# Patient Record
Sex: Female | Born: 1945 | Race: Black or African American | Hispanic: No | Smoking: Never smoker
Health system: Southern US, Community
[De-identification: ages and names within clinical notes are randomized; demographics above are authoritative.]

## PROBLEM LIST (undated history)

## (undated) DIAGNOSIS — J45909 Unspecified asthma, uncomplicated: Secondary | ICD-10-CM

## (undated) DIAGNOSIS — N184 Chronic kidney disease, stage 4 (severe): Secondary | ICD-10-CM

## (undated) DIAGNOSIS — J019 Acute sinusitis, unspecified: Secondary | ICD-10-CM

## (undated) DIAGNOSIS — I10 Essential (primary) hypertension: Secondary | ICD-10-CM

## (undated) DIAGNOSIS — M1990 Unspecified osteoarthritis, unspecified site: Secondary | ICD-10-CM

## (undated) DIAGNOSIS — I214 Non-ST elevation (NSTEMI) myocardial infarction: Secondary | ICD-10-CM

## (undated) DIAGNOSIS — J309 Allergic rhinitis, unspecified: Secondary | ICD-10-CM

## (undated) DIAGNOSIS — E119 Type 2 diabetes mellitus without complications: Secondary | ICD-10-CM

## (undated) DIAGNOSIS — K219 Gastro-esophageal reflux disease without esophagitis: Secondary | ICD-10-CM

## (undated) DIAGNOSIS — K760 Fatty (change of) liver, not elsewhere classified: Secondary | ICD-10-CM

## (undated) HISTORY — DX: Gastro-esophageal reflux disease without esophagitis: K21.9

## (undated) HISTORY — PX: ABDOMINAL HYSTERECTOMY: SHX81

## (undated) HISTORY — PX: HAND SURGERY: SHX662

## (undated) HISTORY — PX: BACK SURGERY: SHX140

## (undated) HISTORY — PX: BREAST SURGERY: SHX581

## (undated) HISTORY — DX: Acute sinusitis, unspecified: J01.90

## (undated) HISTORY — DX: Unspecified asthma, uncomplicated: J45.909

## (undated) HISTORY — DX: Allergic rhinitis, unspecified: J30.9

---

## 1998-07-31 ENCOUNTER — Other Ambulatory Visit: Admission: RE | Admit: 1998-07-31 | Discharge: 1998-07-31 | Payer: Self-pay | Admitting: *Deleted

## 1999-03-01 ENCOUNTER — Emergency Department (HOSPITAL_COMMUNITY): Admission: EM | Admit: 1999-03-01 | Discharge: 1999-03-02 | Payer: Self-pay | Admitting: Emergency Medicine

## 1999-06-23 ENCOUNTER — Other Ambulatory Visit: Admission: RE | Admit: 1999-06-23 | Discharge: 1999-06-23 | Payer: Self-pay | Admitting: *Deleted

## 1999-07-13 ENCOUNTER — Encounter: Admission: RE | Admit: 1999-07-13 | Discharge: 1999-07-13 | Payer: Self-pay | Admitting: Family Medicine

## 1999-07-13 ENCOUNTER — Encounter: Payer: Self-pay | Admitting: Family Medicine

## 1999-12-21 ENCOUNTER — Ambulatory Visit (HOSPITAL_COMMUNITY): Admission: RE | Admit: 1999-12-21 | Discharge: 1999-12-21 | Payer: Self-pay | Admitting: Neurosurgery

## 1999-12-21 ENCOUNTER — Encounter: Payer: Self-pay | Admitting: Neurosurgery

## 2000-07-14 ENCOUNTER — Other Ambulatory Visit: Admission: RE | Admit: 2000-07-14 | Discharge: 2000-07-14 | Payer: Self-pay | Admitting: *Deleted

## 2000-07-18 ENCOUNTER — Other Ambulatory Visit: Admission: RE | Admit: 2000-07-18 | Discharge: 2000-07-18 | Payer: Self-pay | Admitting: Orthopedic Surgery

## 2001-01-20 ENCOUNTER — Inpatient Hospital Stay (HOSPITAL_COMMUNITY): Admission: AD | Admit: 2001-01-20 | Discharge: 2001-01-22 | Payer: Self-pay | Admitting: *Deleted

## 2001-02-20 ENCOUNTER — Encounter: Admission: RE | Admit: 2001-02-20 | Discharge: 2001-05-21 | Payer: Self-pay | Admitting: Family Medicine

## 2002-03-28 ENCOUNTER — Emergency Department (HOSPITAL_COMMUNITY): Admission: EM | Admit: 2002-03-28 | Discharge: 2002-03-28 | Payer: Self-pay | Admitting: Emergency Medicine

## 2002-07-05 ENCOUNTER — Encounter: Admission: RE | Admit: 2002-07-05 | Discharge: 2002-10-03 | Payer: Self-pay | Admitting: Family Medicine

## 2002-07-24 ENCOUNTER — Encounter: Admission: RE | Admit: 2002-07-24 | Discharge: 2002-07-24 | Payer: Self-pay | Admitting: Neurosurgery

## 2002-07-24 ENCOUNTER — Encounter: Payer: Self-pay | Admitting: Neurosurgery

## 2003-03-21 ENCOUNTER — Emergency Department (HOSPITAL_COMMUNITY): Admission: EM | Admit: 2003-03-21 | Discharge: 2003-03-22 | Payer: Self-pay | Admitting: Emergency Medicine

## 2003-07-08 ENCOUNTER — Ambulatory Visit (HOSPITAL_COMMUNITY): Admission: RE | Admit: 2003-07-08 | Discharge: 2003-07-08 | Payer: Self-pay | Admitting: Specialist

## 2003-07-08 ENCOUNTER — Encounter (INDEPENDENT_AMBULATORY_CARE_PROVIDER_SITE_OTHER): Payer: Self-pay | Admitting: *Deleted

## 2003-07-08 ENCOUNTER — Ambulatory Visit (HOSPITAL_BASED_OUTPATIENT_CLINIC_OR_DEPARTMENT_OTHER): Admission: RE | Admit: 2003-07-08 | Discharge: 2003-07-08 | Payer: Self-pay | Admitting: Specialist

## 2004-02-02 HISTORY — PX: BREAST REDUCTION SURGERY: SHX8

## 2004-04-30 ENCOUNTER — Ambulatory Visit: Payer: Self-pay | Admitting: Pulmonary Disease

## 2004-11-23 ENCOUNTER — Encounter: Admission: RE | Admit: 2004-11-23 | Discharge: 2004-11-23 | Payer: Self-pay | Admitting: *Deleted

## 2004-11-27 ENCOUNTER — Ambulatory Visit (HOSPITAL_COMMUNITY): Admission: RE | Admit: 2004-11-27 | Discharge: 2004-11-27 | Payer: Self-pay | Admitting: *Deleted

## 2004-11-27 ENCOUNTER — Ambulatory Visit (HOSPITAL_BASED_OUTPATIENT_CLINIC_OR_DEPARTMENT_OTHER): Admission: RE | Admit: 2004-11-27 | Discharge: 2004-11-27 | Payer: Self-pay | Admitting: *Deleted

## 2004-11-27 ENCOUNTER — Encounter (INDEPENDENT_AMBULATORY_CARE_PROVIDER_SITE_OTHER): Payer: Self-pay | Admitting: *Deleted

## 2005-05-18 ENCOUNTER — Ambulatory Visit: Payer: Self-pay | Admitting: Internal Medicine

## 2005-06-02 ENCOUNTER — Ambulatory Visit: Payer: Self-pay | Admitting: Internal Medicine

## 2005-07-19 ENCOUNTER — Ambulatory Visit: Payer: Self-pay | Admitting: Internal Medicine

## 2005-08-12 ENCOUNTER — Ambulatory Visit: Payer: Self-pay | Admitting: Internal Medicine

## 2005-11-16 ENCOUNTER — Ambulatory Visit: Payer: Self-pay | Admitting: Internal Medicine

## 2006-02-07 ENCOUNTER — Ambulatory Visit: Payer: Self-pay | Admitting: Internal Medicine

## 2006-04-26 ENCOUNTER — Ambulatory Visit: Payer: Self-pay | Admitting: Internal Medicine

## 2006-05-09 ENCOUNTER — Ambulatory Visit: Payer: Self-pay | Admitting: Internal Medicine

## 2006-05-12 ENCOUNTER — Inpatient Hospital Stay (HOSPITAL_COMMUNITY): Admission: EM | Admit: 2006-05-12 | Discharge: 2006-05-18 | Payer: Self-pay | Admitting: Emergency Medicine

## 2006-06-13 ENCOUNTER — Ambulatory Visit: Payer: Self-pay | Admitting: Internal Medicine

## 2006-11-22 ENCOUNTER — Ambulatory Visit: Payer: Self-pay | Admitting: Pulmonary Disease

## 2006-12-26 ENCOUNTER — Ambulatory Visit: Payer: Self-pay | Admitting: Internal Medicine

## 2007-02-23 DIAGNOSIS — E119 Type 2 diabetes mellitus without complications: Secondary | ICD-10-CM

## 2007-02-23 DIAGNOSIS — J309 Allergic rhinitis, unspecified: Secondary | ICD-10-CM | POA: Insufficient documentation

## 2007-02-23 DIAGNOSIS — K219 Gastro-esophageal reflux disease without esophagitis: Secondary | ICD-10-CM

## 2007-02-23 DIAGNOSIS — Z794 Long term (current) use of insulin: Secondary | ICD-10-CM

## 2007-02-23 DIAGNOSIS — J45909 Unspecified asthma, uncomplicated: Secondary | ICD-10-CM | POA: Insufficient documentation

## 2007-02-24 ENCOUNTER — Ambulatory Visit: Payer: Self-pay | Admitting: Internal Medicine

## 2007-04-24 ENCOUNTER — Ambulatory Visit: Payer: Self-pay | Admitting: Internal Medicine

## 2007-05-05 ENCOUNTER — Ambulatory Visit: Payer: Self-pay | Admitting: Internal Medicine

## 2007-05-10 ENCOUNTER — Telehealth: Payer: Self-pay | Admitting: Internal Medicine

## 2007-07-13 ENCOUNTER — Emergency Department (HOSPITAL_COMMUNITY): Admission: EM | Admit: 2007-07-13 | Discharge: 2007-07-13 | Payer: Self-pay | Admitting: Emergency Medicine

## 2008-02-06 ENCOUNTER — Encounter: Admission: RE | Admit: 2008-02-06 | Discharge: 2008-02-06 | Payer: Self-pay | Admitting: Endocrinology

## 2008-04-01 ENCOUNTER — Ambulatory Visit: Payer: Self-pay | Admitting: Internal Medicine

## 2008-04-01 DIAGNOSIS — J019 Acute sinusitis, unspecified: Secondary | ICD-10-CM | POA: Insufficient documentation

## 2008-05-31 ENCOUNTER — Ambulatory Visit: Payer: Self-pay | Admitting: Internal Medicine

## 2008-06-04 ENCOUNTER — Encounter: Admission: RE | Admit: 2008-06-04 | Discharge: 2008-06-04 | Payer: Self-pay | Admitting: Orthopedic Surgery

## 2008-06-19 ENCOUNTER — Encounter: Admission: RE | Admit: 2008-06-19 | Discharge: 2008-06-19 | Payer: Self-pay | Admitting: Neurosurgery

## 2008-12-05 ENCOUNTER — Ambulatory Visit: Payer: Self-pay | Admitting: Internal Medicine

## 2009-04-26 ENCOUNTER — Emergency Department (HOSPITAL_COMMUNITY): Admission: EM | Admit: 2009-04-26 | Discharge: 2009-04-26 | Payer: Self-pay | Admitting: Emergency Medicine

## 2010-04-24 LAB — POCT I-STAT, CHEM 8
Calcium, Ion: 1.22 mmol/L (ref 1.12–1.32)
Creatinine, Ser: 1.4 mg/dL — ABNORMAL HIGH (ref 0.4–1.2)
Glucose, Bld: 105 mg/dL — ABNORMAL HIGH (ref 70–99)
HCT: 40 % (ref 36.0–46.0)
Hemoglobin: 13.6 g/dL (ref 12.0–15.0)
TCO2: 30 mmol/L (ref 0–100)

## 2010-04-24 LAB — URINALYSIS, ROUTINE W REFLEX MICROSCOPIC
Bilirubin Urine: NEGATIVE
Ketones, ur: 15 mg/dL — AB
Nitrite: NEGATIVE
Protein, ur: NEGATIVE mg/dL
Specific Gravity, Urine: 1.029 (ref 1.005–1.030)
Urobilinogen, UA: 1 mg/dL (ref 0.0–1.0)

## 2010-06-16 NOTE — Assessment & Plan Note (Signed)
[facility]                             PULMONARY OFFICE NOTE   Heidi, Campbell                       MRN:          [id number]  DATE:06/13/2006                            DOB:          1945-08-26    PROBLEMS:  1. Asthma.  2. Allergic rhinitis.  3. Diabetes.   HISTORY:  She had to be hospitalized on April 10th through 16th by the  [facility] hospitalists for exacerbation of asthma and uncontrolled  diabetes.  She was discharged on a prednisone taper, Avelox 400 mg for 5  days, albuterol/Advair 250/50, Cardizem 300 mg, Glucophage 500 mg  b.i.d., Amaryl 4 mg, Lantus 25 units b.i.d., NovoLog 4 units with meals  t.i.d., aspirin 81 mg.  We have no medication allergies listed.   Chest x-ray from April 10 showed no acute disease.   She says she is still somewhat short of breath with exertion, as she has  finished her prednisone taper.  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   OBJECTIVE:  VITAL SIGNS:  Weight 296 pounds.  BP 156/86, pulse 66, room  air saturation 100%.  GENERAL:  She is obese.  CARDIAC:  Heart sounds are regular without murmur.  LUNGS:  Lung fields sound quite clear but with deep breath, she started  coughing.  There was no edema.   IMPRESSION:  Asthma exacerbation, unstable.   PLAN:  1. We are giving her a sample of Advair/500/50 to use for two weeks,      then drop back to her 250/50.  2. Reflux is a possible trigger for her asthma, and we added Zegerid      40/1100 1 daily.  3. Scheduling return in one month but earlier p.r.n.  4. We have discussed acute care and asthma action management.     Clinton D. Maple Hudson, MD, FCCP, FACP     CDY/MedQ  DD: 06/16/2006  DT: 06/17/2006  Job #: [id number]   cc:   Gretta Arab. Valentina Lucks, M.D.

## 2010-06-16 NOTE — Assessment & Plan Note (Signed)
[facility]                             PULMONARY OFFICE NOTE   Heidi Campbell, Heidi Campbell                       MRN:          [id number]  DATE:11/22/2006                            DOB:          02-Aug-1945    HISTORY OF PRESENT ILLNESS:  The patient is a pleasant female patient of  Dr. Roxy Cedar who has a know history of asthma and allergic rhinitis who  presents today for a 3-day history of dry cough, wheezing, hoarseness  and sore throat.  The patient denies any hemoptysis, orthopnea, PND,  purulent sputum.   PAST MEDICAL HISTORY:  Reviewed.   CURRENT MEDICATIONS:  Reviewed.   PHYSICAL EXAMINATION:  GENERAL:  The patient is a pleasant female in no  acute distress.  VITAL SIGNS:  Temperature 99.5, blood pressure 152/84, O2 saturation 99%  on room air.  HEENT:  Negative mucosa.  Nontender sinuses.  Posterior pharynx is  clear.  NECK:  Supple without adenopathy.  Negative nuchal rigidity.  LUNGS:  Lung sounds reveal coarse breath sounds without any wheezing.  The patient does have some upper airway pseudowheezing.  CARDIAC:  Regular rate.  ABDOMEN:  Morbid obesity, soft and nontender.  EXTREMITIES:  Warm without clubbing, cyanosis, or edema.  SKIN:  Warm without rash.   IMPRESSION AND PLAN:  Acute asthmatic bronchitic exacerbation and  rhinitis flare.  The patient is to begin Mucinex DM twice daily.  Xopenex nebulized treatment was given today in the office.  Tussionex,  #4 ounces, one teaspoon every 12 hours as needed for cough.  The patient  aware of sedating affect.  The patient has been recommended to use  Allegra twice daily as needed for postnasal drip symptoms and is to  begin Prilosec 20 mg daily x2 weeks for any residual reflux that could  be irritating the airways.  The patient will follow back up with Dr.  Maple Hudson in 1 month as scheduled, or sooner if needed.  The patient is  advised to contact our office if symptoms do not improve or  worsen.      Rubye Oaks, NP  Electronically Signed      Clinton D. Maple Hudson, MD, Tonny Bollman, FACP  Electronically Signed   TP/MedQ  DD: 11/22/2006  DT: 11/22/2006  Job #: [id number]

## 2010-06-16 NOTE — Assessment & Plan Note (Signed)
[facility]                             PULMONARY OFFICE NOTE   Heidi Campbell, Heidi Campbell                       MRN:          [id number]  DATE:12/26/2006                            DOB:          Dec 04, 1945    PROBLEMS:  1. Asthma.  2. Allergic rhinitis.  3. Diabetes.  4. Esophageal reflux.   HISTORY:  She saw the nurse practitioner on October 21 with an acute  asthmatic bronchitis and rhinitis flare, and she has felt much better  since that visit. She thinks that she is doing well. She is continuing  to use Advair 500/50, except that she notices a little dry cough,  sometimes getting a bit raspy. She has had flu vaccine.   MEDICATIONS:  We reviewed her medication list, which is charted.  Significant now for:  1. Advair 500/50.  2. Singulair 10 mg.  3. Albuterol rescue inhaler.  4. Allegra 60 mg occasional.   ALLERGIES:  No medication allergy.   OBJECTIVE:  Blood pressure 188/100 on first check, 172/94 on second  check. Pulse regular 59, room air saturation 100%. Throat tickle type  cough demonstrated. No strider. Quiet chest without wheeze, rales, or  rhonchi. Heart sounds regular without murmur. Moderate nasal congestion  with no post-nasal drip. No edema. No adenopathy.   IMPRESSION:  Dry residual cough may still reflect some asthmatic  bronchitis. We are not going to add more medicines for this currently.  She does have a persistent rhinitis and we are going to try a nasal  antihistamine spray. I expressed concern about her blood pressure and  directed her to have that evaluated by her primary care physician.   PLAN:  1. She was given reminder instruction on the use of an albuterol      rescue inhaler.  2. Continue Advair 500/50 with careful mouth care.  3. Try Patinase nasal spray, once or twice in each nostril, b.i.d.      p.r.n.  4. Schedule return 6 months, earlier p.r.n.    Clinton D. Maple Hudson, MD, Tonny Bollman, FACP  Electronically  Signed   CDY/MedQ  DD: 01/01/2007  DT: 01/02/2007  Job #: [id number]   cc:   Gretta Arab. Valentina Lucks, M.D.

## 2010-06-19 NOTE — Op Note (Signed)
NAME:  Heidi Campbell, Heidi Campbell                ACCOUNT NO.:  [id number]   MEDICAL RECORD NO.:  [id number]          PATIENT TYPE:  AMB   LOCATION:  [facility]                         FACILITY:  [facility]   PHYSICIAN:  Pershing Cox, M.D.DATE OF BIRTH:  08-18-1945   DATE OF PROCEDURE:  11/27/2004  DATE OF DISCHARGE:                                 OPERATIVE REPORT   PREOPERATIVE DIAGNOSIS:  Vulvar intraepithelial neoplasia, high-grade.   POSTOPERATIVE DIAGNOSIS:  Vulvar intraepithelial neoplasia, high-grade.   PROCEDURE:  Right hemivulvectomy, wide local excision of vulvar lesion with  primary closure.   SURGEON:  Pershing Cox, M.D.   ANESTHESIA:  General with LMA and Marcaine 0.5% local.   ASSISTANT:  No assistant for this case.   HISTORY:  Heidi Campbell was referred by Dr. Campbell Stall for a vulvar biopsy  showing high grade dysplasia of vulvar CIS. Because of a family medical  problem, her surgery was delayed and it has been subsequently delayed  because of poorly controlled diabetes. She is now brought to the operating  room today for wide local excision. Her diabetes has been in better control  and is followed by Dr. Maurice Small. The patient has been monitoring her  glucose levels and her fastings have been and somewhere between 80 and 90  every morning. Prior to this procedure, she was counseled regarding the  risks of the procedure which are primarily infectious and wound break down.  In preparation for the surgery, she was to attempt bowel clean out which was  unsuccessful.   OPERATIVE FINDINGS:  The lesions of the vulva to be excised were on the  right inferior portion of the labia and extending onto the mid fourchette.   PROCEDURE:  Heidi Campbell was brought to the operating room with an IV in  place. She received a gram of Ancef in the holding area and then was taken  to the operating room where supine on the OR table a general anesthesia was  established by IV sedation and then  placement of the LMA. She was placed  into Allen stirrups and positioned comfortably for surgery. Betadine was  used to prep the perineum, vagina and vulva. Sterile linens were used then  to drape the body for a vulvar procedure. The noted lesions were outlined.  Using a marking pen and a ruler, measurements 1 cm in all directions from  this lesion were noted and then an incision was inscribed along these  markings. A 0.5% Marcaine was instilled into the skin prior to the excision  of the lesion. A scalpel blade was used to incise the marked skin incision.  Once I was deep into the dermis, the Bovie cautery was used to incise and  excise the lesion taking it down to a depth of approximately 1/2 cm beneath  the lesion.   Once the lesion had been excised, it was marked with sutures to be later  submitted for permanent section to pathology. Bovie cautery was used to  obtain hemostasis. This was impossible in some circumstances and therefore 3-  0 Vicryl sutures were used to  ligate some of the areas in the base of the  excision that would not stop bleeding with cautery. Skin edges on the vulvar  side and the vaginal side were undermined. A Penrose drain was placed  through a stab incision just lateral to the right labia. This was later  secured in place with 4-0 nylon. It was layered into the base and then #0  Vicryl sutures were used to close the subcutaneous tissues. Interrupted  mattress sutures of 4-0 Vicryl were used to bring the volar skin to the  vaginal wall. Where appropriate, 4-0 Vicryl sutures were used as simple  sutures to close the skin edges.   The patient tolerated the procedure well. She was taken to the recovery room  with the Penrose drain in place. She was later discharged to home and will  seen in my office on Monday to remove the Penrose drain.      Pershing Cox, M.D.  Electronically Signed     MAJ/MEDQ  D:  11/27/2004  T:  11/27/2004  Job:  [id number]   cc:    Gretta Arab. Valentina Lucks, M.D.  Fax: [phone]   Hope M. Danella Deis, M.D.  Fax: [phone]

## 2010-06-19 NOTE — Assessment & Plan Note (Signed)
[facility]                             PULMONARY OFFICE NOTE   Heidi Campbell, Heidi Campbell                       MRN:          [id number]  DATE:04/26/2006                            DOB:          08/08/1945    PROBLEM:  1. Asthma.  2. Allergic rhinitis.  3. Diabetes.   HISTORY:  Asthma flare over the weekend with headache, head and chest  congestion, yellow sputum, cough, makes ribs sore.  She had brief fever  over the weekend.  She wants cortisone and we discussed this.   MEDICATION:  Her list was reviewed noting Advair 100/50, albuterol  rescue inhaler, and Allegra 60 mg b.i.d.   OBJECTIVE:  Weight 245 pounds, BP 182/96, pulse regular 65, room air  saturation 97%.  There is fairly significant turbinate edema bilaterally with a lot of  white mucous and some mucoid postnasal drip.  Pharynx is not red.  Congested wheeze bilaterally.  Heart sounds regular without murmur.   IMPRESSION:  Exacerbation of asthma and rhinitis.   PLAN:  Nebulizer Xopenex 1.25 mg, Depo-Medrol 80 mg IM, prednisone 8 day  taper from 40 mg with a steroid talk, particularly in the context of her  diabetes, Z-pak.  Schedule return 2 months, earlier p.r.n. as discussed.     Clinton D. Maple Hudson, MD, Tonny Bollman, FACP  Electronically Signed    CDY/MedQ  DD: 04/30/2006  DT: 04/30/2006  Job #: [id number]   cc:   Gretta Arab. Valentina Lucks, M.D.

## 2010-06-19 NOTE — Op Note (Signed)
[place]  Patient:    Heidi Campbell, Heidi Campbell                       MRN: [id number] Proc. Date: 12/21/99 Adm. 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  OPERATION PERFORMED:  Right L5-S1 laminotomy and microdiskectomy.  SURGEON:  Julio Sicks, M.D.  ASSISTANT:  Reinaldo Meeker, M.D.  ANESTHESIA:  General endotracheal.  INDICATIONS FOR PROCEDURE:  [name] is a 65 year old black female with severe back and right lower extremity pain with paresthesias and weakness cnow a right-sided S1 radiculopathy which has failed efforts at conservative management.  MRI scan demonstrates a very large right-sided L5-S1 disk herniation with severe compression of the thecal sac and exiting S1 nerve root.  The patient has been counseled as to her options.  She has decided to proceed with a laminotomy and microdiskectomy for hopeful relief of her symptoms.  DESCRIPTION OF PROCEDURE:  The patient was taken to the operating room and placed on the table in supine position.  After an adequate level of anesthesia was achieved, the patient was positioned prone onto a Wilson frame and appropriately padded.  The patients lumbar region was shaved and prepped sterilely.  A #10 blade was used to make a linear skin incision overlying the L5-S1 interspace.  This was carried down sharply in the midline. Subperiosteal dissection was then performed on the right side  exposing the lamina of the facet joints of L5 and S1.  Deep self-retaining retractor was placed.  X-ray was taken and the level was confirmed.  A laminotomy was performed using a high speed drill and Kerrison rongeurs removing the inferior one half of the lamina of L5, the medial edge of the L5-S1  facet joint and superior rim of the S1 lamina.  The ligamentum flavum was then elevated and resected in piecemeal fashion using Kerrison rongeur for ____________ thecal sac and exiting S1 nerve root were identified.  Microscope was brought into the field and used for microdissection of the thecal sac and underlying disk herniation.  The epidural venous plexus was coagulated and cut.  The thecal sac and S1 nerve root were gently mobilized toward the midline using a DErrico nerve root retractor.  A blunt nerve hook was then used to free up a very large free fragment disk herniation.  This was then removed using pituitary rongeurs.  The disk space was isolated, incised with a 15 blade in rectangular fashion.  A wide disk space cleanout was then achieved using pituitary rongeurs, upward angled pituitary rongeurs and Epstein curets.  All loose or obviously degenerated disk material was removed from the interspace. All elements of the disk herniation were completely removed.  The spinal canal was inspected.  There was no evidence of injury to thecal sac or nerve roots. There was no evidence of any continued compression.  The wound was then copiously irrigated with antibiotic solution.  Gelfoam was placed topically for hemostasis which was found to be good.  Microscope and retractor system were removed.  Hemostasis in the muscle was achieved with electrocautery.  The wound was then closed in layers with Vicryl sutures.  Staples applied to  the surface.  There were no apparent complications.  The patient tolerated the procedure well and she returned to the recovery room postoperatively. DD:  12/21/99 TD:  12/21/99 Job: [id number]

## 2010-06-19 NOTE — Assessment & Plan Note (Signed)
[facility]                             PULMONARY OFFICE NOTE   MONALISA, BAYLESS                       MRN:          [id number]  DATE:02/07/2006                            DOB:          07-Aug-1945    PROBLEM:  1. Asthma.  2. Allergic rhinitis.  3. Diabetes.   HISTORY:  Early return.  She has gotten with illness which began as a  sore throat and wheeze and has progressed rapidly to head and chest  congestion.  She has been using all of her meds.  Nothing purulent, no  fever.   MEDICATIONS:  1. Glucophage 500 mg b.i.d.  2. Amaryl 4 mg.  3. Cardizem 300 mg b.i.d.  4. Insulin.  5. Aspirin.  6. Advair 100/50.  7. Albuterol rescue inhaler.  8. Allegra 60 mg.   No medication allergy.   OBJECTIVE:  VITAL SIGNS:  Weight 249 pounds, blood pressure 152/100,  pulse regular 67, room air saturation 99%.  CHEST:  Moderately tight wheeze diffusely, no accessory muscle use.  NECK:  No neck vein distension or edema.  ABDOMEN:  She is quite obese.  HEART:  Heart sounds regular without murmur.   IMPRESSION:  Acute exacerbation of asthma, most likely viral.   PLAN:  1. Nebulizer, albuterol.  2. Depo-Medrol 80 mg IM.  3. Prednisone 8 day taper from 40 mg with steroid talk particularly in      the context of her diabetes.  She will hold this and use it if she      fails to dramatically improve overnight with our initial therapy.  4. She has an appointment in April and will keep that, unless she      fails to clear and needs to come in sooner.     Clinton D. Maple Hudson, MD, Tonny Bollman, FACP  Electronically Signed    CDY/MedQ  DD: 02/07/2006  DT: 02/08/2006  Job #: [id number]   cc:   Gretta Arab. Valentina Lucks, M.D.

## 2010-06-19 NOTE — Assessment & Plan Note (Signed)
[facility]                               PULMONARY OFFICE NOTE   Heidi Campbell, Heidi Campbell                       MRN:          [id number]  DATE:11/16/2005                            DOB:          March 04, 1945    PULMONARY FOLLOW-UP:   PROBLEMS:  1. Asthma.  2. Allergic rhinitis.  3. Diabetes.   HISTORY:  She says she has been fine since last year in midsummer.  Just at  the end of September she had  definite viral-type cold which she was able to  shake off, and since then she has done well.  She does keep a little dry  sniffle.  She takes her Advair on a regular basis, not knowing how she would  do without it.   MEDICATIONS:  1. Glucophage 500 mg b.i.d.  2. Amaryl 4 mg.  3. Cardizem 300 mg b.i.d.  4. Lantus 15 units b.i.d.  5. Aspirin.  6. Advair 100/50 mg b.i.d.  7. Rescue albuterol inhaler.  8. Allegra 60 mg b.i.d.   No medication allergy.   OBJECTIVE:  VITAL SIGNS:  Weight 239 pounds.  BP 118/72, pulse rate was 60,  room air saturation 100%.  GENERAL:  She does have a little sniffle.  HEENT:  Turbinates are a little pale but not edematous, and I do not see any  evidence of drainage or excessive mucus.  Conjunctivae are clear.  NECK:  There is no neck vein distention or stridor.  LUNGS:  Clear with no wheeze or rales.  CARDIAC:  Heart sounds are regular without murmur or gallop.   IMPRESSION:  1. Allergic rhinitis and residual from a viral upper respiratory      infection.  2. Mild asthma, well-controlled.   PLAN:  1. She is stable.  We had stopped her allergy vaccine 2 years ago and see      no reason to retest her at this point.  2. I would like her to try a nasal steroid spray, and she is given a      prescription for fluticasone once each nostril daily.  3. Saline nasal spray.  4. Schedule return 6 months, earlier p.r.n.       Clinton D. Maple Hudson, MD, FCCP, FACP      CDY/MedQ  DD:  11/20/2005  DT:  11/22/2005  Job #:   [id number]   cc:   Gretta Arab. Valentina Lucks, M.D.

## 2010-06-19 NOTE — Op Note (Signed)
NAME:  Heidi Campbell, Heidi Campbell                          ACCOUNT NO.:  [id number]   MEDICAL RECORD NO.:  [id number]                   PATIENT TYPE:  AMB   LOCATION:  [facility]                                  FACILITY:  [facility]   PHYSICIAN:  Earvin Hansen L. Shon Hough, M.D.           DATE OF BIRTH:  1945-12-28   DATE OF PROCEDURE:  07/08/2003  DATE OF DISCHARGE:                                 OPERATIVE REPORT   INDICATIONS FOR PROCEDURE:  65 year old lady with severe, severe, severe  macromastia, back and shoulder pain secondary to large, pendulous breasts,  increased intertriginous change requiring heat/cold ointment, salve, talc,  whatever the patient can use to give her relief during this time of month,  she wears a 48 triple D to E to F bra.  The patient now is being prepared  for bilateral breast reduction.  All conservative treatments have been  exhausted with no avail.   PROCEDURE PERFORMED:  Bilateral breast reductions using V inferior pedicle  technique.   ANESTHESIA:  General supplemented by tumescent solution.   The patient was sat up and drawn for the inferior pedicle reduction  mammoplasty, redrawn back to 23 cm from the suprasternal notch from 44.  She  then underwent general anesthesia, intubated orally.  Prep was done to the  chest and breast areas in a routine fashion using Betadine soaping solution  and walled off with sterile towels and drapes so as to make a sterile field.  Tumescent was injected into each breast, 500 mL per side.  The wounds were  scored with #15 blade and then the skin over the inferior pedicle was de-  epithelized with a #20 blade.  Hemostasis was maintained with the Bovie and  coagulation of small spot bleeders.  Next, after proper hemostasis, the  medial and lateral fatty dermal pedicles were excised down to underlying  fascia.  The new keyhole area was also debulked and after proper hemostasis,  the flaps were transposed after excess accessory breast tissue was  removed  laterally at the latissimus dorsi area and the upper axillary regions.  Again, hemostasis was maintained with the Bovie coagulation.  The fascia was  closed with 3-0 Prolene suture, subcutaneous closure was done with 3-0  Monocryl x 2 layers, then a running subcuticular stitch of 3-0 Monocryl and  5-0 Monocryl throughout the inverted T.  The wounds were drained with #10  Blake drains, flat, which were placed into the depths of the wound and  brought out through the lateral most portion of the incision and secured  with 3-0 Prolene.  The wounds were cleansed.  1/2 inch Steri-Strips and soft  dressings were applied including Xeroform, 4 by 4s, ABDs, and Hypofix tape.  She tolerated all the procedures very well.  Estimated blood loss less than  150 mL.  Complications were none.  She was then taken to the recovery room  in excellent condition.  Postoperative orders  as withstanding.                                               Yaakov Guthrie. Shon Hough, M.D.    Cathie Hoops  D:  07/08/2003  T:  07/08/2003  Job:  [id number]

## 2010-06-19 NOTE — H&P (Signed)
Heidi Campbell, Heidi Campbell NO.:  [id number]   MEDICAL RECORD NO.:  [id number]          PATIENT TYPE:  INP   LOCATION:  2018                         FACILITY:  [facility]   PHYSICIAN:  Lucita Ferrara, MD         DATE OF BIRTH:  [birth year]   DATE OF ADMISSION:  05/12/2006  DATE OF DISCHARGE:                              HISTORY & PHYSICAL   The patient is a 65 year old female who presents with progressive  shortness of breath, wheezing, cough.  That has been going on now for  the last week.  The patient has been treated with albuterol inhalers and  steroids with no relief.  The patient denied any cardiac-type chest pain  or increase in weight or peripheral edema.  Otherwise, review of systems  is negative.  She denies any focal neurological deficit.  She denies any  fevers or chills or leg swelling.  The patient also, of note, sees a  [facility].  Last visit was April 7, where  she has been on Z-Pak and nebulizer treatments.  Most recently she has  also been on Advair 250/50 mcg and Singulair given at the [facility] clinic.  The patient then was switched to Xopenex  nebulizer treatments and Depo-Medrol at that time and prednisone taper  and advised to come back in one month.  After that her course just  became progressive.  She had to go to the emergency room because of  intolerable shortness of breath.   PAST MEDICAL HISTORY:  1. Allergic rhinitis.  2. Type 2 diabetes.  3. Asthmatic bronchitis.   MEDICATIONS:  Currently the patient is on Amaryl, Cardizem, Glucophage  and insulin.   PHYSICAL EXAMINATION:  GENERAL:  The patient is in a moderate amount of  distress, breathing is labored.  She is not anxious.  VITAL SIGNS:  Pulse oximetry 96% on room air, blood pressure 168/88,  pulse 69.  HEENT:  Normocephalic, atraumatic.  Sclerae anicteric.  Mucous membranes  moist.  PERRLA.  Extraocular muscles intact.  NECK:  Supple with no  JVD, no carotid bruits.  CARDIOVASCULAR:  S1, S2, regular rate and rhythm, no murmurs, rubs or  clicks.  LUNGS:  Diffuse bilateral wheezes and rhonchi.  ABDOMEN:  Soft, nontender, nondistended.  EXTREMITIES:  No clubbing, cyanosis, or edema.  NEUROLOGIC:  Alert and oriented x3.  Cranial nerves II-XII grossly  intact.   LABORATORY DATA:  Sodium 130, potassium 2.9, chloride 104, BUN 28,  glucose is elevated at 419.  ABG shows pH 7.53, pCO2 25.3, bicarb 21.5,  and consistent with partially-compensated respiratory alkalosis.  White  count 14.1, hemoglobin 12.5, hematocrit 37.4, platelet count 251.  Glucose is high at 419.  Beta-natriuretic peptide is 33.  Chest x-ray  shows no active cardiopulmonary disease, no infiltrates, no  consolidation, no pleural edema.  No pneumothorax.   ASSESSMENT AND PLAN:  A 65 year old female with acute exacerbation of  her asthmatic bronchitis.  She is in moderate respiratory distress.  We  will go ahead and admit her to the telemetry floor  with a diagnosis of  asthmatic bronchitis so we can put her on oxygen via nasal cannula to  keep the pulse oximetry above 92%.  We will also give albuterol and  Atrovent q.4h. and q.6h. p.r.n.  Continue Solu-Medrol at 80 mg IV now  and then 60 mg q.8h. while watching very carefully for glycemic control  via sliding scale insulin at a moderate scale.  We will go ahead and do  CBGs q.2h., CBG 20 minutes after the first dose of Solu-Medrol, ABG in  one hour post Solu-Medrol.  Go ahead and cover her with broad-spectrum  antibiotics, Rocephin 1 g IV daily and Zithromax 500 mg IV daily.  We  will go ahead and continue her home medications, including Allegra.  Will hold her diabetic medications for now with exception of insulin.  Please clarify p.o. diabetic medications.  The patient has been  explained the plan and the procedures of admission, and the patient  understands.      Lucita Ferrara, MD  Electronically  Signed     RR/MEDQ  D:  05/13/2006  T:  05/13/2006  Job:  [id number]   cc:   Gretta Arab. Valentina Lucks, M.D.

## 2010-06-19 NOTE — Discharge Summary (Signed)
[place]  Patient:    Heidi Campbell, Heidi Campbell Visit Number: [id number] MRN: [id number]          Service Type: MED Location: [phone] Attending Physician:  Anastasio Auerbach Dictated by:   Anastasio Auerbach, M.D. Admit Date:  01/20/2001 Discharge Date: 01/22/2001   CC:         Gretta Arab. Valentina Lucks, M.D.  Clinton D. Maple Hudson, M.D.   Discharge Summary  DATE OF BIRTH:  2045/08/31  DISCHARGE DIAGNOSES:  1. Uncontrolled diabetes mellitus, diagnosed in 1993.     a. Glycohemoglobin this admission 12.8, TSH 2.463.     b. Started insulin therapy this admission.  2. Hypertension.  3. Hypercholesterolemia.     a. Total cholesterol 292, triglycerides 304, HDL 60, LDL 200 (01/13/01).  4. Diarrhea, resolved.     a. Secondary to metabolic dysfunction/severe hyperglycemia.  5. Dehydration, resolved.  6. Hypokalemia, improved.  7. Hypomagnesemia, replaced.  8. Asthma.     a. Poorly controlled, recently requiring steroids.  9. 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 10. Status post total abdominal hysterectomy in 1991. 11. Ganglion cyst excision, July 2002.  ALLERGIES:  No known drug allergies.  DISCHARGE MEDICATIONS: 1. (New) Insulin NPH 10 units q.a.m. with breakfast and 10 units q.p.m. with    supper. 2. (New) Aspirin 325 mg q.d. (to protect heart). 3. (New) Magnesium oxide 400 mg p.o. t.i.d. x 5 days, then stop. 4. (New) Potassium chloride 10 mEq q.d. x 5 days, then stop. 5. Continue previous medications as before (there is much discrepancy between    what the patient says she is taking and what is listed in the clinic    records.  I have done my best to correlate).  Cardizem CD 300 mg q.d. (the    patient said she was taking 500 mg b.i.d.).  Glucophage 850 mg p.o. t.i.d.    (the patient recently increased to this dose).  Prandin 2 mg p.o. t.i.d.    (the patient had only been taking b.i.d.).  Lipitor 40 mg b.i.d. (clinic    records indicate she is to be  taking this once daily).  Avandia 8 mg q.d.    Serevent metered dose inhaler 2 puffs b.i.d.  Flovent metered dose inhaler    2 puffs b.i.d.  * According to the clinic chart the patient was also supposed to be taking Prinzide 20/12.5 mg q.d., Diovan 80 mg q.d., and Cardura 8 mg q.d.  The patient says was told not to take these.  I will defer the addition of an ACE inhibitor to the primary care physician.  CONDITION ON DISCHARGE:  Stable.  DISPOSITION:  Home with family.  RECOMMENDED ACTIVITY:  As tolerated.  RECOMMENDED DIET:  An 1800 calorie, diabetic, low fat, low salt diet.  SPECIAL INSTRUCTIONS: 1. Check blood sugar b.i.d. fasting and keep a record for Dr. Valentina Lucks so she    can make insulin adjustments. 2. Contact Dr. Valentina Lucks if your sugar goes less than 60 or more than 400.  FOLLOWUP:  The patient is to contact Dr. Valentina Lucks tomorrow (Monday) to schedule an appointment for December 26 or 27.  At that visit, Dr. Valentina Lucks will be reviewing her blood sugars, reviewing all her medications, giving the patient an updated and accurate list, and considering putting her back on an ACE inhibitor.  She will also need a basic metabolic panel and an magnesium level checked, as I will be replacing them for the next 5 days.  CONSULTATIONS:  None.  PROCEDURES:  Chest x-ray:  No abnormality.  HOSPITAL COURSE:  #1 - DIARRHEA/DEHYDRATION:  Curtina Obeso had been having trouble with diarrhea for several days prior to presentation.  When she came in, she was visibly dry with tacky mucus membranes.  She was started on IV fluids.  She was unable to submit a stool sample, but her diarrhea stopped once IV fluids were started. I suspect that all this may have been due to her severe hyperglycemia.  #2 - SEVERE UNCONTROLLED DIABETES MELLITUS:  Rola Prabhakar had been on multiple drug therapies orally without significant improvement in her hyperglycemia. Over the past month she had been on several bursts  of prednisone, and this had only made matters worse.  She was diagnosed with diabetes in 1993.  We admitted her this time to get her started on insulin therapy.  She will go home on 10 units of NPH b.i.d.  If she complies with diet and exercise, she may be able to come off the insulin.  Her glycohemoglobin this admission was 12.8%.  Her TSH was normal at 2.463.  #3 - HYPOKALEMIA/HYPOMAGNESEMIA:  This is most likely due to dehydration. Given her pattern, I would expect that she was on a diuretic, but again the patient denied taking any Prinzide.  She received oral potassium.  Magnesium level was checked and was found to be low at 1.4.  She received 3 g of IV magnesium this hospitalization, and will go home on oral replacement.  She will only likely need temporary replacement if she is maintained off diuretic therapy.  #4 - HYPERTENSION:  Again in light of her diabetes, I think she does need to be on an ACE inhibitor.  It looks they have tried to put her on this, and Dr. Valentina Lucks will clear things up in followup.  #5 - ASTHMA:  The patient has had recent exacerbations, but everything was stable this admission.  Continue current inhalers.  #6 - HYPERLIPIDEMIA:  According to the patient she was recently increased to 40 mg b.i.d.  Lipid panel as described above was quite abnormal, but I think this will improve with better diabetes control.  DISCHARGE LABORATORY DATA:  Sodium 140, potassium 3.9, chloride 109, bicarbonate 26, BUN 9, creatinine 0.9, glucose 129.  Hemoglobin 12.6, MCV 82, white blood cell count 7500, platelet count 190.  Magnesium 1.4 (gave 3 g IV, and she will go home on oral replacement).  Urine culture 2000 insignificant growth.  Glycohemoglobin 12.8.  TSH 2.463.  I spent greater than 30 minutes arranging discharge and dictating. Dictated by:   Anastasio Auerbach, M.D. Attending Physician:  Anastasio Auerbach DD:  01/22/01  TD:  01/24/01 Job: [id number]

## 2010-06-19 NOTE — Discharge Summary (Signed)
Heidi Campbell, Heidi Campbell                ACCOUNT NO.:  [id number]   MEDICAL RECORD NO.:  [id number]          PATIENT TYPE:  INP   LOCATION:  5009                         FACILITY:  [facility]   PHYSICIAN:  Michelene Gardener, MD    DATE OF BIRTH:  03/20/1945   DATE OF ADMISSION:  05/12/2006  DATE OF DISCHARGE:  05/18/2006                               DISCHARGE SUMMARY   PRIMARY CARE PHYSICIAN:  Dr. Maurice Small.   DISCHARGE DIAGNOSES:  1. Acute exacerbation of asthma.  2. Hypoxemia.  3. Leukocytosis.  4. Uncontrolled diabetes.  5. Hypercholesterolemia.  6. Obesity.  7. Allergic rhinitis.   DISCHARGE MEDICATIONS:  1. Prednisone tapering dose.  2. Avelox 400 mg p.o. once daily for 5 days.  3. Albuterol two puffs q.4h. p.r.n.  4. Advair 250/50 one puff p.o. twice daily.  5. Cardizem 300 mg p.o. once daily.  6. Glucophage 500 mg p.o. twice daily.  7. Amaryl 4 mg p.o. once daily.  8. Lantus 25 units p.o. twice daily.  9. NovoLog 4 units with meals three times daily.  10.Aspirin 81 mg p.o. once daily.   CONSULTATIONS:  None.   PROCEDURES:  None.   DISCHARGE FOLLOWUP:  Followup appointment with Dr. Maurice Small in 1  week.   RADIOLOGY STUDIES:  Chest x-ray done on April 10 showed no acute  disease.   HOSPITAL COURSE:  1. Acute exacerbation of asthma.  This patient was admitted to the      floor.  Initially was started on IV Solu-Medrol and then her Solu-      Medrol was switched to p.o. prednisone.  She was also taking oxygen      by nasal cannula and that was tapered off.  The patient was finally      switched to room air on the day of discharge.  The patient was      continued on her albuterol and her Advair.  On discharge, was given      tapering dose of prednisone and Avelox to be taken for 5 more days      and her Advair and albuterol were continued.  2. Bronchitis.  The patient was given Avelox and this will be      continued for 5 more days following her discharge.  3.  Hypoxemia.  This is secondary to #1 and 2; and as mentioned above,      the patient was kept on oxygen and that was tapered off.  4. Uncontrolled diabetes.  The patient's Lantus was adjusted to 25      twice daily and her sugars remained uncontrolled, so short-acting      insulin which is NovoLog, was added to her medications and this to      be taken 4 units with meals.  5. Obesity.  The patient was advised about the risk.   ASSESSMENT TIME:  40 minutes.      Michelene Gardener, MD  Electronically Signed     NAE/MEDQ  D:  05/18/2006  T:  05/18/2006  Job:  [id number]   cc:   Consuella Lose  Emelia Salisbury, M.D.

## 2010-06-19 NOTE — Assessment & Plan Note (Signed)
[facility]                             PULMONARY OFFICE NOTE   Heidi Campbell, Heidi Campbell                       MRN:          [id number]  DATE:05/09/2006                            DOB:          [birth year]    PROBLEM:  1. Asthma.  2. Allergic rhinitis.  3. Diabetes.   HISTORY:  She blames allergy for a flare of asthma, beginning rather  quickly this weekend.  She was treated at [facility]-  In on Saturday and given prescriptions for Advair 250/50 and Singulair.  She said she cannot stop wheezing and coughing.  Throat has been a  little sore.  Sputum has been white or yellow.  She never cleared  completely after she was here March 25 when we gave her a nebulizer  treatment and prednisone taper with Z-Pak.  She had stopped allergy  vaccine 2 years ago when we discussed allergic versus viral triggers.   OBJECTIVE:  Weight 297 pounds.  BP 174/96, pulse regular 81.  Room air  saturation 99%.  Active wheeze and cough.  Rather tight but not labored.  No dullness.  HEART:  Sounds regular without murmur.  No neck vein distention or  edema.   IMPRESSION:  Exacerbation of asthma and probably of rhinitis as well.   PLAN:  Xopenex 1.25 mg neb treatment.  Depo-Medrol 80 mg IM.  Another  prednisone taper from 40 mg with careful steroid discussion.  She is  going to stay with Advair 250/50, up from 100/50 if she is going to stay  with Singulair 10 mg daily, along with her previous meds. Schedule  return in one month, but earlier p.r.n.     Clinton D. Maple Hudson, MD, Tonny Bollman, FACP  Electronically Signed    CDY/MedQ  DD: 05/09/2006  DT: 05/10/2006  Job #: [id number]   cc:   Gretta Arab. Valentina Lucks, M.D.

## 2010-09-04 ENCOUNTER — Inpatient Hospital Stay (HOSPITAL_BASED_OUTPATIENT_CLINIC_OR_DEPARTMENT_OTHER)
Admission: RE | Admit: 2010-09-04 | Discharge: 2010-09-04 | Disposition: A | Discharge status: Hospice | Payer: Medicare Other | Source: Ambulatory Visit | Attending: Interventional Cardiology | Admitting: Interventional Cardiology

## 2010-09-04 DIAGNOSIS — R9439 Abnormal result of other cardiovascular function study: Secondary | ICD-10-CM | POA: Insufficient documentation

## 2010-09-04 DIAGNOSIS — I2510 Atherosclerotic heart disease of native coronary artery without angina pectoris: Secondary | ICD-10-CM | POA: Insufficient documentation

## 2010-09-04 DIAGNOSIS — R0602 Shortness of breath: Secondary | ICD-10-CM | POA: Insufficient documentation

## 2010-09-28 NOTE — Cardiovascular Report (Signed)
NAME:  Heidi Campbell, Heidi Campbell NO.:  [id number]  MEDICAL RECORD NO.:  [id number]  LOCATION:                                 FACILITY:  PHYSICIAN:  Corky Crafts, MDDATE OF BIRTH:  [birth year]  DATE OF PROCEDURE:  09/04/2010 DATE OF DISCHARGE:                           CARDIAC CATHETERIZATION   PRIMARY CARE PHYSICIAN:  Gretta Arab. Valentina Lucks, MD  PROCEDURE PERFORMED:  Left heart catheterization, left ventriculogram, coronary angiogram, abdominal aortogram.  OPERATOR:  Corky Crafts, MD  INDICATIONS:  Mildly abnormal stress test, shortness of breath.  PROCEDURE NOTE:  The risks and benefits of cardiac catheterization were explained to the patient and informed consent obtained.  She was brought to the cath lab.  She was prepped and draped in usual sterile fashion. Her right groin was infiltrated with 1% lidocaine.  A 4-French sheath was placed into the right common femoral artery using modified Seldinger technique.  Left coronary artery angiography was performed using a JL-4 pigtail catheter.  The catheter was advanced to the vessel ostium under fluoroscopic guidance.  Digital angiography was performed in multiple projections using hand injection of contrast.  Right coronary artery angiography was performed using a JR-4 pigtail catheter in a similar fashion.  A pigtail catheter was advanced to the ascending aorta and across the aortic valve under fluoroscopic guidance.  Power injection of contrast performed in the RAO projection to image the left ventricle. Catheter was pulled back under continuous hemodynamic pressure monitoring.  Catheter was withdrawn to the abdominal aorta and a power injection of contrast was performed in the AP projection.  The sheath was removed and manual compression was used for hemostasis.  FINDINGS: 1. The left main was a large vessel and widely patent. 2. Left circumflex is a large vessel, had mild irregularities in the     mid  vessel.  There is an OM1 and OM-2 both of which were small, but     patent.  The OM III was a large vessel and widely patent. 3. Left anterior descending had a proximal stenosis, which was quite     eccentric in some views.  It looks like a very mild irregularity in     the cranial views that looked to be a 50% stenosis with some     calcium.  In the mid vessel, there was some mild irregularities.     There are 3 small diagonals.  The first and third diagonals were     small, but patent.  The second diagonal which was also small, had a     proximal 50% stenosis but this vessel did not supply significant     area of myocardium. 4. Right coronary artery is a medium-sized vessel, which is dominant.     There is mild-to-moderate diffuse disease.  Posterolateral artery     was small, but patent.  The PDA was medium-sized and patent. 5. Left ventriculogram showed normal left ventricular function.  The     estimated ejection fraction is 55%. 6. The abdominal aortogram showed no renal artery stenosis.  There is     no abdominal aortic aneurysm.  There is mild plaque seen in the  infrarenal aorta.  HEMODYNAMICS:  Ventricular pressure 176/11 with an LVEDP of 14 mmHg. Aortic pressure 175/67 with a mean aortic pressure of 107 mmHg.  IMPRESSION: 1. 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 2. Normal left ventricular function. 3. No abdominal aortic aneurysm or renal artery stenosis. 4. Normal hemodynamics.  RECOMMENDATIONS:  We will plan for aggressive medical therapy in this patient to help with her shortness of breath.  She has refractory symptoms, could consider IVUS of the proximal LAD.     Corky Crafts, MD     JSV/MEDQ  D:  09/04/2010  T:  09/04/2010  Job:  [id number]  Electronically Signed by Lance Muss MD on 09/28/2010 02:02:53 PM

## 2011-02-09 ENCOUNTER — Other Ambulatory Visit: Payer: Self-pay | Admitting: Orthopedic Surgery

## 2011-02-09 DIAGNOSIS — M545 Low back pain, unspecified: Secondary | ICD-10-CM

## 2011-02-15 ENCOUNTER — Other Ambulatory Visit: Payer: Medicare Other

## 2011-02-15 ENCOUNTER — Ambulatory Visit
Admission: RE | Admit: 2011-02-15 | Discharge: 2011-02-15 | Disposition: A | Discharge status: Home / Self Care | Payer: Medicare Other | Source: Ambulatory Visit | Attending: Orthopedic Surgery | Admitting: Orthopedic Surgery

## 2011-02-15 DIAGNOSIS — M545 Low back pain: Secondary | ICD-10-CM

## 2011-08-06 ENCOUNTER — Other Ambulatory Visit: Payer: Self-pay | Admitting: Family Medicine

## 2011-08-06 ENCOUNTER — Ambulatory Visit
Admission: RE | Admit: 2011-08-06 | Discharge: 2011-08-06 | Disposition: A | Discharge status: Home / Self Care | Payer: Medicare Other | Source: Ambulatory Visit | Attending: Family Medicine | Admitting: Family Medicine

## 2011-08-06 DIAGNOSIS — W19XXXA Unspecified fall, initial encounter: Secondary | ICD-10-CM

## 2011-12-04 ENCOUNTER — Emergency Department (HOSPITAL_COMMUNITY): Payer: Medicare Other

## 2011-12-04 ENCOUNTER — Emergency Department (INDEPENDENT_AMBULATORY_CARE_PROVIDER_SITE_OTHER)
Admission: EM | Admit: 2011-12-04 | Discharge: 2011-12-04 | Disposition: A | Discharge status: Other Acute Inpatient Hospital | Payer: Medicare Other | Source: Home / Self Care

## 2011-12-04 ENCOUNTER — Encounter (HOSPITAL_COMMUNITY): Payer: Self-pay | Admitting: Cardiology

## 2011-12-04 ENCOUNTER — Encounter (HOSPITAL_COMMUNITY): Payer: Self-pay | Admitting: Emergency Medicine

## 2011-12-04 ENCOUNTER — Emergency Department (HOSPITAL_COMMUNITY)
Admission: EM | Admit: 2011-12-04 | Discharge: 2011-12-04 | Disposition: A | Discharge status: Home / Self Care | Payer: Medicare Other | Attending: Emergency Medicine | Admitting: Emergency Medicine

## 2011-12-04 DIAGNOSIS — I10 Essential (primary) hypertension: Secondary | ICD-10-CM | POA: Insufficient documentation

## 2011-12-04 DIAGNOSIS — M549 Dorsalgia, unspecified: Secondary | ICD-10-CM | POA: Insufficient documentation

## 2011-12-04 DIAGNOSIS — R1012 Left upper quadrant pain: Secondary | ICD-10-CM | POA: Insufficient documentation

## 2011-12-04 DIAGNOSIS — R030 Elevated blood-pressure reading, without diagnosis of hypertension: Secondary | ICD-10-CM

## 2011-12-04 DIAGNOSIS — R109 Unspecified abdominal pain: Secondary | ICD-10-CM

## 2011-12-04 DIAGNOSIS — E119 Type 2 diabetes mellitus without complications: Secondary | ICD-10-CM | POA: Insufficient documentation

## 2011-12-04 DIAGNOSIS — Z79899 Other long term (current) drug therapy: Secondary | ICD-10-CM | POA: Insufficient documentation

## 2011-12-04 DIAGNOSIS — Z794 Long term (current) use of insulin: Secondary | ICD-10-CM | POA: Insufficient documentation

## 2011-12-04 HISTORY — DX: Essential (primary) hypertension: I10

## 2011-12-04 HISTORY — DX: Type 2 diabetes mellitus without complications: E11.9

## 2011-12-04 LAB — COMPREHENSIVE METABOLIC PANEL
AST: 32 U/L (ref 0–37)
Albumin: 3.9 g/dL (ref 3.5–5.2)
BUN: 20 mg/dL (ref 6–23)
CO2: 27 mEq/L (ref 19–32)
Calcium: 10 mg/dL (ref 8.4–10.5)
Creatinine, Ser: 1.11 mg/dL — ABNORMAL HIGH (ref 0.50–1.10)
GFR calc non Af Amer: 51 mL/min — ABNORMAL LOW (ref >90)
Total Bilirubin: 0.4 mg/dL (ref 0.3–1.2)

## 2011-12-04 LAB — CBC WITH DIFFERENTIAL/PLATELET
Basophils Absolute: 0 10*3/uL (ref 0.0–0.1)
Basophils Relative: 1 % (ref 0–1)
Eosinophils Relative: 2 % (ref 0–5)
HCT: 38.4 % (ref 36.0–46.0)
Hemoglobin: 12.6 g/dL (ref 12.0–15.0)
MCHC: 32.8 g/dL (ref 30.0–36.0)
MCV: 85.1 fL (ref 78.0–100.0)
Monocytes Absolute: 0.7 10*3/uL (ref 0.1–1.0)
Monocytes Relative: 8 % (ref 3–12)
RDW: 13.2 % (ref 11.5–15.5)

## 2011-12-04 LAB — URINALYSIS, ROUTINE W REFLEX MICROSCOPIC
Glucose, UA: 250 mg/dL — AB
Hgb urine dipstick: NEGATIVE
Ketones, ur: NEGATIVE mg/dL
Leukocytes, UA: NEGATIVE
Protein, ur: NEGATIVE mg/dL
pH: 6 (ref 5.0–8.0)

## 2011-12-04 LAB — LIPASE, BLOOD: Lipase: 50 U/L (ref 11–59)

## 2011-12-04 MED ORDER — IOHEXOL 300 MG/ML  SOLN
20.0000 mL | INTRAMUSCULAR | Status: AC
  Administered 2011-12-04: 20 mL via ORAL

## 2011-12-04 MED ORDER — SODIUM CHLORIDE 0.9 % IV BOLUS (SEPSIS)
250.0000 mL | Freq: Once | INTRAVENOUS | Status: AC
  Administered 2011-12-04: 250 mL via INTRAVENOUS

## 2011-12-04 MED ORDER — HYDROCODONE-ACETAMINOPHEN 5-325 MG PO TABS: 1.0000 | ORAL_TABLET | Freq: Four times a day (QID) | ORAL | Status: DC | PRN

## 2011-12-04 MED ORDER — IOHEXOL 300 MG/ML  SOLN
100.0000 mL | Freq: Once | INTRAMUSCULAR | Status: AC | PRN
  Administered 2011-12-04: 100 mL via INTRAVENOUS

## 2011-12-04 MED ORDER — ONDANSETRON HCL 4 MG/2ML IJ SOLN
4.0000 mg | Freq: Once | INTRAMUSCULAR | Status: AC
  Administered 2011-12-04: 4 mg via INTRAVENOUS
  Filled 2011-12-04: qty 2

## 2011-12-04 NOTE — ED Notes (Signed)
[facility] called and given report.  

## 2011-12-04 NOTE — ED Provider Notes (Signed)
History     CSN: [id number]  Arrival date & time 12/04/11  1401   First MD Initiated Contact with Patient 12/04/11 1408      Chief Complaint  Patient presents with  . Abdominal Pain    (Consider location/radiation/quality/duration/timing/severity/associated sxs/prior treatment) The history is provided by the patient.  Patient reports constant left side pain that radiates to midabdomen and mid back.  Pain is gradually worsening and began 4 days ago associated with lightheadedness, dizziness and headache.  Reports pain is worse with movement and with palpation.  No known alleviating factors.   Known history of hypertension, asthma, diabetes, high cholesterol.  States blood pressure has been elevated and has been working with her pcp to effectively lower the numbers but over the past week noted blood pressure to be increasingly higher than normal.    Past Medical History  Diagnosis Date  . Hypertension   . Diabetes mellitus without complication     Past Surgical History  Procedure Date  . Abdominal hysterectomy   . Breast surgery     No family history on file.  History  Substance Use Topics  . Smoking status: Never Smoker   . Smokeless tobacco: Not on file  . Alcohol Use: No    OB History    Grav Para Term Preterm Abortions TAB SAB Ect Mult Living                  Review of Systems  Constitutional: Negative.   Respiratory: Negative.   Cardiovascular: Positive for chest pain. Negative for palpitations and leg swelling.  Gastrointestinal: Positive for abdominal pain. Negative for nausea, vomiting, diarrhea and constipation.  Genitourinary: Negative.   Neurological: Positive for dizziness, light-headedness and headaches.  All other systems reviewed and are negative.    Allergies  Shellfish allergy  Home Medications   Current Outpatient Rx  Name  Route  Sig  Dispense  Refill  . AMLODIPINE BESYLATE 10 MG PO TABS   Oral   Take 10 mg by mouth daily.           . ASPIRIN 81 MG PO TABS   Oral   Take 81 mg by mouth daily.         Marland Kitchen METOPROLOL SUCCINATE ER 100 MG PO TB24   Oral   Take 100 mg by mouth daily. Take with or immediately following a meal.         . ROSUVASTATIN CALCIUM 40 MG PO TABS   Oral   Take 40 mg by mouth daily.         Marland Kitchen VALSARTAN-HYDROCHLOROTHIAZIDE 320-25 MG PO TABS   Oral   Take 1 tablet by mouth daily.         Marland Kitchen HYDROCODONE-ACETAMINOPHEN 5-325 MG PO TABS   Oral   Take 1 tablet by mouth every 6 (six) hours as needed for pain.   20 tablet   0   . INSULIN ASPART PROT & ASPART (70-30) 100 UNIT/ML [place] SUSP   Subcutaneous   Inject 35 Units into the skin 2 (two) times daily with a meal.         . LEVOTHYROXINE SODIUM 50 MCG PO TABS   Oral   Take 50 mcg by mouth daily.         Marland Kitchen METFORMIN HCL 500 MG PO TABS   Oral   Take 500 mg by mouth 2 (two) times daily with a meal.           BP  222/98  Pulse 66  Temp 97 F (36.1 C) (Oral)  Resp 20  SpO2 99%  Physical Exam  Nursing note and vitals reviewed. Constitutional: She is oriented to person, place, and time. Vital signs are normal. She appears well-developed and well-nourished. She is active and cooperative.  HENT:  Head: Normocephalic.  Eyes: Conjunctivae normal and EOM are normal. Pupils are equal, round, and reactive to light. No scleral icterus.  Neck: Trachea normal and normal range of motion. Neck supple. No JVD present. Carotid bruit is not present.  Cardiovascular: Normal rate, regular rhythm, normal heart sounds, intact distal pulses and normal pulses.   Pulmonary/Chest: Effort normal and breath sounds normal.  Abdominal: Soft. Bowel sounds are normal. There is tenderness. There is rebound. There is no CVA tenderness.  Lymphadenopathy:       Head (right side): No submental, no submandibular, no tonsillar, no preauricular, no posterior auricular and no occipital adenopathy present.       Head (left side): No submental, no submandibular, no  tonsillar, no preauricular, no posterior auricular and no occipital adenopathy present.    She has no cervical adenopathy.  Neurological: She is alert and oriented to person, place, and time. She has normal strength. No cranial nerve deficit or sensory deficit. Coordination and gait normal. GCS eye subscore is 4. GCS verbal subscore is 5. GCS motor subscore is 6.       Maex4, strength equal bilateral, no past pointing.  Skin: Skin is warm and dry.  Psychiatric: She has a normal mood and affect. Her speech is normal and behavior is normal. Judgment and thought content normal. Cognition and memory are normal.    ED Course  Procedures (including critical care time)  EKG-  Sinus bradycardia, Nonspecific T wave abnormality, Abnormal ECG   1. Abdominal  pain, other specified site   2. Elevated blood pressure       MDM  Transfer to [facility] for further evaluation and treatment of abdominal pain and elevated blood pressure.        Johnsie Kindred, NP 12/06/11 1107

## 2011-12-04 NOTE — ED Notes (Addendum)
Pt reports luq abd pain that started yesterday. Went to [facility] and was transferred here for further work-up. Pt also here with HTN Bp-222/98 HR-54. Reports pain with movement and inspiration. [facility] and [facility] reports multiple IV sticks without success.

## 2011-12-04 NOTE — ED Notes (Signed)
IV team called for start 

## 2011-12-04 NOTE — ED Notes (Signed)
Pt c/o abd pain x4 days... Pain starts at left side of abd and radiates towards left side of back.... Sx include: light headed, headaches, dizziness... Denies: fevers, nausea, vomiting, diarrhea, SOB, blurry vision... Pt is alert w/no signs of distress.

## 2011-12-04 NOTE — ED Provider Notes (Signed)
History     CSN: [id number]  Arrival date & time 12/04/11  1624   First MD Initiated Contact with Patient 12/04/11 1716      Chief Complaint  Patient presents with  . Abdominal Pain     Patient is a 66 y.o. female presenting with abdominal pain. The history is provided by the patient.  Abdominal Pain The primary symptoms of the illness include abdominal pain. The primary symptoms of the illness do not include fever, shortness of breath, vomiting, diarrhea, hematochezia or dysuria. The current episode started more than 2 days ago. The onset of the illness was gradual. The problem has been gradually worsening.  Additional symptoms associated with the illness include back pain. Symptoms associated with the illness do not include diaphoresis or urgency.  Pt reports gradual onset of LUQ pain for past 3 days that will intermittently radiate to left flank No falls.  No focal weakness.  No CP/SOB/Cough.  No syncope.  No new leg weakness.  No dysuria Never had this before Worse with palpation, movement in bed and with deep breathing  Past Medical History  Diagnosis Date  . Hypertension   . Diabetes mellitus without complication     Past Surgical History  Procedure Date  . Abdominal hysterectomy   . Breast surgery     History reviewed. No pertinent family history.  History  Substance Use Topics  . Smoking status: Never Smoker   . Smokeless tobacco: Not on file  . Alcohol Use: No    OB History    Grav Para Term Preterm Abortions TAB SAB Ect Mult Living                  Review of Systems  Constitutional: Negative for fever and diaphoresis.  Respiratory: Negative for shortness of breath.   Gastrointestinal: Positive for abdominal pain. Negative for vomiting, diarrhea and hematochezia.  Genitourinary: Negative for dysuria and urgency.  Musculoskeletal: Positive for back pain.  All other systems reviewed and are negative.    Allergies  Shellfish allergy  Home  Medications   Current Outpatient Rx  Name Route Sig Dispense Refill  . AMLODIPINE BESYLATE 10 MG PO TABS Oral Take 10 mg by mouth daily.    . ASPIRIN 81 MG PO TABS Oral Take 81 mg by mouth daily.    . INSULIN ASPART PROT & ASPART (70-30) 100 UNIT/ML [place] SUSP Subcutaneous Inject 35 Units into the skin 2 (two) times daily with a meal.    . LEVOTHYROXINE SODIUM 50 MCG PO TABS Oral Take 50 mcg by mouth daily.    Marland Kitchen METFORMIN HCL 500 MG PO TABS Oral Take 500 mg by mouth 2 (two) times daily with a meal.    . METOPROLOL SUCCINATE ER 100 MG PO TB24 Oral Take 100 mg by mouth daily. Take with or immediately following a meal.    . ROSUVASTATIN CALCIUM 40 MG PO TABS Oral Take 40 mg by mouth daily.    Marland Kitchen VALSARTAN-HYDROCHLOROTHIAZIDE 320-25 MG PO TABS Oral Take 1 tablet by mouth daily.      BP 185/73  Pulse 51  Temp 98.6 F (37 C) (Oral)  Resp 18  SpO2 100%  Physical Exam CONSTITUTIONAL: Well developed/well nourished HEAD AND FACE: Normocephalic/atraumatic EYES: EOMI/PERRL ENMT: Mucous membranes moist NECK: supple no meningeal signs SPINE:entire spine nontender CV: S1/S2 noted, no murmurs/rubs/gallops noted LUNGS: Lungs are clear to auscultation bilaterally, no apparent distress ABDOMEN: soft, tender in LUQ, tenderness is moderate, no rebound or guarding BJ:YNWG  cva tenderness NEURO: Pt is awake/alert, moves all extremitiesx4 EXTREMITIES: pulses normal, full ROM SKIN: warm, color normal PSYCH: no abnormalities of mood noted  ED Course  Procedures    Labs Reviewed  GLUCOSE, CAPILLARY  CBC WITH DIFFERENTIAL  COMPREHENSIVE METABOLIC PANEL  LIPASE, BLOOD  LACTIC ACID, PLASMA  URINALYSIS, ROUTINE W REFLEX MICROSCOPIC   5:48 PM Pt stable/nontoxic.  She is clearly tender in LUQ/left flank.  Doubt PE/ACS at this time 6:39 PM Continued pain in LUQ Will get CT imaging   MDM  Nursing notes including past medical history and social history reviewed and considered in  documentation Labs/vital reviewed and considered Previous records reviewed and considered - records from [facility] reviewed.  Previous CT imaging shows h/o small left kidney stone        Date: 12/04/2011 1525  Rate: 53  Rhythm: sinus bradycardia  QRS Axis: normal  Intervals: normal  ST/T Wave abnormalities: nonspecific ST changes  Conduction Disutrbances:none  Narrative Interpretation:   Old EKG Reviewed: unchanged from prior in 2008   Date: 12/04/2011 1713  Rate: 55  Rhythm: sinus bradycardia  QRS Axis: normal  Intervals: normal  ST/T Wave abnormalities: nonspecific ST changes  Conduction Disutrbances:none  Narrative Interpretation:   Old EKG Reviewed: unchanged    Joya Gaskins, MD 12/05/11 [phone]

## 2011-12-04 NOTE — ED Notes (Addendum)
IV attempted x2 (20 G LEFT FA & 20 G RIGHT AC) w/o success by this Clinical research associate. IV attempted x1 (22 G LEFT wrist) by T. Bowles w/o success. Pt reports she usually has to be stuck multiple times - IV team used in the past.

## 2011-12-04 NOTE — ED Provider Notes (Signed)
CT and lab results reviewed, discussed with Dr. Bebe Shaggy, shared with patient.  No identified cause of current symptoms.  Patient states pain has improved, but still has tenderness upon palpation to left upper quadrant.  Patient is scheduled to see her PCP next week.  Jimmye Norman, NP 12/04/11 2330

## 2011-12-05 NOTE — ED Provider Notes (Signed)
Medical screening examination/treatment/procedure(s) were conducted as a shared visit with non-physician practitioner(s) and myself.  I personally evaluated the patient during the encounter   Joya Gaskins, MD 12/05/11 [phone]

## 2011-12-07 NOTE — ED Provider Notes (Signed)
Medical screening examination/treatment/procedure(s) were performed by resident physician or non-physician practitioner and as supervising physician I was immediately available for consultation/collaboration.   Barkley Bruns MD.    Linna Hoff, MD 12/07/11 [phone]

## 2012-08-24 ENCOUNTER — Telehealth: Payer: Self-pay | Admitting: Endocrinology

## 2012-08-24 MED ORDER — INSULIN GLARGINE 100 UNIT/ML SOLOSTAR PEN: 30.0000 [IU] | PEN_INJECTOR | Freq: Every day | SUBCUTANEOUS | Status: DC

## 2012-08-24 NOTE — Telephone Encounter (Signed)
She is taking 30 units a day, also should be scheduled for followup with labs in about 2-3 weeks

## 2012-08-24 NOTE — Telephone Encounter (Signed)
Pt says she is on 25 units of Lantus 1 time a day, I don't see it in her chart?

## 2012-12-01 ENCOUNTER — Other Ambulatory Visit: Payer: Self-pay | Admitting: Interventional Cardiology

## 2012-12-21 ENCOUNTER — Encounter: Payer: Self-pay | Admitting: *Deleted

## 2012-12-21 ENCOUNTER — Encounter: Payer: Self-pay | Admitting: Interventional Cardiology

## 2012-12-21 DIAGNOSIS — I10 Essential (primary) hypertension: Secondary | ICD-10-CM | POA: Insufficient documentation

## 2012-12-22 ENCOUNTER — Encounter: Payer: Self-pay | Admitting: Interventional Cardiology

## 2012-12-22 ENCOUNTER — Ambulatory Visit (INDEPENDENT_AMBULATORY_CARE_PROVIDER_SITE_OTHER): Payer: 59 | Admitting: Interventional Cardiology

## 2012-12-22 VITALS — BP 140/79 | HR 52 | Ht 63.0 in | Wt 237.0 lb

## 2012-12-22 DIAGNOSIS — E669 Obesity, unspecified: Secondary | ICD-10-CM

## 2012-12-22 DIAGNOSIS — I2510 Atherosclerotic heart disease of native coronary artery without angina pectoris: Secondary | ICD-10-CM | POA: Insufficient documentation

## 2012-12-22 DIAGNOSIS — I10 Essential (primary) hypertension: Secondary | ICD-10-CM

## 2012-12-22 MED ORDER — ROSUVASTATIN CALCIUM 40 MG PO TABS: 40.0000 mg | ORAL_TABLET | Freq: Every day | ORAL | Status: AC

## 2012-12-22 NOTE — Patient Instructions (Signed)
Your physician wants you to follow-up in: 6 months with Dr. Eldridge Dace. You will receive a reminder letter in the mail two months in advance. If you don't receive a letter, please call our office to schedule the follow-up appointment.  Your physician has requested that you regularly monitor and record your blood pressure readings at home. If systolic BP is greater than 160 you can take an extra 0.2 mg tablet of Clonidine that day.  Your physician recommends that you continue on your current medications as directed. Please refer to the Current Medication list given to you today.

## 2012-12-22 NOTE — Progress Notes (Signed)
Patient ID: Heidi Campbell, female   DOB: [birth year], 67 y.o.   MRN: [id number]    [address] [place], [place]  [address] Phone: [phone] Fax:  [phone]  Date:  12/22/2012   ID:  Heidi Campbell, DOB 05-Sep-1945, MRN [id number]  PCP:  Astrid Divine, MD      History of Present Illness: Heidi Campbell is a 67 y.o. female who has had medically managed CAD. Her husband passed away from prostate CA last month. She had a physical a week ago. She has been having trouble sleeping. SHe has not been checking her BP at home. CAD/ASCVD:  Denies : Chest pain.  Diet.  Dyspnea on exertion.  Fatigue.  Leg edema.  She walks 20 minutes every other day. She feels better doing that.    Wt Readings from Last 3 Encounters:  12/22/12 237 lb (107.502 kg)  12/05/08 256 lb (116.121 kg)  05/31/08 256 lb (116.121 kg)     Past Medical History  Diagnosis Date  . Hypertension   . Diabetes mellitus without complication   . ALLERGIC RHINITIS   . ASTHMA   . Esophageal reflux   . SINUSITIS, ACUTE     Current Outpatient Prescriptions  Medication Sig Dispense Refill  . amLODipine (NORVASC) 10 MG tablet Take 10 mg by mouth daily.      Marland Kitchen aspirin 81 MG tablet Take 81 mg by mouth daily.      Marland Kitchen HYDROcodone-acetaminophen (NORCO/VICODIN) 5-325 MG per tablet Take 1 tablet by mouth every 6 (six) hours as needed for pain.  20 tablet  0  . Insulin Glargine (LANTUS SOLOSTAR) 100 UNIT/ML SOPN Inject 30 Units into the skin daily.  5 pen  3  . irbesartan-hydrochlorothiazide (AVALIDE) 150-12.5 MG per tablet take 2 tablets by mouth once daily  60 tablet  1  . levothyroxine (SYNTHROID, LEVOTHROID) 50 MCG tablet Take 50 mcg by mouth daily.      . metFORMIN (GLUCOPHAGE) 500 MG tablet Take 500 mg by mouth 2 (two) times daily with a meal.      . metoprolol succinate (TOPROL-XL) 100 MG 24 hr tablet Take 100 mg by mouth daily. Take with or immediately following a meal.      . rosuvastatin (CRESTOR)  40 MG tablet Take 40 mg by mouth daily.      . valsartan-hydrochlorothiazide (DIOVAN-HCT) 320-25 MG per tablet Take 1 tablet by mouth daily.       No current facility-administered medications for this visit.    Allergies:    Allergies  Allergen Reactions  . Shellfish Allergy     Pt cannot recall at this time    Social History:  The patient  reports that she has never smoked. She does not have any smokeless tobacco history on file. She reports that she does not drink alcohol or use illicit drugs.   Family History:  The patient's family history is not on file.   ROS:  Please see the history of present illness.  No nausea, vomiting.  No fevers, chills.  No focal weakness.  No dysuria.    All other systems reviewed and negative.   PHYSICAL EXAM: VS:  BP 140/79  Pulse 52  Ht 5\' 3"  (1.6 m)  Wt 237 lb (107.502 kg)  BMI 41.99 kg/m2 Well nourished, well developed, in no acute distress HEENT: normal Neck: no JVD, no carotid bruits Cardiac:  normal S1, S2; RRR;  Lungs:  clear to auscultation bilaterally, no wheezing,  rhonchi or rales Abd: soft, nontender, no hepatomegaly Ext: no edema Skin: warm and dry Neuro:   no focal abnormalities noted      ASSESSMENT AND PLAN:  Essential hypertension, benign  Continue Amlodipine Besylate Tablet, 10 MG, 1 tablet, Orally, Once a day, 90 days, 90, Refills 3 Continue Irbesartan-Hydrochlorothiazide Tablet, 300-12.5 MG, 1 tablet, Orally, Once a day, 90 day(s), 90, Refills 3 Refill Toprol XL Tablet Extended Release 24 Hour, 100 MG, 1 tablet, Orally, Once a day, 90 days, 90, Refills 3 Notes: Not well controlled today. Has been better at other appts. Check BP at home. Could increase frequency of clonidine. She has headaches with the high blood pressure. If her blood pressure spikes above 160 systolic, she should take an extra clonidine tablet. Typically, clonidine is used more than once a day, but she has so many other medications, she states that she was  told to take it only once a day.     2. CAD in native artery  IMAGING: EKG    Harward,Amy 08/21/2012 03:24:53 PM > Treasa Bradshaw,JAY 08/21/2012 03:33:21 PM > sinus bradycardia, nonspecific ST, T-wave changes   Notes: Moderate by prior cath. No angina.    3. Morbid obesity with BMI of 40.0-44.9, adult  Notes: Spoke about the importance of weight loss. She will try to increase activity. She can improve her diet as well. Weight loss can help BP.    Preventive Medicine  Adult topics discussed:  Diet: healthy diet, Lots of fruits and vegetables.  Exercise: 5 days a week, at least 30 minutes of aerobic exercise.      Signed, Fredric Mare, MD, [facility] 12/22/2012 9:59 AM

## 2013-01-29 ENCOUNTER — Encounter: Payer: Self-pay | Admitting: Endocrinology

## 2013-02-01 HISTORY — PX: OTHER SURGICAL HISTORY: SHX169

## 2013-02-19 ENCOUNTER — Other Ambulatory Visit: Payer: Self-pay | Admitting: *Deleted

## 2013-02-19 ENCOUNTER — Encounter: Payer: Self-pay | Admitting: Endocrinology

## 2013-02-19 ENCOUNTER — Ambulatory Visit (INDEPENDENT_AMBULATORY_CARE_PROVIDER_SITE_OTHER): Payer: 59 | Admitting: Endocrinology

## 2013-02-19 VITALS — BP 148/82 | HR 62 | Temp 98.1°F | Resp 14 | Ht 62.5 in | Wt 234.5 lb

## 2013-02-19 DIAGNOSIS — E785 Hyperlipidemia, unspecified: Secondary | ICD-10-CM

## 2013-02-19 DIAGNOSIS — I10 Essential (primary) hypertension: Secondary | ICD-10-CM

## 2013-02-19 DIAGNOSIS — IMO0001 Reserved for inherently not codable concepts without codable children: Secondary | ICD-10-CM

## 2013-02-19 DIAGNOSIS — E1165 Type 2 diabetes mellitus with hyperglycemia: Principal | ICD-10-CM

## 2013-02-19 MED ORDER — METFORMIN HCL ER 500 MG PO TB24: ORAL_TABLET | ORAL | Status: DC

## 2013-02-19 MED ORDER — GLUCOSE BLOOD VI STRP: ORAL_STRIP | Status: DC

## 2013-02-19 NOTE — Progress Notes (Deleted)
Reason for Appointment: Diabetes follow-up   History of Present Illness   Diagnosis: Type 2 DIABETES MELITUS, date of diagnosis:     Previous history:  Recent history:     Oral hypoglycemic drugs:        Side effects from medications: None Insulin regimen:         Proper timing of medications in relation to meals: Yes.          Monitors blood glucose: Once a day.    Glucometer: One [employer].          Blood Glucose readings from recall: readings before breakfast: 145-160  Ac 150-200;  Hypoglycemia frequency:  none         Meals: 2 meals per day. Bfst 8-9, supper 6 pm Diet: low appetite         Physical activity: exercise: bike, walking 4/7           Dietician visit: Most recent:***    Weight control:  Wt Readings from Last 3 Encounters:  02/19/13 234 lb 8 oz (106.369 kg)  12/22/12 237 lb (107.502 kg)  12/05/08 256 lb (116.121 kg)          Complications:     Diabetes labs:  Lab Results  Component Value Date   HGBA1C 9.5* 01/17/2013   Lab Results  Component Value Date   CREATININE 1.11* 12/04/2011       Medication List       This list is accurate as of: 02/19/13  2:11 PM.  Always use your most recent med list.               amLODipine 10 MG tablet  Commonly known as:  NORVASC  Take 10 mg by mouth daily.     aspirin 81 MG tablet  Take 81 mg by mouth daily.     cloNIDine 0.2 MG tablet  Commonly known as:  CATAPRES  Take 0.2 mg by mouth daily. If your systolic BP is greater than 160 you can take an extra 0.2 mg tablet that day.     HYDROcodone-acetaminophen 5-325 MG per tablet  Commonly known as:  NORCO/VICODIN  Take 1 tablet by mouth every 6 (six) hours as needed for pain.     Insulin Glargine 100 UNIT/ML Solostar Pen  Commonly known as:  LANTUS SOLOSTAR  Inject 30 Units into the skin daily.     irbesartan-hydrochlorothiazide 150-12.5 MG per tablet  Commonly known as:  AVALIDE  take 2 tablets by mouth once daily     levothyroxine 50 MCG tablet   Commonly known as:  SYNTHROID, LEVOTHROID  Take 50 mcg by mouth daily.     metFORMIN 500 MG tablet  Commonly known as:  GLUCOPHAGE  Take 500 mg by mouth 2 (two) times daily with a meal.     metoprolol succinate 100 MG 24 hr tablet  Commonly known as:  TOPROL-XL  Take 100 mg by mouth daily. Take with or immediately following a meal.     rosuvastatin 40 MG tablet  Commonly known as:  CRESTOR  Take 1 tablet (40 mg total) by mouth daily.        Allergies:  Allergies  Allergen Reactions  . Shellfish Allergy     Pt cannot recall at this time    Past Medical History  Diagnosis Date  . Hypertension   . Diabetes mellitus without complication   . ALLERGIC RHINITIS   . ASTHMA   . Esophageal reflux   .  SINUSITIS, ACUTE     Past Surgical History  Procedure Laterality Date  . Abdominal hysterectomy    . Breast surgery      Family History  Problem Relation Age of Onset  . Diabetes Mother   . Heart attack Father   . Heart disease Father     Social History:  reports that she has never smoked. She does not have any smokeless tobacco history on file. She reports that she does not drink alcohol or use illicit drugs.  Review of Systems:  Hypertension:    Lipids:     LABS:  No visits with results within 1 Week(s) from this visit. Latest known visit with results is:  Abstract on 01/29/2013  Component Date Value Range Status  . Hemoglobin A1C 01/17/2013 9.5* 4.0 - 6.0 % Final     Examination:   BP 148/82  Pulse 62  Temp(Src) 98.1 F (36.7 C)  Resp 14  Ht 5' 2.5" (1.588 m)  Wt 234 lb 8 oz (106.369 kg)  BMI 42.18 kg/m2  SpO2 93%  Body mass index is 42.18 kg/(m^2).    ASSESSMENT/ PLAN::    Diabetes type 2:  Blood glucose control is poor with her noncompliance with mealtime insulin as instructed on her last visit She did not understand the need for mealtime coverage and has not taken the Humalog because of cost   [facility] 02/19/2013, 2:11 PM

## 2013-02-19 NOTE — Patient Instructions (Addendum)
Take 34 Lantus in ams   Humulin R, 8 units before Bfst and 10 before supper (15-30 min before meal)  Please check blood sugars at least half the time about 2 hours after any meal and as directed on waking up. Please bring blood sugar monitor to each visit  Change Metformin to ER, 1 in am and 2 in pm

## 2013-02-19 NOTE — Progress Notes (Signed)
Patient ID: Heidi Campbell, female   DOB: [birth year], 68 y.o.   MRN: [id number]   Reason for Appointment: Diabetes follow-up   History of Present Illness   Diagnosis: Type 2 DIABETES MELITUS, date of diagnosis: 1998      Previous history: She was initially treated with oral hypoglycemic drugs but switched to insulin when she was hospitalized for asthma in 2007. She has been on metformin but only 500 mg twice a day Over most of her diabetes history has been taking premixed insulin, NovoLog mix 70/30, twice a day Although her A1c in the past has been close to 7% she would tend to have hyperglycemia at 11 AM or overnight with his insulin Her most recent A1c in 2014 was 6.8 in 4/14  Recent history: On her last visit in 6/14 she was told to switch from premixed insulin to Lantus and Humalog for better basal bolus action. She was previously taking 35 units twice a day of premixed insulin Also she was supposed to switch from 500 milligram to 1000 mg metformin twice a day However because of the cost of Humalog she did not take it and is taking only Lantus and has not followed up over the last 7 months She was referred back by her PCP when her A1c was significantly high at 9.5% in 12/14 She is checking her blood sugars only before breakfast and suppertime and did not bring her monitor today She has lost some weight since her last visit when it was 242 pounds     Oral hypoglycemic drugs: Metformin 500 mg twice a day        Side effects from medications:  occasional diarrhea from metformin Insulin regimen:    30 Lantus at bedtime       Proper timing of medications in relation to meals: Yes.          Monitors blood glucose: Once a day.    Glucometer: [employer] psychotherapist.          Blood Glucose readings from recall: readings before breakfast: 140-150. Suppertime 150-200  Hypoglycemia frequency:  none         Meals:  usually 2 meals per day, not eating lunch.  she thinks her portions are small          Physical activity: exercise: Only occasionally riding exercise bike           Dietician visit: Most recent: 1998 ?     Weight control:  Wt Readings from Last 3 Encounters:  02/19/13 234 lb 8 oz (106.369 kg)  12/22/12 237 lb (107.502 kg)  12/05/08 256 lb (116.121 kg)        Diabetes labs:  Lab Results  Component Value Date   HGBA1C 9.5* 01/17/2013   Lab Results  Component Value Date   CREATININE 1.11* 12/04/2011     Complications: None   Last eye exam: 02/2012     Medication List       This list is accurate as of: 02/19/13  5:07 PM.  Always use your most recent med list.               amLODipine 10 MG tablet  Commonly known as:  NORVASC  Take 10 mg by mouth daily.     aspirin 81 MG tablet  Take 81 mg by mouth daily.     cloNIDine 0.2 MG tablet  Commonly known as:  CATAPRES  Take 0.2 mg by mouth daily. If your systolic BP is  greater than 160 you can take an extra 0.2 mg tablet that day.     glucose blood test strip  Commonly known as:  ONETOUCH VERIO  Use as instructed to check blood sugars 2 times per day, dx code 250.00     HYDROcodone-acetaminophen 5-325 MG per tablet  Commonly known as:  NORCO/VICODIN  Take 1 tablet by mouth every 6 (six) hours as needed for pain.     Insulin Glargine 100 UNIT/ML Solostar Pen  Commonly known as:  LANTUS SOLOSTAR  Inject 30 Units into the skin daily.     irbesartan-hydrochlorothiazide 150-12.5 MG per tablet  Commonly known as:  AVALIDE  take 2 tablets by mouth once daily     levothyroxine 50 MCG tablet  Commonly known as:  SYNTHROID, LEVOTHROID  Take 50 mcg by mouth daily.     metFORMIN 500 MG 24 hr tablet  Commonly known as:  GLUCOPHAGE XR  Take 3 tablets daily     metoprolol succinate 100 MG 24 hr tablet  Commonly known as:  TOPROL-XL  Take 100 mg by mouth daily. Take with or immediately following a meal.     rosuvastatin 40 MG tablet  Commonly known as:  CRESTOR  Take 1 tablet (40 mg total) by mouth daily.         Allergies:  Allergies  Allergen Reactions  . Shellfish Allergy     Pt cannot recall at this time    Past Medical History  Diagnosis Date  . Hypertension   . Diabetes mellitus without complication   . ALLERGIC RHINITIS   . ASTHMA   . Esophageal reflux   . SINUSITIS, ACUTE     Past Surgical History  Procedure Laterality Date  . Abdominal hysterectomy    . Breast surgery      Family History  Problem Relation Age of Onset  . Diabetes Mother   . Heart attack Father   . Heart disease Father     Social History:  reports that she has never smoked. She does not have any smokeless tobacco history on file. She reports that she does not drink alcohol or use illicit drugs.  Review of Systems:  Hypertension:  has fair control with amlodipine, clonidine and Avalide, followed by PCP  Renal function: Creatinine 0.99 done in 12/14  Lipids: Have not been controlled well previously, had been irregular on Crestor at that time. Last LDL was 168  Neuropathy: She previously had a burning pain in her feet treated with gabapentin, no symptoms recently  She is complaining of persistent headaches for the last 2 months and has side effects from current medications used for treatment    LABS:  No visits with results within 1 Week(s) from this visit. Latest known visit with results is:  Abstract on 01/29/2013  Component Date Value Range Status  . Hemoglobin A1C 01/17/2013 9.5* 4.0 - 6.0 % Final     Examination:   BP 148/82  Pulse 62  Temp(Src) 98.1 F (36.7 C)  Resp 14  Ht 5' 2.5" (1.588 m)  Wt 234 lb 8 oz (106.369 kg)  BMI 42.18 kg/m2  SpO2 93%  Body mass index is 42.18 kg/(m^2).    ASSESSMENT/ PLAN::    Diabetes type 2:  Blood glucose control is much worse with using Lantus only She appears to have poor understanding of insulin to control blood sugars and needs more education Explained to her that she will need mealtime coverage Previously had a tendency to  hypoglycemia  with premixed insulin and inadequate control Also may be able to lose weight easier with basal bolus rather than premixed or NPH insulin  Recommendation: Take 34 Lantus in ams instead of p.m. and the dose was increased by 4 units She will use the co-pay card to help offset the cost of Lantus Start Humulin R, 8 units before Bfst and 10 before supper (15-30 min before meal). This will be less expensive than analog insulin Need to check blood sugars at least half the time about 2 hours after any meal and as directed on waking up. She will have to bring blood sugar monitor to each visit Change Metformin to ER, 1 in am and 2 in pm for more effective dosage More consistent exercise program  Lipids: Not clear if these are being monitored with PCP, no recent records available. Will repeat on next visit  Hypertension: Blood pressure is high normal today, she will continue to followup with PCP  [facility] 02/19/2013, 5:07 PM

## 2013-03-08 ENCOUNTER — Other Ambulatory Visit (INDEPENDENT_AMBULATORY_CARE_PROVIDER_SITE_OTHER): Payer: 59

## 2013-03-08 DIAGNOSIS — E785 Hyperlipidemia, unspecified: Secondary | ICD-10-CM

## 2013-03-08 DIAGNOSIS — IMO0001 Reserved for inherently not codable concepts without codable children: Secondary | ICD-10-CM

## 2013-03-08 DIAGNOSIS — E1165 Type 2 diabetes mellitus with hyperglycemia: Principal | ICD-10-CM

## 2013-03-08 LAB — COMPREHENSIVE METABOLIC PANEL
ALT: 26 U/L (ref 0–35)
AST: 31 U/L (ref 0–37)
Albumin: 3.7 g/dL (ref 3.5–5.2)
Alkaline Phosphatase: 71 U/L (ref 39–117)
BILIRUBIN TOTAL: 0.6 mg/dL (ref 0.3–1.2)
BUN: 19 mg/dL (ref 6–23)
CO2: 25 meq/L (ref 19–32)
Calcium: 9.7 mg/dL (ref 8.4–10.5)
Chloride: 106 mEq/L (ref 96–112)
Creatinine, Ser: 0.9 mg/dL (ref 0.4–1.2)
GFR: 77.12 mL/min (ref >60.00)
GLUCOSE: 111 mg/dL — AB (ref 70–99)
Potassium: 4 mEq/L (ref 3.5–5.1)
SODIUM: 139 meq/L (ref 135–145)
TOTAL PROTEIN: 7.3 g/dL (ref 6.0–8.3)

## 2013-03-08 LAB — LIPID PANEL
CHOLESTEROL: 239 mg/dL — AB (ref 0–200)
HDL: 49.4 mg/dL (ref >39.00)
Total CHOL/HDL Ratio: 5
Triglycerides: 186 mg/dL — ABNORMAL HIGH (ref 0.0–149.0)
VLDL: 37.2 mg/dL (ref 0.0–40.0)

## 2013-03-08 LAB — MICROALBUMIN / CREATININE URINE RATIO
Creatinine,U: 159.2 mg/dL
Microalb Creat Ratio: 1.4 mg/g (ref 0.0–30.0)
Microalb, Ur: 2.3 mg/dL — ABNORMAL HIGH (ref 0.0–1.9)

## 2013-03-08 LAB — LDL CHOLESTEROL, DIRECT: Direct LDL: 164.3 mg/dL

## 2013-03-09 LAB — FRUCTOSAMINE: FRUCTOSAMINE: 328 umol/L — AB (ref <285)

## 2013-03-14 ENCOUNTER — Ambulatory Visit (INDEPENDENT_AMBULATORY_CARE_PROVIDER_SITE_OTHER): Payer: 59 | Admitting: Endocrinology

## 2013-03-14 ENCOUNTER — Encounter: Payer: Self-pay | Admitting: Endocrinology

## 2013-03-14 VITALS — BP 128/60 | HR 50 | Temp 98.3°F | Resp 14 | Ht 62.5 in | Wt 239.8 lb

## 2013-03-14 DIAGNOSIS — E1165 Type 2 diabetes mellitus with hyperglycemia: Principal | ICD-10-CM

## 2013-03-14 DIAGNOSIS — IMO0001 Reserved for inherently not codable concepts without codable children: Secondary | ICD-10-CM

## 2013-03-14 DIAGNOSIS — E782 Mixed hyperlipidemia: Secondary | ICD-10-CM

## 2013-03-14 DIAGNOSIS — E039 Hypothyroidism, unspecified: Secondary | ICD-10-CM

## 2013-03-14 NOTE — Patient Instructions (Signed)
Please check blood sugars at least half the time about 2 hours after any meal and as directed on waking up. Please bring blood sugar monitor to each visit  Welchol 1 packet every 2 days for 1 week then daily

## 2013-03-14 NOTE — Progress Notes (Signed)
Patient ID: Heidi Campbell, female   DOB: [birth year], 68 y.o.   MRN: [id number]   Reason for Appointment: Diabetes follow-up   History of Present Illness   Diagnosis: Type 2 DIABETES MELITUS, date of diagnosis: 1998      Previous history: She was initially treated with oral hypoglycemic drugs but switched to insulin when she was hospitalized for asthma in 2007. She has been on metformin but only 500 mg twice a day Over most of her diabetes history has been taking premixed insulin, NovoLog mix 70/30, twice a day Although her A1c in the past has been close to 7% she would tend to have hyperglycemia at 11 AM or overnight with his insulin Her most recent A1c in 2014 was 6.8 in 4/14  Recent history: On her last visit in 1/15 she was given regular insulin in addition to her Lantus because of overall high readings especially non-fasting She is eating 2 meals a day and is taking the regular insulin about 30 minutes before breakfast and supper as directed A1c had been much higher in 12/14  She was previously taking 35 units twice a day of premixed insulin Also she is taking metformin, now 1500 mg without side effects She is checking her blood sugars mostly before breakfast and suppertime despite reminders to do better She has lost some weight since her last visit also     Oral hypoglycemic drugs: Metformin ER1500 mg         Side effects from medications:  occasional diarrhea from metformin Insulin regimen:  34 Lantus at breakfast, regular insulin 10 bid      Proper timing of medications in relation to meals: Yes.          Monitors blood glucose: Once a day.    Glucometer: [employer].          Blood Glucose readings from recall: readings before breakfast: 79-202. Afternoon/evening 105-165, overall median 140  Hypoglycemia frequency:  none         Meals:  usually 2 meals per day, not eating lunch.  she thinks her portions are small         Physical activity: exercise: walking, or riding  exercise bike           Dietician visit: Most recent: 1998 ?     Weight control:  Wt Readings from Last 3 Encounters:  03/14/13 239 lb 12.8 oz (108.773 kg)  02/19/13 234 lb 8 oz (106.369 kg)  12/22/12 237 lb (107.502 kg)        Diabetes labs:  Lab Results  Component Value Date   HGBA1C 9.5* 01/17/2013   Lab Results  Component Value Date   MICROALBUR 2.3* 03/08/2013   CREATININE 0.9 03/08/2013     Complications: None   Last eye exam: 02/2012     Medication List       This list is accurate as of: 03/14/13 10:39 AM.  Always use your most recent med list.               amLODipine 10 MG tablet  Commonly known as:  NORVASC  Take 10 mg by mouth daily.     aspirin 81 MG tablet  Take 81 mg by mouth daily.     cloNIDine 0.2 MG tablet  Commonly known as:  CATAPRES  Take 0.2 mg by mouth daily. If your systolic BP is greater than 160 you can take an extra 0.2 mg tablet that day.  glucose blood test strip  Commonly known as:  ONETOUCH VERIO  Use as instructed to check blood sugars 2 times per day, dx code 250.00     HYDROcodone-acetaminophen 5-325 MG per tablet  Commonly known as:  NORCO/VICODIN  Take 1 tablet by mouth every 6 (six) hours as needed for pain.     Insulin Glargine 100 UNIT/ML Solostar Pen  Commonly known as:  LANTUS  Inject 20 Units into the skin daily.     insulin lispro 100 UNIT/ML injection  Commonly known as:  HUMALOG  Inject 10 Units into the skin 2 (two) times daily.     irbesartan-hydrochlorothiazide 150-12.5 MG per tablet  Commonly known as:  AVALIDE  take 2 tablets by mouth once daily     levothyroxine 50 MCG tablet  Commonly known as:  SYNTHROID, LEVOTHROID  Take 50 mcg by mouth daily.     metFORMIN 500 MG 24 hr tablet  Commonly known as:  GLUCOPHAGE XR  Take 3 tablets daily     metoprolol succinate 100 MG 24 hr tablet  Commonly known as:  TOPROL-XL  Take 100 mg by mouth daily. Take with or immediately following a meal.      rosuvastatin 40 MG tablet  Commonly known as:  CRESTOR  Take 1 tablet (40 mg total) by mouth daily.        Allergies:  Allergies  Allergen Reactions  . Shellfish Allergy     Pt cannot recall at this time    Past Medical History  Diagnosis Date  . Hypertension   . Diabetes mellitus without complication   . ALLERGIC RHINITIS   . ASTHMA   . Esophageal reflux   . SINUSITIS, ACUTE     Past Surgical History  Procedure Laterality Date  . Abdominal hysterectomy    . Breast surgery      Family History  Problem Relation Age of Onset  . Diabetes Mother   . Heart attack Father   . Heart disease Father     Social History:  reports that she has never smoked. She does not have any smokeless tobacco history on file. She reports that she does not drink alcohol or use illicit drugs.  Review of Systems:  Hypertension:  has good control with amlodipine, clonidine and Avalide, followed by PCP  Renal function: Creatinine normal  Lipids: Have not been controlled well . Does have a history of CAD also  Lab Results  Component Value Date   CHOL 239* 03/08/2013   HDL 49.40 03/08/2013   LDLDIRECT 164.3 03/08/2013   TRIG 186.0* 03/08/2013   CHOLHDL 5 03/08/2013    Neuropathy: She previously had a burning pain in her feet treated with gabapentin, no symptoms recently     LABS:  Appointment on 03/08/2013  Component Date Value Ref Range Status  . Fructosamine 03/08/2013 328* <285 umol/L Final   Comment:                            Variations in levels of serum proteins (albumin and immunoglobulins)                          may affect fructosamine results.                             Alyson Reedy. Microalb, Ur 03/08/2013 2.3* 0.0 - 1.9 mg/dL Final  . Creatinine,U  03/08/2013 159.2   Final  . Microalb Creat Ratio 03/08/2013 1.4  0.0 - 30.0 mg/g Final  . Sodium 03/08/2013 139  135 - 145 mEq/L Final  . Potassium 03/08/2013 4.0  3.5 - 5.1 mEq/L Final  . Chloride 03/08/2013 106  96 - 112 mEq/L Final  .  CO2 03/08/2013 25  19 - 32 mEq/L Final  . Glucose, Bld 03/08/2013 111* 70 - 99 mg/dL Final  . BUN 16/11/9602 19  6 - 23 mg/dL Final  . Creatinine, Ser 03/08/2013 0.9  0.4 - 1.2 mg/dL Final  . Total Bilirubin 03/08/2013 0.6  0.3 - 1.2 mg/dL Final  . Alkaline Phosphatase 03/08/2013 71  39 - 117 U/L Final  . AST 03/08/2013 31  0 - 37 U/L Final  . ALT 03/08/2013 26  0 - 35 U/L Final  . Total Protein 03/08/2013 7.3  6.0 - 8.3 g/dL Final  . Albumin 54/10/8117 3.7  3.5 - 5.2 g/dL Final  . Calcium 14/78/2956 9.7  8.4 - 10.5 mg/dL Final  . GFR 21/30/8657 77.12  >60.00 mL/min Final  . Cholesterol 03/08/2013 239* 0 - 200 mg/dL Final   ATP III Classification       Desirable:  < 200 mg/dL               Borderline High:  200 - 239 mg/dL          High:  > = 846 mg/dL  . Triglycerides 03/08/2013 186.0* 0.0 - 149.0 mg/dL Final   Normal:  <962 mg/dLBorderline High:  150 - 199 mg/dL  . HDL 03/08/2013 49.40  >39.00 mg/dL Final  . VLDL 95/28/4132 37.2  0.0 - 40.0 mg/dL Final  . Total CHOL/HDL Ratio 03/08/2013 5   Final                  Men          Women1/2 Average Risk     3.4          3.3Average Risk          5.0          4.42X Average Risk          9.6          7.13X Average Risk          15.0          11.0                      . Direct LDL 03/08/2013 164.3   Final   Optimal:  <100 mg/dLNear or Above Optimal:  100-129 mg/dLBorderline High:  130-159 mg/dLHigh:  160-189 mg/dLVery High:  >190 mg/dL     Examination:   BP 440/10  Pulse 50  Temp(Src) 98.3 F (36.8 C)  Resp 14  Ht 5' 2.5" (1.588 m)  Wt 239 lb 12.8 oz (108.773 kg)  BMI 43.13 kg/m2  SpO2 98%  Body mass index is 43.13 kg/(m^2).    ASSESSMENT/ PLAN::    Diabetes type 2:  Blood glucose control is somewhat better although she has had some inconsistent readings in the morning Discussed needing to check more readings after meals since it is difficult to adjust her regular insulin without postprandial readings Overall she is doing somewhat  better with fructosamine mildly increased relative to the baseline A1c of 9.5 in December Otherwise she appears to be compliant with diet and exercise most of the time Problem benefiting from increased dose of metformin  also  Recommendations:  No change in insulin as yet, discussed needing to increase her regular insulin if postprandial readings are high  Check at least half of the readings after meals rather than before breakfast  Continue building up an exercise program for weight loss  A1c on the next visit  No change in Lantus since some of her readings in the mornings but as low as 79 and lab glucose was fairly good  Lipids: Since her control is significantly poor with using Crestor alone will add WelChol. This may help her diabetes also Discussed how this works, how to mix to [employer] powder, benefits, possible side effects and given literature on the medication  Hypertension: Blood pressure is better controlled now  Counseling time over 50% of today's 25 minute visit  Hernan Turnage 03/14/2013, 10:39 AM

## 2013-03-17 MED ORDER — COLESEVELAM HCL 3.75 G PO PACK: 3.7500 g | PACK | Freq: Every day | ORAL | Status: DC

## 2013-03-27 ENCOUNTER — Ambulatory Visit: Payer: 59 | Admitting: Neurology

## 2013-04-30 ENCOUNTER — Ambulatory Visit (INDEPENDENT_AMBULATORY_CARE_PROVIDER_SITE_OTHER): Payer: Medicare Other | Admitting: Podiatry

## 2013-04-30 ENCOUNTER — Encounter: Payer: Self-pay | Admitting: Podiatry

## 2013-04-30 VITALS — BP 152/66 | HR 86 | Resp 12

## 2013-04-30 DIAGNOSIS — B351 Tinea unguium: Secondary | ICD-10-CM

## 2013-04-30 DIAGNOSIS — M79609 Pain in unspecified limb: Secondary | ICD-10-CM

## 2013-04-30 NOTE — Patient Instructions (Signed)
Diabetes and Foot Care Diabetes may cause you to have problems because of poor blood supply (circulation) to your feet and legs. This may cause the skin on your feet to become thinner, break easier, and heal more slowly. Your skin may become dry, and the skin may peel and crack. You may also have nerve damage in your legs and feet causing decreased feeling in them. You may not notice minor injuries to your feet that could lead to infections or more serious problems. Taking care of your feet is one of the most important things you can do for yourself.  HOME CARE INSTRUCTIONS  Wear shoes at all times, even in the house. Do not go barefoot. Bare feet are easily injured.  Check your feet daily for blisters, cuts, and redness. If you cannot see the bottom of your feet, use a mirror or ask someone for help.  Wash your feet with warm water (do not use hot water) and mild soap. Then pat your feet and the areas between your toes until they are completely dry. Do not soak your feet as this can dry your skin.  Apply a moisturizing lotion or petroleum jelly (that does not contain alcohol and is unscented) to the skin on your feet and to dry, brittle toenails. Do not apply lotion between your toes.  Trim your toenails straight across. Do not dig under them or around the cuticle. File the edges of your nails with an emery board or nail file.  Do not cut corns or calluses or try to remove them with medicine.  Wear clean socks or stockings every day. Make sure they are not too tight. Do not wear knee-high stockings since they may decrease blood flow to your legs.  Wear shoes that fit properly and have enough cushioning. To break in new shoes, wear them for just a few hours a day. This prevents you from injuring your feet. Always look in your shoes before you put them on to be sure there are no objects inside.  Do not cross your legs. This may decrease the blood flow to your feet.  If you find a minor scrape,  cut, or break in the skin on your feet, keep it and the skin around it clean and dry. These areas may be cleansed with mild soap and water. Do not cleanse the area with peroxide, alcohol, or iodine.  When you remove an adhesive bandage, be sure not to damage the skin around it.  If you have a wound, look at it several times a day to make sure it is healing.  Do not use heating pads or hot water bottles. They may burn your skin. If you have lost feeling in your feet or legs, you may not know it is happening until it is too late.  Make sure your health care provider performs a complete foot exam at least annually or more often if you have foot problems. Report any cuts, sores, or bruises to your health care provider immediately. SEEK MEDICAL CARE IF:   You have an injury that is not healing.  You have cuts or breaks in the skin.  You have an ingrown nail.  You notice redness on your legs or feet.  You feel burning or tingling in your legs or feet.  You have pain or cramps in your legs and feet.  Your legs or feet are numb.  Your feet always feel cold. SEEK IMMEDIATE MEDICAL CARE IF:   There is increasing redness,   swelling, or pain in or around a wound.  There is a red line that goes up your leg.  Pus is coming from a wound.  You develop a fever or as directed by your health care provider.  You notice a bad smell coming from an ulcer or wound. Document Released: 01/16/2000 Document Revised: 09/20/2012 Document Reviewed: 06/27/2012 [employer] Patient Information 2014 [employer].  

## 2013-04-30 NOTE — Progress Notes (Signed)
   Subjective:    Patient ID: Heidi Campbell, female    DOB: 31-Dec-1945, 68 y.o.   MRN: [id number]  HPI PT STATED RT FOOT GREAT TOENAIL IS SORE AND CURVING IN FOR 3 MONTHS. THE TOE IS GETTING WORSE. THE TOENAIL GET AGGRAVATED BY WEARING SHOES. TRIED NO TREATMENT. This patient was last evaluated in our office on 02/06/2011.  Review of Systems  HENT: Positive for hearing loss.   Respiratory: Positive for shortness of breath.   Genitourinary: Positive for frequency.  Allergic/Immunologic: Positive for environmental allergies.  Neurological: Positive for weakness.  All other systems reviewed and are negative.       Objective:   Physical Exam Orientated [phone] 68 year old female  Vascular: DP and PT pulses 2/4 bilaterally  Neurological: Sensation detained in monofilament wire intact 5/5 bilaterally. Vibratory sensation intact bilaterally. Ankle reflexes reactive bilaterally.  Dermatological: The toenails x10 are discolored, incurvated and hypertrophic.  Musculoskeletal lower HAV and hammered second, third, fourth, fifth toes left noted       Assessment & Plan:    Assessment: Satisfactory neurovascular status Diabetes without any complications Symptomatic onychomycoses x10  Plan: Nails x10 are debrided without any bleeding. Reappoint at three-month intervals.

## 2013-05-01 ENCOUNTER — Encounter: Payer: Self-pay | Admitting: Podiatry

## 2013-05-11 ENCOUNTER — Other Ambulatory Visit (INDEPENDENT_AMBULATORY_CARE_PROVIDER_SITE_OTHER): Payer: 59

## 2013-05-11 DIAGNOSIS — IMO0001 Reserved for inherently not codable concepts without codable children: Secondary | ICD-10-CM

## 2013-05-11 DIAGNOSIS — E1165 Type 2 diabetes mellitus with hyperglycemia: Principal | ICD-10-CM

## 2013-05-11 DIAGNOSIS — E039 Hypothyroidism, unspecified: Secondary | ICD-10-CM

## 2013-05-11 DIAGNOSIS — E782 Mixed hyperlipidemia: Secondary | ICD-10-CM

## 2013-05-11 LAB — LIPID PANEL
CHOL/HDL RATIO: 5
Cholesterol: 257 mg/dL — ABNORMAL HIGH (ref 0–200)
HDL: 49.7 mg/dL (ref >39.00)
LDL Cholesterol: 169 mg/dL — ABNORMAL HIGH (ref 0–99)
Triglycerides: 193 mg/dL — ABNORMAL HIGH (ref 0.0–149.0)
VLDL: 38.6 mg/dL (ref 0.0–40.0)

## 2013-05-11 LAB — T4, FREE: FREE T4: 0.65 ng/dL (ref 0.60–1.60)

## 2013-05-11 LAB — COMPREHENSIVE METABOLIC PANEL
ALBUMIN: 3.6 g/dL (ref 3.5–5.2)
ALT: 24 U/L (ref 0–35)
AST: 29 U/L (ref 0–37)
Alkaline Phosphatase: 63 U/L (ref 39–117)
BUN: 20 mg/dL (ref 6–23)
CALCIUM: 9.4 mg/dL (ref 8.4–10.5)
CO2: 27 meq/L (ref 19–32)
Chloride: 105 mEq/L (ref 96–112)
Creatinine, Ser: 1.1 mg/dL (ref 0.4–1.2)
GFR: 65.56 mL/min (ref >60.00)
Glucose, Bld: 135 mg/dL — ABNORMAL HIGH (ref 70–99)
Potassium: 3.8 mEq/L (ref 3.5–5.1)
Sodium: 140 mEq/L (ref 135–145)
TOTAL PROTEIN: 6.9 g/dL (ref 6.0–8.3)
Total Bilirubin: 0.6 mg/dL (ref 0.3–1.2)

## 2013-05-11 LAB — HEMOGLOBIN A1C: Hgb A1c MFr Bld: 8.7 % — ABNORMAL HIGH (ref 4.6–6.5)

## 2013-05-11 LAB — TSH: TSH: 3.28 u[IU]/mL (ref 0.35–5.50)

## 2013-05-14 ENCOUNTER — Ambulatory Visit (INDEPENDENT_AMBULATORY_CARE_PROVIDER_SITE_OTHER): Payer: 59 | Admitting: Endocrinology

## 2013-05-14 ENCOUNTER — Encounter: Payer: Self-pay | Admitting: Endocrinology

## 2013-05-14 ENCOUNTER — Encounter: Payer: 59 | Attending: Endocrinology | Admitting: Nutrition

## 2013-05-14 VITALS — BP 124/82 | HR 58 | Temp 97.6°F | Resp 16 | Ht 62.5 in | Wt 240.6 lb

## 2013-05-14 DIAGNOSIS — Z794 Long term (current) use of insulin: Secondary | ICD-10-CM | POA: Insufficient documentation

## 2013-05-14 DIAGNOSIS — E119 Type 2 diabetes mellitus without complications: Secondary | ICD-10-CM

## 2013-05-14 DIAGNOSIS — I10 Essential (primary) hypertension: Secondary | ICD-10-CM

## 2013-05-14 DIAGNOSIS — E1165 Type 2 diabetes mellitus with hyperglycemia: Principal | ICD-10-CM

## 2013-05-14 DIAGNOSIS — E7849 Other hyperlipidemia: Secondary | ICD-10-CM | POA: Insufficient documentation

## 2013-05-14 DIAGNOSIS — IMO0001 Reserved for inherently not codable concepts without codable children: Secondary | ICD-10-CM

## 2013-05-14 DIAGNOSIS — Z713 Dietary counseling and surveillance: Secondary | ICD-10-CM | POA: Insufficient documentation

## 2013-05-14 DIAGNOSIS — E785 Hyperlipidemia, unspecified: Secondary | ICD-10-CM

## 2013-05-14 NOTE — Patient Instructions (Addendum)
Please check blood sugars at least half the time about 2 hours after any meal and as directed on waking up. Please bring blood sugar monitor to each visit  Start VICTOZA injection with the sample pen once daily at the same time of the day.  Dial the dose to 0.6 mg for the first week.  You may  experience nausea in the first few days which usually gets better   After 1 week increase the dose to 1.2mg  daily if no nausea.  You may inject in the stomach, thigh or arm.    You will feel fullness of the stomach with starting the medication and should try to keep portions of food small.   Lantus 36 in pm   8 Humalog in am

## 2013-05-14 NOTE — Progress Notes (Signed)
Patient ID: Heidi Campbell, female   DOB: [birth year], 68 y.o.   MRN: [id number]   Reason for Appointment: Diabetes follow-up   History of Present Illness   Diagnosis: Type 2 DIABETES MELITUS, date of diagnosis: 1998      Previous history: She was initially treated with oral hypoglycemic drugs but switched to insulin when she was hospitalized for asthma in 2007. She has been on metformin but only 500 mg twice a day Over most of her diabetes history has been taking premixed insulin, NovoLog mix 70/30, twice a day Although her A1c in the past has been close to 7% she would tend to have hyperglycemia at 11 AM or overnight with his insulin Her  A1c in 2014 was 6.8 in 4/14  Recent history: Her blood sugars are persistently poorly controlled and her A1c is only slightly better She has been on mealtime coverage also in addition to her Lantus insulin since 02/2013 However appears to have very little understanding of taking mealtime insulin or adjustment of the dose For some reason she has stopped taking her Humalog in the morning and is taking twice as much coverage for her dinner She seems to be compliant with her Lantus 34 units in the morning  On her last visit in 1/15 she was given regular insulin in addition to her Lantus because of overall high readings especially non-fasting She is eating 2 meals a day and is taking the regular insulin about 30 minutes before breakfast and supper as directed A1c had been much higher in 12/14  She was previously taking 35 units twice a day of premixed insulin Also she is taking metformin, now 1500 mg without side effects She is checking her blood sugars mostly before breakfast and suppertime despite reminders to do better She has lost some weight since her last visit also     Oral hypoglycemic drugs: Metformin ER1500 mg         Side effects from medications:  occasional diarrhea from metformin Insulin regimen:  34 Lantus at breakfast, Humalog 20 a.c. supper     Proper timing of medications in relation to meals: Yes, 30 min ac.          Monitors blood glucose: Once a day.    Glucometer: [employer].          Blood Glucose readings   PREMEAL Breakfast Lunch Dinner Bedtime Overall  Glucose range:  121-288  184   101   119-321    Mean/median:  170      157     Hypoglycemia frequency:  none         Meals:  usually 2 meals per day, not eating lunch. Supper 7 pm; she thinks her portions are usually small         Physical activity: exercise: Minimal recently because of knee joint pains           Dietician visit: Most recent: 1998 ?     Weight control:  Wt Readings from Last 3 Encounters:  05/14/13 240 lb 9.6 oz (109.135 kg)  03/14/13 239 lb 12.8 oz (108.773 kg)  02/19/13 234 lb 8 oz (106.369 kg)        Diabetes labs:  Lab Results  Component Value Date   HGBA1C 8.7* 05/11/2013   HGBA1C 9.5* 01/17/2013   Lab Results  Component Value Date   MICROALBUR 2.3* 03/08/2013   LDLCALC 169* 05/11/2013   CREATININE 1.1 05/11/2013     Complications: None  Last eye exam: 02/2012     Medication List       This list is accurate as of: 05/14/13 10:30 AM.  Always use your most recent med list.               amLODipine 10 MG tablet  Commonly known as:  NORVASC  Take 10 mg by mouth daily.     aspirin 81 MG tablet  Take 81 mg by mouth daily.     cloNIDine 0.2 MG tablet  Commonly known as:  CATAPRES  Take 0.2 mg by mouth daily. If your systolic BP is greater than 160 you can take an extra 0.2 mg tablet that day.     Colesevelam HCl 3.75 G Pack  Take 1 packet (3.8 g total) by mouth daily.     glucose blood test strip  Commonly known as:  ONETOUCH VERIO  Use as instructed to check blood sugars 2 times per day, dx code 250.00     HYDROcodone-acetaminophen 5-325 MG per tablet  Commonly known as:  NORCO/VICODIN  Take 1 tablet by mouth every 6 (six) hours as needed for pain.     Insulin Glargine 100 UNIT/ML Solostar Pen  Commonly known as:   LANTUS  Inject 20 Units into the skin daily.     insulin lispro 100 UNIT/ML injection  Commonly known as:  HUMALOG  Inject 20 Units into the skin 2 (two) times daily.     irbesartan-hydrochlorothiazide 150-12.5 MG per tablet  Commonly known as:  AVALIDE  take 2 tablets by mouth once daily     levothyroxine 50 MCG tablet  Commonly known as:  SYNTHROID, LEVOTHROID  Take 50 mcg by mouth daily.     metFORMIN 500 MG 24 hr tablet  Commonly known as:  GLUCOPHAGE XR  Take 3 tablets daily     metoprolol succinate 100 MG 24 hr tablet  Commonly known as:  TOPROL-XL  Take 100 mg by mouth daily. Take with or immediately following a meal.     rosuvastatin 40 MG tablet  Commonly known as:  CRESTOR  Take 1 tablet (40 mg total) by mouth daily.        Allergies:  Allergies  Allergen Reactions  . Shellfish Allergy     Pt cannot recall at this time    Past Medical History  Diagnosis Date  . Hypertension   . Diabetes mellitus without complication   . ALLERGIC RHINITIS   . ASTHMA   . Esophageal reflux   . SINUSITIS, ACUTE     Past Surgical History  Procedure Laterality Date  . Abdominal hysterectomy    . Breast surgery      Family History  Problem Relation Age of Onset  . Diabetes Mother   . Heart attack Father   . Heart disease Father     Social History:  reports that she has never smoked. She does not have any smokeless tobacco history on file. She reports that she does not drink alcohol or use illicit drugs.  Review of Systems:  Hypertension:  has good control with amlodipine, clonidine and Avalide, followed by PCP  Renal function: Creatinine normal  Lipids: Have not been controlled well . Does have a history of CAD also  Lab Results  Component Value Date   CHOL 257* 05/11/2013   HDL 49.70 05/11/2013   LDLCALC 169* 05/11/2013   LDLDIRECT 164.3 03/08/2013   TRIG 193.0* 05/11/2013   CHOLHDL 5 05/11/2013    Neuropathy: She previously  had a burning pain in her feet  treated with gabapentin, no symptoms recently     LABS:  Appointment on 05/11/2013  Component Date Value Ref Range Status  . Hemoglobin A1C 05/11/2013 8.7* 4.6 - 6.5 % Final   Glycemic Control Guidelines for People with Diabetes:Non Diabetic:  <6%Goal of Therapy: <7%Additional Action Suggested:  >8%   . Sodium 05/11/2013 140  135 - 145 mEq/L Final  . Potassium 05/11/2013 3.8  3.5 - 5.1 mEq/L Final  . Chloride 05/11/2013 105  96 - 112 mEq/L Final  . CO2 05/11/2013 27  19 - 32 mEq/L Final  . Glucose, Bld 05/11/2013 135* 70 - 99 mg/dL Final  . BUN 16/11/9602 20  6 - 23 mg/dL Final  . Creatinine, Ser 05/11/2013 1.1  0.4 - 1.2 mg/dL Final  . Total Bilirubin 05/11/2013 0.6  0.3 - 1.2 mg/dL Final  . Alkaline Phosphatase 05/11/2013 63  39 - 117 U/L Final  . AST 05/11/2013 29  0 - 37 U/L Final  . ALT 05/11/2013 24  0 - 35 U/L Final  . Total Protein 05/11/2013 6.9  6.0 - 8.3 g/dL Final  . Albumin 54/10/8117 3.6  3.5 - 5.2 g/dL Final  . Calcium 14/78/2956 9.4  8.4 - 10.5 mg/dL Final  . GFR 21/30/8657 65.56  >60.00 mL/min Final  . Cholesterol 05/11/2013 257* 0 - 200 mg/dL Final   ATP III Classification       Desirable:  < 200 mg/dL               Borderline High:  200 - 239 mg/dL          High:  > = 846 mg/dL  . Triglycerides 05/11/2013 193.0* 0.0 - 149.0 mg/dL Final   Normal:  <962 mg/dLBorderline High:  150 - 199 mg/dL  . HDL 05/11/2013 49.70  >39.00 mg/dL Final  . VLDL 95/28/4132 38.6  0.0 - 40.0 mg/dL Final  . LDL Cholesterol 05/11/2013 169* 0 - 99 mg/dL Final  . Total CHOL/HDL Ratio 05/11/2013 5   Final                  Men          Women1/2 Average Risk     3.4          3.3Average Risk          5.0          4.42X Average Risk          9.6          7.13X Average Risk          15.0          11.0                      . TSH 05/11/2013 3.28  0.35 - 5.50 uIU/mL Final  . Free T4 05/11/2013 0.65  0.60 - 1.60 ng/dL Final     Examination:   BP 124/82  Pulse 58  Temp(Src) 97.6 F (36.4 C)   Resp 16  Ht 5' 2.5" (1.588 m)  Wt 240 lb 9.6 oz (109.135 kg)  BMI 43.28 kg/m2  SpO2 97%  Body mass index is 43.28 kg/(m^2).    ASSESSMENT/ PLAN:    Diabetes type 2:  Blood glucose control is slightly better although she has had very inconsistent readings in the morning which she cannot explain She has sporadic high readings and evenings despite taking 20 units of  mealtime coverage which she thinks she is consistent with She appears to have very little understanding of insulin and needs general diabetes education also She has difficulty losing weight especially recently without any exercise Currently also on metformin 1500 mg a day She is a good candidate for a GLP-1 drug which would help her compliance with diet, postprandial hyperglycemia and weight loss  Recommendations:  She will switch her Lantus to the evening since fasting readings are relatively higher and increase the dose by 2 units for now  Check at least half of the readings after meals rather than before breakfast Discussed with the patient the nature of GLP-1 drugs, the action on various organ systems, how they benefit blood glucose control, as well as the benefit of weight loss and  increase satiety . Explained possible side effects especially nausea and vomiting; discussed safety information in package insert. Described injection technique and dosage titration of Victoza  starting with 0.6 mg once a day at the same time for the first week and then increasing to 1.2 mg if no symptoms of nausea. Patient brochure on Victoza and co-pay card given. Also given basic education by nurse educator today Start taking 8 units of NovoLog before breakfast  Reduce suppertime dose to 16 units as she will probably need less insulin with starting Victoza  Lipids: She is noncompliant with her medications and will need to get back on a regimen  Hypertension: Blood pressure is better controlled   Counseling time over 50% of today's 25 minute  visit  Reather Littler 05/14/2013, 10:30 AM

## 2013-05-15 ENCOUNTER — Telehealth: Payer: Self-pay | Admitting: Endocrinology

## 2013-05-15 NOTE — Telephone Encounter (Signed)
Cholesterol is much higher than expected, need to know exactly how she is taking her Crestor 40 mg and WelChol and if she ran out recently

## 2013-05-15 NOTE — Patient Instructions (Addendum)
Take Humalog 10-15 min. before breakfast and supper Take lantus as directed once daily Test blood sugar before meals and at bedtime Take Victoza once daily--at the same time each day.   Take 0.6 of Victoza, and increase the dose after 7 days if no nausea to 1.2 Read over Victoza information and call if questions

## 2013-05-15 NOTE — Progress Notes (Signed)
Patient is her to learn to how to take the Victoza and to review her insulin doses/timing. She has been taking her Humalog only before breakfast and not before supper.    We reviewed how the [place] works, how to take it, and how to increase the dosage.  She reported good understanding of this.  She was given a sample of the Victoza (NDC# 8177-1165-79,UXY #: [id number]., exp.:8/16) with the Novofine needles, and a Victoza starter kit with directions for pen use and information about the medication, and support.  We reviewed how to take this, dialing 0.6 and taking this amount once a day for 7 days.  And to increase the dose to 1.2 after 7 days if no nausea.  She reported good understanding of this and had no final questions.    We also reviewed the need to take Humalog before breakfast and supper.  She is eating only 2 meals.  We discussed the fact that this insulin is taken 10-15 min.  Before eating, and it is used to prevent the blood  sugar from going high after the meal.  She agreed that her supper meal is large-- having 45-60 grams of carbohydrate at this meal, and 15-30 grams for HS snack.  She agreed to do this.  We reviewed the new doses she needs to be taking of the Lantus and Humalog.  Written instructions were given for this.

## 2013-05-22 NOTE — Telephone Encounter (Signed)
Patient says she had ran out of the crestor before she came in for labs, but she is now taking crestor and welchol together in the mornings.

## 2013-05-22 NOTE — Telephone Encounter (Signed)
noted 

## 2013-06-11 ENCOUNTER — Other Ambulatory Visit: Payer: 59

## 2013-06-14 ENCOUNTER — Ambulatory Visit: Payer: 59 | Admitting: Endocrinology

## 2013-07-03 ENCOUNTER — Other Ambulatory Visit (INDEPENDENT_AMBULATORY_CARE_PROVIDER_SITE_OTHER): Payer: 59

## 2013-07-03 DIAGNOSIS — IMO0001 Reserved for inherently not codable concepts without codable children: Secondary | ICD-10-CM

## 2013-07-03 DIAGNOSIS — E1165 Type 2 diabetes mellitus with hyperglycemia: Principal | ICD-10-CM

## 2013-07-03 LAB — BASIC METABOLIC PANEL
BUN: 22 mg/dL (ref 6–23)
CALCIUM: 9.6 mg/dL (ref 8.4–10.5)
CO2: 28 mEq/L (ref 19–32)
Chloride: 105 mEq/L (ref 96–112)
Creatinine, Ser: 1.2 mg/dL (ref 0.4–1.2)
GFR: 58.54 mL/min — AB (ref >60.00)
Glucose, Bld: 99 mg/dL (ref 70–99)
Potassium: 4.4 mEq/L (ref 3.5–5.1)
SODIUM: 140 meq/L (ref 135–145)

## 2013-07-03 LAB — LIPID PANEL
Cholesterol: 263 mg/dL — ABNORMAL HIGH (ref 0–200)
HDL: 48.1 mg/dL (ref >39.00)
LDL CALC: 187 mg/dL — AB (ref 0–99)
TRIGLYCERIDES: 142 mg/dL (ref 0.0–149.0)
Total CHOL/HDL Ratio: 5
VLDL: 28.4 mg/dL (ref 0.0–40.0)

## 2013-07-03 LAB — URINALYSIS, ROUTINE W REFLEX MICROSCOPIC
HGB URINE DIPSTICK: NEGATIVE
Nitrite: NEGATIVE
PH: 6 (ref 5.0–8.0)
RBC / HPF: NONE SEEN (ref 0–?)
Specific Gravity, Urine: >=1.03 — AB (ref 1.000–1.030)
TOTAL PROTEIN, URINE-UPE24: 30 — AB
Urine Glucose: NEGATIVE
Urobilinogen, UA: 1 (ref 0.0–1.0)

## 2013-07-04 LAB — MICROALBUMIN / CREATININE URINE RATIO
Creatinine,U: 627.6 mg/dL
MICROALB UR: 6.9 mg/dL — AB (ref 0.0–1.9)
Microalb Creat Ratio: 1.1 mg/g (ref 0.0–30.0)

## 2013-07-09 ENCOUNTER — Ambulatory Visit: Payer: 59 | Admitting: Endocrinology

## 2013-07-20 ENCOUNTER — Encounter: Payer: Self-pay | Admitting: Endocrinology

## 2013-07-20 ENCOUNTER — Ambulatory Visit (INDEPENDENT_AMBULATORY_CARE_PROVIDER_SITE_OTHER): Payer: 59 | Admitting: Endocrinology

## 2013-07-20 ENCOUNTER — Other Ambulatory Visit: Payer: Self-pay | Admitting: *Deleted

## 2013-07-20 VITALS — BP 132/96 | HR 62 | Temp 98.0°F | Resp 16 | Ht 62.5 in | Wt 232.2 lb

## 2013-07-20 DIAGNOSIS — E785 Hyperlipidemia, unspecified: Secondary | ICD-10-CM

## 2013-07-20 DIAGNOSIS — IMO0001 Reserved for inherently not codable concepts without codable children: Secondary | ICD-10-CM

## 2013-07-20 DIAGNOSIS — I10 Essential (primary) hypertension: Secondary | ICD-10-CM

## 2013-07-20 DIAGNOSIS — E1165 Type 2 diabetes mellitus with hyperglycemia: Principal | ICD-10-CM

## 2013-07-20 MED ORDER — VICTOZA 18 MG/3ML ~~LOC~~ SOPN: 1.2000 mg | PEN_INJECTOR | Freq: Every day | SUBCUTANEOUS | Status: DC

## 2013-07-20 MED ORDER — INSULIN GLARGINE 100 UNIT/ML ~~LOC~~ SOLN: SUBCUTANEOUS | Status: DC

## 2013-07-20 MED ORDER — EZETIMIBE 10 MG PO TABS: 10.0000 mg | ORAL_TABLET | Freq: Every day | ORAL | Status: AC

## 2013-07-20 NOTE — Patient Instructions (Signed)
Please check blood sugars at least half the time about 2 hours after any meal and times per week on waking up. Please bring blood sugar monitor to each visit  Lantus adjust based on am sugars, keep them 90-130  Overall  R insulin 30 min before meals; may reduce to 16 units with Victoza 1.2  Change Welchol to Zetia

## 2013-07-20 NOTE — Progress Notes (Addendum)
Patient ID: Heidi Campbell, female   DOB: 07-17-1945, 68 y.o.   MRN: [id number]   Reason for Appointment: Diabetes follow-up   History of Present Illness   Diagnosis: Type 2 DIABETES MELITUS, date of diagnosis: 1998      Previous history: She was initially treated with oral hypoglycemic drugs but switched to insulin when she was hospitalized for asthma in 2007. She has been on metformin but only 500 mg twice a day Over most of her diabetes history has been taking premixed insulin, NovoLog mix 70/30, twice a day Although her A1c in the past has been close to 7% she would tend to have hyperglycemia at 11 AM or overnight with his insulin Her  A1c in 2014 was 6.8 in 4/14  Recent history:  She was told to start Victoza  on her visit in 4/15 but she only did a sample and did not call for the prescription She thinks it helped her control her appetite but is not sure if she had nausea from it because she is also taking medication for knee pain She was given Victoza because of persistent poor control and she is not clear if her sugars were better at that time Also for some reason her pharmacist has given her regular insulin instead of Humalog She had been done to restart her mealtime insulin at breakfast also if she is doing this She seems to be compliant with her Lantus 36 units in the evening, previously taking it in the morning However she has been a little more active after any surgery and has lost a little weight Also she is taking metformin, now 1500 mg without side effects She is checking her blood sugars mostly before breakfast and only occasionally nonfasting She has lost some weight since her last visit also     Oral hypoglycemic drugs: Metformin ER1500 mg         Side effects from medications:  occasional diarrhea from metformin Insulin regimen:  36 Lantus at breakfast, R insulin 8 units breakfast and 20 a.c. supper    Proper timing of medications in relation to meals: Yes, 30 min ac.           Monitors blood glucose: Once a day.    Glucometer: [employer] Verio.          Blood Glucose readings   PREMEAL Breakfast Lunch Dinner  7-10 PM  Overall  Glucose range:  87-173   260   123, 155   131-214    Median:  140      147      Hypoglycemia frequency:  none         Meals:  usually 2 meals per day, not eating lunch. Supper 7 pm; she thinks her portions are usually small         Physical activity: exercise:            Dietician visit: Most recent: 1998 ?     Weight control:  Wt Readings from Last 3 Encounters:  07/20/13 232 lb 3.2 oz (105.325 kg)  05/14/13 240 lb 9.6 oz (109.135 kg)  03/14/13 239 lb 12.8 oz (108.773 kg)        Diabetes labs:  Lab Results  Component Value Date   HGBA1C 8.7* 05/11/2013   HGBA1C 9.5* 01/17/2013   Lab Results  Component Value Date   MICROALBUR 6.9* 07/03/2013   LDLCALC 187* 07/03/2013   CREATININE 1.2 07/03/2013     Complications: None   Last  eye exam: 02/2012     Medication List       This list is accurate as of: 07/20/13  8:45 AM.  Always use your most recent med list.               amLODipine 10 MG tablet  Commonly known as:  NORVASC  Take 10 mg by mouth daily.     aspirin 81 MG tablet  Take 81 mg by mouth daily.     cloNIDine 0.2 MG tablet  Commonly known as:  CATAPRES  Take 0.2 mg by mouth daily. If your systolic BP is greater than 160 you can take an extra 0.2 mg tablet that day.     Colesevelam HCl 3.75 G Pack  Take 1 packet (3.8 g total) by mouth daily.     glucose blood test strip  Commonly known as:  ONETOUCH VERIO  Use as instructed to check blood sugars 2 times per day, dx code 250.00     HYDROcodone-acetaminophen 5-325 MG per tablet  Commonly known as:  NORCO/VICODIN  Take 1 tablet by mouth every 6 (six) hours as needed for pain.     Insulin Glargine 100 UNIT/ML Solostar Pen  Commonly known as:  LANTUS  Inject 36 Units into the skin daily.     insulin lispro 100 UNIT/ML injection  Commonly known as:   HUMALOG  Inject 20 Units into the skin 2 (two) times daily.     irbesartan-hydrochlorothiazide 150-12.5 MG per tablet  Commonly known as:  AVALIDE  take 2 tablets by mouth once daily     levothyroxine 50 MCG tablet  Commonly known as:  SYNTHROID, LEVOTHROID  Take 50 mcg by mouth daily.     metFORMIN 500 MG 24 hr tablet  Commonly known as:  GLUCOPHAGE XR  Take 3 tablets daily     metoprolol succinate 100 MG 24 hr tablet  Commonly known as:  TOPROL-XL  Take 100 mg by mouth daily. Take with or immediately following a meal.     rosuvastatin 40 MG tablet  Commonly known as:  CRESTOR  Take 1 tablet (40 mg total) by mouth daily.        Allergies:  Allergies  Allergen Reactions  . Shellfish Allergy     Pt cannot recall at this time    Past Medical History  Diagnosis Date  . Hypertension   . Diabetes mellitus without complication   . ALLERGIC RHINITIS   . ASTHMA   . Esophageal reflux   . SINUSITIS, ACUTE     Past Surgical History  Procedure Laterality Date  . Abdominal hysterectomy    . Breast surgery      Family History  Problem Relation Age of Onset  . Diabetes Mother   . Heart attack Father   . Heart disease Father     Social History:  reports that she has never smoked. She does not have any smokeless tobacco history on file. She reports that she does not drink alcohol or use illicit drugs.  Review of Systems:  Hypertension:  has good control with amlodipine, clonidine and Avalide, followed by PCP  Renal function: Creatinine normal  Lipids: Have not been controlled well . Does have a history of CAD also. She again claims that she is taking her Crestor and WelChol every day even though previously she admitted to be irregular with this  Lab Results  Component Value Date   CHOL 263* 07/03/2013   HDL 48.10 07/03/2013   LDLCALC  187* 07/03/2013   LDLDIRECT 164.3 03/08/2013   TRIG 142.0 07/03/2013   CHOLHDL 5 07/03/2013    Neuropathy: She previously had a burning  pain in her feet treated with gabapentin, no symptoms recently     LABS:  No visits with results within 1 Week(s) from this visit. Latest known visit with results is:  Appointment on 07/03/2013  Component Date Value Ref Range Status  . Sodium 07/03/2013 140  135 - 145 mEq/L Final  . Potassium 07/03/2013 4.4  3.5 - 5.1 mEq/L Final  . Chloride 07/03/2013 105  96 - 112 mEq/L Final  . CO2 07/03/2013 28  19 - 32 mEq/L Final  . Glucose, Bld 07/03/2013 99  70 - 99 mg/dL Final  . BUN 16/11/9602 22  6 - 23 mg/dL Final  . Creatinine, Ser 07/03/2013 1.2  0.4 - 1.2 mg/dL Final  . Calcium 54/10/8117 9.6  8.4 - 10.5 mg/dL Final  . GFR 14/78/2956 58.54* >60.00 mL/min Final  . Cholesterol 07/03/2013 263* 0 - 200 mg/dL Final   ATP III Classification       Desirable:  < 200 mg/dL               Borderline High:  200 - 239 mg/dL          High:  > = 213 mg/dL  . Triglycerides 07/03/2013 142.0  0.0 - 149.0 mg/dL Final   Normal:  <086 mg/dLBorderline High:  150 - 199 mg/dL  . HDL 07/03/2013 48.10  >39.00 mg/dL Final  . VLDL 57/84/6962 28.4  0.0 - 40.0 mg/dL Final  . LDL Cholesterol 07/03/2013 187* 0 - 99 mg/dL Final  . Total CHOL/HDL Ratio 07/03/2013 5   Final                  Men          Women1/2 Average Risk     3.4          3.3Average Risk          5.0          4.42X Average Risk          9.6          7.13X Average Risk          15.0          11.0                      . Microalb, Ur 07/03/2013 6.9* 0.0 - 1.9 mg/dL Final  . Creatinine,U 95/28/4132 627.6 Verified by manual dilution.   Final  . Microalb Creat Ratio 07/03/2013 1.1  0.0 - 30.0 mg/g Final  . Color, Urine 07/03/2013 YELLOW  Yellow;Lt. Yellow Final  . APPearance 07/03/2013 CLEAR  Clear Final  . Specific Gravity, Urine 07/03/2013 >=1.030* 1.000 - 1.030 Final  . pH 07/03/2013 6.0  5.0 - 8.0 Final  . Total Protein, Urine 07/03/2013 30* Negative Final  . Urine Glucose 07/03/2013 NEGATIVE  Negative Final  . Ketones, ur 07/03/2013 TRACE*  Negative Final  . Bilirubin Urine 07/03/2013 SMALL* Negative Final  . Hgb urine dipstick 07/03/2013 NEGATIVE  Negative Final  . Urobilinogen, UA 07/03/2013 1.0  0.0 - 1.0 Final  . Leukocytes, UA 07/03/2013 SMALL* Negative Final  . Nitrite 07/03/2013 NEGATIVE  Negative Final  . WBC, UA 07/03/2013 7-10/hpf* 0-2/hpf Final  . RBC / HPF 07/03/2013 none seen  0-2/hpf Final  . Mucus, UA 07/03/2013 Presence of* None Final  .  Squamous Epithelial / LPF 07/03/2013 Rare(0-4/hpf)  Rare(0-4/hpf) Final     Examination:   BP 132/96  Pulse 62  Temp(Src) 98 F (36.7 C)  Resp 16  Ht 5' 2.5" (1.588 m)  Wt 232 lb 3.2 oz (105.325 kg)  BMI 41.77 kg/m2  SpO2 98%  Body mass index is 41.77 kg/(m^2).    ASSESSMENT/ PLAN:    Diabetes type 2:  Blood glucose control is difficult to assess as she has not been checking blood sugars consistently and mostly before her first meal She did not continue the Victoza that was started in April Currently her fasting blood sugars are overall better although not consistent She was to be having significant postprandial hyperglycemia but not checking enough A1c and fructosamine not available to assess objectively Recommendations: Day-to-day management was discussed with patient including need for consistent diet and exercise Since she thinks she can watch her portions better with the Victoza will go ahead and send her a prescription for 1.2 mg daily. Discussed dosage titration and possible side effects of nausea, discussed timing of the injection and titration  Reduce suppertime dose to 16 units as she will probably need less insulin with starting Victoza  More consistent monitoring after meals instead of just in the morning  Regular walking  Call if blood sugar not improved  She can continue to use regular insulin for now taking 30 minutes before eating. Consider reducing dose further but also may need to add insulin at lunchtime needed  If she is having tendency  to hypoglycemia before meals are overnight and switch her back Humalog  Lipids: She claims she is compliant with her medications but do not think this is likely since her LDL is higher than before Will try her on Zetia instead of WelChol and continue Crestor  Hypertension: Blood pressure is relatively higher today, needs to followup with PCP Does not have microalbuminuria at this time  Counseling time over 50% of today's 25 minute visit  KUMAR,AJAY 07/20/2013, 8:45 AM

## 2013-07-30 ENCOUNTER — Encounter: Payer: Self-pay | Admitting: Podiatry

## 2013-07-30 ENCOUNTER — Ambulatory Visit (INDEPENDENT_AMBULATORY_CARE_PROVIDER_SITE_OTHER): Payer: Medicare Other | Admitting: Podiatry

## 2013-07-30 VITALS — BP 107/72 | HR 85 | Resp 16 | Ht 63.0 in | Wt 230.0 lb

## 2013-07-30 DIAGNOSIS — M79673 Pain in unspecified foot: Secondary | ICD-10-CM

## 2013-07-30 DIAGNOSIS — M79609 Pain in unspecified limb: Secondary | ICD-10-CM

## 2013-07-30 DIAGNOSIS — B351 Tinea unguium: Secondary | ICD-10-CM

## 2013-07-31 NOTE — Progress Notes (Signed)
Patient ID: Heidi Campbell, female   DOB: 1945/06/03, 68 y.o.   MRN: [id number]  Subjective: Orientated x3 female presents complaining of painful toenails  Objective: Hypertrophic, incurvated, discolored toenails x10  Assessment: Symptomatic onychomycoses x10  Plan: Debrided toenails x10 without a bleeding  Reappoint at three-month

## 2013-09-10 ENCOUNTER — Other Ambulatory Visit: Payer: Self-pay | Admitting: *Deleted

## 2013-09-10 MED ORDER — INSULIN GLARGINE 100 UNIT/ML SOLOSTAR PEN: 36.0000 [IU] | PEN_INJECTOR | Freq: Every day | SUBCUTANEOUS | Status: DC

## 2013-09-26 ENCOUNTER — Telehealth: Payer: Self-pay | Admitting: *Deleted

## 2013-09-26 NOTE — Telephone Encounter (Signed)
Diabetic bundle, left message on patients vm to schedule appointment

## 2013-10-11 ENCOUNTER — Other Ambulatory Visit (INDEPENDENT_AMBULATORY_CARE_PROVIDER_SITE_OTHER): Payer: 59

## 2013-10-11 DIAGNOSIS — E1165 Type 2 diabetes mellitus with hyperglycemia: Principal | ICD-10-CM

## 2013-10-11 DIAGNOSIS — E785 Hyperlipidemia, unspecified: Secondary | ICD-10-CM

## 2013-10-11 DIAGNOSIS — IMO0001 Reserved for inherently not codable concepts without codable children: Secondary | ICD-10-CM

## 2013-10-11 LAB — LDL CHOLESTEROL, DIRECT: Direct LDL: 189.5 mg/dL

## 2013-10-11 LAB — HEMOGLOBIN A1C: HEMOGLOBIN A1C: 7 % — AB (ref 4.6–6.5)

## 2013-10-11 LAB — GLUCOSE, RANDOM: Glucose, Bld: 71 mg/dL (ref 70–99)

## 2013-10-12 ENCOUNTER — Encounter: Payer: Self-pay | Admitting: Interventional Cardiology

## 2013-10-12 ENCOUNTER — Ambulatory Visit (INDEPENDENT_AMBULATORY_CARE_PROVIDER_SITE_OTHER): Payer: 59 | Admitting: Interventional Cardiology

## 2013-10-12 VITALS — BP 120/70 | HR 47 | Ht 62.5 in | Wt 221.0 lb

## 2013-10-12 DIAGNOSIS — R5381 Other malaise: Secondary | ICD-10-CM

## 2013-10-12 DIAGNOSIS — I498 Other specified cardiac arrhythmias: Secondary | ICD-10-CM

## 2013-10-12 DIAGNOSIS — R5383 Other fatigue: Secondary | ICD-10-CM | POA: Insufficient documentation

## 2013-10-12 DIAGNOSIS — I2510 Atherosclerotic heart disease of native coronary artery without angina pectoris: Secondary | ICD-10-CM

## 2013-10-12 DIAGNOSIS — E785 Hyperlipidemia, unspecified: Secondary | ICD-10-CM

## 2013-10-12 DIAGNOSIS — R001 Bradycardia, unspecified: Secondary | ICD-10-CM

## 2013-10-12 DIAGNOSIS — E669 Obesity, unspecified: Secondary | ICD-10-CM

## 2013-10-12 MED ORDER — CLONIDINE HCL 0.2 MG PO TABS: 0.1000 mg | ORAL_TABLET | Freq: Two times a day (BID) | ORAL | Status: DC

## 2013-10-12 MED ORDER — METOPROLOL SUCCINATE ER 100 MG PO TB24: 100.0000 mg | ORAL_TABLET | Freq: Every day | ORAL | Status: DC

## 2013-10-12 NOTE — Patient Instructions (Signed)
Your physician has recommended you make the following change in your medication:   1. Decrease Clonidine to 0.2 mg to 1/2 tablet twice a day.  Your physician recommends that you follow up in 3 weeks on 11/02/13 at 8:15 am.

## 2013-10-12 NOTE — Progress Notes (Signed)
Patient ID: Heidi Campbell, female   DOB: 11-07-1945, 68 y.o.   MRN: [id number] Patient ID: Heidi Campbell, female   DOB: Feb 06, 1945, 68 y.o.   MRN: [id number]    [address] [place], [place]  [address] Phone: [phone] Fax:  [phone]  Date:  10/12/2013   ID:  Heidi Campbell, DOB 03-14-1945, MRN [id number]  PCP:  Astrid Divine, MD      History of Present Illness: Heidi Campbell is a 68 y.o. female who has had medically managed CAD, from cath Jun 22, 2010. Her husband passed away from prostate CA in 2012/06/21.  SHe has not been checking her BP at home.  Since the last visit, she had knee surgery and hand surgery.  SHe has lost weight.  Blood sugar and DOE are improving.  CAD/ASCVD:  Denies : Chest pain.  Diet.  Dyspnea on exertion.  Fatigue.  Leg edema.  She used to walk 20 minutes every other day.  Now doing chair exercises and stationary bike 52min/day.  She feels better doing that.    Wt Readings from Last 3 Encounters:  10/12/13 221 lb (100.245 kg)  07/30/13 230 lb (104.327 kg)  07/20/13 232 lb 3.2 oz (105.325 kg)     Past Medical History  Diagnosis Date  . Hypertension   . Diabetes mellitus without complication   . ALLERGIC RHINITIS   . ASTHMA   . Esophageal reflux   . SINUSITIS, ACUTE     Current Outpatient Prescriptions  Medication Sig Dispense Refill  . albuterol (PROVENTIL HFA;VENTOLIN HFA) 108 (90 BASE) MCG/ACT inhaler Inhale 2 puffs into the lungs every 6 (six) hours as needed for wheezing or shortness of breath.      Marland Kitchen amLODipine (NORVASC) 10 MG tablet Take 10 mg by mouth daily.      Marland Kitchen aspirin 81 MG tablet Take 81 mg by mouth daily.      . cloNIDine (CATAPRES) 0.2 MG tablet Take 0.2 mg by mouth 2 (two) times daily.       Marland Kitchen ezetimibe (ZETIA) 10 MG tablet Take 1 tablet (10 mg total) by mouth daily.  30 tablet  3  . fluticasone (FLONASE) 50 MCG/ACT nasal spray Place 1 spray into both nostrils daily.      . Fluticasone-Salmeterol (ADVAIR) 100-50  MCG/DOSE AEPB Inhale 1 puff into the lungs as needed.      Marland Kitchen glucose blood (ONETOUCH VERIO) test strip Use as instructed to check blood sugars 2 times per day, dx code 250.00  100 each  5  . insulin aspart (NOVOLOG FLEXPEN) 100 UNIT/ML FlexPen Inject 15 Units into the skin 2 (two) times daily.      . Insulin Glargine (LANTUS) 100 UNIT/ML Solostar Pen Inject 30 Units into the skin daily at 10 pm.      . irbesartan-hydrochlorothiazide (AVALIDE) 300-12.5 MG per tablet Take 1 tablet by mouth daily.      Marland Kitchen levothyroxine (SYNTHROID, LEVOTHROID) 125 MCG tablet Take 125 mcg by mouth daily before breakfast.      . Liraglutide (VICTOZA) 18 MG/3ML SOPN Inject 0.2 mLs into the skin daily. Inject once daily at the same time      . meloxicam (MOBIC) 15 MG tablet Take 15 mg by mouth as needed for pain.      . metFORMIN (GLUCOPHAGE) 1000 MG tablet Take 1,000 mg by mouth 2 (two) times daily with a meal.      . metoprolol succinate (TOPROL-XL) 100 MG 24 hr  tablet Take 100 mg by mouth daily. Take with or immediately following a meal.      . omeprazole (PRILOSEC) 20 MG capsule Take 20 mg by mouth daily.      . polyethylene glycol (MIRALAX / GLYCOLAX) packet Take 17 g by mouth daily.      . rosuvastatin (CRESTOR) 40 MG tablet Take 1 tablet (40 mg total) by mouth daily.  30 tablet  6   No current facility-administered medications for this visit.    Allergies:    Allergies  Allergen Reactions  . Ace Inhibitors Cough  . Shellfish Allergy     Pt cannot recall at this time    Social History:  The patient  reports that she has never smoked. She does not have any smokeless tobacco history on file. She reports that she does not drink alcohol or use illicit drugs.   Family History:  The patient's family history includes Diabetes in her mother; Heart attack in her father; Heart disease in her father.   ROS:  Please see the history of present illness.  No nausea, vomiting.  No fevers, chills.  No focal weakness.  No  dysuria.    All other systems reviewed and negative.   PHYSICAL EXAM: VS:  BP 120/70  Pulse 47  Ht 5' 2.5" (1.588 m)  Wt 221 lb (100.245 kg)  BMI 39.75 kg/m2 Well nourished, well developed, in no acute distress HEENT: normal Neck: no JVD, no carotid bruits Cardiac:  normal S1, S2; RRR;  Lungs:  clear to auscultation bilaterally, no wheezing, rhonchi or rales Abd: soft, nontender, no hepatomegaly Ext: no edema Skin: warm and dry Neuro:   no focal abnormalities noted      ASSESSMENT AND PLAN:  Essential hypertension, benign  Continue Amlodipine Besylate Tablet, 10 MG, 1 tablet, Orally, Once a day, 90 days, 90, Refills 3 Continue Irbesartan-Hydrochlorothiazide Tablet, 300-12.5 MG, 1 tablet, Orally, Once a day, 90 day(s), 90, Refills 3 Refill Toprol XL Tablet Extended Release 24 Hour, 100 MG, 1 tablet, Orally, Once a day, 90 days, 90, Refills 3 Notes: Well controlled today. Improving with weight loss. Check BP at home. Could increase frequency of clonidine. She has headaches with the high blood pressure in the past; after her husband died. If her blood pressure spikes above 160 systolic, she should take an extra clonidine tablet. Decrease clonidine to 0.1 mg BID to decrease fatigue, dry mouth and increase HR.   Check BP 3-4 x/week until next appt.  If BP stays controlled, would try to stop clonidine to help with the above side effects. Bradycardia:  Her heart rate is still low, would look at trying to decrease Toprol as well. This may also help with fatigue. Blood pressure has improved with weight loss as well as decrease stress. Last visit, her husband just passed away.    2. CAD in native artery        Notes: Moderate by prior cath. No angina. Tolerated her surgeries well.   3. Morbid obesity with BMI of 40.0-44.9, adult  Notes: Spoke about the importance of weight loss. She will try to increase activity. She can improve her diet as well. Weight loss has helped BP.  She has lost 40  lbs in the past year through diet control.  Hopefully exercise can increase as her knee improves.  4. Diabetes: managed by PCP.  Preventive Medicine  Adult topics discussed:  Diet: healthy diet, Lots of fruits and vegetables.  Exercise: 5 days a week,  at least 30 minutes of aerobic exercise.      Signed, Fredric Mare, MD, [facility] 10/12/2013 8:49 AM

## 2013-10-16 ENCOUNTER — Ambulatory Visit: Payer: 59 | Admitting: Endocrinology

## 2013-10-19 ENCOUNTER — Ambulatory Visit (INDEPENDENT_AMBULATORY_CARE_PROVIDER_SITE_OTHER): Payer: 59 | Admitting: Endocrinology

## 2013-10-19 ENCOUNTER — Encounter: Payer: Self-pay | Admitting: Endocrinology

## 2013-10-19 VITALS — BP 134/68 | HR 64 | Temp 98.3°F | Resp 16 | Ht 62.5 in | Wt 221.8 lb

## 2013-10-19 DIAGNOSIS — E1165 Type 2 diabetes mellitus with hyperglycemia: Principal | ICD-10-CM

## 2013-10-19 DIAGNOSIS — Z23 Encounter for immunization: Secondary | ICD-10-CM

## 2013-10-19 DIAGNOSIS — IMO0001 Reserved for inherently not codable concepts without codable children: Secondary | ICD-10-CM

## 2013-10-19 DIAGNOSIS — E785 Hyperlipidemia, unspecified: Secondary | ICD-10-CM

## 2013-10-19 DIAGNOSIS — I10 Essential (primary) hypertension: Secondary | ICD-10-CM

## 2013-10-19 NOTE — Patient Instructions (Addendum)
LANTUS  34 in ams  Novolog (orange) 8 at breakfast and 18 at supper (adjust between 16-20)  Check more supper after supper   Take cholesterol medicines daily

## 2013-10-19 NOTE — Progress Notes (Signed)
Patient ID: Heidi Campbell, female   DOB: July 20, 1945, 68 y.o.   MRN: [id number]   Reason for Appointment: Diabetes follow-up   History of Present Illness   Diagnosis: Type 2 DIABETES MELITUS, date of diagnosis: 1998      Previous history: She was initially treated with oral hypoglycemic drugs but switched to insulin when she was hospitalized for asthma in 2007. She has been on metformin but only 500 mg twice a day Over most of her diabetes history has been taking premixed insulin, NovoLog mix 70/30, twice a day Although her A1c in the past has been close to 7% she would tend to have hyperglycemia at 11 AM or overnight with his insulin Her  A1c in 2014 was 6.8 in 4/14  Recent history:  She had been on a regimen of basal bolus insulin and metformin She was given Victoza in 6/15 because of persistent poor control with her insulin regimen She had titrated dose to 1.2 mg without side effects Her blood sugars are significantly better Also she has lost 9 pounds since she started this Subjectively she thinks she feels better overall and is back in exercise regimen consistently at home She has not adjusted her insulin as discussed on her last visit and is taking the same doses She does periodically feel her sugar dropping during the night and feeling weak but has not had readings below 60 and none recently She is checking her blood sugars mostly before breakfast and only sometimes nonfasting She has lost some weight since her last visit also     Oral hypoglycemic drugs: Metformin ER1500 mg         Side effects from medications:  occasional diarrhea from metformin Insulin regimen:  36 Lantus at breakfast, Novolog insulin 8 units breakfast and 20 a.c. supper    Proper timing of medications in relation to meals: Yes, 30 min ac.          Monitors blood glucose: Once a day.    Glucometer: [employer].          Blood Glucose readings   PREMEAL Breakfast Lunch Dinner Bedtime Overall  Glucose  range:  77-195    107   100-173    Mean/median:  115      113          Meals:  usually 2 meals per day, not eating lunch. Supper 7 pm; she thinks her portions are usually small         Physical activity: exercise: Ex bike bid           Dietician visit: Most recent: 1998 ?     Weight control:  Wt Readings from Last 3 Encounters:  10/19/13 221 lb 12.8 oz (100.608 kg)  10/12/13 221 lb (100.245 kg)  07/30/13 230 lb (104.327 kg)        Diabetes labs:  Lab Results  Component Value Date   HGBA1C 7.0* 10/11/2013   HGBA1C 8.7* 05/11/2013   HGBA1C 9.5* 01/17/2013   Lab Results  Component Value Date   MICROALBUR 6.9* 07/03/2013   LDLCALC 187* 07/03/2013   CREATININE 1.2 07/03/2013     Complications: None   Last eye exam: 02/2012     Medication List       This list is accurate as of: 10/19/13  9:13 AM.  Always use your most recent med list.               albuterol 108 (90 BASE) MCG/ACT inhaler  Commonly known as:  PROVENTIL HFA;VENTOLIN HFA  Inhale 2 puffs into the lungs every 6 (six) hours as needed for wheezing or shortness of breath.     amLODipine 10 MG tablet  Commonly known as:  NORVASC  Take 10 mg by mouth daily.     aspirin 81 MG tablet  Take 81 mg by mouth daily.     cloNIDine 0.2 MG tablet  Commonly known as:  CATAPRES  Take 0.5 tablets (0.1 mg total) by mouth 2 (two) times daily.     ezetimibe 10 MG tablet  Commonly known as:  ZETIA  Take 1 tablet (10 mg total) by mouth daily.     fluticasone 50 MCG/ACT nasal spray  Commonly known as:  FLONASE  Place 1 spray into both nostrils daily.     Fluticasone-Salmeterol 100-50 MCG/DOSE Aepb  Commonly known as:  ADVAIR  Inhale 1 puff into the lungs as needed.     glucose blood test strip  Commonly known as:  ONETOUCH VERIO  Use as instructed to check blood sugars 2 times per day, dx code 250.00     Insulin Glargine 100 UNIT/ML Solostar Pen  Commonly known as:  LANTUS  Inject 30 Units into the skin daily at 10 pm.      irbesartan-hydrochlorothiazide 300-12.5 MG per tablet  Commonly known as:  AVALIDE  Take 1 tablet by mouth daily.     levothyroxine 125 MCG tablet  Commonly known as:  SYNTHROID, LEVOTHROID  Take 125 mcg by mouth daily before breakfast.     meloxicam 15 MG tablet  Commonly known as:  MOBIC  Take 15 mg by mouth as needed for pain.     metFORMIN 1000 MG tablet  Commonly known as:  GLUCOPHAGE  Take 1,000 mg by mouth 2 (two) times daily with a meal.     metoprolol succinate 100 MG 24 hr tablet  Commonly known as:  TOPROL-XL  Take 1 tablet (100 mg total) by mouth daily. Take with or immediately following a meal.     NOVOLOG FLEXPEN 100 UNIT/ML FlexPen  Generic drug:  insulin aspart  Inject 15 Units into the skin 2 (two) times daily.     omeprazole 20 MG capsule  Commonly known as:  PRILOSEC  Take 20 mg by mouth daily.     polyethylene glycol packet  Commonly known as:  MIRALAX / GLYCOLAX  Take 17 g by mouth daily.     rosuvastatin 40 MG tablet  Commonly known as:  CRESTOR  Take 1 tablet (40 mg total) by mouth daily.     VICTOZA 18 MG/3ML Sopn  Generic drug:  Liraglutide  Inject 0.2 mLs into the skin daily. Inject once daily at the same time        Allergies:  Allergies  Allergen Reactions  . Ace Inhibitors Cough  . Shellfish Allergy     Pt cannot recall at this time    Past Medical History  Diagnosis Date  . Hypertension   . Diabetes mellitus without complication   . ALLERGIC RHINITIS   . ASTHMA   . Esophageal reflux   . SINUSITIS, ACUTE     Past Surgical History  Procedure Laterality Date  . Abdominal hysterectomy    . Breast surgery      Family History  Problem Relation Age of Onset  . Diabetes Mother   . Heart attack Father   . Heart disease Father     Social History:  reports that she has  never smoked. She does not have any smokeless tobacco history on file. She reports that she does not drink alcohol or use illicit drugs.  Review of  Systems:  Hypertension:  has good control with amlodipine, clonidine and Avalide, followed by cardiologist and PCP  Renal function: Creatinine normal  Lipids: Have not been controlled well . Does have a history of CAD also.  She again claims that she is taking her Crestor and Zetia every day even though previously she admitted to be irregular with this; does not think she has any difficulties with out-of-pocket expense and insurance coverage Her LDL is higher than before  Lab Results  Component Value Date   CHOL 263* 07/03/2013   HDL 48.10 07/03/2013   LDLCALC 187* 07/03/2013   LDLDIRECT 189.5 10/11/2013   TRIG 142.0 07/03/2013   CHOLHDL 5 07/03/2013    Neuropathy: She previously had a burning pain in her feet treated with gabapentin, no symptoms recently     LABS:  No visits with results within 1 Week(s) from this visit. Latest known visit with results is:  Appointment on 10/11/2013  Component Date Value Ref Range Status  . Hemoglobin A1C 10/11/2013 7.0* 4.6 - 6.5 % Final   Glycemic Control Guidelines for People with Diabetes:Non Diabetic:  <6%Goal of Therapy: <7%Additional Action Suggested:  >8%   . Glucose, Bld 10/11/2013 71  70 - 99 mg/dL Final  . Direct LDL 47/82/9562 189.5   Final   Optimal:  <100 mg/dLNear or Above Optimal:  100-129 mg/dLBorderline High:  130-159 mg/dLHigh:  160-189 mg/dLVery High:  >190 mg/dL     Examination:   BP 130/86  Pulse 64  Temp(Src) 98.3 F (36.8 C)  Resp 16  Ht 5' 2.5" (1.588 m)  Wt 221 lb 12.8 oz (100.608 kg)  BMI 39.90 kg/m2  SpO2 98%  Body mass index is 39.9 kg/(m^2).    ASSESSMENT/ PLAN:    Diabetes type 2:  Blood glucose control is significantly better with adding Victoza to her basal bolus insulin regimen and metformin Her weight has come down also Also with her being able to exercise she has overall felt better Although her blood sugars are not getting low recently she has had  occasional low sugars overnight  Most likely will  need some reduction insulin dose Have discussed adjustment of her suppertime dose based on her meal sizes and carbohydrate intake which she is not doing A1c is at her target of 7%  Recommendations: Day-to-day management was discussed with patient   Continue Victoza 1.2 mg  Reduce NovoLog by at least 2 units and adjust based on meal size and carbohydrates  Reduce Lantus by 2 units and further if fasting blood sugars are low normal or having nocturnal hypoglycemia  More consistent monitoring after meals instead of just in the morning  Regular exercise as before  Continue improving diet  Lipids: She claims she is compliant with her medications but LDL is higher with Crestor and Zetia, WelChol was stopped She will be referred to the lipid clinic for further management and consideration of PCKS drug  Hypertension: Blood pressure is better Does not have microalbuminuria  Counseling time over 50% of today's 25 minute visit  Aarin Bluett 10/19/2013, 9:13 AM

## 2013-10-26 ENCOUNTER — Telehealth: Payer: Self-pay | Admitting: Endocrinology

## 2013-10-26 MED ORDER — INSULIN ASPART 100 UNIT/ML FLEXPEN: 15.0000 [IU] | PEN_INJECTOR | Freq: Two times a day (BID) | SUBCUTANEOUS | Status: DC

## 2013-10-26 NOTE — Telephone Encounter (Signed)
PATIENT WOULD LIKE HER NOVOLOG 70/30 REFILLED    [facility] [address]   THANK YOU

## 2013-10-26 NOTE — Telephone Encounter (Signed)
Rx sent to pharmacy   

## 2013-11-02 ENCOUNTER — Ambulatory Visit: Payer: 59 | Admitting: Interventional Cardiology

## 2013-11-05 ENCOUNTER — Ambulatory Visit (INDEPENDENT_AMBULATORY_CARE_PROVIDER_SITE_OTHER): Payer: Medicare Other | Admitting: Podiatry

## 2013-11-05 DIAGNOSIS — M79676 Pain in unspecified toe(s): Secondary | ICD-10-CM

## 2013-11-05 DIAGNOSIS — B351 Tinea unguium: Secondary | ICD-10-CM

## 2013-11-05 NOTE — Progress Notes (Signed)
Patient ID: Heidi Campbell, female   DOB: [birth year], 68 y.o.   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

## 2013-11-09 ENCOUNTER — Ambulatory Visit: Payer: 59 | Admitting: Pharmacist

## 2013-11-19 ENCOUNTER — Ambulatory Visit: Payer: 59 | Admitting: Interventional Cardiology

## 2013-11-21 ENCOUNTER — Other Ambulatory Visit: Payer: Self-pay | Admitting: *Deleted

## 2013-11-21 MED ORDER — INSULIN GLARGINE 100 UNIT/ML SOLOSTAR PEN: 34.0000 [IU] | PEN_INJECTOR | Freq: Every day | SUBCUTANEOUS | Status: DC

## 2013-11-22 ENCOUNTER — Ambulatory Visit: Payer: 59 | Admitting: Pharmacist

## 2013-12-10 ENCOUNTER — Ambulatory Visit (INDEPENDENT_AMBULATORY_CARE_PROVIDER_SITE_OTHER): Payer: 59 | Admitting: Interventional Cardiology

## 2013-12-10 ENCOUNTER — Encounter: Payer: Self-pay | Admitting: Interventional Cardiology

## 2013-12-10 VITALS — BP 156/78 | HR 64 | Ht 62.5 in | Wt 222.8 lb

## 2013-12-10 DIAGNOSIS — I10 Essential (primary) hypertension: Secondary | ICD-10-CM

## 2013-12-10 DIAGNOSIS — R001 Bradycardia, unspecified: Secondary | ICD-10-CM

## 2013-12-10 NOTE — Progress Notes (Signed)
Patient ID: Heidi Campbell, female   DOB: Jan 01, 1946, 68 y.o.   MRN: [id number]    [address] [place]  [address] Phone: [phone] Fax:  [phone]  Date:  12/10/2013   ID:  Heidi Aloevon V Ranieri, DOB Jan 01, 1946, MRN [id number]  PCP:  Astrid DivineGRIFFIN,ELAINE COLLINS, MD      History of Present Illness: Heidi Aloevon V Goeller is a 68 y.o. female who has had medically managed CAD, from cath 2012. Her husband passed away from prostate CA in 2014.  SHe has not been checking her BP at home.  Since the last visit, she had knee surgery and hand surgery.  SHe has lost weight.  Blood sugar and DOE are improving.  CAD/ASCVD:  Denies : Chest pain.  Diet.  Dyspnea on exertion.  Fatigue.  Leg edema.  She used to walk 20 minutes every other day.  Still doing chair exercises and stationary bike 5930min/day.  She feels better doing that.  Over the past few weeks, BP has been controled,  She has only had to use prn clonidine in the past 3 days.   Wt Readings from Last 3 Encounters:  12/10/13 222 lb 12.8 oz (101.061 kg)  10/19/13 221 lb 12.8 oz (100.608 kg)  10/12/13 221 lb (100.245 kg)     Past Medical History  Diagnosis Date  . Hypertension   . Diabetes mellitus without complication   . ALLERGIC RHINITIS   . ASTHMA   . Esophageal reflux   . SINUSITIS, ACUTE     Current Outpatient Prescriptions  Medication Sig Dispense Refill  . albuterol (PROVENTIL HFA;VENTOLIN HFA) 108 (90 BASE) MCG/ACT inhaler Inhale 2 puffs into the lungs every 6 (six) hours as needed for wheezing or shortness of breath.    Marland Kitchen. amLODipine (NORVASC) 10 MG tablet Take 10 mg by mouth daily.    Marland Kitchen. aspirin 81 MG tablet Take 81 mg by mouth daily.    . cloNIDine (CATAPRES) 0.2 MG tablet Take 0.5 tablets (0.1 mg total) by mouth 2 (two) times daily.    Marland Kitchen. ezetimibe (ZETIA) 10 MG tablet Take 1 tablet (10 mg total) by mouth daily. 30 tablet 3  . fluticasone (FLONASE) 50 MCG/ACT nasal spray Place 1 spray into both nostrils daily.     . Fluticasone-Salmeterol (ADVAIR) 100-50 MCG/DOSE AEPB Inhale 1 puff into the lungs as needed.    Marland Kitchen. glucose blood (ONETOUCH VERIO) test strip Use as instructed to check blood sugars 2 times per day, dx code 250.00 100 each 5  . insulin aspart (NOVOLOG FLEXPEN) 100 UNIT/ML FlexPen Inject 15 Units into the skin 2 (two) times daily. 15 mL 2  . Insulin Glargine (LANTUS) 100 UNIT/ML Solostar Pen Inject 34 Units into the skin daily at 10 pm. 15 mL 2  . irbesartan-hydrochlorothiazide (AVALIDE) 300-12.5 MG per tablet Take 1 tablet by mouth daily.    Marland Kitchen. levothyroxine (SYNTHROID, LEVOTHROID) 125 MCG tablet Take 125 mcg by mouth daily before breakfast.    . Liraglutide (VICTOZA) 18 MG/3ML SOPN Inject 0.2 mLs into the skin daily. Inject once daily at the same time    . meloxicam (MOBIC) 15 MG tablet Take 15 mg by mouth as needed for pain.    . metFORMIN (GLUCOPHAGE) 1000 MG tablet Take 1,000 mg by mouth 2 (two) times daily with a meal.    . metoprolol succinate (TOPROL-XL) 100 MG 24 hr tablet Take 1 tablet (100 mg total) by mouth daily. Take with or immediately following a meal.  90 tablet 3  . omeprazole (PRILOSEC) 20 MG capsule Take 20 mg by mouth daily.    . polyethylene glycol (MIRALAX / GLYCOLAX) packet Take 17 g by mouth daily.    . rosuvastatin (CRESTOR) 40 MG tablet Take 1 tablet (40 mg total) by mouth daily. 30 tablet 6   No current facility-administered medications for this visit.    Allergies:    Allergies  Allergen Reactions  . Ace Inhibitors Cough  . Shellfish Allergy     Pt cannot recall at this time    Social History:  The patient  reports that she has never smoked. She does not have any smokeless tobacco history on file. She reports that she does not drink alcohol or use illicit drugs.   Family History:  The patient's family history includes Diabetes in her mother; Heart attack in her father; Heart disease in her father.   ROS:  Please see the history of present illness.  No nausea,  vomiting.  No fevers, chills.  No focal weakness.  No dysuria.    All other systems reviewed and negative.   PHYSICAL EXAM: VS:  BP 156/78 mmHg  Pulse 64  Ht 5' 2.5" (1.588 m)  Wt 222 lb 12.8 oz (101.061 kg)  BMI 40.08 kg/m2 Well nourished, well developed, in no acute distress HEENT: normal Neck: no JVD, no carotid bruits Cardiac:  normal S1, S2; RRR;  Lungs:  clear to auscultation bilaterally, no wheezing, rhonchi or rales Abd: soft, nontender, no hepatomegaly Ext: no edema Skin: warm and dry Neuro:   no focal abnormalities noted      ASSESSMENT AND PLAN:  Essential hypertension, benign  Continue Amlodipine Besylate Tablet, 10 MG, 1 tablet, Orally, Once a day, 90 days, 90, Refills 3 Continue Irbesartan-Hydrochlorothiazide Tablet, 300-12.5 MG, 1 tablet, Orally, Once a day, 90 day(s), 90, Refills 3 Refill Toprol XL Tablet Extended Release 24 Hour, 100 MG, 1 tablet, Orally, Once a day, 90 days, 90, Refills 3 Notes: Well controlled today. Improving with weight loss. Check BP at home. Could increase frequency of clonidine. She has headaches with the high blood pressure in the past; after her husband died. If her blood pressure spikes above 160 systolic, she should take an extra clonidine tablet. Decrease clonidine to 0.1 mg BID to decrease fatigue, dry mouth and increase HR.   Check BP 3-4 x/week until next appt.  If BP stays controlled, would try to stop clonidine to help with the above side effects.  BP higher today, perhaps because of pain.  Will not decrease anything at this point.   154/63 on my recheck.   Bradycardia:  Her heart rate is still low, would look at trying to decrease Toprol as well. This may also help with fatigue. Blood pressure had improved with weight loss as well as decrease stress.     2. CAD in native artery :       Notes: Moderate by prior cath. No angina. Tolerated her surgeries well.   3. Morbid obesity with BMI of 40.0-44.9, adult  Notes: Spoke about the  importance of weight loss. She will try to increase activity and try different exercises.. She can improve her diet as well. Weight loss has helped BP.  She has lost 40 lbs in the past year through diet control.  Hopefully exercise can increase as her knee improves.  4. Diabetes: managed by PCP.  Preventive Medicine  Adult topics discussed:  Diet: healthy diet, Lots of fruits and vegetables.  Exercise:  5 days a week, at least 30 minutes of aerobic exercise.      Signed, Fredric MareJay S. Aanchal Cope, MD, [facility] 12/10/2013 1:37 PM

## 2013-12-10 NOTE — Patient Instructions (Signed)
Your physician wants you to follow-up in: 6 months with Dr Varanasi You will receive a reminder letter in the mail two months in advance. If you don't receive a letter, please call our office to schedule the follow-up appointment.  

## 2013-12-13 ENCOUNTER — Ambulatory Visit: Payer: 59 | Admitting: Pharmacist

## 2014-01-15 ENCOUNTER — Other Ambulatory Visit: Payer: 59

## 2014-01-18 ENCOUNTER — Ambulatory Visit: Payer: 59 | Admitting: Endocrinology

## 2014-02-04 ENCOUNTER — Other Ambulatory Visit: Payer: 59

## 2014-02-04 ENCOUNTER — Other Ambulatory Visit (INDEPENDENT_AMBULATORY_CARE_PROVIDER_SITE_OTHER): Payer: Self-pay

## 2014-02-04 DIAGNOSIS — IMO0002 Reserved for concepts with insufficient information to code with codable children: Secondary | ICD-10-CM

## 2014-02-04 DIAGNOSIS — E1165 Type 2 diabetes mellitus with hyperglycemia: Secondary | ICD-10-CM

## 2014-02-04 LAB — BASIC METABOLIC PANEL
BUN: 16 mg/dL (ref 6–23)
CHLORIDE: 103 meq/L (ref 96–112)
CO2: 28 meq/L (ref 19–32)
Calcium: 9.7 mg/dL (ref 8.4–10.5)
Creatinine, Ser: 1.4 mg/dL — ABNORMAL HIGH (ref 0.4–1.2)
GFR: 46.44 mL/min — ABNORMAL LOW (ref >60.00)
Glucose, Bld: 97 mg/dL (ref 70–99)
POTASSIUM: 3.9 meq/L (ref 3.5–5.1)
SODIUM: 140 meq/L (ref 135–145)

## 2014-02-04 LAB — HEMOGLOBIN A1C: Hgb A1c MFr Bld: 7.6 % — ABNORMAL HIGH (ref 4.6–6.5)

## 2014-02-07 ENCOUNTER — Ambulatory Visit: Payer: 59 | Admitting: Endocrinology

## 2014-02-11 ENCOUNTER — Ambulatory Visit (INDEPENDENT_AMBULATORY_CARE_PROVIDER_SITE_OTHER): Payer: 59 | Admitting: Podiatry

## 2014-02-11 ENCOUNTER — Encounter: Payer: Self-pay | Admitting: Podiatry

## 2014-02-11 DIAGNOSIS — M79676 Pain in unspecified toe(s): Secondary | ICD-10-CM

## 2014-02-11 DIAGNOSIS — B351 Tinea unguium: Secondary | ICD-10-CM

## 2014-02-11 NOTE — Progress Notes (Signed)
Patient ID: Heidi Campbell, female   DOB: Aug 25, 1945, 69 y.o.   MRN: [id number]  Subjective: This patient presents again complaining of painful toenails and walking wearing shoes  Objective: The toenails are incurvated, elongated, discolored, hypertrophic and tender to palpation 6-10  Assessment: Symptomatic onychomycoses 6-10 Type II diabetic  Plan: Debridement toenails 10 without a bleeding Continue to wear gelcap sleeve on second left toe  Reappoint 3 months

## 2014-02-11 NOTE — Patient Instructions (Signed)
Diabetes and Foot Care Diabetes may cause you to have problems because of poor blood supply (circulation) to your feet and legs. This may cause the skin on your feet to become thinner, break easier, and heal more slowly. Your skin may become dry, and the skin may peel and crack. You may also have nerve damage in your legs and feet causing decreased feeling in them. You may not notice minor injuries to your feet that could lead to infections or more serious problems. Taking care of your feet is one of the most important things you can do for yourself.  HOME CARE INSTRUCTIONS  Wear shoes at all times, even in the house. Do not go barefoot. Bare feet are easily injured.  Check your feet daily for blisters, cuts, and redness. If you cannot see the bottom of your feet, use a mirror or ask someone for help.  Wash your feet with warm water (do not use hot water) and mild soap. Then pat your feet and the areas between your toes until they are completely dry. Do not soak your feet as this can dry your skin.  Apply a moisturizing lotion or petroleum jelly (that does not contain alcohol and is unscented) to the skin on your feet and to dry, brittle toenails. Do not apply lotion between your toes.  Trim your toenails straight across. Do not dig under them or around the cuticle. File the edges of your nails with an emery board or nail file.  Do not cut corns or calluses or try to remove them with medicine.  Wear clean socks or stockings every day. Make sure they are not too tight. Do not wear knee-high stockings since they may decrease blood flow to your legs.  Wear shoes that fit properly and have enough cushioning. To break in new shoes, wear them for just a few hours a day. This prevents you from injuring your feet. Always look in your shoes before you put them on to be sure there are no objects inside.  Do not cross your legs. This may decrease the blood flow to your feet.  If you find a minor scrape,  cut, or break in the skin on your feet, keep it and the skin around it clean and dry. These areas may be cleansed with mild soap and water. Do not cleanse the area with peroxide, alcohol, or iodine.  When you remove an adhesive bandage, be sure not to damage the skin around it.  If you have a wound, look at it several times a day to make sure it is healing.  Do not use heating pads or hot water bottles. They may burn your skin. If you have lost feeling in your feet or legs, you may not know it is happening until it is too late.  Make sure your health care provider performs a complete foot exam at least annually or more often if you have foot problems. Report any cuts, sores, or bruises to your health care provider immediately. SEEK MEDICAL CARE IF:   You have an injury that is not healing.  You have cuts or breaks in the skin.  You have an ingrown nail.  You notice redness on your legs or feet.  You feel burning or tingling in your legs or feet.  You have pain or cramps in your legs and feet.  Your legs or feet are numb.  Your feet always feel cold. SEEK IMMEDIATE MEDICAL CARE IF:   There is increasing redness,   swelling, or pain in or around a wound.  There is a red line that goes up your leg.  Pus is coming from a wound.  You develop a fever or as directed by your health care provider.  You notice a bad smell coming from an ulcer or wound. Document Released: 01/16/2000 Document Revised: 09/20/2012 Document Reviewed: 06/27/2012 Exit[employer] Patient Information 2015 [employer]. This information is not intended to replace advice given to you by your health care provider. Make sure you discuss any questions you have with your health care provider.  

## 2014-03-15 ENCOUNTER — Ambulatory Visit (INDEPENDENT_AMBULATORY_CARE_PROVIDER_SITE_OTHER): Payer: 59 | Admitting: Endocrinology

## 2014-03-15 ENCOUNTER — Encounter: Payer: Self-pay | Admitting: Endocrinology

## 2014-03-15 VITALS — BP 180/76 | HR 68 | Temp 98.5°F | Resp 14 | Ht 62.5 in | Wt 223.4 lb

## 2014-03-15 DIAGNOSIS — E1165 Type 2 diabetes mellitus with hyperglycemia: Secondary | ICD-10-CM

## 2014-03-15 DIAGNOSIS — IMO0002 Reserved for concepts with insufficient information to code with codable children: Secondary | ICD-10-CM

## 2014-03-15 DIAGNOSIS — E782 Mixed hyperlipidemia: Secondary | ICD-10-CM

## 2014-03-15 DIAGNOSIS — I10 Essential (primary) hypertension: Secondary | ICD-10-CM

## 2014-03-15 NOTE — Patient Instructions (Addendum)
Lantus 32 in ams  Novolog 18 at supper  Please check blood sugars at least half the time about 2 hours after any meal and 3 times per week on waking up.  Please bring blood sugar monitor to each visit. Recommended blood sugar levels about 2 hours after meal is 140-180 and on waking up 90-130

## 2014-03-15 NOTE — Progress Notes (Signed)
Patient ID: Heidi Campbell, female   DOB: 1945-10-01, 69 y.o.   MRN: [id number]   Reason for Appointment: Diabetes follow-up   History of Present Illness   Diagnosis: Type 2 DIABETES MELITUS, date of diagnosis: 1998      Previous history: She was initially treated with oral hypoglycemic drugs but switched to insulin when she was hospitalized for asthma in 2007. She has been on metformin but only 500 mg twice a day Over most of her diabetes history has been taking premixed insulin, NovoLog mix 70/30, twice a day Although her A1c in the past has been close to 7% she would tend to have hyperglycemia at 11 AM or overnight with his insulin Her  A1c in 2014 was 6.8 in 4/14  Recent history:  She had been on a regimen of basal bolus insulin, Victoza and metformin She was given Victoza in 6/15 because of persistent poor control with her insulin regimen Her blood sugars improved significantly with Victoza and her A1c had come down to 7% However her A1c has gone up to 7.6 now, has not been seen in follow-up since 10/2013 She does think she is compliant with her mealtime insulin but is taking NovoLog only with breakfast and supper She has not lost any further weight She has tried to do a little exercise but not as often as before. Current blood sugar patterns:  She is checking her blood sugars primarily in the morning and usually not after meals  Blood sugars are periodically low early morning and recently have been mostly under 100  She has only a couple readings in the evenings which are low normal  Blood sugars around lunchtime are variable  She has an odd reading of 345 last month in the morning and does not know why    Oral hypoglycemic drugs: Metformin ER1500 mg         Side effects from medications:  occasional diarrhea from metformin Insulin regimen:  36 Lantus at breakfast, Novolog insulin 8 units breakfast and 20 a.c. supper    Proper timing of medications in relation to  meals: Yes, 30 min ac.          Monitors blood glucose: Once a day.    Glucometer: [employer].          Blood Glucose readings   PRE-MEAL Breakfast Lunch Dinner  8 PM  Overall  Glucose range:  58-140   90, 167    60, 101    Mean 112    108   Meals:  usually 2 meals per day, not eating lunch. Supper at 7 pm     Physical activity: exercise: Exercise bike , 3/7 days a week           Dietician visit: Most recent: 1998 ?     Weight control:  Wt Readings from Last 3 Encounters:  03/15/14 223 lb 6.4 oz (101.334 kg)  12/10/13 222 lb 12.8 oz (101.061 kg)  10/19/13 221 lb 12.8 oz (100.608 kg)        Diabetes labs:  Lab Results  Component Value Date   HGBA1C 7.6* 02/04/2014   HGBA1C 7.0* 10/11/2013   HGBA1C 8.7* 05/11/2013   Lab Results  Component Value Date   MICROALBUR 6.9* 07/03/2013   LDLCALC 187* 07/03/2013   CREATININE 1.4* 02/04/2014       Last eye exam: 02/2014    No visits with results within 1 Week(s) from this visit. Latest known visit  with results is:  Appointment on 02/04/2014  Component Date Value Ref Range Status  . Hgb A1c MFr Bld 02/04/2014 7.6* 4.6 - 6.5 % Final   Glycemic Control Guidelines for People with Diabetes:Non Diabetic:  <6%Goal of Therapy: <7%Additional Action Suggested:  >8%   . Sodium 02/04/2014 140  135 - 145 mEq/L Final  . Potassium 02/04/2014 3.9  3.5 - 5.1 mEq/L Final  . Chloride 02/04/2014 103  96 - 112 mEq/L Final  . CO2 02/04/2014 28  19 - 32 mEq/L Final  . Glucose, Bld 02/04/2014 97  70 - 99 mg/dL Final  . BUN 09/81/1914 16  6 - 23 mg/dL Final  . Creatinine, Ser 02/04/2014 1.4* 0.4 - 1.2 mg/dL Final  . Calcium 78/29/5621 9.7  8.4 - 10.5 mg/dL Final  . GFR 30/86/5784 46.44* >60.00 mL/min Final       Medication List       This list is accurate as of: 03/15/14  9:07 AM.  Always use your most recent med list.               albuterol 108 (90 BASE) MCG/ACT inhaler  Commonly known as:  PROVENTIL HFA;VENTOLIN HFA  Inhale  2 puffs into the lungs every 6 (six) hours as needed for wheezing or shortness of breath.     amLODipine 10 MG tablet  Commonly known as:  NORVASC  Take 10 mg by mouth daily.     aspirin 81 MG tablet  Take 81 mg by mouth daily.     cloNIDine 0.2 MG tablet  Commonly known as:  CATAPRES  Take 0.5 tablets (0.1 mg total) by mouth 2 (two) times daily.     ezetimibe 10 MG tablet  Commonly known as:  ZETIA  Take 1 tablet (10 mg total) by mouth daily.     fluticasone 50 MCG/ACT nasal spray  Commonly known as:  FLONASE  Place 1 spray into both nostrils daily.     Fluticasone-Salmeterol 100-50 MCG/DOSE Aepb  Commonly known as:  ADVAIR  Inhale 1 puff into the lungs as needed.     glucose blood test strip  Commonly known as:  ONETOUCH VERIO  Use as instructed to check blood sugars 2 times per day, dx code 250.00     insulin aspart 100 UNIT/ML FlexPen  Commonly known as:  NOVOLOG FLEXPEN  Inject 15 Units into the skin 2 (two) times daily.     Insulin Glargine 100 UNIT/ML Solostar Pen  Commonly known as:  LANTUS  Inject 34 Units into the skin daily at 10 pm.     irbesartan-hydrochlorothiazide 300-12.5 MG per tablet  Commonly known as:  AVALIDE  Take 1 tablet by mouth daily.     levothyroxine 125 MCG tablet  Commonly known as:  SYNTHROID, LEVOTHROID  Take 125 mcg by mouth daily before breakfast.     meloxicam 15 MG tablet  Commonly known as:  MOBIC  Take 15 mg by mouth as needed for pain.     metFORMIN 1000 MG tablet  Commonly known as:  GLUCOPHAGE  Take 1,000 mg by mouth 2 (two) times daily with a meal.     metoprolol succinate 100 MG 24 hr tablet  Commonly known as:  TOPROL-XL  Take 1 tablet (100 mg total) by mouth daily. Take with or immediately following a meal.     omeprazole 20 MG capsule  Commonly known as:  PRILOSEC  Take 20 mg by mouth daily.     polyethylene glycol packet  Commonly known as:  MIRALAX / GLYCOLAX  Take 17 g by mouth daily.     rosuvastatin 40  MG tablet  Commonly known as:  CRESTOR  Take 1 tablet (40 mg total) by mouth daily.     VICTOZA 18 MG/3ML Sopn  Generic drug:  Liraglutide  Inject 1.2 mg into the skin daily. Inject once daily at the same time        Allergies:  Allergies  Allergen Reactions  . Ace Inhibitors Cough  . Shellfish Allergy     Pt cannot recall at this time    Past Medical History  Diagnosis Date  . Hypertension   . Diabetes mellitus without complication   . ALLERGIC RHINITIS   . ASTHMA   . Esophageal reflux   . SINUSITIS, ACUTE     Past Surgical History  Procedure Laterality Date  . Abdominal hysterectomy    . Breast surgery      Family History  Problem Relation Age of Onset  . Diabetes Mother   . Heart attack Father   . Heart disease Father     Social History:  reports that she has never smoked. She does not have any smokeless tobacco history on file. She reports that she does not drink alcohol or use illicit drugs.  Review of Systems:  Hypertension:  has good control with amlodipine, clonidine and Avalide, followed by cardiologist and PCP  Renal function: Creatinine high  On Mobic  Lipids: Have not been controlled well . Does have a history of CAD also.  She again claims that she is taking her Crestor and Zetia every day   does not think she has any difficulties with out-of-pocket expense and insurance coverage Her LDL is higher than before  Lab Results  Component Value Date   CHOL 263* 07/03/2013   HDL 48.10 07/03/2013   LDLCALC 187* 07/03/2013   LDLDIRECT 189.5 10/11/2013   TRIG 142.0 07/03/2013   CHOLHDL 5 07/03/2013    Neuropathy: She previously had a burning pain in her feet treated with gabapentin, no symptoms recently     LABS:  No visits with results within 1 Week(s) from this visit. Latest known visit with results is:  Appointment on 02/04/2014  Component Date Value Ref Range Status  . Hgb A1c MFr Bld 02/04/2014 7.6* 4.6 - 6.5 % Final   Glycemic  Control Guidelines for People with Diabetes:Non Diabetic:  <6%Goal of Therapy: <7%Additional Action Suggested:  >8%   . Sodium 02/04/2014 140  135 - 145 mEq/L Final  . Potassium 02/04/2014 3.9  3.5 - 5.1 mEq/L Final  . Chloride 02/04/2014 103  96 - 112 mEq/L Final  . CO2 02/04/2014 28  19 - 32 mEq/L Final  . Glucose, Bld 02/04/2014 97  70 - 99 mg/dL Final  . BUN 16/11/9602 16  6 - 23 mg/dL Final  . Creatinine, Ser 02/04/2014 1.4* 0.4 - 1.2 mg/dL Final  . Calcium 54/10/8117 9.7  8.4 - 10.5 mg/dL Final  . GFR 14/78/2956 46.44* >60.00 mL/min Final     Examination:   BP 204/98 mmHg  Pulse 68  Temp(Src) 98.5 F (36.9 C)  Resp 14  Ht 5' 2.5" (1.588 m)  Wt 223 lb 6.4 oz (101.334 kg)  BMI 40.18 kg/m2  SpO2 98%  Body mass index is 40.18 kg/(m^2).    ASSESSMENT/ PLAN:    Diabetes type 2:  Blood glucose control is not as good now as it was when she first started Victoza mid- 2015.  Her weight has not come down any further This may be related to her inconsistent diet and exercise However her fasting blood sugars are relatively low including some hypoglycemia early morning She has not checked readings after meals as directed and not clear of her postprandial readings However she has 2 readings after supper which are either low or low normal  Most likely she will require less insulin overall and discussed the need to adjust insulin based on patterns of fasting and postprandial readings for Lantus and NovoLog respectively A1c is at her target of 7%  Recommendations: Day-to-day management was discussed with patient   Continue Victoza 1.2 mg  Reduce NovoLog at suppertime by at least 2 units and adjust based on meal size and content  Reduce Lantus by 4 units for now and further if fasting blood sugars are low normal   More consistent monitoring after meals instead of just in the morning  Regular exercise   Continue improving diet  Discussed blood sugar targets  Lipids: She claims  she is compliant with her medications but LDL tends to be significantly high with Crestor and Zetia Discussed starting Repatha and how this would work as well as benefits and possible side effects. Prior authorization to be done  Hypertension: Blood pressure is poorly controlled, she does need to follow-up with her PCP soon Does not have microalbuminuria  Counseling time over 50% of today's 25 minute visit  Nancyann Cotterman 03/15/2014, 9:07 AM

## 2014-03-21 ENCOUNTER — Other Ambulatory Visit: Payer: Self-pay

## 2014-03-21 MED ORDER — METFORMIN HCL 1000 MG PO TABS: 1000.0000 mg | ORAL_TABLET | Freq: Two times a day (BID) | ORAL | Status: DC

## 2014-03-26 ENCOUNTER — Other Ambulatory Visit: Payer: Self-pay

## 2014-03-26 MED ORDER — LIRAGLUTIDE 18 MG/3ML ~~LOC~~ SOPN: 1.2000 mg | PEN_INJECTOR | Freq: Every day | SUBCUTANEOUS | Status: DC

## 2014-04-12 ENCOUNTER — Other Ambulatory Visit (INDEPENDENT_AMBULATORY_CARE_PROVIDER_SITE_OTHER): Payer: 59

## 2014-04-12 DIAGNOSIS — IMO0002 Reserved for concepts with insufficient information to code with codable children: Secondary | ICD-10-CM

## 2014-04-12 DIAGNOSIS — E782 Mixed hyperlipidemia: Secondary | ICD-10-CM

## 2014-04-12 DIAGNOSIS — E1165 Type 2 diabetes mellitus with hyperglycemia: Secondary | ICD-10-CM

## 2014-04-12 LAB — COMPREHENSIVE METABOLIC PANEL
ALT: 18 U/L (ref 0–35)
AST: 22 U/L (ref 0–37)
Albumin: 3.9 g/dL (ref 3.5–5.2)
Alkaline Phosphatase: 62 U/L (ref 39–117)
BILIRUBIN TOTAL: 0.6 mg/dL (ref 0.2–1.2)
BUN: 22 mg/dL (ref 6–23)
CO2: 32 meq/L (ref 19–32)
CREATININE: 1.19 mg/dL (ref 0.40–1.20)
Calcium: 9.8 mg/dL (ref 8.4–10.5)
Chloride: 105 mEq/L (ref 96–112)
GFR: 57.84 mL/min — AB (ref >60.00)
GLUCOSE: 140 mg/dL — AB (ref 70–99)
Potassium: 3.7 mEq/L (ref 3.5–5.1)
Sodium: 140 mEq/L (ref 135–145)
TOTAL PROTEIN: 6.9 g/dL (ref 6.0–8.3)

## 2014-04-12 LAB — LIPID PANEL
CHOL/HDL RATIO: 3
Cholesterol: 148 mg/dL (ref 0–200)
HDL: 43.7 mg/dL (ref >39.00)
LDL CALC: 70 mg/dL (ref 0–99)
NonHDL: 104.3
TRIGLYCERIDES: 171 mg/dL — AB (ref 0.0–149.0)
VLDL: 34.2 mg/dL (ref 0.0–40.0)

## 2014-04-13 LAB — FRUCTOSAMINE: Fructosamine: 276 umol/L (ref 0–285)

## 2014-04-17 ENCOUNTER — Ambulatory Visit: Payer: 59 | Admitting: Endocrinology

## 2014-04-17 ENCOUNTER — Telehealth: Payer: Self-pay | Admitting: Endocrinology

## 2014-04-17 NOTE — Telephone Encounter (Signed)
Patient no showed today's appt. Please advise on how to follow up. °A. No follow up necessary. °B. Follow up urgent. Contact patient immediately. °C. Follow up necessary. Contact patient and schedule visit in ___ days. °D. Follow up advised. Contact patient and schedule visit in ____weeks. ° °

## 2014-04-24 NOTE — Telephone Encounter (Signed)
Needs to be scheduled as soon as possible  

## 2014-05-13 ENCOUNTER — Encounter: Payer: Self-pay | Admitting: Podiatry

## 2014-05-13 ENCOUNTER — Ambulatory Visit (INDEPENDENT_AMBULATORY_CARE_PROVIDER_SITE_OTHER): Payer: Medicare Other | Admitting: Podiatry

## 2014-05-13 DIAGNOSIS — M79676 Pain in unspecified toe(s): Secondary | ICD-10-CM

## 2014-05-13 DIAGNOSIS — B351 Tinea unguium: Secondary | ICD-10-CM

## 2014-05-13 NOTE — Patient Instructions (Signed)
Diabetes and Foot Care Diabetes may cause you to have problems because of poor blood supply (circulation) to your feet and legs. This may cause the skin on your feet to become thinner, break easier, and heal more slowly. Your skin may become dry, and the skin may peel and crack. You may also have nerve damage in your legs and feet causing decreased feeling in them. You may not notice minor injuries to your feet that could lead to infections or more serious problems. Taking care of your feet is one of the most important things you can do for yourself.  HOME CARE INSTRUCTIONS  Wear shoes at all times, even in the house. Do not go barefoot. Bare feet are easily injured.  Check your feet daily for blisters, cuts, and redness. If you cannot see the bottom of your feet, use a mirror or ask someone for help.  Wash your feet with warm water (do not use hot water) and mild soap. Then pat your feet and the areas between your toes until they are completely dry. Do not soak your feet as this can dry your skin.  Apply a moisturizing lotion or petroleum jelly (that does not contain alcohol and is unscented) to the skin on your feet and to dry, brittle toenails. Do not apply lotion between your toes.  Trim your toenails straight across. Do not dig under them or around the cuticle. File the edges of your nails with an emery board or nail file.  Do not cut corns or calluses or try to remove them with medicine.  Wear clean socks or stockings every day. Make sure they are not too tight. Do not wear knee-high stockings since they may decrease blood flow to your legs.  Wear shoes that fit properly and have enough cushioning. To break in new shoes, wear them for just a few hours a day. This prevents you from injuring your feet. Always look in your shoes before you put them on to be sure there are no objects inside.  Do not cross your legs. This may decrease the blood flow to your feet.  If you find a minor scrape,  cut, or break in the skin on your feet, keep it and the skin around it clean and dry. These areas may be cleansed with mild soap and water. Do not cleanse the area with peroxide, alcohol, or iodine.  When you remove an adhesive bandage, be sure not to damage the skin around it.  If you have a wound, look at it several times a day to make sure it is healing.  Do not use heating pads or hot water bottles. They may burn your skin. If you have lost feeling in your feet or legs, you may not know it is happening until it is too late.  Make sure your health care provider performs a complete foot exam at least annually or more often if you have foot problems. Report any cuts, sores, or bruises to your health care provider immediately. SEEK MEDICAL CARE IF:   You have an injury that is not healing.  You have cuts or breaks in the skin.  You have an ingrown nail.  You notice redness on your legs or feet.  You feel burning or tingling in your legs or feet.  You have pain or cramps in your legs and feet.  Your legs or feet are numb.  Your feet always feel cold. SEEK IMMEDIATE MEDICAL CARE IF:   There is increasing redness,   swelling, or pain in or around a wound.  There is a red line that goes up your leg.  Pus is coming from a wound.  You develop a fever or as directed by your health care provider.  You notice a bad smell coming from an ulcer or wound. Document Released: 01/16/2000 Document Revised: 09/20/2012 Document Reviewed: 06/27/2012 Exit[employer] Patient Information 2015 [employer]. This information is not intended to replace advice given to you by your health care provider. Make sure you discuss any questions you have with your health care provider.  

## 2014-05-13 NOTE — Progress Notes (Signed)
Patient ID: Heidi AloeEvon V Sporrer, female   DOB: Dec 14, 1945, 69 y.o.   MRN: [id number]  Subjective: This patient presents for ongoing debridement of painful toenails  Objective: Orientated 3 The toenails are elongated, hypertrophic, discolored, incurvated and to render to direct palpation 6-10 Small keratoses fifth left toe  Assessment: Symptomatic onychomycoses 6-10 Type II diabetic  Plan: Debridement toenails 10 and keratoses 1 without any bleeding  Continue well or gel Sleeve and second left toe  Reappoint 3 months

## 2014-05-31 ENCOUNTER — Telehealth: Payer: Self-pay | Admitting: Endocrinology

## 2014-05-31 NOTE — Telephone Encounter (Signed)
[employer] following up on the clarification of the members dx fax for repatha  # [phone] Heidi Campbell

## 2014-06-20 ENCOUNTER — Ambulatory Visit (INDEPENDENT_AMBULATORY_CARE_PROVIDER_SITE_OTHER): Payer: Medicare Other | Admitting: Interventional Cardiology

## 2014-06-20 ENCOUNTER — Encounter: Payer: Self-pay | Admitting: Interventional Cardiology

## 2014-06-20 VITALS — BP 142/82 | HR 70 | Ht 62.5 in | Wt 216.8 lb

## 2014-06-20 DIAGNOSIS — E669 Obesity, unspecified: Secondary | ICD-10-CM | POA: Diagnosis not present

## 2014-06-20 DIAGNOSIS — I6522 Occlusion and stenosis of left carotid artery: Secondary | ICD-10-CM | POA: Diagnosis not present

## 2014-06-20 DIAGNOSIS — I2510 Atherosclerotic heart disease of native coronary artery without angina pectoris: Secondary | ICD-10-CM | POA: Diagnosis not present

## 2014-06-20 DIAGNOSIS — I10 Essential (primary) hypertension: Secondary | ICD-10-CM | POA: Diagnosis not present

## 2014-06-20 DIAGNOSIS — I779 Disorder of arteries and arterioles, unspecified: Secondary | ICD-10-CM

## 2014-06-20 DIAGNOSIS — E785 Hyperlipidemia, unspecified: Secondary | ICD-10-CM

## 2014-06-20 DIAGNOSIS — I739 Peripheral vascular disease, unspecified: Secondary | ICD-10-CM

## 2014-06-20 NOTE — Progress Notes (Signed)
Patient ID: Heidi Campbell, female   DOB: 1945-11-15, 69 y.o.   MRN: [id number]     Cardiology Office Note   Date:  06/20/2014   ID:  Heidi Aloevon V Lemar, DOB 1945-11-15, MRN [id number]  PCP:  Astrid DivineGRIFFIN,ELAINE COLLINS, MD    No chief complaint on file.    Wt Readings from Last 3 Encounters:  06/20/14 216 lb 12.8 oz (98.34 kg)  03/15/14 223 lb 6.4 oz (101.334 kg)  12/10/13 222 lb 12.8 oz (101.061 kg)       History of Present Illness: Heidi Aloevon V Stemmer is a 69 y.o. female  who has had medically managed CAD, from cath 2012. Her husband passed away from prostate CA in 2014. SHe has not been checking her BP at home. Blood sugar and DOE are improving. She uses a stationary bike for PT.  She does 15 minutes 3x/day.   Limited by both DOE and hip pain.  She is gettig hip injections.  CAD/ASCVD:  Denies : Chest pain.  Diet.  Dyspnea on exertion.  Fatigue.   c/o Leg edema. Right foot, intermittent   Over the past few weeks, BP has been controled, Now using clonidine BID.   BP is much better controlled. No bleeding problems.    Past Medical History  Diagnosis Date  . Hypertension   . Diabetes mellitus without complication   . ALLERGIC RHINITIS   . ASTHMA   . Esophageal reflux   . SINUSITIS, ACUTE     Past Surgical History  Procedure Laterality Date  . Abdominal hysterectomy    . Breast surgery       Current Outpatient Prescriptions  Medication Sig Dispense Refill  . albuterol (PROVENTIL HFA;VENTOLIN HFA) 108 (90 BASE) MCG/ACT inhaler Inhale 2 puffs into the lungs every 6 (six) hours as needed for wheezing or shortness of breath.    Marland Kitchen. amLODipine (NORVASC) 10 MG tablet Take 10 mg by mouth daily.    Marland Kitchen. aspirin 81 MG tablet Take 81 mg by mouth daily.    . cloNIDine (CATAPRES) 0.2 MG tablet Take 0.5 tablets (0.1 mg total) by mouth 2 (two) times daily.    Marland Kitchen. ezetimibe (ZETIA) 10 MG tablet Take 1 tablet (10 mg total) by mouth daily. 30 tablet 3  . fluticasone (FLONASE) 50 MCG/ACT  nasal spray Place 1 spray into both nostrils daily.    . Fluticasone-Salmeterol (ADVAIR) 100-50 MCG/DOSE AEPB Inhale 1 puff into the lungs as needed.    Marland Kitchen. glucose blood (ONETOUCH VERIO) test strip Use as instructed to check blood sugars 2 times per day, dx code 250.00 100 each 5  . insulin aspart (NOVOLOG FLEXPEN) 100 UNIT/ML FlexPen Inject 15 Units into the skin 2 (two) times daily. 15 mL 2  . Insulin Glargine (LANTUS) 100 UNIT/ML Solostar Pen Inject 34 Units into the skin daily at 10 pm. 15 mL 2  . irbesartan-hydrochlorothiazide (AVALIDE) 300-12.5 MG per tablet Take 1 tablet by mouth daily.    Marland Kitchen. levothyroxine (SYNTHROID, LEVOTHROID) 125 MCG tablet Take 125 mcg by mouth daily before breakfast.    . Liraglutide (VICTOZA) 18 MG/3ML SOPN Inject 0.2 mLs (1.2 mg total) into the skin daily. Inject once daily at the same time 6 mL 2  . meloxicam (MOBIC) 15 MG tablet Take 15 mg by mouth as needed for pain.    . metFORMIN (GLUCOPHAGE) 1000 MG tablet Take 1 tablet (1,000 mg total) by mouth 2 (two) times daily with a meal. 60 tablet 2  . metoprolol succinate (TOPROL-XL)  100 MG 24 hr tablet Take 1 tablet (100 mg total) by mouth daily. Take with or immediately following a meal. 90 tablet 3  . omeprazole (PRILOSEC) 20 MG capsule Take 20 mg by mouth daily.    . polyethylene glycol (MIRALAX / GLYCOLAX) packet Take 17 g by mouth daily.    . rosuvastatin (CRESTOR) 40 MG tablet Take 1 tablet (40 mg total) by mouth daily. 30 tablet 6   No current facility-administered medications for this visit.    Allergies:   Ace inhibitors and Shellfish allergy    Social History:  The patient  reports that she has never smoked. She does not have any smokeless tobacco history on file. She reports that she does not drink alcohol or use illicit drugs.   Family History:  The patient's *family history includes Diabetes in her mother; Heart attack in her father; Heart disease in her father.    ROS:  Please see the history of  present illness.   Otherwise, review of systems are positive for joint pain and SHOB*.   All other systems are reviewed and negative.    PHYSICAL EXAM: VS:  BP 142/82 mmHg  Pulse 70  Ht 5' 2.5" (1.588 m)  Wt 216 lb 12.8 oz (98.34 kg)  BMI 39.00 kg/m2  SpO2 98% , BMI Body mass index is 39 kg/(m^2). GEN: Well nourished, well developed, in no acute distress HEENT: normal Neck: no JVD, 2/6 left carotid bruit, no masses Cardiac: RRR; no murmurs, rubs, or gallops,no edema  Respiratory:  clear to auscultation bilaterally, normal work of breathing GI: soft, nontender, nondistended, + BS MS: no deformity or atrophy Skin: warm and dry, no rash Neuro:  Strength and sensation are intact Psych: euthymic mood, full affect      Recent Labs: 04/12/2014: ALT 18; BUN 22; Creatinine 1.19; Potassium 3.7; Sodium 140   Lipid Panel    Component Value Date/Time   CHOL 148 04/12/2014 1030   TRIG 171.0* 04/12/2014 1030   HDL 43.70 04/12/2014 1030   CHOLHDL 3 04/12/2014 1030   VLDL 34.2 04/12/2014 1030   LDLCALC 70 04/12/2014 1030   LDLDIRECT 189.5 10/11/2013 0850     Other studies Reviewed: Additional studies/ records that were reviewed today with results demonstrating: Last HbA1C 7.6.   ASSESSMENT AND PLAN:  Essential hypertension, benign  Continue Amlodipine Besylate Tablet, 10 MG, 1 tablet, Orally, Once a day, 90 days, 90, Refills 3 Continue Irbesartan-Hydrochlorothiazide Tablet, 300-12.5 MG, 1 tablet, Orally, Once a day, 90 day(s), 90, Refills 3 Refill Toprol XL Tablet Extended Release 24 Hour, 100 MG, 1 tablet, Orally, Once a day, 90 days, 90, Refills 3 Notes: Well controlled today. Improving with weight loss. Check BP at home. Could increase frequency of clonidine.  If her blood pressure spikes above 160 systolic, she should take an extra clonidine tablet.  CAD in native artery :       Notes: Moderate by prior cath. No angina. Tolerated her surgeries well.    3. Morbid  obesity with BMI of 40.0-44.9, adult  Notes: Spoke about the importance of weight loss. She will try to increase activity and try different exercises.. She can improve her diet as well. Weight loss has helped BP. She has lost 40 lbs in the past year through diet control. Hopefully exercise can increase as her knee improves.  4. Diabetes: managed by PCP and endocrine, Dr. Lucianne Muss.      5. Carotid artery disease: 50-69 in Left ICA in 2014.  WIll  recheck. Left bruit persists.  Current medicines are reviewed at length with the patient today.  The patient concerns regarding her medicines were addressed.                  The following changes have been made:  No change  Labs/ tests ordered today include: none No orders of the defined types were placed in this encounter.    Recommend 150 minutes/week of aerobic exercise Low fat, low carb, high fiber diet recommended  Disposition:   FU in 1 year   Delorise JacksonSigned, Willadean Guyton S., MD  06/20/2014 11:25 AM    [facility] [address], [place], [place]  [address] Phone: [phone]; Fax: [phone]

## 2014-06-20 NOTE — Patient Instructions (Signed)
Medication Instructions:  Your physician recommends that you continue on your current medications as directed. Please refer to the Current Medication list given to you today.   Labwork: None   Testing/Procedures: Your physician has requested that you have a carotid duplex. This test is an ultrasound of the carotid arteries in your neck. It looks at blood flow through these arteries that supply the brain with blood. Allow one hour for this exam. There are no restrictions or special instructions.   Follow-Up: Your physician wants you to follow-up in: 1 year with Dr.Varanasi You will receive a reminder letter in the mail two months in advance. If you don't receive a letter, please call our office to schedule the follow-up appointment.   Any Other Special Instructions Will Be Listed Below (If Applicable).

## 2014-06-27 ENCOUNTER — Ambulatory Visit (HOSPITAL_COMMUNITY): Payer: Medicare Other | Attending: Interventional Cardiology

## 2014-06-27 DIAGNOSIS — I6522 Occlusion and stenosis of left carotid artery: Secondary | ICD-10-CM

## 2014-06-27 DIAGNOSIS — I6523 Occlusion and stenosis of bilateral carotid arteries: Secondary | ICD-10-CM | POA: Insufficient documentation

## 2014-07-02 ENCOUNTER — Other Ambulatory Visit: Payer: Self-pay | Admitting: *Deleted

## 2014-07-02 DIAGNOSIS — I739 Peripheral vascular disease, unspecified: Principal | ICD-10-CM

## 2014-07-02 DIAGNOSIS — I779 Disorder of arteries and arterioles, unspecified: Secondary | ICD-10-CM

## 2014-07-02 DIAGNOSIS — E785 Hyperlipidemia, unspecified: Secondary | ICD-10-CM

## 2014-07-26 ENCOUNTER — Encounter (HOSPITAL_COMMUNITY): Payer: Self-pay | Admitting: Emergency Medicine

## 2014-07-26 ENCOUNTER — Emergency Department (HOSPITAL_COMMUNITY): Payer: Medicare Other

## 2014-07-26 ENCOUNTER — Emergency Department (HOSPITAL_COMMUNITY)
Admission: EM | Admit: 2014-07-26 | Discharge: 2014-07-26 | Disposition: A | Discharge status: Home / Self Care | Payer: Medicare Other | Attending: Emergency Medicine | Admitting: Emergency Medicine

## 2014-07-26 DIAGNOSIS — Z7951 Long term (current) use of inhaled steroids: Secondary | ICD-10-CM | POA: Diagnosis not present

## 2014-07-26 DIAGNOSIS — E119 Type 2 diabetes mellitus without complications: Secondary | ICD-10-CM | POA: Diagnosis not present

## 2014-07-26 DIAGNOSIS — R110 Nausea: Secondary | ICD-10-CM | POA: Insufficient documentation

## 2014-07-26 DIAGNOSIS — K219 Gastro-esophageal reflux disease without esophagitis: Secondary | ICD-10-CM | POA: Diagnosis not present

## 2014-07-26 DIAGNOSIS — I10 Essential (primary) hypertension: Secondary | ICD-10-CM

## 2014-07-26 DIAGNOSIS — Z79899 Other long term (current) drug therapy: Secondary | ICD-10-CM | POA: Diagnosis not present

## 2014-07-26 DIAGNOSIS — J45909 Unspecified asthma, uncomplicated: Secondary | ICD-10-CM | POA: Diagnosis not present

## 2014-07-26 DIAGNOSIS — Z794 Long term (current) use of insulin: Secondary | ICD-10-CM | POA: Insufficient documentation

## 2014-07-26 DIAGNOSIS — Z7982 Long term (current) use of aspirin: Secondary | ICD-10-CM | POA: Diagnosis not present

## 2014-07-26 DIAGNOSIS — R51 Headache: Secondary | ICD-10-CM | POA: Diagnosis present

## 2014-07-26 LAB — COMPREHENSIVE METABOLIC PANEL
ALK PHOS: 96 U/L (ref 38–126)
ALT: 27 U/L (ref 14–54)
AST: 30 U/L (ref 15–41)
Albumin: 4.3 g/dL (ref 3.5–5.0)
Anion gap: 10 (ref 5–15)
BILIRUBIN TOTAL: 0.9 mg/dL (ref 0.3–1.2)
BUN: 10 mg/dL (ref 6–20)
CALCIUM: 9.6 mg/dL (ref 8.9–10.3)
CO2: 27 mmol/L (ref 22–32)
Chloride: 98 mmol/L — ABNORMAL LOW (ref 101–111)
Creatinine, Ser: 1.07 mg/dL — ABNORMAL HIGH (ref 0.44–1.00)
GFR, EST AFRICAN AMERICAN: 60 mL/min — AB (ref >60)
GFR, EST NON AFRICAN AMERICAN: 52 mL/min — AB (ref >60)
GLUCOSE: 447 mg/dL — AB (ref 65–99)
Potassium: 3.7 mmol/L (ref 3.5–5.1)
SODIUM: 135 mmol/L (ref 135–145)
Total Protein: 8.4 g/dL — ABNORMAL HIGH (ref 6.5–8.1)

## 2014-07-26 LAB — URINALYSIS, ROUTINE W REFLEX MICROSCOPIC
Bilirubin Urine: NEGATIVE
HGB URINE DIPSTICK: NEGATIVE
Ketones, ur: NEGATIVE mg/dL
Leukocytes, UA: NEGATIVE
Nitrite: NEGATIVE
PH: 7 (ref 5.0–8.0)
Protein, ur: NEGATIVE mg/dL
SPECIFIC GRAVITY, URINE: 1.04 — AB (ref 1.005–1.030)
Urobilinogen, UA: 1 mg/dL (ref 0.0–1.0)

## 2014-07-26 LAB — CBG MONITORING, ED: GLUCOSE-CAPILLARY: 397 mg/dL — AB (ref 65–99)

## 2014-07-26 LAB — CBC WITH DIFFERENTIAL/PLATELET
BASOS ABS: 0 10*3/uL (ref 0.0–0.1)
BASOS PCT: 0 % (ref 0–1)
Eosinophils Absolute: 0 10*3/uL (ref 0.0–0.7)
Eosinophils Relative: 1 % (ref 0–5)
HCT: 42.3 % (ref 36.0–46.0)
HEMOGLOBIN: 14.1 g/dL (ref 12.0–15.0)
LYMPHS ABS: 2.2 10*3/uL (ref 0.7–4.0)
Lymphocytes Relative: 31 % (ref 12–46)
MCH: 28.6 pg (ref 26.0–34.0)
MCHC: 33.3 g/dL (ref 30.0–36.0)
MCV: 85.8 fL (ref 78.0–100.0)
MONOS PCT: 6 % (ref 3–12)
Monocytes Absolute: 0.5 10*3/uL (ref 0.1–1.0)
NEUTROS ABS: 4.4 10*3/uL (ref 1.7–7.7)
NEUTROS PCT: 62 % (ref 43–77)
Platelets: 214 10*3/uL (ref 150–400)
RBC: 4.93 MIL/uL (ref 3.87–5.11)
RDW: 12.9 % (ref 11.5–15.5)
WBC: 7.1 10*3/uL (ref 4.0–10.5)

## 2014-07-26 LAB — URINE MICROSCOPIC-ADD ON

## 2014-07-26 MED ORDER — HYDRALAZINE HCL 20 MG/ML IJ SOLN
10.0000 mg | Freq: Once | INTRAMUSCULAR | Status: AC
  Administered 2014-07-26: 10 mg via INTRAVENOUS
  Filled 2014-07-26: qty 1

## 2014-07-26 MED ORDER — KETOROLAC TROMETHAMINE 30 MG/ML IJ SOLN
30.0000 mg | Freq: Once | INTRAMUSCULAR | Status: AC
  Administered 2014-07-26: 30 mg via INTRAVENOUS
  Filled 2014-07-26: qty 1

## 2014-07-26 MED ORDER — AMLODIPINE BESYLATE 10 MG PO TABS: 10.0000 mg | ORAL_TABLET | Freq: Every day | ORAL | Status: AC

## 2014-07-26 MED ORDER — INSULIN ASPART 100 UNIT/ML ~~LOC~~ SOLN
5.0000 [IU] | Freq: Once | SUBCUTANEOUS | Status: AC
  Administered 2014-07-26: 5 [IU] via INTRAVENOUS
  Filled 2014-07-26: qty 1

## 2014-07-26 MED ORDER — METOCLOPRAMIDE HCL 5 MG/ML IJ SOLN
10.0000 mg | Freq: Once | INTRAMUSCULAR | Status: AC
  Administered 2014-07-26: 10 mg via INTRAVENOUS
  Filled 2014-07-26: qty 2

## 2014-07-26 MED ORDER — DIPHENHYDRAMINE HCL 50 MG/ML IJ SOLN
25.0000 mg | Freq: Once | INTRAMUSCULAR | Status: AC
  Administered 2014-07-26: 25 mg via INTRAVENOUS
  Filled 2014-07-26: qty 1

## 2014-07-26 MED ORDER — DEXAMETHASONE SODIUM PHOSPHATE 10 MG/ML IJ SOLN
10.0000 mg | Freq: Once | INTRAMUSCULAR | Status: AC
  Administered 2014-07-26: 10 mg via INTRAVENOUS
  Filled 2014-07-26: qty 1

## 2014-07-26 NOTE — ED Provider Notes (Signed)
Patient rechecked several times. No neurological deficits. Blood pressure has reduced. Will add Norvasc 10 mg daily to blood pressure regimen. Glucose also noted be elevated. She is not in DKA.  Donnetta Hutching, MD 07/26/14 [phone]

## 2014-07-26 NOTE — Discharge Instructions (Signed)
We will add an additional blood pressure medication.  Prescription given. Check your blood pressure daily. Follow-up your primary care doctor. Also your glucose was elevated today. This will need monitoring.

## 2014-07-26 NOTE — ED Notes (Signed)
Pt c/o headache since yesterday.  Pt states that she took her BP meds today.  Has not been checking her sugar either.  Very hypertensive in triage.

## 2014-07-26 NOTE — ED Provider Notes (Signed)
CSN: [id number]     Arrival date & time 07/26/14  1338 History   First MD Initiated Contact with Patient 07/26/14 1357     Chief Complaint  Patient presents with  . Headache  . Hypertension     (Consider location/radiation/quality/duration/timing/severity/associated sxs/prior Treatment) Patient is a 69 y.o. female presenting with headaches and hypertension.  Headache Pain location:  Generalized Quality:  Dull Radiates to:  Does not radiate Onset quality:  Gradual Duration:  1 day Timing:  Constant Progression:  Worsening Chronicity:  New Context comment:  Hypertensive to 268/113, compliant with home meds Relieved by:  Nothing Worsened by:  Activity Ineffective treatments:  None tried Associated symptoms: eye pain and nausea   Associated symptoms: no abdominal pain, no neck stiffness, no numbness, no URI and no vomiting   Hypertension Associated symptoms include headaches. Pertinent negatives include no abdominal pain.    Past Medical History  Diagnosis Date  . Hypertension   . Diabetes mellitus without complication   . ALLERGIC RHINITIS   . ASTHMA   . Esophageal reflux   . SINUSITIS, ACUTE    Past Surgical History  Procedure Laterality Date  . Abdominal hysterectomy    . Breast surgery     Family History  Problem Relation Age of Onset  . Diabetes Mother   . Heart attack Father   . Heart disease Father    History  Substance Use Topics  . Smoking status: Never Smoker   . Smokeless tobacco: Not on file  . Alcohol Use: No   OB History    No data available     Review of Systems  Eyes: Positive for pain.  Gastrointestinal: Positive for nausea. Negative for vomiting and abdominal pain.  Musculoskeletal: Negative for neck stiffness.  Neurological: Positive for headaches. Negative for numbness.  All other systems reviewed and are negative.     Allergies  Ace inhibitors and Shellfish allergy  Home Medications   Prior to Admission medications    Medication Sig Start Date End Date Taking? Authorizing Provider  albuterol (PROVENTIL HFA;VENTOLIN HFA) 108 (90 BASE) MCG/ACT inhaler Inhale 2 puffs into the lungs every 6 (six) hours as needed for wheezing or shortness of breath.   Yes Historical Provider, MD  aspirin 81 MG tablet Take 81 mg by mouth daily.   Yes Historical Provider, MD  cloNIDine (CATAPRES) 0.2 MG tablet Take 0.5 tablets (0.1 mg total) by mouth 2 (two) times daily. 10/12/13  Yes Corky Crafts, MD  ezetimibe (ZETIA) 10 MG tablet Take 1 tablet (10 mg total) by mouth daily. 07/20/13  Yes Reather Littler, MD  fluticasone (FLONASE) 50 MCG/ACT nasal spray Place 1 spray into both nostrils daily as needed for allergies.    Yes Historical Provider, MD  Fluticasone-Salmeterol (ADVAIR) 100-50 MCG/DOSE AEPB Inhale 1 puff into the lungs as needed.   Yes Historical Provider, MD  glucose blood (ONETOUCH VERIO) test strip Use as instructed to check blood sugars 2 times per day, dx code 250.00 02/19/13  Yes Reather Littler, MD  insulin aspart (NOVOLOG FLEXPEN) 100 UNIT/ML FlexPen Inject 15 Units into the skin 2 (two) times daily. 10/26/13  Yes Romero Belling, MD  Insulin Glargine (LANTUS) 100 UNIT/ML Solostar Pen Inject 34 Units into the skin daily at 10 pm. 11/21/13  Yes Reather Littler, MD  irbesartan-hydrochlorothiazide (AVALIDE) 300-12.5 MG per tablet Take 1 tablet by mouth daily.   Yes Historical Provider, MD  Liraglutide (VICTOZA) 18 MG/3ML SOPN Inject 0.2 mLs (1.2 mg total) into the  skin daily. Inject once daily at the same time 03/26/14  Yes Reather Littler, MD  meloxicam (MOBIC) 15 MG tablet Take 15 mg by mouth 2 (two) times daily as needed for pain.    Yes Historical Provider, MD  metFORMIN (GLUCOPHAGE) 1000 MG tablet Take 1 tablet (1,000 mg total) by mouth 2 (two) times daily with a meal. 03/21/14  Yes Reather Littler, MD  metoprolol succinate (TOPROL-XL) 100 MG 24 hr tablet Take 1 tablet (100 mg total) by mouth daily. Take with or immediately following a meal.  10/12/13  Yes Corky Crafts, MD  omeprazole (PRILOSEC) 20 MG capsule Take 20 mg by mouth daily.   Yes Historical Provider, MD  polyethylene glycol (MIRALAX / GLYCOLAX) packet Take 17 g by mouth daily as needed for moderate constipation.    Yes Historical Provider, MD  rosuvastatin (CRESTOR) 40 MG tablet Take 1 tablet (40 mg total) by mouth daily. 12/22/12  Yes Corky Crafts, MD  amLODipine (NORVASC) 10 MG tablet Take 1 tablet (10 mg total) by mouth daily. 07/26/14   Donnetta Hutching, MD   BP 142/60 mmHg  Pulse 58  Temp(Src) 98.9 F (37.2 C) (Oral)  Resp 18  Ht 5\' 3"  (1.6 m)  Wt 230 lb (104.327 kg)  BMI 40.75 kg/m2  SpO2 97% Physical Exam  Constitutional: She is oriented to person, place, and time. She appears well-developed and well-nourished.  HENT:  Head: Normocephalic and atraumatic.  Right Ear: External ear normal.  Left Ear: External ear normal.  Eyes: Conjunctivae and EOM are normal. Pupils are equal, round, and reactive to light.  Neck: Normal range of motion. Neck supple.  Cardiovascular: Normal rate, regular rhythm, normal heart sounds and intact distal pulses.   Pulmonary/Chest: Effort normal and breath sounds normal.  Abdominal: Soft. Bowel sounds are normal. There is no tenderness.  Musculoskeletal: Normal range of motion.  Neurological: She is alert and oriented to person, place, and time. She has normal strength and normal reflexes. No cranial nerve deficit or sensory deficit. She displays a negative Romberg sign.  Skin: Skin is warm and dry.  Vitals reviewed.   ED Course  Procedures (including critical care time) Labs Review Labs Reviewed  COMPREHENSIVE METABOLIC PANEL - Abnormal; Notable for the following:    Chloride 98 (*)    Glucose, Bld 447 (*)    Creatinine, Ser 1.07 (*)    Total Protein 8.4 (*)    GFR calc non Af Amer 52 (*)    GFR calc Af Amer 60 (*)    All other components within normal limits  URINALYSIS, ROUTINE W REFLEX MICROSCOPIC (NOT AT  [facility]) - Abnormal; Notable for the following:    Specific Gravity, Urine 1.040 (*)    Glucose, UA >1000 (*)    All other components within normal limits  CBG MONITORING, ED - Abnormal; Notable for the following:    Glucose-Capillary 397 (*)    All other components within normal limits  CBC WITH DIFFERENTIAL/PLATELET  URINE MICROSCOPIC-ADD ON    Imaging Review No results found.   EKG Interpretation   Date/Time:  Friday July 26 2014 15:28:22 EDT Ventricular Rate:  66 PR Interval:  126 QRS Duration: 84 QT Interval:  414 QTC Calculation: 434 R Axis:   25 Text Interpretation:  Normal sinus rhythm Possible Anterior infarct , age  undetermined T wave abnormality, consider lateral ischemia Abnormal ECG No  significant change since last tracing Confirmed by Mirian Mo [id number])  on 07/26/2014 3:32:24 PM  CRITICAL CARE Performed by: Mirian Mo   Total critical care time: 35 min  Critical care time was exclusive of separately billable procedures and treating other patients.  Critical care was necessary to treat or prevent imminent or life-threatening deterioration.  Critical care was time spent personally by me on the following activities: development of treatment plan with patient and/or surrogate as well as nursing, discussions with consultants, evaluation of patient's response to treatment, examination of patient, obtaining history from patient or surrogate, ordering and performing treatments and interventions, ordering and review of laboratory studies, ordering and review of radiographic studies, pulse oximetry and re-evaluation of patient's condition.  MDM   Final diagnoses:  Essential hypertension    69 y.o. female with pertinent PMH of HTN, DM presents with headache as above in setting of htn.  Initial BP systolic 260.  After  hydralazine, BP improved to 180 systolic. HA improved.  No neuro deficits.  Pt care to Dr. Adriana Simas pending UA with plan to dc home,  hypertensive urgency.    I have reviewed all laboratory and imaging studies if ordered as above  1. Essential hypertension         Mirian Mo, MD 07/29/14 [phone]

## 2014-07-26 NOTE — ED Notes (Signed)
Patient left urine in restroom prior to CT, urine was dumped. Patient is aware we need urine

## 2014-07-26 NOTE — ED Notes (Signed)
Bed: WA06 Expected date:  Expected time:  Means of arrival:  Comments: Pt in room, waiting on ride

## 2014-07-26 NOTE — ED Notes (Signed)
Patient reports having urine urgency, frequency using the restroom every 15 to 20 minutes

## 2014-07-26 NOTE — ED Notes (Signed)
Pt reports headache and dizziness started last night around 20:30

## 2014-07-26 NOTE — ED Notes (Signed)
Patient in restroom- will perform EKG when finished with CT and x ray

## 2014-08-06 ENCOUNTER — Encounter: Payer: Self-pay | Admitting: Endocrinology

## 2014-08-06 ENCOUNTER — Ambulatory Visit (INDEPENDENT_AMBULATORY_CARE_PROVIDER_SITE_OTHER): Payer: Medicare Other | Admitting: Endocrinology

## 2014-08-06 DIAGNOSIS — I10 Essential (primary) hypertension: Secondary | ICD-10-CM

## 2014-08-06 DIAGNOSIS — E1165 Type 2 diabetes mellitus with hyperglycemia: Secondary | ICD-10-CM | POA: Diagnosis not present

## 2014-08-06 DIAGNOSIS — E782 Mixed hyperlipidemia: Secondary | ICD-10-CM

## 2014-08-06 DIAGNOSIS — IMO0002 Reserved for concepts with insufficient information to code with codable children: Secondary | ICD-10-CM

## 2014-08-06 LAB — POCT URINALYSIS DIPSTICK
Bilirubin, UA: NEGATIVE
GLUCOSE UA: 2000
Leukocytes, UA: NEGATIVE
NITRITE UA: NEGATIVE
Spec Grav, UA: 1.01
UROBILINOGEN UA: 0.2
pH, UA: 6.5

## 2014-08-06 LAB — BASIC METABOLIC PANEL
BUN: 14 mg/dL (ref 6–23)
CHLORIDE: 97 meq/L (ref 96–112)
CO2: 25 mEq/L (ref 19–32)
Calcium: 10.1 mg/dL (ref 8.4–10.5)
Creatinine, Ser: 1.53 mg/dL — ABNORMAL HIGH (ref 0.40–1.20)
GFR: 43.23 mL/min — ABNORMAL LOW (ref >60.00)
Glucose, Bld: 448 mg/dL — ABNORMAL HIGH (ref 70–99)
Potassium: 3.8 mEq/L (ref 3.5–5.1)
SODIUM: 133 meq/L — AB (ref 135–145)

## 2014-08-06 LAB — POCT GLUCOSE (DEVICE FOR HOME USE): GLUCOSE FASTING, POC: 512 mg/dL — AB (ref 70–99)

## 2014-08-06 LAB — LIPID PANEL
Cholesterol: 209 mg/dL — ABNORMAL HIGH (ref 0–200)
HDL: 44 mg/dL (ref >39.00)
NONHDL: 165
Total CHOL/HDL Ratio: 5
Triglycerides: 217 mg/dL — ABNORMAL HIGH (ref 0.0–149.0)
VLDL: 43.4 mg/dL — ABNORMAL HIGH (ref 0.0–40.0)

## 2014-08-06 LAB — HEMOGLOBIN A1C: Hgb A1c MFr Bld: 12.6 % — ABNORMAL HIGH (ref 4.6–6.5)

## 2014-08-06 LAB — LDL CHOLESTEROL, DIRECT: Direct LDL: 125 mg/dL

## 2014-08-06 NOTE — Patient Instructions (Addendum)
LANTUS 34 units and 20 in am  Keep Novolog same 3x daily but add more for highs:  > 200 add 6 units and >250 add 8 units and over 300 add 12 units  Stop Victoza and metfotmin

## 2014-08-06 NOTE — Progress Notes (Signed)
Patient ID: Heidi Campbell, female   DOB: [birth year], 69 y.o.   MRN: [id number]   Reason for Appointment: Diabetes follow-up   History of Present Illness   Diagnosis: Type 2 DIABETES MELITUS, date of diagnosis: 1998      Previous history: She was initially treated with oral hypoglycemic drugs but switched to insulin when she was hospitalized for asthma in 2007. She has been on metformin but only 500 mg twice a day Over most of her diabetes history has been taking premixed insulin, NovoLog mix 70/30, twice a day Although her A1c in the past has been close to 7% she would tend to have hyperglycemia at 11 AM or overnight with his insulin She was given Victoza in 6/15 because of persistent poor control with her insulin regimen  Recent history:  She had been on a regimen of basal bolus insulin, Victoza and metformin She has not been seen in follow-up since 2/16 when her A1c was 7.6 but her sugars were relatively low  She thinks her sugars have increased significantly over the last 2-3 weeks but she is unclear why they are increased She also has been symptomatic with increased thirst and appears to have lost weight She was seen in the emergency room for high sugars but no changes were made in her insulin, was not showing any ketonuria or acidosis She has been compliant with her Victoza and metformin  Current blood sugar patterns and problems identified:  She is checking her blood sugars with a generic monitor and this was not reviewed, has no written record  She does not remember her readings exactly but they appear to be at least around 250 or more both morning and late evening  She has not eaten much because of decreased appetite but does not have any nausea or vomiting; she does take her Novolog anyway  She thinks she is taking her insulin consistently, currently on Lantus once a at night     Oral hypoglycemic drugs: Metformin ER1500 mg         Side effects from medications:   occasional diarrhea from metformin Insulin regimen:  34 Lantus hs, Novolog insulin 18 units bid  Proper timing of medications in relation to meals: Yes, 30 min ac.          Monitors blood glucose:  once or twice a day     Glucometer:  generic now, was using One Touch Verio.          Blood Glucose readings by recall:  PRE-MEAL Fasting Lunch Dinner Bedtime Overall  Glucose range: 250-360   250-280   Mean/median:         Meals:  usually 2 meals per day, not eating lunch. Supper at 7 pm     Physical activity: exercise: Exercise bike , 3/7 days a week           Dietician visit: Most recent: 1998 ?     Weight control:  Wt Readings from Last 3 Encounters:  08/06/14 209 lb 6.4 oz (94.983 kg)  07/26/14 230 lb (104.327 kg)  06/20/14 216 lb 12.8 oz (98.34 kg)        Diabetes labs:  Lab Results  Component Value Date   HGBA1C 7.6* 02/04/2014   HGBA1C 7.0* 10/11/2013   HGBA1C 8.7* 05/11/2013   Lab Results  Component Value Date   MICROALBUR 6.9* 07/03/2013   LDLCALC 70 04/12/2014   CREATININE 1.07* 07/26/2014       Last eye  exam: 02/2014         Medication List       This list is accurate as of: 08/06/14 11:08 AM.  Always use your most recent med list.               albuterol 108 (90 BASE) MCG/ACT inhaler  Commonly known as:  PROVENTIL HFA;VENTOLIN HFA  Inhale 2 puffs into the lungs every 6 (six) hours as needed for wheezing or shortness of breath.     amLODipine 10 MG tablet  Commonly known as:  NORVASC  Take 1 tablet (10 mg total) by mouth daily.     aspirin 81 MG tablet  Take 81 mg by mouth daily.     cloNIDine 0.2 MG tablet  Commonly known as:  CATAPRES  Take 0.5 tablets (0.1 mg total) by mouth 2 (two) times daily.     ezetimibe 10 MG tablet  Commonly known as:  ZETIA  Take 1 tablet (10 mg total) by mouth daily.     fluticasone 50 MCG/ACT nasal spray  Commonly known as:  FLONASE  Place 1 spray into both nostrils daily as needed for allergies.      Fluticasone-Salmeterol 100-50 MCG/DOSE Aepb  Commonly known as:  ADVAIR  Inhale 1 puff into the lungs as needed.     glucose blood test strip  Commonly known as:  ONETOUCH VERIO  Use as instructed to check blood sugars 2 times per day, dx code 250.00     insulin aspart 100 UNIT/ML FlexPen  Commonly known as:  NOVOLOG FLEXPEN  Inject 15 Units into the skin 2 (two) times daily.     Insulin Glargine 100 UNIT/ML Solostar Pen  Commonly known as:  LANTUS  Inject 34 Units into the skin daily at 10 pm.     irbesartan-hydrochlorothiazide 300-12.5 MG per tablet  Commonly known as:  AVALIDE  Take 1 tablet by mouth daily.     Liraglutide 18 MG/3ML Sopn  Commonly known as:  VICTOZA  Inject 0.2 mLs (1.2 mg total) into the skin daily. Inject once daily at the same time     meloxicam 15 MG tablet  Commonly known as:  MOBIC  Take 15 mg by mouth 2 (two) times daily as needed for pain.     metFORMIN 1000 MG tablet  Commonly known as:  GLUCOPHAGE  Take 1 tablet (1,000 mg total) by mouth 2 (two) times daily with a meal.     metoprolol succinate 100 MG 24 hr tablet  Commonly known as:  TOPROL-XL  Take 1 tablet (100 mg total) by mouth daily. Take with or immediately following a meal.     omeprazole 20 MG capsule  Commonly known as:  PRILOSEC  Take 20 mg by mouth daily.     polyethylene glycol packet  Commonly known as:  MIRALAX / GLYCOLAX  Take 17 g by mouth daily as needed for moderate constipation.     REPATHA 140 MG/ML Sosy  Generic drug:  Evolocumab  Inject into the skin.     rosuvastatin 40 MG tablet  Commonly known as:  CRESTOR  Take 1 tablet (40 mg total) by mouth daily.     TYLENOL 8 HOUR ARTHRITIS PAIN 650 MG CR tablet  Generic drug:  acetaminophen  Take 650 mg by mouth every 8 (eight) hours as needed for pain.        Allergies:  Allergies  Allergen Reactions  . Ace Inhibitors Cough  . Shellfish Allergy Other (See Comments)  Pt cannot recall at this time    Past  Medical History  Diagnosis Date  . Hypertension   . Diabetes mellitus without complication   . ALLERGIC RHINITIS   . ASTHMA   . Esophageal reflux   . SINUSITIS, ACUTE     Past Surgical History  Procedure Laterality Date  . Abdominal hysterectomy    . Breast surgery      Family History  Problem Relation Age of Onset  . Diabetes Mother   . Heart attack Father   . Heart disease Father     Social History:  reports that she has never smoked. She does not have any smokeless tobacco history on file. She reports that she does not drink alcohol or use illicit drugs.  Review of Systems:  She complains of shortness of breath on exertion recently  Also complains of significant fatigue  Hypertension:  has inadequate control recently with amlodipine, clonidine and Avalide, followed by cardiologist and PCP  Renal function: Creatinine back to normal, Mobic was stopped.  Lab Results  Component Value Date   CREATININE 1.07* 07/26/2014     Lipids: Have not been controlled in the past. Does have a history of CAD and carotid stenosis also.  She says that she is taking her Crestor and Zetia every day  Surprisingly her LDL was only 70 back in March she will though it had been previously consistently high, indicating possibly better compliance  She has been approved for Repatha and she does have it now, pending instructions to start this  Lab Results  Component Value Date   CHOL 148 04/12/2014   HDL 43.70 04/12/2014   LDLCALC 70 04/12/2014   LDLDIRECT 189.5 10/11/2013   TRIG 171.0* 04/12/2014   CHOLHDL 3 04/12/2014    Neuropathy: She previously had a burning pain in her feet treated with gabapentin, no symptoms recently      Examination:   BP 158/88 mmHg  Pulse 58  Temp(Src) 97.7 F (36.5 C)  Resp 16  Ht 5\' 2"  (1.575 m)  Wt 209 lb 6.4 oz (94.983 kg)  BMI 38.29 kg/m2  SpO2 98%  Body mass index is 38.29 kg/(m^2).   She is not fully alert today at times  Diabetic foot  exam shows normal monofilament sensation in the toes and plantar surfaces, no skin lesions or ulcers on the feet and normal pedal pulses No edema  ASSESSMENT/ PLAN:    Diabetes type 2, uncontrolled:   Her blood sugars are markedly increased recently although not clear when the blood sugars started going up; she has not been seen regularly for follow-up She appears symptomatic from her markedly increased blood sugars recently Currently taking a total of about 70 units of insulin a along with Victoza and metformin Not clear why her sugars are higher, no history of steroid intake  Will check her urine to make sure she is not and ketosis For now will need to increase her insulin significantly and start taking additional dose of Lantus in the morning She will also be given additional insulin to take when her blood sugars are high as well as increase her Novolog to 3 times a day as outlined in patient instructions below Since she is having decreased appetite will have her hold off on Victoza and metformin Will see her back on Friday to reassess her level of control Discussed her regimen and insulin doses as well as blood sugar targets, her daughter was also present today for the discussion  Patient  Instructions  LANTUS 34 units and 20 in am  Keep Novolog same 3x daily but add more for highs:  > 200 add 6 units and >250 add 8 units and over 300 add 12 units  Stop Victoza and metfotmin         Lipids: She had a surprisingly good level of LDL in March even though it had previously been persistently high Suspected this is from her trying to improve her compliance with her oral medications  Although she has been approved for Repatha may not need this if her lipids are still well controlled with continued good compliance with her 40 mg Crestor and Zetia together  Hypertension: Blood pressure is still not controlled, she does need to follow-up with her cardiologist soon Also will need to  be evaluated for her dyspnea on exertion  Counseling time on subjects discussed above is over 50% of today's 25 minute visit   Zerrick Hanssen 08/06/2014, 11:08 AM   Note: This office note was prepared with Insurance underwriter. Any transcriptional errors that result from this process are unintentional.  Addendum: Urinalysis shows only trace ketones and no leukocytes

## 2014-08-06 NOTE — Progress Notes (Signed)
Quick Note:  Please let patient know that the kidney test is slightly worse, recommend taking only half tablet of the irbesartan but increase clonidine to the full tablet of 0.2 twice a day Cholesterol is higher again, confirm that she is taking Crestor and Zetia daily, will discuss on Friday  ______

## 2014-08-09 ENCOUNTER — Encounter: Payer: Self-pay | Admitting: Endocrinology

## 2014-08-09 ENCOUNTER — Ambulatory Visit (INDEPENDENT_AMBULATORY_CARE_PROVIDER_SITE_OTHER): Payer: Medicare Other | Admitting: Endocrinology

## 2014-08-09 VITALS — BP 202/100 | HR 60 | Temp 98.2°F | Resp 16 | Ht 62.0 in | Wt 210.8 lb

## 2014-08-09 DIAGNOSIS — E782 Mixed hyperlipidemia: Secondary | ICD-10-CM | POA: Diagnosis not present

## 2014-08-09 DIAGNOSIS — E1165 Type 2 diabetes mellitus with hyperglycemia: Secondary | ICD-10-CM

## 2014-08-09 DIAGNOSIS — I10 Essential (primary) hypertension: Secondary | ICD-10-CM | POA: Diagnosis not present

## 2014-08-09 DIAGNOSIS — IMO0002 Reserved for concepts with insufficient information to code with codable children: Secondary | ICD-10-CM

## 2014-08-09 MED ORDER — DOXAZOSIN MESYLATE 4 MG PO TABS: ORAL_TABLET | ORAL | Status: DC

## 2014-08-09 NOTE — Patient Instructions (Signed)
Clonidine 1/2 in am and 1 at nite  Full Avalide tab daily  Doxazosin 4 mg at bedtime  Novolog 3x daily, 22 units + extra for hi sugars and extra when not eating  Lantus 30 units on am dose, reduce both doses by 6 units when sugar <150  Repatha every 2 weeks

## 2014-08-09 NOTE — Progress Notes (Signed)
Patient ID: Heidi Campbell, female   DOB: 13-Sep-1945, 69 y.o.   MRN: [id number]   Reason for Appointment: Diabetes follow-up   History of Present Illness   Diagnosis: Type 2 DIABETES MELITUS, date of diagnosis: 1998      Previous history: She was initially treated with oral hypoglycemic drugs but switched to insulin when she was hospitalized for asthma in 2007. She has been on metformin but only 500 mg twice a day Over most of her diabetes history has been taking premixed insulin, NovoLog mix 70/30, twice a day Although her A1c in the past has been close to 7% she would tend to have hyperglycemia at 11 AM or overnight with his insulin She was given Victoza in 6/15 because of persistent poor control with her insulin regimen  Recent history:  She had been on a regimen of basal bolus insulin, Victoza and metformin She had not been seen in follow-up since 2/16 when her A1c was 7.6  Recently seen for marked increase in blood sugars, some over 500 Because of this her insulin dose was increased and Lantus added in the morning She has been taken off her Victoza and metformin because of her decreased appetite and impaired renal function  Current blood sugar patterns and problems identified:  She is checking her blood sugars only a couple of times a day instead of 4 times a day as requested  Her blood sugars are slowly improving  She was told to take Novolog up to 3 times a day with additional insulin for high readings but she is taking this only about twice  She is still complaining of feeling excessively thirsty although not as tired  Blood sugar this morning was 260 at home but has been over 300 also this week    Oral hypoglycemic drugs: Was on Metformin ER1500 mg         Side effects from medications:  occasional diarrhea from metformin Insulin regimen:  34 Lantus hs and 20 units in the morning, Novolog insulin 18 units bid + extra for high readings  Proper timing of  medications in relation to meals: Yes, 30 min ac.          Monitors blood glucose:  r twice a day     Glucometer:   [employer] psychotherapist.          Blood Glucose readings as above   Meals:  usually 2 meals per day, not eating lunch. Supper at 7 pm     Physical activity: exercise: None recently, was using Exercise bike , 3/7 days a week           Dietician visit: Most recent: 1998 ?     Weight control:  Wt Readings from Last 3 Encounters:  08/09/14 210 lb 12.8 oz (95.618 kg)  08/06/14 209 lb 6.4 oz (94.983 kg)  07/26/14 230 lb (104.327 kg)        Diabetes labs:  Lab Results  Component Value Date   HGBA1C 12.6* 08/06/2014   HGBA1C 7.6* 02/04/2014   HGBA1C 7.0* 10/11/2013   Lab Results  Component Value Date   MICROALBUR 6.9* 07/03/2013   LDLCALC 70 04/12/2014   CREATININE 1.53* 08/06/2014       Last eye exam: 02/2014        Medication List       This list is accurate as of: 08/09/14 12:35 PM.  Always use your most recent med list.  albuterol 108 (90 BASE) MCG/ACT inhaler  Commonly known as:  PROVENTIL HFA;VENTOLIN HFA  Inhale 2 puffs into the lungs every 6 (six) hours as needed for wheezing or shortness of breath.     amLODipine 10 MG tablet  Commonly known as:  NORVASC  Take 1 tablet (10 mg total) by mouth daily.     aspirin 81 MG tablet  Take 81 mg by mouth daily.     cloNIDine 0.2 MG tablet  Commonly known as:  CATAPRES  Take 0.5 tablets (0.1 mg total) by mouth 2 (two) times daily.     doxazosin 4 MG tablet  Commonly known as:  CARDURA  1/2 tab 1st 2 nights then 1 hs     ezetimibe 10 MG tablet  Commonly known as:  ZETIA  Take 1 tablet (10 mg total) by mouth daily.     fluticasone 50 MCG/ACT nasal spray  Commonly known as:  FLONASE  Place 1 spray into both nostrils daily as needed for allergies.     Fluticasone-Salmeterol 100-50 MCG/DOSE Aepb  Commonly known as:  ADVAIR  Inhale 1 puff into the lungs as needed.     glucose blood test  strip  Commonly known as:  ONETOUCH VERIO  Use as instructed to check blood sugars 2 times per day, dx code 250.00     insulin aspart 100 UNIT/ML FlexPen  Commonly known as:  NOVOLOG FLEXPEN  Inject 15 Units into the skin 2 (two) times daily.     Insulin Glargine 100 UNIT/ML Solostar Pen  Commonly known as:  LANTUS  Inject 34 Units into the skin daily at 10 pm.     irbesartan-hydrochlorothiazide 300-12.5 MG per tablet  Commonly known as:  AVALIDE  Take 1 tablet by mouth daily.     Liraglutide 18 MG/3ML Sopn  Commonly known as:  VICTOZA  Inject 0.2 mLs (1.2 mg total) into the skin daily. Inject once daily at the same time     metFORMIN 1000 MG tablet  Commonly known as:  GLUCOPHAGE  Take 1 tablet (1,000 mg total) by mouth 2 (two) times daily with a meal.     metoprolol succinate 100 MG 24 hr tablet  Commonly known as:  TOPROL-XL  Take 1 tablet (100 mg total) by mouth daily. Take with or immediately following a meal.     omeprazole 20 MG capsule  Commonly known as:  PRILOSEC  Take 20 mg by mouth daily.     polyethylene glycol packet  Commonly known as:  MIRALAX / GLYCOLAX  Take 17 g by mouth daily as needed for moderate constipation.     REPATHA 140 MG/ML Sosy  Generic drug:  Evolocumab  Inject into the skin.     rosuvastatin 40 MG tablet  Commonly known as:  CRESTOR  Take 1 tablet (40 mg total) by mouth daily.     TYLENOL 8 HOUR ARTHRITIS PAIN 650 MG CR tablet  Generic drug:  acetaminophen  Take 650 mg by mouth every 8 (eight) hours as needed for pain.        Allergies:  Allergies  Allergen Reactions  . Ace Inhibitors Cough  . Shellfish Allergy Other (See Comments)    Pt cannot recall at this time    Past Medical History  Diagnosis Date  . Hypertension   . Diabetes mellitus without complication   . ALLERGIC RHINITIS   . ASTHMA   . Esophageal reflux   . SINUSITIS, ACUTE     Past Surgical History  Procedure Laterality Date  . Abdominal hysterectomy     . Breast surgery      Family History  Problem Relation Age of Onset  . Diabetes Mother   . Heart attack Father   . Heart disease Father     Social History:  reports that she has never smoked. She does not have any smokeless tobacco history on file. She reports that she does not drink alcohol or use illicit drugs.  Review of Systems:  Hypertension:  has inadequate control recently with amlodipine, clonidine and Avalide, followed by cardiologist and PCP She thinks that clonidine makes her sleepy.  Currently taking 0.2 mg twice a day Has been compliant with her other medications, Avalide was reduced to half tablet this week because of increased creatinine  Renal function: Creatinine has been recently higher; Mobic was stopped previously.  Lab Results  Component Value Date   CREATININE 1.53* 08/06/2014     Lipids: Have not been controlled  Does have a history of CAD and carotid stenosis also.  She says that she is taking her Crestor and Zetia every day  Surprisingly her LDL was only 70 back in March she will though it had been previously consistently high It is now again high and she thinks that she has usually been very consistent with her medications and does not know why it was better in March  She has been approved for Repatha and she does have it now, pending instructions to start this  Lab Results  Component Value Date   CHOL 209* 08/06/2014   HDL 44.00 08/06/2014   LDLCALC 70 04/12/2014   LDLDIRECT 125.0 08/06/2014   TRIG 217.0* 08/06/2014   CHOLHDL 5 08/06/2014    Neuropathy: She previously had a burning pain in her feet treated with gabapentin, no symptoms recently   Foot exam in 7/16   Examination:   BP 202/100 mmHg  Pulse 60  Temp(Src) 98.2 F (36.8 C)  Resp 16  Ht  (1.575 m)  Wt 210 lb 12.8 oz (95.618 kg)  BMI 38.55 kg/m2  SpO2 98%  Body mass index is 38.55 kg/(m^2).   Repeat blood pressure 170/78  No edema present  ASSESSMENT/ PLAN:     Diabetes type 2, uncontrolled:   Her blood sugars have been markedly increased and not clear why she has not changed her regimen, compliance with her treatment program or diet With increasing her Lantus insulin and taking additional Novolog for high readings her blood sugars are now in the 200+ range  She is still symptomatic with increased thirst  For now will increase her Lantus further in the morning and also Novolog by 6 units at meals She will take additional Novolog the third time in the evenings if blood sugar is still high on her correction scale  HYPERTENSION: This is poorly controlled despite multiple medications Although she may be a candidate for adding Aldactone we will not do this while her renal function is impaired For now will reduce her clonidine to half tablet in the morning cause of daytime somnolence and add Cardura 2-4 mg at night She will be seen by cardiologist next week  Lipids: She has significantly high levels and she reports  continued good compliance with her 40 mg Crestor and Zetia together She will be instructed on the Repatha injection by the company home nurse and she will start next week   Patient Instructions  Clonidine 1/2 in am and 1 at nite  Full Avalide tab daily  Doxazosin 4 mg at bedtime  Novolog 3x daily, 22 units + extra for hi sugars and extra when not eating  Lantus 30 units on am dose, reduce both doses by 6 units when sugar <150  Repatha every 2 weeks       Katrece Roediger 08/09/2014, 12:35 PM   Note: This office note was prepared with Insurance underwriter. Any transcriptional errors that result from this process are unintentional.

## 2014-08-15 ENCOUNTER — Ambulatory Visit (INDEPENDENT_AMBULATORY_CARE_PROVIDER_SITE_OTHER): Payer: Medicare Other | Admitting: Interventional Cardiology

## 2014-08-15 ENCOUNTER — Encounter: Payer: Self-pay | Admitting: Interventional Cardiology

## 2014-08-15 VITALS — BP 148/60 | HR 63 | Ht 62.0 in | Wt 219.4 lb

## 2014-08-15 DIAGNOSIS — I10 Essential (primary) hypertension: Secondary | ICD-10-CM

## 2014-08-15 DIAGNOSIS — R5383 Other fatigue: Secondary | ICD-10-CM | POA: Diagnosis not present

## 2014-08-15 NOTE — Patient Instructions (Signed)
Medication Instructions:  Same-no change  Labwork: None  Testing/Procedures: None  Follow-Up: Your physician recommends that you schedule a follow-up appointment in: 2 weeks for BP check with Pharm D or RN. Please bring your blood pressure cuff with you to visit.

## 2014-08-15 NOTE — Progress Notes (Signed)
Patient ID: Heidi Campbell, female   DOB: 04-Dec-1945, 69 y.o.   MRN: [id number]     Cardiology Office Note   Date:  08/15/2014   ID:  Heidi Campbell, DOB 07-16-1945, MRN [id number]  PCP:  Astrid Divine, MD    Chief Complaint  Patient presents with  . Follow-up    carotid stenosis, left     Wt Readings from Last 3 Encounters:  08/15/14 219 lb 6.4 oz (99.519 kg)  08/09/14 210 lb 12.8 oz (95.618 kg)  08/06/14 209 lb 6.4 oz (94.983 kg)       History of Present Illness: Heidi Campbell is a 69 y.o. female  who has had medically managed CAD, from cath 06/25/2010. Her husband passed away from prostate CA in 06/24/12. SHe has been checking her BP at home. Readings still high at times. Blood sugar is high. She uses a stationary bike for PT. She does 15 minutes 3x/day. Limited by both DOE and hip pain. She is fatigued from the clonidine. CAD/ASCVD:  Denies : Chest pain.  Diet.  Dyspnea on exertion.  Fatigue.   c/o Leg edema. Right foot, intermittent   Over the past few weeks, BP has been controled, Now using clonidine BID. BP was much better controlled, but readings lately are higher. No bleeding problems.  Started on Cardura 3 days ago. She is drowsy , she thinks from the clonidine.    Past Medical History  Diagnosis Date  . Hypertension   . Diabetes mellitus without complication   . ALLERGIC RHINITIS   . ASTHMA   . Esophageal reflux   . SINUSITIS, ACUTE     Past Surgical History  Procedure Laterality Date  . Abdominal hysterectomy    . Breast surgery       Current Outpatient Prescriptions  Medication Sig Dispense Refill  . acetaminophen (TYLENOL 8 HOUR ARTHRITIS PAIN) 650 MG CR tablet Take 650 mg by mouth every 8 (eight) hours as needed for pain.    Marland Kitchen albuterol (PROVENTIL HFA;VENTOLIN HFA) 108 (90 BASE) MCG/ACT inhaler Inhale 2 puffs into the lungs every 6 (six) hours as needed for wheezing or shortness of breath.    Marland Kitchen amLODipine (NORVASC) 10 MG tablet Take 1  tablet (10 mg total) by mouth daily. 30 tablet 1  . aspirin 81 MG tablet Take 81 mg by mouth daily.    . cloNIDine (CATAPRES) 0.2 MG tablet Take 0.5 tablets (0.1 mg total) by mouth 2 (two) times daily.    Marland Kitchen doxazosin (CARDURA) 4 MG tablet 1/2 tab 1st 2 nights then 1 hs 30 tablet 2  . Evolocumab (REPATHA) 140 MG/ML SOSY Inject into the skin.    Marland Kitchen ezetimibe (ZETIA) 10 MG tablet Take 1 tablet (10 mg total) by mouth daily. 30 tablet 3  . fluticasone (FLONASE) 50 MCG/ACT nasal spray Place 1 spray into both nostrils daily as needed for allergies.     . Fluticasone-Salmeterol (ADVAIR) 100-50 MCG/DOSE AEPB Inhale 1 puff into the lungs as needed.    Marland Kitchen glucose blood (ONETOUCH VERIO) test strip Use as instructed to check blood sugars 2 times per day, dx code 250.00 100 each 5  . insulin aspart (NOVOLOG FLEXPEN) 100 UNIT/ML FlexPen Inject 15 Units into the skin 2 (two) times daily. 15 mL 2  . Insulin Glargine (LANTUS) 100 UNIT/ML Solostar Pen Inject 34 Units into the skin daily at 10 pm. 15 mL 2  . irbesartan-hydrochlorothiazide (AVALIDE) 300-12.5 MG per tablet Take 1 tablet by mouth daily.    Marland Kitchen  Liraglutide (VICTOZA) 18 MG/3ML SOPN Inject 0.2 mLs (1.2 mg total) into the skin daily. Inject once daily at the same time 6 mL 2  . metFORMIN (GLUCOPHAGE) 1000 MG tablet Take 1 tablet (1,000 mg total) by mouth 2 (two) times daily with a meal. 60 tablet 2  . metoprolol succinate (TOPROL-XL) 100 MG 24 hr tablet Take 1 tablet (100 mg total) by mouth daily. Take with or immediately following a meal. 90 tablet 3  . omeprazole (PRILOSEC) 20 MG capsule Take 20 mg by mouth daily.    . polyethylene glycol (MIRALAX / GLYCOLAX) packet Take 17 g by mouth daily as needed for moderate constipation.     . rosuvastatin (CRESTOR) 40 MG tablet Take 1 tablet (40 mg total) by mouth daily. 30 tablet 6   No current facility-administered medications for this visit.    Allergies:   Ace inhibitors and Shellfish allergy    Social  History:  The patient  reports that she has never smoked. She does not have any smokeless tobacco history on file. She reports that she does not drink alcohol or use illicit drugs.   Family History:  The patient's *family history includes Diabetes in her mother; Heart attack in her father; Heart disease in her father.    ROS:  Please see the history of present illness.   Otherwise, review of systems are positive for fatigue, headaches.   All other systems are reviewed and negative.    PHYSICAL EXAM: VS:  BP 148/60 mmHg  Pulse 63  Ht 5\' 2"  (1.575 m)  Wt 219 lb 6.4 oz (99.519 kg)  BMI 40.12 kg/m2  SpO2 98% , BMI Body mass index is 40.12 kg/(m^2). GEN: Well nourished, well developed, in no acute distress HEENT: normal Neck: no JVD, carotid bruits, or masses Cardiac: RRR; no murmurs, rubs, or gallops,no edema  Respiratory:  clear to auscultation bilaterally, normal work of breathing GI: soft, nontender, nondistended, + BS MS: no deformity or atrophy Skin: warm and dry, no rash Neuro:  Strength and sensation are intact Psych: euthymic mood, full affect      Recent Labs: 07/26/2014: ALT 27; Hemoglobin 14.1; Platelets 214 08/06/2014: BUN 14; Creatinine, Ser 1.53*; Potassium 3.8; Sodium 133*   Lipid Panel    Component Value Date/Time   CHOL 209* 08/06/2014 1049   TRIG 217.0* 08/06/2014 1049   HDL 44.00 08/06/2014 1049   CHOLHDL 5 08/06/2014 1049   VLDL 43.4* 08/06/2014 1049   LDLCALC 70 04/12/2014 1030   LDLDIRECT 125.0 08/06/2014 1049        ASSESSMENT AND PLAN:  1.  hypertension: I've asked her to come back with her blood pressure cuff to be checked in the hypertension clinic. Cardura was just started. We'll see how she does on the higher dose. If there is no improvement with Cardura , I would add hydralazine 10 mg 3 times a day. I would quickly increase to 25 mg 3 times a day.  2. Fatigue: She has side effects of fatigue which are likely related to clonidine. 3. BP better  in the office than on home readings.  Her cuff needs to be checked.  SHe also needs some dietary counseling as well.    Current medicines are reviewed at length with the patient today.  The patient concerns regarding her medicines were addressed.  The following changes have been made:  As above  Labs/ tests ordered today include No orders of the defined types were placed in this encounter.  Recommend 150 minutes/week of aerobic exercise Low fat, low carb, high fiber diet recommended  Disposition:   FU in 2 weeks   Delorise JacksonSigned, Edeline Greening S., MD  08/15/2014 10:56 AM    [facility] [address], [place], [place]  [id number] Phone: [phone]; Fax: [phone]

## 2014-08-19 ENCOUNTER — Other Ambulatory Visit: Payer: Self-pay | Admitting: *Deleted

## 2014-08-19 MED ORDER — INSULIN GLARGINE 100 UNIT/ML SOLOSTAR PEN: 34.0000 [IU] | PEN_INJECTOR | Freq: Every day | SUBCUTANEOUS | Status: DC

## 2014-08-26 ENCOUNTER — Ambulatory Visit: Payer: Medicare Other | Admitting: Podiatry

## 2014-08-27 ENCOUNTER — Ambulatory Visit: Payer: Medicare Other | Admitting: Podiatry

## 2014-08-27 ENCOUNTER — Other Ambulatory Visit (INDEPENDENT_AMBULATORY_CARE_PROVIDER_SITE_OTHER): Payer: Medicare Other

## 2014-08-27 DIAGNOSIS — E1165 Type 2 diabetes mellitus with hyperglycemia: Secondary | ICD-10-CM | POA: Diagnosis not present

## 2014-08-27 DIAGNOSIS — IMO0002 Reserved for concepts with insufficient information to code with codable children: Secondary | ICD-10-CM

## 2014-08-27 LAB — COMPREHENSIVE METABOLIC PANEL
ALBUMIN: 3.8 g/dL (ref 3.5–5.2)
ALT: 16 U/L (ref 0–35)
AST: 23 U/L (ref 0–37)
Alkaline Phosphatase: 66 U/L (ref 39–117)
BILIRUBIN TOTAL: 0.6 mg/dL (ref 0.2–1.2)
BUN: 12 mg/dL (ref 6–23)
CALCIUM: 9.5 mg/dL (ref 8.4–10.5)
CO2: 29 mEq/L (ref 19–32)
Chloride: 105 mEq/L (ref 96–112)
Creatinine, Ser: 0.94 mg/dL (ref 0.40–1.20)
GFR: 75.84 mL/min (ref >60.00)
Glucose, Bld: 111 mg/dL — ABNORMAL HIGH (ref 70–99)
Potassium: 3.9 mEq/L (ref 3.5–5.1)
Sodium: 141 mEq/L (ref 135–145)
Total Protein: 7 g/dL (ref 6.0–8.3)

## 2014-08-28 ENCOUNTER — Encounter: Payer: Self-pay | Admitting: Podiatry

## 2014-08-28 ENCOUNTER — Ambulatory Visit (INDEPENDENT_AMBULATORY_CARE_PROVIDER_SITE_OTHER): Payer: Medicare Other | Admitting: Podiatry

## 2014-08-28 VITALS — BP 161/74 | HR 52 | Resp 15

## 2014-08-28 DIAGNOSIS — L84 Corns and callosities: Secondary | ICD-10-CM | POA: Diagnosis not present

## 2014-08-28 DIAGNOSIS — M79676 Pain in unspecified toe(s): Secondary | ICD-10-CM

## 2014-08-28 DIAGNOSIS — B351 Tinea unguium: Secondary | ICD-10-CM | POA: Diagnosis not present

## 2014-08-28 NOTE — Patient Instructions (Signed)
Continue to manage her high blood pressure with your medical doctor Try a knee length compression hose light grade (8-15 mmHg pressure) Apply in the morning and remove at bedtime  Diabetes and Foot Care Diabetes may cause you to have problems because of poor blood supply (circulation) to your feet and legs. This may cause the skin on your feet to become thinner, break easier, and heal more slowly. Your skin may become dry, and the skin may peel and crack. You may also have nerve damage in your legs and feet causing decreased feeling in them. You may not notice minor injuries to your feet that could lead to infections or more serious problems. Taking care of your feet is one of the most important things you can do for yourself.  HOME CARE INSTRUCTIONS  Wear shoes at all times, even in the house. Do not go barefoot. Bare feet are easily injured.  Check your feet daily for blisters, cuts, and redness. If you cannot see the bottom of your feet, use a mirror or ask someone for help.  Wash your feet with warm water (do not use hot water) and mild soap. Then pat your feet and the areas between your toes until they are completely dry. Do not soak your feet as this can dry your skin.  Apply a moisturizing lotion or petroleum jelly (that does not contain alcohol and is unscented) to the skin on your feet and to dry, brittle toenails. Do not apply lotion between your toes.  Trim your toenails straight across. Do not dig under them or around the cuticle. File the edges of your nails with an emery board or nail file.  Do not cut corns or calluses or try to remove them with medicine.  Wear clean socks or stockings every day. Make sure they are not too tight. Do not wear knee-high stockings since they may decrease blood flow to your legs.  Wear shoes that fit properly and have enough cushioning. To break in new shoes, wear them for just a few hours a day. This prevents you from injuring your feet. Always look  in your shoes before you put them on to be sure there are no objects inside.  Do not cross your legs. This may decrease the blood flow to your feet.  If you find a minor scrape, cut, or break in the skin on your feet, keep it and the skin around it clean and dry. These areas may be cleansed with mild soap and water. Do not cleanse the area with peroxide, alcohol, or iodine.  When you remove an adhesive bandage, be sure not to damage the skin around it.  If you have a wound, look at it several times a day to make sure it is healing.  Do not use heating pads or hot water bottles. They may burn your skin. If you have lost feeling in your feet or legs, you may not know it is happening until it is too late.  Make sure your health care provider performs a complete foot exam at least annually or more often if you have foot problems. Report any cuts, sores, or bruises to your health care provider immediately. SEEK MEDICAL CARE IF:   You have an injury that is not healing.  You have cuts or breaks in the skin.  You have an ingrown nail.  You notice redness on your legs or feet.  You feel burning or tingling in your legs or feet.  You have pain  or cramps in your legs and feet.  Your legs or feet are numb.  Your feet always feel cold. SEEK IMMEDIATE MEDICAL CARE IF:   There is increasing redness, swelling, or pain in or around a wound.  There is a red line that goes up your leg.  Pus is coming from a wound.  You develop a fever or as directed by your health care provider.  You notice a bad smell coming from an ulcer or wound. Document Released: 01/16/2000 Document Revised: 09/20/2012 Document Reviewed: 06/27/2012 [facility] Patient Information 2015 [place], [place]. This information is not intended to replace advice given to you by your health care provider. Make sure you discuss any questions you have with your health care provider.

## 2014-08-29 NOTE — Progress Notes (Signed)
Patient ID: Heidi Campbell, female   DOB: December 13, 1945, 69 y.o.   MRN: [id number]  Subjective: This patient presents for scheduled visit complaining of painful toenails and a painful keratoses Patient says that she has swelling and hypertension under management of her medical doctor  Objective: Eating edema bilaterally The toenails are hypertrophic, elongated, discolored, incurvated and tender to palpation 6-10 Keratoses fifth left toe  Assessment: Are full edema bilaterally Symptomatic onychomycoses 6-10 Type II diabetic  Plan: Debridement toenails 10 and keratoses 1 without any bleeding Ingested knee length compression hose to help control edema bilaterally and maintain management of hypertension with her medical doctor  Reappoint 3 months

## 2014-08-30 ENCOUNTER — Ambulatory Visit (INDEPENDENT_AMBULATORY_CARE_PROVIDER_SITE_OTHER): Payer: Medicare Other | Admitting: Endocrinology

## 2014-08-30 ENCOUNTER — Ambulatory Visit (INDEPENDENT_AMBULATORY_CARE_PROVIDER_SITE_OTHER): Payer: Medicare Other | Admitting: Pharmacist

## 2014-08-30 ENCOUNTER — Encounter: Payer: Self-pay | Admitting: Endocrinology

## 2014-08-30 VITALS — BP 148/70 | HR 72 | Temp 98.3°F | Resp 16 | Ht 62.0 in | Wt 222.8 lb

## 2014-08-30 VITALS — BP 178/78 | HR 58 | Wt 222.8 lb

## 2014-08-30 DIAGNOSIS — E782 Mixed hyperlipidemia: Secondary | ICD-10-CM | POA: Diagnosis not present

## 2014-08-30 DIAGNOSIS — IMO0002 Reserved for concepts with insufficient information to code with codable children: Secondary | ICD-10-CM

## 2014-08-30 DIAGNOSIS — I10 Essential (primary) hypertension: Secondary | ICD-10-CM

## 2014-08-30 DIAGNOSIS — E1165 Type 2 diabetes mellitus with hyperglycemia: Secondary | ICD-10-CM | POA: Diagnosis not present

## 2014-08-30 MED ORDER — POTASSIUM CHLORIDE ER 10 MEQ PO TBCR: 10.0000 meq | EXTENDED_RELEASE_TABLET | Freq: Every day | ORAL | Status: DC

## 2014-08-30 MED ORDER — FUROSEMIDE 20 MG PO TABS: 20.0000 mg | ORAL_TABLET | Freq: Every day | ORAL | Status: DC

## 2014-08-30 NOTE — Patient Instructions (Signed)
Lantus 32 units daily  Novolog at lunch 18 and 22 at Bfst and supper  Metformin 1, twice daily  Repatha 1 shot every 2 weeks  Lasix  daily

## 2014-08-30 NOTE — Patient Instructions (Signed)
Your blood pressure was elevated today but since Dr. Lucianne Muss added a new one this morning.   We will recheck your blood pressure in 2 weeks and check your labs at the same time.

## 2014-08-30 NOTE — Progress Notes (Signed)
Pharmacist Hypertension Clinic   S/O: Heidi Campbell is a 69 y.o. female presenting to clinic today for her initial visit and referred to hypertension clinic by Dr. Eldridge Dace.  She has a PMH significant for DM, CAD, HTN.  Her BP was fairly well controlled but over the past year it has been elevated.  There were no major differences in her medications over the past year.  She went to the ED on 6/24 for elevated BP.  Her BP was 268/113 and amlodipine 10mg  daily was added.  It was still elevated at 202/100 on 7/8 and Cardura was added.  Her BP on 7/14 had improved to 148/60.  Her renal function has also been elevated and so this has limited her ability to start an aldosterone antagonist.  She was seen by Dr. Lucianne Muss this AM.  She has had lower extremity swelling for some time so he added furosemide 20mg  daily to her regimen.    Current HTN meds: Morning: Amlodipine 10mg  Irbesartan/HCTZ 300/12.5mg  Furosemide 20mg  (added today - has not had dose yet)  Evening: Clonidine 0.2mg  Doxazosin 4mg   FH: Father had died from MI at 15.  Mother developed DM in her 55s.  SH: non smoker, no alcohol, no illecit drug use  Dietary History:  Pt states she got sick at the beginning of June and has not had a taste for food over the past 6 weeks.  She has just been able to get back to her normal diet this week. Yesterday, she had oatmeal with cinnamon, toast and coffee for breakfast.  Lunch consisted of grilled chicken salad and water.  Dinner was fish and hushpuppies and water.    She typically drinks water or caffeine free drinks during the day. She does have a cup of coffee in the AM.  She does not add salt to her food.   Exercise:  Was riding her exercise bike 1-2 times a week but has not done this since June.   Wt Readings from Last 3 Encounters:  08/30/14 222 lb 12 oz (101.039 kg)  08/30/14 222 lb 12.8 oz (101.061 kg)  08/15/14 219 lb 6.4 oz (99.519 kg)   BP Readings from Last 3 Encounters:  08/30/14  178/78  08/30/14 148/70  08/28/14 161/74   Pulse Readings from Last 3 Encounters:  08/30/14 58  08/30/14 72  08/28/14 52    Renal function: Estimated Creatinine Clearance: 62.9 mL/min (by C-G formula based on Cr of 0.94).  Outpatient Encounter Prescriptions as of 08/30/2014  Medication Sig  . cloNIDine (CATAPRES) 0.2 MG tablet Take 0.2 mg by mouth at bedtime.  Marland Kitchen doxazosin (CARDURA) 4 MG tablet Take 4 mg by mouth at bedtime.  . irbesartan-hydrochlorothiazide (AVALIDE) 300-12.5 MG per tablet Take 0.5 tablets by mouth daily.  Marland Kitchen acetaminophen (TYLENOL 8 HOUR ARTHRITIS PAIN) 650 MG CR tablet Take 650 mg by mouth every 8 (eight) hours as needed for pain.  Marland Kitchen albuterol (PROVENTIL HFA;VENTOLIN HFA) 108 (90 BASE) MCG/ACT inhaler Inhale 2 puffs into the lungs every 6 (six) hours as needed for wheezing or shortness of breath.  Marland Kitchen amLODipine (NORVASC) 10 MG tablet Take 1 tablet (10 mg total) by mouth daily.  Marland Kitchen aspirin 81 MG tablet Take 81 mg by mouth daily.  . Evolocumab (REPATHA) 140 MG/ML SOSY Inject 140 mg into the skin every 14 (fourteen) days.   Marland Kitchen ezetimibe (ZETIA) 10 MG tablet Take 1 tablet (10 mg total) by mouth daily.  . fluticasone (FLONASE) 50 MCG/ACT nasal spray  Place 1 spray into both nostrils daily as needed for allergies.   . Fluticasone-Salmeterol (ADVAIR) 100-50 MCG/DOSE AEPB Inhale 1 puff into the lungs as needed.  . furosemide (LASIX) 20 MG tablet Take 1 tablet (20 mg total) by mouth daily.  Marland Kitchen glucose blood (ONETOUCH VERIO) test strip Use as instructed to check blood sugars 2 times per day, dx code 250.00  . insulin aspart (NOVOLOG FLEXPEN) 100 UNIT/ML FlexPen Inject 15 Units into the skin 2 (two) times daily. (Patient taking differently: Inject 15 Units into the skin 2 (two) times daily. ss)  . Insulin Glargine (LANTUS) 100 UNIT/ML Solostar Pen Inject 34 Units into the skin daily at 10 pm.  . Liraglutide (VICTOZA) 18 MG/3ML SOPN Inject 0.2 mLs (1.2 mg total) into the skin daily.  Inject once daily at the same time  . metFORMIN (GLUCOPHAGE) 1000 MG tablet Take 1 tablet (1,000 mg total) by mouth 2 (two) times daily with a meal.  . omeprazole (PRILOSEC) 20 MG capsule Take 20 mg by mouth daily.  . polyethylene glycol (MIRALAX / GLYCOLAX) packet Take 17 g by mouth daily as needed for moderate constipation.   . potassium chloride (KLOR-CON 10) 10 MEQ tablet Take 1 tablet (10 mEq total) by mouth daily.  . rosuvastatin (CRESTOR) 40 MG tablet Take 1 tablet (40 mg total) by mouth daily.  . [DISCONTINUED] cloNIDine (CATAPRES) 0.2 MG tablet Take 0.5 tablets (0.1 mg total) by mouth 2 (two) times daily.  . [DISCONTINUED] doxazosin (CARDURA) 4 MG tablet 1/2 tab 1st 2 nights then 1 hs  . [DISCONTINUED] irbesartan-hydrochlorothiazide (AVALIDE) 300-12.5 MG per tablet Take 1 tablet by mouth daily.  . [DISCONTINUED] metoprolol succinate (TOPROL-XL) 100 MG 24 hr tablet Take 1 tablet (100 mg total) by mouth daily. Take with or immediately following a meal.   No facility-administered encounter medications on file as of 08/30/2014.    Allergies: Allergies  Allergen Reactions  . Ace Inhibitors Cough  . Shellfish Allergy Other (See Comments)    Pt cannot recall at this time    A/P:  Pt is not at BP goal of < 130/90 mmHg on amlodipine , clonidine 0.1mg , irbesartan/HCTZ 300/12.5 and doxazosin . Was seen by Dr. Lucianne Muss this AM who added furosemide to BP regimen due to some lower leg swelling. Because changes were already made today, will not make any other changes at this time. Patient planning on buying new BP cuff today. Patient asked to monitor BPs daily at home and keep a log. Patient asked to bring log and new BP cuff to next visit. Follow-up with BP check and labs in 3 weeks.

## 2014-08-30 NOTE — Progress Notes (Signed)
Patient ID: Heidi Campbell, female   DOB: [birth year], 69 y.o.   MRN: [id number]   Reason for Appointment: Diabetes follow-up   History of Present Illness   Diagnosis: Type 2 DIABETES MELITUS, date of diagnosis: 1998      Previous history: She was initially treated with oral hypoglycemic drugs but switched to insulin when she was hospitalized for asthma in 2007. She has been on metformin but only 500 mg twice a day Over most of her diabetes history has been taking premixed insulin, NovoLog mix 70/30, twice a day Although her A1c in the past has been close to 7% she would tend to have hyperglycemia at 11 AM or overnight with his insulin She was given Victoza in 6/15 because of persistent poor control with her insulin regimen  Recent history:   Insulin regimen:  34 Lantus at bedtime and 20 units in a.m. Novolog 3x daily, 22 units + extra for high readings  She had not been seen in follow-up since 2/16 when her A1c was 7.6; because of markedly increased blood sugars earlier this month additional Lantus was started in the morning and her Novolog was increased  She has been taken off her Victoza and metformin because of her decreased appetite and impaired renal function  Current blood sugar patterns and problems identified:  She is checking her blood sugars only once or twice a day and not consistently  Recently her fasting blood sugars are much better and fairly consistent  She does not know why she sporadically has readings over 200 in the evenings sometimes  She is compliant with taking her Novolog before eating  Yesterday before supper time are blood sugar got low at 66 and she felt symptomatic  Her appetite is somewhat better    Oral hypoglycemic drugs: None, Was on Metformin ER1500 mg         Side effects from medications:  occasional diarrhea from metformin  Proper timing of medications in relation to meals: Yes, 30 min ac.          Monitors blood glucose:   twice  a day     Glucometer:   [employer] psychotherap[employer].          Blood Glucose readings as above   Meals:  usually 2 meals per day, not eating lunch. Supper at 7 pm     Physical activity: exercise: None recently, was using Exercise bike , 3/7 days a week           Dietician visit: Most recent: 1998 ?     Weight control:  Wt Readings from Last 3 Encounters:  08/30/14 222 lb 12 oz (101.039 kg)  08/30/14 222 lb 12.8 oz (101.061 kg)  08/15/14 219 lb 6.4 oz (99.519 kg)        Diabetes labs:  Lab Results  Component Value Date   HGBA1C 12.6* 08/06/2014   HGBA1C 7.6* 02/04/2014   HGBA1C 7.0* 10/11/2013   Lab Results  Component Value Date   MICROALBUR 6.9* 07/03/2013   LDLCALC 70 04/12/2014   CREATININE 0.94 08/27/2014      HYPERTENSION and hypercholesterolemia: Sees Review of systems      Medication List       This list is accurate as of: 08/30/14 12:58 PM.  Always use your most recent med list.               albuterol 108 (90 BASE) MCG/ACT inhaler  Commonly known as:  PROVENTIL HFA;VENTOLIN HFA  Inhale 2 puffs into the lungs every 6 (six) hours as needed for wheezing or shortness of breath.     amLODipine 10 MG tablet  Commonly known as:  NORVASC  Take 1 tablet (10 mg total) by mouth daily.     aspirin 81 MG tablet  Take 81 mg by mouth daily.     cloNIDine 0.2 MG tablet  Commonly known as:  CATAPRES  Take 0.2 mg by mouth at bedtime.     doxazosin 4 MG tablet  Commonly known as:  CARDURA  Take 4 mg by mouth at bedtime.     ezetimibe 10 MG tablet  Commonly known as:  ZETIA  Take 1 tablet (10 mg total) by mouth daily.     fluticasone 50 MCG/ACT nasal spray  Commonly known as:  FLONASE  Place 1 spray into both nostrils daily as needed for allergies.     Fluticasone-Salmeterol 100-50 MCG/DOSE Aepb  Commonly known as:  ADVAIR  Inhale 1 puff into the lungs as needed.     furosemide 20 MG tablet  Commonly known as:  LASIX  Take 1 tablet (20 mg total) by mouth daily.       glucose blood test strip  Commonly known as:  ONETOUCH VERIO  Use as instructed to check blood sugars 2 times per day, dx code 250.00     insulin aspart 100 UNIT/ML FlexPen  Commonly known as:  NOVOLOG FLEXPEN  Inject 15 Units into the skin 2 (two) times daily.     Insulin Glargine 100 UNIT/ML Solostar Pen  Commonly known as:  LANTUS  Inject 34 Units into the skin daily at 10 pm.     irbesartan-hydrochlorothiazide 300-12.5 MG per tablet  Commonly known as:  AVALIDE  Take 0.5 tablets by mouth daily.     Liraglutide 18 MG/3ML Sopn  Commonly known as:  VICTOZA  Inject 0.2 mLs (1.2 mg total) into the skin daily. Inject once daily at the same time     metFORMIN 1000 MG tablet  Commonly known as:  GLUCOPHAGE  Take 1 tablet (1,000 mg total) by mouth 2 (two) times daily with a meal.     omeprazole 20 MG capsule  Commonly known as:  PRILOSEC  Take 20 mg by mouth daily.     polyethylene glycol packet  Commonly known as:  MIRALAX / GLYCOLAX  Take 17 g by mouth daily as needed for moderate constipation.     potassium chloride 10 MEQ tablet  Commonly known as:  KLOR-CON 10  Take 1 tablet (10 mEq total) by mouth daily.     REPATHA 140 MG/ML Sosy  Generic drug:  Evolocumab  Inject 140 mg into the skin every 14 (fourteen) days.     rosuvastatin 40 MG tablet  Commonly known as:  CRESTOR  Take 1 tablet (40 mg total) by mouth daily.     TYLENOL 8 HOUR ARTHRITIS PAIN 650 MG CR tablet  Generic drug:  acetaminophen  Take 650 mg by mouth every 8 (eight) hours as needed for pain.        Allergies:  Allergies  Allergen Reactions  . Ace Inhibitors Cough  . Shellfish Allergy Other (See Comments)    Pt cannot recall at this time    Past Medical History  Diagnosis Date  . Hypertension   . Diabetes mellitus without complication   . ALLERGIC RHINITIS   . ASTHMA   . Esophageal reflux   . SINUSITIS, ACUTE     Past  Surgical History  Procedure Laterality Date  . Abdominal  hysterectomy    . Breast surgery      Family History  Problem Relation Age of Onset  . Diabetes Mother   . Heart attack Father   . Heart disease Father     Social History:  reports that she has never smoked. She does not have any smokeless tobacco history on file. She reports that she does not drink alcohol or use illicit drugs.  Review of Systems:  Hypertension:  has inadequate control recently with amlodipine, clonidine and Avalide, followed by cardiologist and PCP On her last visit because of persistently high blood pressure readings are Avalide was increased back to the full tablet Also Cardura was added at night and morning clonidine was cut in half Her cardiologist suggested possibly adding hydralazine  Edema: She is starting to have swelling her legs now   Renal function: Creatinine had been recently higher; Mobic was stopped previously. Creatinine is now back to normal  Lab Results  Component Value Date   CREATININE 0.94 08/27/2014     Lipids: Have not been controlled  Does have a history of CAD and carotid stenosis also.  She says that she is taking her Crestor and Zetia every day  Surprisingly her LDL was only 70 back in March she will though it had been previously consistently high It is now again high and she thinks that she has usually been very consistent with her oral medications   She has been approved for Repatha and she does have it now, currently does not know how to use it  Lab Results  Component Value Date   CHOL 209* 08/06/2014   HDL 44.00 08/06/2014   LDLCALC 70 04/12/2014   LDLDIRECT 125.0 08/06/2014   TRIG 217.0* 08/06/2014   CHOLHDL 5 08/06/2014    Neuropathy: She previously had  burning pain in her feet treated with gabapentin, no symptoms recently   Foot exam in 7/16   Examination:   BP 148/70 mmHg  Pulse 72  Temp(Src) 98.3 F (36.8 C)  Resp 16  Ht 5\' 2"  (1.575 m)  Wt 222 lb 12.8 oz (101.061 kg)  BMI 40.74 kg/m2  SpO2 97%   Body mass index is 40.74 kg/(m^2).   2+ pedal edema present  ASSESSMENT/ PLAN:    Diabetes type 2, uncontrolled:   Her blood sugars have been progressively improving with increasing her insulin despite her not taking metformin Again not clear why her blood sugars had increased earlier this year Her blood sugars are reasonably well controlled with occasional high readings after supper For now will reduce her Novolog at lunch but no other changes to be made Does need to check blood sugars more consistently at various times Encouraged her to start being more active also She will restart metformin to 500 mg twice a day since creatinine is back to normal  Blurred vision: Likely to be from marked change in blood sugar levels  HYPERTENSION: This is improving consistently now and renal function is also better Since she has significant edema now will add Lasix 20 mg daily along with potassium  EDEMA: This may be related to improvement in her dehydration and increasing insulin doses  Lipids: She has significantly high levels and she reports  continued good compliance with her 40 mg Crestor and Zetia together She was instructed on the Repatha injection technique today and she will start taking every 2 weeks   Patient Instructions  Lantus 32 units daily  Novolog at lunch 18 and 22 at Bfst and supper  Metformin 1, twice daily  Repatha 1 shot every 2 weeks  Lasix 20mg  daily    Counseling time on subjects discussed above is over 50% of today's 25 minute visit    Malina Geers 08/30/2014, 12:58 PM   Note: This office note was prepared with Insurance underwriter. Any transcriptional errors that result from this process are unintentional.

## 2014-09-18 ENCOUNTER — Ambulatory Visit (INDEPENDENT_AMBULATORY_CARE_PROVIDER_SITE_OTHER): Payer: Medicare Other | Admitting: Podiatry

## 2014-09-18 ENCOUNTER — Ambulatory Visit (INDEPENDENT_AMBULATORY_CARE_PROVIDER_SITE_OTHER): Payer: Medicare Other

## 2014-09-18 ENCOUNTER — Encounter: Payer: Self-pay | Admitting: Podiatry

## 2014-09-18 VITALS — BP 179/79 | HR 61 | Resp 16

## 2014-09-18 DIAGNOSIS — M79671 Pain in right foot: Secondary | ICD-10-CM

## 2014-09-18 DIAGNOSIS — M79672 Pain in left foot: Secondary | ICD-10-CM

## 2014-09-18 DIAGNOSIS — M779 Enthesopathy, unspecified: Secondary | ICD-10-CM | POA: Diagnosis not present

## 2014-09-18 MED ORDER — TRIAMCINOLONE ACETONIDE 10 MG/ML IJ SUSP
10.0000 mg | Freq: Once | INTRAMUSCULAR | Status: AC
  Administered 2014-09-18: 10 mg

## 2014-09-18 NOTE — Progress Notes (Signed)
Subjective:     Patient ID: Heidi Campbell, female   DOB: July 04, 1945, 69 y.o.   MRN: [id number]  HPI patient states that I went on vacation and I walk a lot in the ankles of started to hurt and I'm having trouble getting around. It's the inside of the ankles and they're swollen and inflamed and I like something done   Review of Systems     Objective:   Physical Exam Neurovascular status was found to be intact from previous visits with patient having splinting within the subtalar joint when I tested it and discomfort in the posterior tibial tendon of both feet as it comes under the medial malleolus. The muscles do have normal strength but are inflamed    Assessment:     Tendinitis of the posterior tibial tendon near insertion    Plan:     H&P condition reviewed and careful sheath injections administered 3 mg dexamethasone Kenalog 5 mg Xylocaine and I advised her to watch her sugar more closely over the next several days. Reappoint as needed

## 2014-09-19 ENCOUNTER — Other Ambulatory Visit (INDEPENDENT_AMBULATORY_CARE_PROVIDER_SITE_OTHER): Payer: Medicare Other | Admitting: *Deleted

## 2014-09-19 ENCOUNTER — Other Ambulatory Visit: Payer: Self-pay | Admitting: *Deleted

## 2014-09-19 ENCOUNTER — Ambulatory Visit: Payer: Medicare Other | Admitting: Pharmacist

## 2014-09-19 DIAGNOSIS — I10 Essential (primary) hypertension: Secondary | ICD-10-CM

## 2014-09-19 LAB — BASIC METABOLIC PANEL
BUN: 16 mg/dL (ref 6–23)
CHLORIDE: 104 meq/L (ref 96–112)
CO2: 25 mEq/L (ref 19–32)
Calcium: 9.5 mg/dL (ref 8.4–10.5)
Creatinine, Ser: 0.98 mg/dL (ref 0.40–1.20)
GFR: 72.27 mL/min (ref >60.00)
GLUCOSE: 292 mg/dL — AB (ref 70–99)
POTASSIUM: 4 meq/L (ref 3.5–5.1)
Sodium: 137 mEq/L (ref 135–145)

## 2014-09-20 ENCOUNTER — Ambulatory Visit (INDEPENDENT_AMBULATORY_CARE_PROVIDER_SITE_OTHER): Payer: Medicare Other | Admitting: Pharmacist

## 2014-09-20 ENCOUNTER — Encounter: Payer: Self-pay | Admitting: Pharmacist

## 2014-09-20 VITALS — BP 172/58 | HR 69 | Wt 222.0 lb

## 2014-09-20 DIAGNOSIS — I10 Essential (primary) hypertension: Secondary | ICD-10-CM | POA: Diagnosis not present

## 2014-09-20 MED ORDER — SPIRONOLACTONE 25 MG PO TABS: 25.0000 mg | ORAL_TABLET | Freq: Every day | ORAL | Status: DC

## 2014-09-20 NOTE — Patient Instructions (Addendum)
Pick up your prescription for spironolactone  and start taking once a day Stop taking your potassium supplement Start checking your blood pressure at home We will see you in clinic in 2 weeks to follow up with your blood pressure and for lab work on Thursday, September 1 at 10am If you have any questions, call clinic [phone]

## 2014-09-20 NOTE — Progress Notes (Signed)
Patient ID: Heidi Campbell           DOB: August 31, 2045, 69 yo                MRN: [id number]     HPI: Heidi Campbell is a 69 y.o. female patient of Dr. Eldridge Dace who returns to HTN clinic for follow up. PMH is significant for DM, CAD, HTN. Her BP was fairly well controlled but over the past year it has been elevated. There were no major differences in her medications over the past year. She went to the ED on 6/24 for elevated BP. Her BP was 268/113 and amlodipine  daily was added. It was still elevated at 202/100 on 7/8 and Cardura was added. Her BP on 7/14 had improved to 148/60. She was seen by Dr. Lucianne Muss last month when furosemide  daily + potassium daily were added d/t lower extremity swelling.  Her renal function had been elevated and so this has limited her ability to start an aldosterone antagonist. However, recent BMET from yesterday 09/19/14 showed return to baseline of SCr 0.98, K stable at 4 since adding Lasix and potassium last month.  She plans to buy a BP cuff this weekend and start monitoring her BP at home daily. Of note, medication list stated pt was taking clonidine 0.2mg  daily, however she has been taking it BID.  Current HTN meds:  AM: Irbesartan/HCTZ 300/12.5mg  daily Furosemide  daily (started taking 09/16/14) Clonidine 0.2mg   PM: Amlodipine  daily Doxazosin  Clonidine 0.2mg   Goal BP: <150/42mmHg. Elevated at 172/58 today, pulse 69.  FH: Father had died from MI at 57. Mother developed DM in her 41s.  SH: non smoker, no alcohol, no illicit drug use  Diet: Pt states she got sick at the beginning of June and has not had a taste for food over the past 6 weeks.  More recently, has been eating oatmeal with cinnamon, toast and coffee for breakfast. Lunch consisted of grilled chicken salad and water. Dinner was fish and hushpuppies and water.She typically drinks water or caffeine free drinks during the day. She does have a cup of coffee in the AM.  She does not add salt to her food and tries to avoid processed foods.  Exercise: Was riding her exercise bike 1-2 times a week but has not done this since June due to foot problems.  Wt Readings from Last 3 Encounters:  09/20/14 222 lb (100.699 kg)  08/30/14 222 lb 12 oz (101.039 kg)  08/30/14 222 lb 12.8 oz (101.061 kg)   BP Readings from Last 3 Encounters:  09/20/14 172/58  09/18/14 179/79  08/30/14 178/78   Pulse Readings from Last 3 Encounters:  09/20/14 69  09/18/14 61  08/30/14 58    Renal function: Estimated Creatinine Clearance: 60.1 mL/min (by C-G formula based on Cr of 0.98).  Past Medical History  Diagnosis Date  . Hypertension   . Diabetes mellitus without complication   . ALLERGIC RHINITIS   . ASTHMA   . Esophageal reflux   . SINUSITIS, ACUTE     Allergies  Allergen Reactions  . Ace Inhibitors Cough  . Shellfish Allergy Other (See Comments)    Pt cannot recall at this time     Assessment/Plan: 1. Hypertension - Patient with elevated sBP readings consistently in the 170s over the past month despite therapy with amlodipine, irbesartan/HCTZ, furosemide, clonidine, and doxazosin. Goal BP for patient < 150/57mmHg. Next options for patient to help with BP control either  hydralazine or spironolactone. Patient's renal function has improved and SCr back to baseline <1 with K stable at 4. Given TID dosing regimen of hydralazine, will begin spironolactone 25mg  daily and stop potassium daily. Patient will also buy a BP cuff this weekend and begin monitoring her BP daily. F/u in HTN clinic in 2 weeks, will recheck BMET at that time to monitor K and SCr. Patient understands and is in agreement with plan.   Megan E. Supple, PharmD [facility] [address], [place], [place] [address] Phone: [phone]; Fax: [phone] 09/20/2014 5:09 PM

## 2014-09-24 ENCOUNTER — Telehealth: Payer: Self-pay | Admitting: Interventional Cardiology

## 2014-09-24 NOTE — Telephone Encounter (Signed)
Left message to call back  

## 2014-09-24 NOTE — Telephone Encounter (Signed)
New message ° ° ° ° °Returning a nurses call to get lab results °

## 2014-09-25 NOTE — Telephone Encounter (Signed)
Left message to call back  

## 2014-09-27 NOTE — Telephone Encounter (Signed)
Spoke with pt and informed her of lab results. Pt states that she recently went to PCP and her BP at that time was 155/90. Pt states that it could have been up due to stress though because she was there due to getting a piece of Qtip stuck in her ear. Pt states that she bought a BP cuff to use at home and will start monitoring her BP. Reminded pt of appt with HTN clinic on 9/1 at 10A.   Pt verbalized understanding and states that he plans to come to appt.

## 2014-09-27 NOTE — Telephone Encounter (Signed)
Attempted to call pt and phone rang several times with no answer and no voicemail. Unable to leave message. Will try again later.

## 2014-09-27 NOTE — Telephone Encounter (Signed)
Follow up  ° ° ° ° °Pt is returning your call  °

## 2014-10-03 ENCOUNTER — Ambulatory Visit (INDEPENDENT_AMBULATORY_CARE_PROVIDER_SITE_OTHER): Payer: Medicare Other | Admitting: Pharmacist

## 2014-10-03 VITALS — BP 183/69 | HR 47 | Wt 226.0 lb

## 2014-10-03 DIAGNOSIS — I10 Essential (primary) hypertension: Secondary | ICD-10-CM

## 2014-10-03 LAB — BASIC METABOLIC PANEL
BUN: 20 mg/dL (ref 6–23)
CALCIUM: 9.2 mg/dL (ref 8.4–10.5)
CO2: 29 meq/L (ref 19–32)
CREATININE: 0.95 mg/dL (ref 0.40–1.20)
Chloride: 104 mEq/L (ref 96–112)
GFR: 74.9 mL/min (ref >60.00)
GLUCOSE: 95 mg/dL (ref 70–99)
Potassium: 3.9 mEq/L (ref 3.5–5.1)
SODIUM: 139 meq/L (ref 135–145)

## 2014-10-03 MED ORDER — CLONIDINE HCL 0.1 MG PO TABS
0.3000 mg | ORAL_TABLET | Freq: Once | ORAL | Status: AC
  Administered 2014-10-03: 0.3 mg via ORAL

## 2014-10-03 MED ORDER — HYDRALAZINE HCL 25 MG PO TABS: ORAL_TABLET | ORAL | Status: DC

## 2014-10-03 MED ORDER — HYDRALAZINE HCL 25 MG PO TABS: 25.0000 mg | ORAL_TABLET | Freq: Three times a day (TID) | ORAL | Status: DC

## 2014-10-03 NOTE — Progress Notes (Signed)
Patient ID: Heidi Campbell                 DOB: 08-Jan-2046, 69 yo                      MRN: [id number]     HPI: Heidi Campbell is a 69 y.o. female patient of Dr. Eldridge Dace who returns to HTN clinic for follow up. PMH is significant for DM, CAD, HTN. Her BP was fairly well controlled but over the past year it has been elevated. There were no major differences in her medications over the past year. She went to the ED on 6/24 for elevated BP. Her BP was 268/113 and amlodipine 10mg  daily was added. It was still elevated at 202/100 on 7/8 and Cardura was added. Her BP on 7/14 had improved to 148/60. She was seen by Dr. Lucianne Muss last month when furosemide 20mg  daily + potassium daily were added d/t lower extremity swelling.  Her renal function had been elevated and so this has limited her ability to start an aldosterone antagonist. However, recent BMET from yesterday 09/19/14 showed return to baseline of SCr 0.98, K stable at 4 since adding Lasix and potassium last month. During last pharmacy HTN visit, spironolactone 25mg  daily was added and K supplement discontinued.  Patient has bought a BP cuff since last visit and checks her BP at home twice a day, in the morning when she wakes up and around 6pm. Both of these checks are before she takes her BP medications, and she reports that systolic readings have been running 170s-180s. Patient reports that she has only been taking her clonidine in the PM rather than BID.    Current HTN Meds:  AM: Irbesartan/HCTZ 300/12.5mg  daily Furosemide 20mg  daily (started taking 09/16/14) Clonidine 0.2mg  - patient has not been taking AM dose Spironolactone 25mg  daily (started taking 09/21/14)  PM: Amlodipine 10mg  daily Doxazosin 4mg  daily Clonidine 0.2mg   *Pulse averages low 50s - avoid beta blocker for BP control  Clinic Events: Patient reports that she did not take any of her BP medications this morning before coming to clinic. Manual BP reading was 198/88,  automatic BP recheck elevated even higher at 226/76, pulse of 53 both times. Patient reports that she had a headache and blurred vision. She states that this happens occasionally when her BP is really high.  Gave patient clonidine 0.3mg , spironolactone 25mg , and losartan 100mg  in clinic and rechecked BP 30 minutes later. BP 30 minutes later down to 197/74, pulse of 52. Discussed with DOD Dr. Graciela Husbands - ok to send pt home if recheck still trending down. Rechecked again in 20 minutes, continued to trend down to 183/69, pulse of 47.  Goal BP: <150/40mmHg.   FH: Father had died from MI at 4. Mother developed DM in her 58s.  SH: non smoker, no alcohol, no illicit drug use  Diet: Pt states she got sick at the beginning of June and has not had a taste for food over the past 6 weeks. More recently, has been eating oatmeal with cinnamon, toast and coffee for breakfast. Lunch consisted of grilled chicken salad and water. Dinner was fish and hushpuppies and water.She typically drinks water or caffeine free drinks during the day. She does have a cup of coffee in the AM. She does not add salt to her food and tries to avoid processed foods.  Exercise: Was riding her exercise bike 1-2 times a week but has not done this  since June due to foot problems.  Wt Readings from Last 3 Encounters:  09/20/14 222 lb (100.699 kg)  08/30/14 222 lb 12 oz (101.039 kg)  08/30/14 222 lb 12.8 oz (101.061 kg)   BP Readings from Last 3 Encounters:  09/20/14 172/58  09/18/14 179/79  08/30/14 178/78   Pulse Readings from Last 3 Encounters:  09/20/14 69  09/18/14 61  08/30/14 58    Renal function: Estimated Creatinine Clearance: 60.1 mL/min (by C-G formula based on Cr of 0.98).  Past Medical History  Diagnosis Date  . Hypertension   . Diabetes mellitus without complication   . ALLERGIC RHINITIS   . ASTHMA   . Esophageal reflux   . SINUSITIS, ACUTE     Current Outpatient Prescriptions on File Prior to Visit    Medication Sig Dispense Refill  . acetaminophen (TYLENOL 8 HOUR ARTHRITIS PAIN) 650 MG CR tablet Take 650 mg by mouth every 8 (eight) hours as needed for pain.    Marland Kitchen albuterol (PROVENTIL HFA;VENTOLIN HFA) 108 (90 BASE) MCG/ACT inhaler Inhale 2 puffs into the lungs every 6 (six) hours as needed for wheezing or shortness of breath.    Marland Kitchen amLODipine (NORVASC) 10 MG tablet Take 1 tablet (10 mg total) by mouth daily. 30 tablet 1  . aspirin 81 MG tablet Take 81 mg by mouth daily.    . cloNIDine (CATAPRES) 0.2 MG tablet Take 0.2 mg by mouth 2 (two) times daily.     Marland Kitchen doxazosin (CARDURA) 4 MG tablet Take 4 mg by mouth at bedtime.    . Evolocumab (REPATHA) 140 MG/ML SOSY Inject 140 mg into the skin every 14 (fourteen) days.     Marland Kitchen ezetimibe (ZETIA) 10 MG tablet Take 1 tablet (10 mg total) by mouth daily. 30 tablet 3  . fluticasone (FLONASE) 50 MCG/ACT nasal spray Place 1 spray into both nostrils daily as needed for allergies.     . Fluticasone-Salmeterol (ADVAIR) 100-50 MCG/DOSE AEPB Inhale 1 puff into the lungs as needed.    . furosemide (LASIX) 20 MG tablet Take 1 tablet (20 mg total) by mouth daily. 30 tablet 3  . glucose blood (ONETOUCH VERIO) test strip Use as instructed to check blood sugars 2 times per day, dx code 250.00 100 each 5  . insulin aspart (NOVOLOG FLEXPEN) 100 UNIT/ML FlexPen Inject 15 Units into the skin 2 (two) times daily. (Patient taking differently: Inject 15 Units into the skin 2 (two) times daily. ss) 15 mL 2  . Insulin Glargine (LANTUS) 100 UNIT/ML Solostar Pen Inject 34 Units into the skin daily at 10 pm. 15 mL 2  . irbesartan-hydrochlorothiazide (AVALIDE) 300-12.5 MG per tablet Take 0.5 tablets by mouth daily.    . Liraglutide (VICTOZA) 18 MG/3ML SOPN Inject 0.2 mLs (1.2 mg total) into the skin daily. Inject once daily at the same time 6 mL 2  . metFORMIN (GLUCOPHAGE) 1000 MG tablet Take 1 tablet (1,000 mg total) by mouth 2 (two) times daily with a meal. 60 tablet 2  . omeprazole  (PRILOSEC) 20 MG capsule Take 20 mg by mouth daily.    . polyethylene glycol (MIRALAX / GLYCOLAX) packet Take 17 g by mouth daily as needed for moderate constipation.     . rosuvastatin (CRESTOR) 40 MG tablet Take 1 tablet (40 mg total) by mouth daily. 30 tablet 6  . spironolactone (ALDACTONE) 25 MG tablet Take 1 tablet (25 mg total) by mouth daily. 90 tablet 3   No current facility-administered medications on file  prior to visit.    Allergies  Allergen Reactions  . Ace Inhibitors Cough  . Shellfish Allergy Other (See Comments)    Pt cannot recall at this time     Assessment/Plan:  1. Hypertension - BP still uncontrolled with systolic BP readings trending 161W-960A at home despite therapy with 7 antihypertensive agents. Goal BP <150/77mmHg. BP elevated to highest of 226/76 in clinic today because patient did not take her 4 AM BP medications. After giving clonidine 0.3mg , losartan , and spironolactone  in clinic, 2 BP rechecks trended down to lowest of 183/69 before patient sent home. Will increase clonidine back to 0.2mg  BID since pt had only been taking PM dose. Added hydralazine  with directions to start at 1/2 tablet TID and increase to 1 tablet TID after 1 week if systolic BP still averaging >170. Will f/u with pt in BP clinic in 2 weeks. Will check BMET today since spironolactone added 2 weeks ago. May need to increase spironolactone or hydralazine at future visits. Advised patient to make sure she takes her AM BP medications before coming in for HTN clinic follow up.    Megan E. Supple, PharmD [facility] [address] [place], [place] [address] Phone: [phone]; Fax: [phone] 10/03/2014 12:06 PM

## 2014-10-03 NOTE — Patient Instructions (Addendum)
-   Take your Lasix this morning - we gave you the rest of your blood pressure medicines in clinic this morning, then continue taking your usual blood pressure pills this evening - Increase clonidine 0.2mg  back to twice daily - Start taking hydralazine 1/2 tablet (12.5mg ) 3 times a day for 1 week. If your blood pressure is still > 170, increase to take 1 tablet 3 times a day. - Come back for blood pressure check in 2 weeks (Thursday, September 15th at 10am) - make sure you take your morning medicine before you come in - Continue checking your blood pressure at home and bring readings in with you to the next visit

## 2014-10-11 ENCOUNTER — Other Ambulatory Visit: Payer: Medicare Other

## 2014-10-16 ENCOUNTER — Ambulatory Visit: Payer: Medicare Other | Admitting: Endocrinology

## 2014-10-17 ENCOUNTER — Ambulatory Visit (INDEPENDENT_AMBULATORY_CARE_PROVIDER_SITE_OTHER): Payer: Medicare Other | Admitting: Pharmacist

## 2014-10-17 VITALS — BP 164/70 | HR 60 | Wt 227.0 lb

## 2014-10-17 DIAGNOSIS — I10 Essential (primary) hypertension: Secondary | ICD-10-CM | POA: Diagnosis not present

## 2014-10-17 MED ORDER — HYDRALAZINE HCL 50 MG PO TABS: ORAL_TABLET | ORAL | Status: AC

## 2014-10-17 NOTE — Patient Instructions (Signed)
Start taking hydralazine 2 tablets (  each) 3 times a day. I sent over a new prescription for hydralazine  tablets to take 1 tablet 3 times a day. Continue all other blood pressure medications as usual Follow up in blood pressure clinic in 2 weeks (September 29th at 10am). Please take your morning blood pressure medications before you come in.

## 2014-10-17 NOTE — Progress Notes (Signed)
Patient ID: Heidi Campbell                DOB: 2045/10/18, 69 yo                          MRN: [id number]     HPI: Heidi Campbell is a 69 y.o. female patient of Dr. Eldridge Dace who returns to HTN clinic for follow up. PMH is significant for DM, CAD, HTN. Her BP was fairly well controlled but over the past year it has been elevated. There were no major differences in her medications over the past year. She went to the ED on 6/24 for elevated BP. Her BP was 268/113 and amlodipine 10mg  daily was added. It was still elevated at 202/100 on 7/8 and Cardura was added. Her BP on 7/14 had improved to 148/60. She was seen by Dr. Lucianne Muss last month when furosemide 20mg  daily + potassium daily were added d/t lower extremity swelling.  Her renal function had been elevated and so this has limited her ability to start an aldosterone antagonist. However, recent BMET from yesterday 09/19/14 showed return to baseline of SCr 0.98, K stable at 4 since adding Lasix and potassium last month. During last pharmacy HTN visit, spironolactone 25mg  daily was added and K supplement discontinued.  Patient has bought a BP cuff since last visit and checks her BP at home twice a day, in the morning when she wakes up and around 6pm. Both of these checks are before she takes her BP medications, and she reports that systolic readings have been running 170s-190s.   Current HTN meds: AM: Irbesartan/HCTZ 300/12.5mg  daily Furosemide 20mg  daily Clonidine 0.2mg   Spironolactone 25mg  daily Hydralazine 25mg   Lunch: Hydralazine 25mg   PM: Amlodipine 10mg  daily Doxazosin 4mg  daily Clonidine 0.2mg  Hydralazine 25mg   BP goal: <150/55mmHg  FH: Father had died from MI at 59. Mother developed DM in her 32s.  SH: non smoker, no alcohol, no illicit drug use  Diet: Pt states she got sick at the beginning of June and has not had a taste for food over the past 6 weeks. More recently, has been eating oatmeal with cinnamon, toast and  coffee for breakfast. Lunch consisted of grilled chicken salad and water. Dinner was fish and hushpuppies and water.She typically drinks water or caffeine free drinks during the day. She does have a cup of coffee in the AM. She does not add salt to her food and tries to avoid processed foods.  Exercise: Was riding her exercise bike 1-2 times a week but has not done this since June due to foot problems.  Wt Readings from Last 3 Encounters:  10/03/14 226 lb (102.513 kg)  09/20/14 222 lb (100.699 kg)  08/30/14 222 lb 12 oz (101.039 kg)   BP Readings from Last 3 Encounters:  10/17/14 164/70  10/03/14 183/69  09/20/14 172/58   Pulse Readings from Last 3 Encounters:  10/17/14 60  10/03/14 47  09/20/14 69    Renal function: Estimated Creatinine Clearance: 62.7 mL/min (by C-G formula based on Cr of 0.95).  Past Medical History  Diagnosis Date  . Hypertension   . Diabetes mellitus without complication   . ALLERGIC RHINITIS   . ASTHMA   . Esophageal reflux   . SINUSITIS, ACUTE     Current Outpatient Prescriptions on File Prior to Visit  Medication Sig Dispense Refill  . acetaminophen (TYLENOL 8 HOUR ARTHRITIS PAIN) 650 MG CR tablet Take 650  mg by mouth every 8 (eight) hours as needed for pain.    Marland Kitchen albuterol (PROVENTIL HFA;VENTOLIN HFA) 108 (90 BASE) MCG/ACT inhaler Inhale 2 puffs into the lungs every 6 (six) hours as needed for wheezing or shortness of breath.    Marland Kitchen amLODipine (NORVASC) 10 MG tablet Take 1 tablet (10 mg total) by mouth daily. 30 tablet 1  . aspirin 81 MG tablet Take 81 mg by mouth daily.    . cloNIDine (CATAPRES) 0.2 MG tablet Take 0.2 mg by mouth 2 (two) times daily.     Marland Kitchen doxazosin (CARDURA) 4 MG tablet Take 4 mg by mouth at bedtime.    . Evolocumab (REPATHA) 140 MG/ML SOSY Inject 140 mg into the skin every 14 (fourteen) days.     Marland Kitchen ezetimibe (ZETIA) 10 MG tablet Take 1 tablet (10 mg total) by mouth daily. 30 tablet 3  . fluticasone (FLONASE) 50 MCG/ACT nasal  spray Place 1 spray into both nostrils daily as needed for allergies.     . Fluticasone-Salmeterol (ADVAIR) 100-50 MCG/DOSE AEPB Inhale 1 puff into the lungs as needed.    . furosemide (LASIX) 20 MG tablet Take 1 tablet (20 mg total) by mouth daily. 30 tablet 3  . glucose blood (ONETOUCH VERIO) test strip Use as instructed to check blood sugars 2 times per day, dx code 250.00 100 each 5  . insulin aspart (NOVOLOG FLEXPEN) 100 UNIT/ML FlexPen Inject 15 Units into the skin 2 (two) times daily. (Patient taking differently: Inject 15 Units into the skin 2 (two) times daily. ss) 15 mL 2  . Insulin Glargine (LANTUS) 100 UNIT/ML Solostar Pen Inject 34 Units into the skin daily at 10 pm. 15 mL 2  . irbesartan-hydrochlorothiazide (AVALIDE) 300-12.5 MG per tablet Take 0.5 tablets by mouth daily.    . Liraglutide (VICTOZA) 18 MG/3ML SOPN Inject 0.2 mLs (1.2 mg total) into the skin daily. Inject once daily at the same time 6 mL 2  . metFORMIN (GLUCOPHAGE) 1000 MG tablet Take 1 tablet (1,000 mg total) by mouth 2 (two) times daily with a meal. 60 tablet 2  . omeprazole (PRILOSEC) 20 MG capsule Take 20 mg by mouth daily.    . polyethylene glycol (MIRALAX / GLYCOLAX) packet Take 17 g by mouth daily as needed for moderate constipation.     . rosuvastatin (CRESTOR) 40 MG tablet Take 1 tablet (40 mg total) by mouth daily. 30 tablet 6  . spironolactone (ALDACTONE) 25 MG tablet Take 1 tablet (25 mg total) by mouth daily. 90 tablet 3   No current facility-administered medications on file prior to visit.    Allergies  Allergen Reactions  . Ace Inhibitors Cough  . Shellfish Allergy Other (See Comments)    Pt cannot recall at this time     Assessment/Plan:  1. Hypertension - BP remains above goal <150/60mmHg despite treatment with 8 agents for HTN. Patient is compliant with medications - last visit she did not take her AM BP medications and her sBP was in the 220s. This visit she took her AM medications and BP was  164/70. She reports home systolic readings trending 170s-190s. At last visit 2 weeks ago, added hydralazine  TID. Will increase to  TID today and f/u in 2 weeks in clinic. May need to increase spironolactone or consider renal artery scan in the future if BP still not controlled.   Megan E. Supple, PharmD [facility] [address], [place], [place] [address] Phone: [phone];  Fax: [phone] 10/17/2014 10:55 AM

## 2014-10-31 ENCOUNTER — Encounter: Payer: Self-pay | Admitting: Pharmacist

## 2014-10-31 ENCOUNTER — Ambulatory Visit (INDEPENDENT_AMBULATORY_CARE_PROVIDER_SITE_OTHER): Payer: Medicare Other | Admitting: Pharmacist

## 2014-10-31 VITALS — BP 165/64 | HR 65 | Wt 218.0 lb

## 2014-10-31 DIAGNOSIS — I10 Essential (primary) hypertension: Secondary | ICD-10-CM | POA: Diagnosis not present

## 2014-10-31 MED ORDER — SPIRONOLACTONE 50 MG PO TABS: 50.0000 mg | ORAL_TABLET | Freq: Every day | ORAL | Status: DC

## 2014-10-31 NOTE — Progress Notes (Signed)
Patient ID: Heidi Campbell DOB: 2045/05/07, 69 yo MRN: [id number]     HPI: Heidi Campbell is a 69 y.o. female patient of Dr. Eldridge Dace who returns to HTN clinic for follow up. PMH is significant for DM, CAD, HTN. Her BP was fairly well controlled but over the past year it has been elevated. There were no major differences in her medications over the past year. She went to the ED on 6/24 for elevated BP. Her BP was 268/113 and amlodipine  daily was added. It was still elevated at 202/100 on 7/8 and Cardura was added. Her BP on 7/14 had improved to 148/60. She was seen by Dr. Lucianne Muss last month when furosemide  daily + potassium daily were added d/t lower extremity swelling.  Her renal function had been elevated and so this has limited her ability to start an aldosterone antagonist. However, recent BMET from yesterday 09/19/14 showed return to baseline of SCr 0.98, K stable at 4 since adding Lasix and potassium last month. During pharmacy HTN visit a month ago, spironolactone  daily was added and K supplement discontinued.  Patient recently bought a BP cuff and was checking her BP at home twice a day. However, she reports that she has had back pain since our last visit and has not checked her BP at all. She did pick up her higher dose of hydralazine that was increased at last visit and reports no side effects or tolerability issues.  Current HTN meds: AM: Irbesartan/HCTZ 300/12.5mg  daily Furosemide  daily Clonidine 0.2mg   Spironolactone  daily Hydralazine   Lunch: Hydralazine   PM: Amlodipine  daily Doxazosin  daily Clonidine 0.2mg  Hydralazine   BP goal: <150/29mmHg  FH: Father had died from MI at 63. Mother developed DM in her 31s.  SH: non smoker, no alcohol, no illicit drug use  Diet: Patient reports that she has completely cut out salt from her diet. She has also lost 9 pounds since her last visit 2  weeks ago. Breakfast - oatmeal with cinnamon, toast and coffee. Lunch - grilled chicken salad and water.Dinner - fish and hushpuppies.She typically drinks water or caffeine free drinks during the day. She does have a cup of coffee in the AM.She does not add salt to her food and tries to avoid processed foods.  Exercise: Was riding her exercise bike 1-2 times a week but has not done this since June due to foot problems and back pain.  Wt Readings from Last 3 Encounters:  10/31/14 218 lb (98.884 kg)  10/17/14 227 lb (102.967 kg)  10/03/14 226 lb (102.513 kg)   BP Readings from Last 3 Encounters:  10/31/14 165/64  10/17/14 164/70  10/03/14 183/69   Pulse Readings from Last 3 Encounters:  10/31/14 65  10/17/14 60  10/03/14 47    Renal function: CrCl cannot be calculated (Patient has no serum creatinine result on file.).  Past Medical History  Diagnosis Date  . Hypertension   . Diabetes mellitus without complication   . ALLERGIC RHINITIS   . ASTHMA   . Esophageal reflux   . SINUSITIS, ACUTE     Current Outpatient Prescriptions on File Prior to Visit  Medication Sig Dispense Refill  . acetaminophen (TYLENOL 8 HOUR ARTHRITIS PAIN) 650 MG CR tablet Take 650 mg by mouth every 8 (eight) hours as needed for pain.    Marland Kitchen albuterol (PROVENTIL HFA;VENTOLIN HFA) 108 (90 BASE) MCG/ACT inhaler Inhale 2 puffs into the lungs every 6 (six) hours as needed  for wheezing or shortness of breath.    Marland Kitchen amLODipine (NORVASC) 10 MG tablet Take 1 tablet (10 mg total) by mouth daily. 30 tablet 1  . aspirin 81 MG tablet Take 81 mg by mouth daily.    . cloNIDine (CATAPRES) 0.2 MG tablet Take 0.2 mg by mouth 2 (two) times daily.     Marland Kitchen doxazosin (CARDURA) 4 MG tablet Take 4 mg by mouth at bedtime.    . Evolocumab (REPATHA) 140 MG/ML SOSY Inject 140 mg into the skin every 14 (fourteen) days.     Marland Kitchen ezetimibe (ZETIA) 10 MG tablet Take 1 tablet (10 mg total) by mouth daily. 30 tablet 3  . fluticasone (FLONASE)  50 MCG/ACT nasal spray Place 1 spray into both nostrils daily as needed for allergies.     . Fluticasone-Salmeterol (ADVAIR) 100-50 MCG/DOSE AEPB Inhale 1 puff into the lungs as needed.    . furosemide (LASIX) 20 MG tablet Take 1 tablet (20 mg total) by mouth daily. 30 tablet 3  . glucose blood (ONETOUCH VERIO) test strip Use as instructed to check blood sugars 2 times per day, dx code 250.00 100 each 5  . hydrALAZINE (APRESOLINE) 50 MG tablet Take 1 tablet by mouth 3 times a day. 90 tablet 11  . insulin aspart (NOVOLOG FLEXPEN) 100 UNIT/ML FlexPen Inject 15 Units into the skin 2 (two) times daily. (Patient taking differently: Inject 15 Units into the skin 2 (two) times daily. ss) 15 mL 2  . Insulin Glargine (LANTUS) 100 UNIT/ML Solostar Pen Inject 34 Units into the skin daily at 10 pm. 15 mL 2  . irbesartan-hydrochlorothiazide (AVALIDE) 300-12.5 MG per tablet Take 0.5 tablets by mouth daily.    . Liraglutide (VICTOZA) 18 MG/3ML SOPN Inject 0.2 mLs (1.2 mg total) into the skin daily. Inject once daily at the same time 6 mL 2  . metFORMIN (GLUCOPHAGE) 1000 MG tablet Take 1 tablet (1,000 mg total) by mouth 2 (two) times daily with a meal. 60 tablet 2  . omeprazole (PRILOSEC) 20 MG capsule Take 20 mg by mouth daily.    . polyethylene glycol (MIRALAX / GLYCOLAX) packet Take 17 g by mouth daily as needed for moderate constipation.     . rosuvastatin (CRESTOR) 40 MG tablet Take 1 tablet (40 mg total) by mouth daily. 30 tablet 6   No current facility-administered medications on file prior to visit.    Allergies  Allergen Reactions  . Ace Inhibitors Cough  . Shellfish Allergy Other (See Comments)    Pt cannot recall at this time     Assessment/Plan:  1. Hypertension - patient still above goal <150/13mmHg with BP reading in clinic today 165/64. She reports adherence with her 8 HTN medications. Congratulated patient on healthy food choices and limiting salt which has resulted in 9 lb weight loss  since last visit. Did not see any BP lowering since increasing hydralazine from  TID to  TID at last visit, however patient reports a lot of back pain since then which may be elevating BP as well. Will increase spironolactone to  once daily. New rx sent to pharmacy since pt was almost out of  strength. Asked patient to start monitoring BP at home again. Will f/u in BP clinic in 2 weeks and recheck BMET then.   Megan E. Supple, PharmD [facility] [address], [place], [place] [address] Phone: [phone]; Fax: [phone] 10/31/2014 10:20 AM

## 2014-10-31 NOTE — Patient Instructions (Addendum)
It was nice to see you in clinic today. Keep up the great work limiting salt in your diet. We are increasing the dose of your spironolactone to  once daily (continue taking in the morning) We will see you in blood pressure clinic in 2 weeks on Thursday, October 13 at 10am   New blood pressure medications are below:  AM: Irbesartan/HCTZ 300/12.5mg  daily Furosemide  daily Clonidine 0.2mg   Spironolactone  daily Hydralazine   Lunch: Hydralazine   PM: Amlodipine  daily Doxazosin  daily Clonidine 0.2mg  Hydralazine 

## 2014-11-14 ENCOUNTER — Ambulatory Visit (INDEPENDENT_AMBULATORY_CARE_PROVIDER_SITE_OTHER): Payer: Medicare Other | Admitting: Pharmacist

## 2014-11-14 ENCOUNTER — Encounter: Payer: Self-pay | Admitting: Pharmacist

## 2014-11-14 VITALS — BP 148/78 | HR 53 | Ht 62.0 in | Wt 227.0 lb

## 2014-11-14 DIAGNOSIS — I10 Essential (primary) hypertension: Secondary | ICD-10-CM

## 2014-11-14 LAB — BASIC METABOLIC PANEL
BUN: 17 mg/dL (ref 7–25)
CALCIUM: 9.3 mg/dL (ref 8.6–10.4)
CO2: 28 mmol/L (ref 20–31)
CREATININE: 1.22 mg/dL — AB (ref 0.50–0.99)
Chloride: 104 mmol/L (ref 98–110)
Glucose, Bld: 193 mg/dL — ABNORMAL HIGH (ref 65–99)
Potassium: 4.2 mmol/L (ref 3.5–5.3)
Sodium: 137 mmol/L (ref 135–146)

## 2014-11-14 NOTE — Progress Notes (Signed)
Patient ID: Heidi Campbell              DOB: 10-Mar-1945, 69 yo                         MRN: [id number]      HPI: Heidi Campbell is a 69 y.o. female patient of Dr. Eldridge Dace who returns to HTN clinic for 4th follow up. PMH is significant for DM, CAD, HTN. Her BP was fairly well controlled but over the past year it has been elevated. There were no major differences in her medications over the past year. She went to the ED on 6/24 for elevated BP. Her BP was 268/113 and amlodipine  daily was added. It was still elevated at 202/100 on 7/8 and Cardura was added. Her BP on 7/14 had improved to 148/60. She was seen by Dr. Lucianne Muss when furosemide  daily + potassium daily were added d/t lower extremity swelling.  Her renal function had been elevated which limited her ability to start an aldosterone antagonist. However, recent BMET on 09/19/14 showed return to baseline of SCr 0.98, K stable at 4 since adding Lasix and potassium. During recent pharmacy HTN visits, spironolactone was started and titrated up to  daily. K supplement was discontinued, although patient today reports that she has been still taking some potassium but she does not know what dose.   Patient recently bought a BP cuff and was checking her BP at home twice a day. However, she has not checked her BP at all recently because of forgetfulness. She thinks she would remember more if she checked it in the evening when she reads.  Current HTN meds: AM: Irbesartan/HCTZ 300/12.5mg  daily Furosemide  daily Clonidine 0.2mg   Spironolactone  daily Hydralazine   Lunch: Hydralazine   PM: Amlodipine  daily Doxazosin  daily Clonidine 0.2mg  Hydralazine    BP goal: <150/78mmHg. 148/4mmHg today in clinic, pulse 53. Patient took all AM BP meds, also had a cup of coffee 1 and 1/2 hours ago.  FH: Father had died from MI at 33. Mother developed DM in her 75s.  SH: non smoker, no alcohol, no illicit  drug use  Diet: Patient reports that she has completely cut out salt from her diet. She had lost 9 pounds at last visit 2 weeks ago, but weight back up to baseline today. Patient reports that this is due to her insulin and that when she uses more for her sliding scale, she gains weight. Breakfast - oatmeal with cinnamon, toast and coffee. Lunch - grilled chicken salad and water.Dinner - fish and hushpuppies.She typically drinks water or caffeine free drinks during the day. She does have a cup of coffee in the AM.She does not add salt to her food and tries to avoid processed foods.  Exercise: Was riding her exercise bike 1-2 times a week but has not done this since June due to foot problems and back pain. Also not walking as much due to cold weather.  Wt Readings from Last 3 Encounters:  11/14/14 227 lb (102.967 kg)  10/31/14 218 lb (98.884 kg)  10/17/14 227 lb (102.967 kg)   BP Readings from Last 3 Encounters:  10/31/14 165/64  10/17/14 164/70  10/03/14 183/69   Pulse Readings from Last 3 Encounters:  10/31/14 65  10/17/14 60  10/03/14 47    Renal function: CrCl cannot be calculated (Patient has no serum creatinine result on file.).  Past Medical History  Diagnosis Date  . Hypertension   . Diabetes mellitus without complication   . ALLERGIC RHINITIS   . ASTHMA   . Esophageal reflux   . SINUSITIS, ACUTE     Current Outpatient Prescriptions on File Prior to Visit  Medication Sig Dispense Refill  . acetaminophen (TYLENOL 8 HOUR ARTHRITIS PAIN) 650 MG CR tablet Take 650 mg by mouth every 8 (eight) hours as needed for pain.    Marland Kitchen. albuterol (PROVENTIL HFA;VENTOLIN HFA) 108 (90 BASE) MCG/ACT inhaler Inhale 2 puffs into the lungs every 6 (six) hours as needed for wheezing or shortness of breath.    Marland Kitchen. amLODipine (NORVASC) 10 MG tablet Take 1 tablet (10 mg total) by mouth daily. 30 tablet 1  . aspirin 81 MG tablet Take 81 mg by mouth daily.    . cloNIDine (CATAPRES) 0.2 MG tablet  Take 0.2 mg by mouth 2 (two) times daily.     Marland Kitchen. doxazosin (CARDURA) 4 MG tablet Take 4 mg by mouth at bedtime.    . Evolocumab (REPATHA) 140 MG/ML SOSY Inject 140 mg into the skin every 14 (fourteen) days.     Marland Kitchen. ezetimibe (ZETIA) 10 MG tablet Take 1 tablet (10 mg total) by mouth daily. 30 tablet 3  . fluticasone (FLONASE) 50 MCG/ACT nasal spray Place 1 spray into both nostrils daily as needed for allergies.     . Fluticasone-Salmeterol (ADVAIR) 100-50 MCG/DOSE AEPB Inhale 1 puff into the lungs as needed.    . furosemide (LASIX) 20 MG tablet Take 1 tablet (20 mg total) by mouth daily. 30 tablet 3  . glucose blood (ONETOUCH VERIO) test strip Use as instructed to check blood sugars 2 times per day, dx code 250.00 100 each 5  . hydrALAZINE (APRESOLINE) 50 MG tablet Take 1 tablet by mouth 3 times a day. 90 tablet 11  . insulin aspart (NOVOLOG FLEXPEN) 100 UNIT/ML FlexPen Inject 15 Units into the skin 2 (two) times daily. (Patient taking differently: Inject 15 Units into the skin 2 (two) times daily. ss) 15 mL 2  . Insulin Glargine (LANTUS) 100 UNIT/ML Solostar Pen Inject 34 Units into the skin daily at 10 pm. 15 mL 2  . irbesartan-hydrochlorothiazide (AVALIDE) 300-12.5 MG per tablet Take 0.5 tablets by mouth daily.    . Liraglutide (VICTOZA) 18 MG/3ML SOPN Inject 0.2 mLs (1.2 mg total) into the skin daily. Inject once daily at the same time 6 mL 2  . metFORMIN (GLUCOPHAGE) 1000 MG tablet Take 1 tablet (1,000 mg total) by mouth 2 (two) times daily with a meal. 60 tablet 2  . omeprazole (PRILOSEC) 20 MG capsule Take 20 mg by mouth daily.    . polyethylene glycol (MIRALAX / GLYCOLAX) packet Take 17 g by mouth daily as needed for moderate constipation.     . rosuvastatin (CRESTOR) 40 MG tablet Take 1 tablet (40 mg total) by mouth daily. 30 tablet 6  . spironolactone (ALDACTONE) 50 MG tablet Take 1 tablet (50 mg total) by mouth daily. 30 tablet 11   No current facility-administered medications on file prior  to visit.    Allergies  Allergen Reactions  . Ace Inhibitors Cough  . Shellfish Allergy Other (See Comments)    Pt cannot recall at this time     Assessment/Plan:  1. Hypertension - Patient's blood pressure is now at goal for the first time <150/3690mmHg after 4 consecutive BP visits. Current BP regimen is as follows:  AM: Irbesartan/HCTZ 300/12.5mg  daily Furosemide 20mg  daily Clonidine 0.2mg   Spironolactone  daily Hydralazine   Lunch: Hydralazine   PM: Amlodipine  daily Doxazosin  daily Clonidine 0.2mg  Hydralazine   No medication changes made at today's visit. Checked BMET today due to dose titration of spironolactone. Patient reports still taking K supplementation although this was discontinued about a month ago. BMET today - slight bump in SCr but overall stable, K wnl at 4.2. Left message for pt after labs back advising her to continue with current BP regimen. Provided patient with BP logs to monitor her BP, she will move her cuff to a room where she can remember to check her BP. Congratulated patient on improved BP control. Will f/u one final time in a month - would like to have more patient BP readings to confirm that BP remains at goal. Patient in agreement with plan.   Charisse Wendell E. Dossie Swor, PharmD [facility] [address] [place], [place] [address] Phone: [phone]; Fax: [phone] 11/14/2014 11:03 AM

## 2014-11-14 NOTE — Patient Instructions (Signed)
Keep up the great work - your blood pressure today was at goal at 148/78 and we do not need to make any medication changes. Please check your blood pressure at home - move your cuff into the room where you read in the evenings to help remind you. Write down your blood pressure readings and bring them in to your next visit. Follow up in blood pressure clinic in 1 month at November 10th at 10am.

## 2014-11-21 ENCOUNTER — Other Ambulatory Visit: Payer: Self-pay | Admitting: *Deleted

## 2014-11-21 MED ORDER — INSULIN ASPART 100 UNIT/ML FLEXPEN: 15.0000 [IU] | PEN_INJECTOR | Freq: Two times a day (BID) | SUBCUTANEOUS | Status: DC

## 2014-12-04 ENCOUNTER — Ambulatory Visit (INDEPENDENT_AMBULATORY_CARE_PROVIDER_SITE_OTHER): Payer: Medicare Other | Admitting: Podiatry

## 2014-12-04 ENCOUNTER — Encounter: Payer: Self-pay | Admitting: Podiatry

## 2014-12-04 DIAGNOSIS — B351 Tinea unguium: Secondary | ICD-10-CM

## 2014-12-04 DIAGNOSIS — M79676 Pain in unspecified toe(s): Secondary | ICD-10-CM | POA: Diagnosis not present

## 2014-12-04 NOTE — Patient Instructions (Signed)
Diabetes and Foot Care Diabetes may cause you to have problems because of poor blood supply (circulation) to your feet and legs. This may cause the skin on your feet to become thinner, break easier, and heal more slowly. Your skin may become dry, and the skin may peel and crack. You may also have nerve damage in your legs and feet causing decreased feeling in them. You may not notice minor injuries to your feet that could lead to infections or more serious problems. Taking care of your feet is one of the most important things you can do for yourself.  HOME CARE INSTRUCTIONS  Wear shoes at all times, even in the house. Do not go barefoot. Bare feet are easily injured.  Check your feet daily for blisters, cuts, and redness. If you cannot see the bottom of your feet, use a mirror or ask someone for help.  Wash your feet with warm water (do not use hot water) and mild soap. Then pat your feet and the areas between your toes until they are completely dry. Do not soak your feet as this can dry your skin.  Apply a moisturizing lotion or petroleum jelly (that does not contain alcohol and is unscented) to the skin on your feet and to dry, brittle toenails. Do not apply lotion between your toes.  Trim your toenails straight across. Do not dig under them or around the cuticle. File the edges of your nails with an emery board or nail file.  Do not cut corns or calluses or try to remove them with medicine.  Wear clean socks or stockings every day. Make sure they are not too tight. Do not wear knee-high stockings since they may decrease blood flow to your legs.  Wear shoes that fit properly and have enough cushioning. To break in new shoes, wear them for just a few hours a day. This prevents you from injuring your feet. Always look in your shoes before you put them on to be sure there are no objects inside.  Do not cross your legs. This may decrease the blood flow to your feet.  If you find a minor scrape,  cut, or break in the skin on your feet, keep it and the skin around it clean and dry. These areas may be cleansed with mild soap and water. Do not cleanse the area with peroxide, alcohol, or iodine.  When you remove an adhesive bandage, be sure not to damage the skin around it.  If you have a wound, look at it several times a day to make sure it is healing.  Do not use heating pads or hot water bottles. They may burn your skin. If you have lost feeling in your feet or legs, you may not know it is happening until it is too late.  Make sure your health care provider performs a complete foot exam at least annually or more often if you have foot problems. Report any cuts, sores, or bruises to your health care provider immediately. SEEK MEDICAL CARE IF:   You have an injury that is not healing.  You have cuts or breaks in the skin.  You have an ingrown nail.  You notice redness on your legs or feet.  You feel burning or tingling in your legs or feet.  You have pain or cramps in your legs and feet.  Your legs or feet are numb.  Your feet always feel cold. SEEK IMMEDIATE MEDICAL CARE IF:   There is increasing redness,   swelling, or pain in or around a wound.  There is a red line that goes up your leg.  Pus is coming from a wound.  You develop a fever or as directed by your health care provider.  You notice a bad smell coming from an ulcer or wound.   This information is not intended to replace advice given to you by your health care provider. Make sure you discuss any questions you have with your health care provider.   Document Released: 01/16/2000 Document Revised: 09/20/2012 Document Reviewed: 06/27/2012 Elsevier Interactive Patient Education 2016 [employer]  

## 2014-12-04 NOTE — Progress Notes (Signed)
Patient ID: Heidi Campbell, female   DOB: Apr 01, 1945, 69 y.o.   MRN: [id number]   subjective:  patient presents today complaining of painful toenails walking wearing shoes and is requesting toenail debridement   Objective:  no open skin lesions bilaterally  the toenails are hypertrophic, elongated, discolored , incurvated and tender to direct palpation 6-10   Assessment:  symptomatic onychomycoses 6-10    plan:  debridement toenails 10 mechanically and electronically without any bleeding    reappoint 3 months

## 2014-12-10 ENCOUNTER — Other Ambulatory Visit: Payer: Self-pay | Admitting: Endocrinology

## 2014-12-12 ENCOUNTER — Ambulatory Visit (INDEPENDENT_AMBULATORY_CARE_PROVIDER_SITE_OTHER): Payer: Medicare Other | Admitting: Pharmacist

## 2014-12-12 VITALS — BP 144/72 | HR 74 | Wt 226.0 lb

## 2014-12-12 DIAGNOSIS — I10 Essential (primary) hypertension: Secondary | ICD-10-CM

## 2014-12-12 NOTE — Patient Instructions (Signed)
Your blood pressure looked great today at 144/72. We will continue you on your current blood pressure medicines:  AM: Irbesartan/HCTZ 300/12.5mg  daily Furosemide 20mg  daily Clonidine 0.2mg   Spironolactone 50mg  daily Hydralazine 50mg   Lunch: Hydralazine 50mg   PM: Amlodipine 10mg  daily Doxazosin 4mg  daily Clonidine 0.2mg  Hydralazine 50mg 

## 2014-12-12 NOTE — Progress Notes (Signed)
Patient ID: Heidi Campbell                 DOB: 1945/10/09, 69 yo                         MRN: [id number]     HPI: Heidi Campbell is a 69 y.o. female patient of Dr. Eldridge Dace who returns to HTN clinic for 5th follow up over the past few months. PMH is significant for DM, CAD, HTN. Her BP was fairly well controlled but over the past year it has been elevated. There were no major differences in her medications over the past year. She went to the ED on 6/24 for elevated BP. Her BP was 268/113 and amlodipine  daily was added. It was still elevated at 202/100 on 7/8 and Cardura was added. Her BP on 7/14 had improved to 148/60. She was seen by Dr. Lucianne Muss when furosemide  daily + potassium daily were added d/t lower extremity swelling.  Her renal function had been elevated which limited her ability to start an aldosterone antagonist. However, recent BMET on 09/19/14 showed return to baseline of SCr 0.98, K stable at 4 since adding Lasix and potassium. During recent pharmacy HTN visits, spironolactone was started and titrated up to  daily. K supplement was discontinued, although patient today reports that she has been still taking some potassium but she does not know what dose.   Patient brought in the newer of 2 BP cuffs that she has at home. She occasionally checks her BP at home, but only when she feels bad. She reports that her highest reading was 162/84 and that her systolic reading usually ran in the 140s-150s. She saw her PCP last week because she had a headache that would not go away, and reports that her BP was fine there. She also brings in a sheet from a doctor visit on 10/24 and her BP was controlled at 146/86, pulse 62.   Checked home cuff BP against clinic reading. Home BP cuff reads much higher than clinic cuff - 179/86, pulse 84 vs clinic reading of 144/72, pulse 74.  Current HTN meds: AM: Irbesartan/HCTZ 300/12.5mg  daily Furosemide  daily Clonidine 0.2mg    Spironolactone  daily Hydralazine   Lunch: Hydralazine   PM: Amlodipine  daily Doxazosin  daily Clonidine 0.2mg  Hydralazine    BP goal: <150/26mmHg. 148/43mmHg today in clinic, pulse 53. Patient took all AM BP meds, also had a cup of coffee 1 and 1/2 hours ago.  FH: Father had died from MI at 20. Mother developed DM in her 67s.  SH: non smoker, no alcohol, no illicit drug use  Diet: Patient reports that she has completely cut out salt from her diet. She had lost 9 pounds at last visit 2 weeks ago, but weight back up to baseline today. Patient reports that this is due to her insulin and that when she uses more for her sliding scale, she gains weight. Breakfast - oatmeal with cinnamon, toast and coffee. Lunch - grilled chicken salad and water.Dinner - fish and hushpuppies.She typically drinks water or caffeine free drinks during the day. She does have a cup of coffee in the AM.She does not add salt to her food and tries to avoid processed foods.  Exercise: Was riding her exercise bike 1-2 times a week but has not done this since June due to foot problems and back pain. Also not walking as much due to cold weather.  Wt Readings from Last 3 Encounters:  12/12/14 226 lb (102.513 kg)  11/14/14 227 lb (102.967 kg)  10/31/14 218 lb (98.884 kg)   BP Readings from Last 3 Encounters:  11/14/14 148/78  10/31/14 165/64  10/17/14 164/70   Pulse Readings from Last 3 Encounters:  11/14/14 53  10/31/14 65  10/17/14 60    Renal function: CrCl cannot be calculated (Patient has no serum creatinine result on file.).  Past Medical History  Diagnosis Date  . Hypertension   . Diabetes mellitus without complication (HCC)   . ALLERGIC RHINITIS   . ASTHMA   . Esophageal reflux   . SINUSITIS, ACUTE     Current Outpatient Prescriptions on File Prior to Visit  Medication Sig Dispense Refill  . acetaminophen (TYLENOL 8 HOUR ARTHRITIS PAIN) 650 MG CR tablet Take  650 mg by mouth every 8 (eight) hours as needed for pain.    Marland Kitchen. albuterol (PROVENTIL HFA;VENTOLIN HFA) 108 (90 BASE) MCG/ACT inhaler Inhale 2 puffs into the lungs every 6 (six) hours as needed for wheezing or shortness of breath.    Marland Kitchen. amLODipine (NORVASC) 10 MG tablet Take 1 tablet (10 mg total) by mouth daily. 30 tablet 1  . aspirin 81 MG tablet Take 81 mg by mouth daily.    . cloNIDine (CATAPRES) 0.2 MG tablet Take 0.2 mg by mouth 2 (two) times daily.     Marland Kitchen. doxazosin (CARDURA) 4 MG tablet Take 4 mg by mouth at bedtime.    . Evolocumab (REPATHA) 140 MG/ML SOSY Inject 140 mg into the skin every 14 (fourteen) days.     Marland Kitchen. ezetimibe (ZETIA) 10 MG tablet Take 1 tablet (10 mg total) by mouth daily. 30 tablet 3  . fluticasone (FLONASE) 50 MCG/ACT nasal spray Place 1 spray into both nostrils daily as needed for allergies.     . Fluticasone-Salmeterol (ADVAIR) 100-50 MCG/DOSE AEPB Inhale 1 puff into the lungs as needed.    . furosemide (LASIX) 20 MG tablet Take 1 tablet (20 mg total) by mouth daily. 30 tablet 3  . hydrALAZINE (APRESOLINE) 50 MG tablet Take 1 tablet by mouth 3 times a day. 90 tablet 11  . insulin aspart (NOVOLOG FLEXPEN) 100 UNIT/ML FlexPen Inject 15 Units into the skin 2 (two) times daily. ss 15 mL 2  . Insulin Glargine (LANTUS) 100 UNIT/ML Solostar Pen Inject 34 Units into the skin daily at 10 pm. 15 mL 2  . irbesartan-hydrochlorothiazide (AVALIDE) 300-12.5 MG per tablet Take 0.5 tablets by mouth daily.    . Liraglutide (VICTOZA) 18 MG/3ML SOPN Inject 0.2 mLs (1.2 mg total) into the skin daily. Inject once daily at the same time 6 mL 2  . metFORMIN (GLUCOPHAGE) 1000 MG tablet Take 1 tablet (1,000 mg total) by mouth 2 (two) times daily with a meal. 60 tablet 2  . omeprazole (PRILOSEC) 20 MG capsule Take 20 mg by mouth daily.    Letta Pate. ONETOUCH VERIO test strip TEST BLOOD SUGAR twice a day 100 each 5  . polyethylene glycol (MIRALAX / GLYCOLAX) packet Take 17 g by mouth daily as needed for  moderate constipation.     . rosuvastatin (CRESTOR) 40 MG tablet Take 1 tablet (40 mg total) by mouth daily. 30 tablet 6  . spironolactone (ALDACTONE) 50 MG tablet Take 1 tablet (50 mg total) by mouth daily. 30 tablet 11   No current facility-administered medications on file prior to visit.    Allergies  Allergen Reactions  . Ace Inhibitors Cough  .  Shellfish Allergy Other (See Comments)    Pt cannot recall at this time     Assessment/Plan:  1. Hypertension - Patient remains at goal BP <150/69mmHg after being seen in BP clinic 5 times. After the 4th visit last month, her BP was at goal for the first time and it continues to remain at goal today. No medication changes were made after the last visit or today. Congratulated pt on her hard work in bringing her BP down since she was used to seeing systolic readings in the 200s. Of note, pt has 2 BP cuffs at home and one of them reads ~30 pts higher systolic than clinic reading. Advised her to monitor her BP at home using her older cuff that seems to be more accurate. Per pt request, she would like to follow up in BP clinic in 3 months to make sure she is still staying on track. Current BP regimen was printed out again for pt today and remains as follows:  AM: Irbesartan/HCTZ 300/12.5mg  daily Furosemide  daily Clonidine 0.2mg   Spironolactone  daily Hydralazine   Lunch: Hydralazine   PM: Amlodipine  daily Doxazosin  daily Clonidine 0.2mg  Hydralazine     Yaseen Gilberg E. Yuepheng Schaller, PharmD [facility] [address] [place], [place] [address] Phone: [phone]; Fax: [phone] 12/12/2014 2:59 PM

## 2014-12-23 ENCOUNTER — Other Ambulatory Visit: Payer: Self-pay | Admitting: Endocrinology

## 2015-01-16 ENCOUNTER — Other Ambulatory Visit: Payer: Self-pay | Admitting: Orthopedic Surgery

## 2015-01-16 DIAGNOSIS — M545 Low back pain: Secondary | ICD-10-CM

## 2015-01-21 ENCOUNTER — Telehealth: Payer: Self-pay | Admitting: Endocrinology

## 2015-01-21 NOTE — Telephone Encounter (Signed)
Bernarda CaffeyAdrian Peele need patient last A1C Phone # [phone] ex [phone]

## 2015-01-27 ENCOUNTER — Encounter (HOSPITAL_COMMUNITY): Payer: Self-pay | Admitting: Emergency Medicine

## 2015-01-27 ENCOUNTER — Emergency Department (HOSPITAL_COMMUNITY)
Admission: EM | Admit: 2015-01-27 | Discharge: 2015-01-27 | Disposition: A | Discharge status: Home / Self Care | Payer: Medicare Other | Attending: Emergency Medicine | Admitting: Emergency Medicine

## 2015-01-27 ENCOUNTER — Emergency Department (HOSPITAL_COMMUNITY): Payer: Medicare Other

## 2015-01-27 DIAGNOSIS — Z7951 Long term (current) use of inhaled steroids: Secondary | ICD-10-CM | POA: Diagnosis not present

## 2015-01-27 DIAGNOSIS — Y998 Other external cause status: Secondary | ICD-10-CM | POA: Insufficient documentation

## 2015-01-27 DIAGNOSIS — M545 Low back pain, unspecified: Secondary | ICD-10-CM

## 2015-01-27 DIAGNOSIS — Y9289 Other specified places as the place of occurrence of the external cause: Secondary | ICD-10-CM | POA: Insufficient documentation

## 2015-01-27 DIAGNOSIS — Z794 Long term (current) use of insulin: Secondary | ICD-10-CM | POA: Insufficient documentation

## 2015-01-27 DIAGNOSIS — K219 Gastro-esophageal reflux disease without esophagitis: Secondary | ICD-10-CM | POA: Insufficient documentation

## 2015-01-27 DIAGNOSIS — Z79899 Other long term (current) drug therapy: Secondary | ICD-10-CM | POA: Diagnosis not present

## 2015-01-27 DIAGNOSIS — W19XXXA Unspecified fall, initial encounter: Secondary | ICD-10-CM

## 2015-01-27 DIAGNOSIS — J45909 Unspecified asthma, uncomplicated: Secondary | ICD-10-CM | POA: Insufficient documentation

## 2015-01-27 DIAGNOSIS — I10 Essential (primary) hypertension: Secondary | ICD-10-CM | POA: Insufficient documentation

## 2015-01-27 DIAGNOSIS — Z7984 Long term (current) use of oral hypoglycemic drugs: Secondary | ICD-10-CM | POA: Diagnosis not present

## 2015-01-27 DIAGNOSIS — W010XXA Fall on same level from slipping, tripping and stumbling without subsequent striking against object, initial encounter: Secondary | ICD-10-CM | POA: Insufficient documentation

## 2015-01-27 DIAGNOSIS — Z7982 Long term (current) use of aspirin: Secondary | ICD-10-CM | POA: Insufficient documentation

## 2015-01-27 DIAGNOSIS — S3992XA Unspecified injury of lower back, initial encounter: Secondary | ICD-10-CM | POA: Insufficient documentation

## 2015-01-27 DIAGNOSIS — Y9389 Activity, other specified: Secondary | ICD-10-CM | POA: Insufficient documentation

## 2015-01-27 DIAGNOSIS — G8929 Other chronic pain: Secondary | ICD-10-CM | POA: Insufficient documentation

## 2015-01-27 DIAGNOSIS — E119 Type 2 diabetes mellitus without complications: Secondary | ICD-10-CM | POA: Insufficient documentation

## 2015-01-27 MED ORDER — OXYCODONE-ACETAMINOPHEN 5-325 MG PO TABS
1.0000 | ORAL_TABLET | Freq: Once | ORAL | Status: AC
  Administered 2015-01-27: 1 via ORAL
  Filled 2015-01-27: qty 1

## 2015-01-27 MED ORDER — METHOCARBAMOL 500 MG PO TABS: 500.0000 mg | ORAL_TABLET | Freq: Two times a day (BID) | ORAL | Status: DC

## 2015-01-27 MED ORDER — OXYCODONE-ACETAMINOPHEN 5-325 MG PO TABS: 2.0000 | ORAL_TABLET | ORAL | Status: DC | PRN

## 2015-01-27 MED ORDER — METHOCARBAMOL 500 MG PO TABS
500.0000 mg | ORAL_TABLET | Freq: Once | ORAL | Status: AC
Start: 2015-01-27 — End: 2015-01-27
  Administered 2015-01-27: 500 mg via ORAL
  Filled 2015-01-27: qty 1

## 2015-01-27 MED ORDER — KETOROLAC TROMETHAMINE 60 MG/2ML IM SOLN
30.0000 mg | Freq: Once | INTRAMUSCULAR | Status: AC
  Administered 2015-01-27: 30 mg via INTRAMUSCULAR
  Filled 2015-01-27: qty 2

## 2015-01-27 MED ORDER — ONDANSETRON 4 MG PO TBDP: 4.0000 mg | ORAL_TABLET | Freq: Three times a day (TID) | ORAL | Status: AC | PRN

## 2015-01-27 NOTE — Progress Notes (Addendum)
Pt states she tripped on a christmas tree chord and now has pain in her left hip-Left leg is not externally rotated. Family is a t bedside with the pt. Pt denies any blood in her urine. Pt states she is scheduled for a MRI on Thursday. Pt rates her pain a 10/10. 5:30pm _pt taken to the x-ray dept. Pt s/p lumbar surgery in 2001. No radiation of the pain down either leg. No loss of control of bowel or bladder. 5:50pm _pt returned from the x-ray dept. Tolerated well.

## 2015-01-27 NOTE — ED Notes (Signed)
Pt complaint of acute chronic lower back pain related to fall unplugging Christmas tree. Ambulatory in triage. Hx of chronic lower back pain; MRI scheduled on Wednesday.

## 2015-01-27 NOTE — ED Provider Notes (Signed)
CSN: [id number]     Arrival date & time 01/27/15  1536 History  By signing my name below, I, Heidi Campbell, attest that this documentation has been prepared under the direction and in the presence of Heidi Holzworth, PA-C. Electronically Signed: Irene Campbell, ED Scribe. 01/27/2015. 4:55 PM.   Chief Complaint  Patient presents with  . Back Pain   The history is provided by the patient. No language interpreter was used.  HPI Comments: Heidi Campbell is a 69 y.o. Female with a hx of HTN, DM, and chronic back pain who presents to the Emergency Department complaining of gradually worsening, throbbing left lower back pain that she rates 10/10 onset one day ago. Pt reports falling while trying to unplug her christmas tree, landing on her right side. She reports worsening pain with standing. Pt has been taking Naprosyn for pain to no relief; she states that she has been unable to keep it down because she will vomit when she takes it, but states that this has been an ongoing problem. Pt reports that she has been having many back problems recently and is scheduled for an MRI this week, ordered by her PCP. She denies fever, chills, extremity pain, hitting head, syncope, numbness, weakness, shortness of breath, chest pain, or any other pain or complaints.   Past Medical History  Diagnosis Date  . Hypertension   . Diabetes mellitus without complication ([place])   . ALLERGIC RHINITIS   . ASTHMA   . Esophageal reflux   . SINUSITIS, ACUTE    Past Surgical History  Procedure Laterality Date  . Abdominal hysterectomy    . Breast surgery     Family History  Problem Relation Age of Onset  . Diabetes Mother   . Heart attack Father   . Heart disease Father    Social History  Substance Use Topics  . Smoking status: Never Smoker   . Smokeless tobacco: None  . Alcohol Use: No   OB History    No data available     Review of Systems  Constitutional: Negative for fever and chills.  Musculoskeletal:  Positive for back pain. Negative for arthralgias, neck pain and neck stiffness.  Neurological: Negative for dizziness, syncope, weakness, light-headedness, numbness and headaches.  All other systems reviewed and are negative.  Allergies  Ace inhibitors and Shellfish allergy  Home Medications   Prior to Admission medications   Medication Sig Start Date End Date Taking? Authorizing Provider  acetaminophen (TYLENOL 8 HOUR ARTHRITIS PAIN) 650 MG CR tablet Take 650 mg by mouth every 8 (eight) hours as needed for pain.    Historical Provider, MD  albuterol (PROVENTIL HFA;VENTOLIN HFA) 108 (90 BASE) MCG/ACT inhaler Inhale 2 puffs into the lungs every 6 (six) hours as needed for wheezing or shortness of breath.    Historical Provider, MD  amLODipine (NORVASC) 10 MG tablet Take 1 tablet (10 mg total) by mouth daily. 07/26/14   [name], MD  aspirin 81 MG tablet Take 81 mg by mouth daily.    Historical Provider, MD  cloNIDine (CATAPRES) 0.2 MG tablet Take 0.2 mg by mouth 2 (two) times daily.     Historical Provider, MD  doxazosin (CARDURA) 4 MG tablet Take 4 mg by mouth at bedtime.    Historical Provider, MD  doxazosin (CARDURA) 4 MG tablet take 1/2 tablet by mouth at bedtime ON DAY 1 AND 2, THEN 1 TABLET AT BEDTIME 12/23/14   [name], MD  Evolocumab (REPATHA) 140 MG/ML SOSY  Inject 140 mg into the skin every 14 (fourteen) days.     Historical Provider, MD  ezetimibe (ZETIA) 10 MG tablet Take 1 tablet (10 mg total) by mouth daily. 07/20/13   [name], MD  fluticasone (FLONASE) 50 MCG/ACT nasal spray Place 1 spray into both nostrils daily as needed for allergies.     Historical Provider, MD  Fluticasone-Salmeterol (ADVAIR) 100-50 MCG/DOSE AEPB Inhale 1 puff into the lungs as needed.    Historical Provider, MD  furosemide (LASIX) 20 MG tablet Take 1 tablet (20 mg total) by mouth daily. 08/30/14   [name], MD  hydrALAZINE (APRESOLINE) 50 MG tablet Take 1 tablet by mouth 3 times a day. 10/17/14    [name], MD  insulin aspart (NOVOLOG FLEXPEN) 100 UNIT/ML FlexPen Inject 15 Units into the skin 2 (two) times daily. ss 11/21/14   [name], MD  Insulin Glargine (LANTUS) 100 UNIT/ML Solostar Pen Inject 34 Units into the skin daily at 10 pm. 08/19/14   [name], MD  irbesartan-hydrochlorothiazide (AVALIDE) 300-12.5 MG per tablet Take 0.5 tablets by mouth daily.    Historical Provider, MD  Liraglutide (VICTOZA) 18 MG/3ML SOPN Inject 0.2 mLs (1.2 mg total) into the skin daily. Inject once daily at the same time 03/26/14   [name], MD  metFORMIN (GLUCOPHAGE) 1000 MG tablet Take 1 tablet (1,000 mg total) by mouth 2 (two) times daily with a meal. 03/21/14   [name], MD  methocarbamol (ROBAXIN) 500 MG tablet Take 1 tablet (500 mg total) by mouth 2 (two) times daily. 01/27/15   Chanon Loney C Miken Stecher, PA-C  omeprazole (PRILOSEC) 20 MG capsule Take 20 mg by mouth daily.    Historical Provider, MD  ondansetron (ZOFRAN ODT) 4 MG disintegrating tablet Take 1 tablet (4 mg total) by mouth every 8 (eight) hours as needed for nausea or vomiting. 01/27/15   [name] C Zaveon Gillen, PA-C  ONETOUCH VERIO test strip TEST BLOOD SUGAR twice a day 12/10/14   [name], MD  oxyCODONE-acetaminophen (PERCOCET/ROXICET) 5-325 MG tablet Take 2 tablets by mouth every 4 (four) hours as needed for severe pain. 01/27/15   Mayo Owczarzak C [name], PA-C  polyethylene glycol (MIRALAX / GLYCOLAX) packet Take 17 g by mouth daily as needed for moderate constipation.     Historical Provider, MD  rosuvastatin (CRESTOR) 40 MG tablet Take 1 tablet (40 mg total) by mouth daily. 12/22/12   [name], MD  spironolactone (ALDACTONE) 50 MG tablet Take 1 tablet (50 mg total) by mouth daily. 10/31/14   [name], MD   BP 170/64 mmHg  Pulse 70  Temp(Src) 98.6 F (37 C) (Oral)  Resp 16  SpO2 97% Physical Exam  Constitutional: She is oriented to person, place, and time. She appears well-developed and well-nourished.  HENT:  Head:  Normocephalic and atraumatic.  Eyes: Conjunctivae and EOM are normal. Pupils are equal, round, and reactive to light.  Neck: Normal range of motion. Neck supple.  Cardiovascular: Normal rate, regular rhythm and intact distal pulses.   Pulmonary/Chest: Effort normal and breath sounds normal.  Abdominal: Soft. There is no tenderness.  Musculoskeletal: Normal range of motion.  Full ROM in all extremities and spine. No paraspinal tenderness. Tenderness to left lumbar musculature.  Neurological: She is alert and oriented to person, place, and time. She has normal reflexes.  No sensory deficits. Strength 5/5 in all extremities. No gait disturbance. Coordination intact.  Skin: Skin is warm and dry.  Psychiatric: She has a normal  mood and affect. Her behavior is normal.  Nursing note and vitals reviewed.   ED Course  Procedures (including critical care time) DIAGNOSTIC STUDIES: Oxygen Saturation is 100% on RA, normal by my interpretation.    COORDINATION OF CARE: 4:54 PM-Discussed treatment plan which includes Toradol with pt at bedside and pt agreed to plan.     Imaging Review Dg Lumbar Spine Complete  01/27/2015  CLINICAL DATA:  Left lower back pain for 2 days.  History fall. EXAM: LUMBAR SPINE - COMPLETE 4+ VIEW COMPARISON:  None. FINDINGS: There is no evidence of lumbar spine fracture. Alignment is normal. There are multilevel osteoarthritic changes with joint space narrowing, endplate sclerosis and mild remodeling of the vertebral bodies. Posterior facet arthropathy particular in the lower lumbosacral spine is also seen. Vascular calcifications are seen. IMPRESSION: No evidence of acute fracture of the lumbosacral spine. 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 Electronically Signed   By: Fidela Salisbury M.D.   On: 01/27/2015 18:07   I have personally reviewed and evaluated these images as part of my medical decision-making.   EKG  Interpretation None      MDM   Final diagnoses:  Left-sided low back pain without sciatica  Fall, initial encounter     Brionne V Sukhu presents with acute on chronic lower back pain following a fall yesterday.  Findings and plan of care discussed with Charlesetta Shanks, MD.  Patient's presentation is consistent with muscle strain. Patient has no neurologic deficits, is able to ambulate, and has no red flag symptoms. Imaging is not indicated at this time. Patient is encouraged to keep her MRI appointment this week. When we attempted to give patient Percocet for pain relief, she states that she can't take Percocet because it makes her throw up. Patient specifically requested an x-ray of her back, although no specific indications were met. Lumbar spine x-ray negative for any acute changes. Patient was given conservative management here in the ED. Patient was also given instructions for home care and return precautions. Patient voiced understanding of these instructions, accepts the plan, and is comfortable with discharge.  I personally performed the services described in this documentation, which was scribed in my presence. The recorded information has been reviewed and is accurate.    Lorayne Bender, PA-C 01/27/15 2021  Milton Ferguson, MD 02/21/15 [phone]

## 2015-01-27 NOTE — Discharge Instructions (Signed)
You have been seen today for back pain. Your imaging showed no acute abnormalities. Follow up with PCP as needed for chronic management of this issue. Return to ED should symptoms worsen. Be sure to keep your appointment for your MRI.

## 2015-01-30 ENCOUNTER — Ambulatory Visit
Admission: RE | Admit: 2015-01-30 | Discharge: 2015-01-30 | Disposition: A | Discharge status: Home / Self Care | Payer: Medicare Other | Source: Ambulatory Visit | Attending: Orthopedic Surgery | Admitting: Orthopedic Surgery

## 2015-01-30 DIAGNOSIS — M545 Low back pain: Secondary | ICD-10-CM

## 2015-02-20 ENCOUNTER — Other Ambulatory Visit (INDEPENDENT_AMBULATORY_CARE_PROVIDER_SITE_OTHER): Payer: Medicare Other

## 2015-02-20 DIAGNOSIS — E1165 Type 2 diabetes mellitus with hyperglycemia: Secondary | ICD-10-CM

## 2015-02-20 DIAGNOSIS — IMO0002 Reserved for concepts with insufficient information to code with codable children: Secondary | ICD-10-CM

## 2015-02-20 LAB — COMPREHENSIVE METABOLIC PANEL
ALK PHOS: 63 U/L (ref 39–117)
ALT: 17 U/L (ref 0–35)
AST: 24 U/L (ref 0–37)
Albumin: 3.9 g/dL (ref 3.5–5.2)
BILIRUBIN TOTAL: 0.9 mg/dL (ref 0.2–1.2)
BUN: 16 mg/dL (ref 6–23)
CO2: 29 mEq/L (ref 19–32)
Calcium: 9.4 mg/dL (ref 8.4–10.5)
Chloride: 103 mEq/L (ref 96–112)
Creatinine, Ser: 1.21 mg/dL — ABNORMAL HIGH (ref 0.40–1.20)
GFR: 56.59 mL/min — ABNORMAL LOW (ref >60.00)
GLUCOSE: 140 mg/dL — AB (ref 70–99)
Potassium: 3.8 mEq/L (ref 3.5–5.1)
SODIUM: 138 meq/L (ref 135–145)
TOTAL PROTEIN: 7 g/dL (ref 6.0–8.3)

## 2015-02-21 ENCOUNTER — Other Ambulatory Visit (INDEPENDENT_AMBULATORY_CARE_PROVIDER_SITE_OTHER): Payer: Medicare Other

## 2015-02-21 DIAGNOSIS — E119 Type 2 diabetes mellitus without complications: Secondary | ICD-10-CM | POA: Diagnosis not present

## 2015-02-21 LAB — LIPID PANEL
CHOL/HDL RATIO: 5
Cholesterol: 240 mg/dL — ABNORMAL HIGH (ref 0–200)
HDL: 51.1 mg/dL (ref >39.00)
LDL CALC: 162 mg/dL — AB (ref 0–99)
NonHDL: 188.66
Triglycerides: 133 mg/dL (ref 0.0–149.0)
VLDL: 26.6 mg/dL (ref 0.0–40.0)

## 2015-02-22 ENCOUNTER — Other Ambulatory Visit: Payer: Self-pay | Admitting: Endocrinology

## 2015-02-24 ENCOUNTER — Ambulatory Visit: Payer: Medicare Other | Admitting: Endocrinology

## 2015-03-06 ENCOUNTER — Ambulatory Visit: Payer: Medicare Other | Admitting: Endocrinology

## 2015-03-12 ENCOUNTER — Ambulatory Visit (INDEPENDENT_AMBULATORY_CARE_PROVIDER_SITE_OTHER): Payer: Medicare Other | Admitting: Podiatry

## 2015-03-12 ENCOUNTER — Encounter: Payer: Self-pay | Admitting: Podiatry

## 2015-03-12 DIAGNOSIS — B351 Tinea unguium: Secondary | ICD-10-CM | POA: Diagnosis not present

## 2015-03-12 DIAGNOSIS — M79676 Pain in unspecified toe(s): Secondary | ICD-10-CM

## 2015-03-12 NOTE — Patient Instructions (Signed)
Diabetes and Foot Care Diabetes may cause you to have problems because of poor blood supply (circulation) to your feet and legs. This may cause the skin on your feet to become thinner, break easier, and heal more slowly. Your skin may become dry, and the skin may peel and crack. You may also have nerve damage in your legs and feet causing decreased feeling in them. You may not notice minor injuries to your feet that could lead to infections or more serious problems. Taking care of your feet is one of the most important things you can do for yourself.  HOME CARE INSTRUCTIONS  Wear shoes at all times, even in the house. Do not go barefoot. Bare feet are easily injured.  Check your feet daily for blisters, cuts, and redness. If you cannot see the bottom of your feet, use a mirror or ask someone for help.  Wash your feet with warm water (do not use hot water) and mild soap. Then pat your feet and the areas between your toes until they are completely dry. Do not soak your feet as this can dry your skin.  Apply a moisturizing lotion or petroleum jelly (that does not contain alcohol and is unscented) to the skin on your feet and to dry, brittle toenails. Do not apply lotion between your toes.  Trim your toenails straight across. Do not dig under them or around the cuticle. File the edges of your nails with an emery board or nail file.  Do not cut corns or calluses or try to remove them with medicine.  Wear clean socks or stockings every day. Make sure they are not too tight. Do not wear knee-high stockings since they may decrease blood flow to your legs.  Wear shoes that fit properly and have enough cushioning. To break in new shoes, wear them for just a few hours a day. This prevents you from injuring your feet. Always look in your shoes before you put them on to be sure there are no objects inside.  Do not cross your legs. This may decrease the blood flow to your feet.  If you find a minor scrape,  cut, or break in the skin on your feet, keep it and the skin around it clean and dry. These areas may be cleansed with mild soap and water. Do not cleanse the area with peroxide, alcohol, or iodine.  When you remove an adhesive bandage, be sure not to damage the skin around it.  If you have a wound, look at it several times a day to make sure it is healing.  Do not use heating pads or hot water bottles. They may burn your skin. If you have lost feeling in your feet or legs, you may not know it is happening until it is too late.  Make sure your health care provider performs a complete foot exam at least annually or more often if you have foot problems. Report any cuts, sores, or bruises to your health care provider immediately. SEEK MEDICAL CARE IF:   You have an injury that is not healing.  You have cuts or breaks in the skin.  You have an ingrown nail.  You notice redness on your legs or feet.  You feel burning or tingling in your legs or feet.  You have pain or cramps in your legs and feet.  Your legs or feet are numb.  Your feet always feel cold. SEEK IMMEDIATE MEDICAL CARE IF:   There is increasing redness,   swelling, or pain in or around a wound.  There is a red line that goes up your leg.  Pus is coming from a wound.  You develop a fever or as directed by your health care provider.  You notice a bad smell coming from an ulcer or wound.   This information is not intended to replace advice given to you by your health care provider. Make sure you discuss any questions you have with your health care provider.   Document Released: 01/16/2000 Document Revised: 09/20/2012 Document Reviewed: 06/27/2012 Elsevier Interactive Patient Education 2016 [employer]  

## 2015-03-13 ENCOUNTER — Ambulatory Visit (INDEPENDENT_AMBULATORY_CARE_PROVIDER_SITE_OTHER): Payer: Medicare Other | Admitting: Pharmacist

## 2015-03-13 VITALS — BP 142/78 | HR 57

## 2015-03-13 DIAGNOSIS — I10 Essential (primary) hypertension: Secondary | ICD-10-CM

## 2015-03-13 NOTE — Progress Notes (Signed)
Patient ID: Heidi Campbell, female   DOB: 03-Aug-1945, 70 y.o.   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

## 2015-03-13 NOTE — Progress Notes (Signed)
Patient ID: Heidi DILLIN                  DOB: 1945/06/08                          MRN: [id number]     HPI: Heidi Campbell is a 70 y.o. female of Dr. Eldridge Dace who returns to HTN clinic for follow up. PMH is significant for DM, CAD, HTN. Her BP was fairly well controlled but over the past year it has been elevated. There were no major differences in her medications over the past year. She went to the ED on 6/24 for elevated BP. Her BP was 268/113 and amlodipine 10mg  daily was added. It was still elevated at 202/100 on 7/8 and Cardura was added. Her BP on 7/14 had improved to 148/60. She was seen by Dr. Lucianne Muss when furosemide 20mg  daily + potassium daily were added d/t lower extremity swelling.  Her renal function had been elevated which limited her ability to start an aldosterone antagonist. However, recent BMET on 09/19/14 showed return to baseline of SCr 0.98, K stable at 4 since adding Lasix and potassium. During recent pharmacy HTN visits, spironolactone was started and titrated up to 50mg  daily. K supplement was discontinued.  Pt reports today that she is running low on her doxazosin and she only has a few tablets left. Advised her to contact her PCP Dr. Lucianne Muss since he prescribed this and wanted to see pt before sending over refills. She reports that she has a visit with him this month. Since she will run out of doxazosin before then, will have her contact him for refill.  Current HTN meds:  AM: Irbesartan/HCTZ 300/12.5mg  daily Furosemide 20mg  daily Clonidine 0.2mg   Spironolactone 50mg  daily Hydralazine 50mg   Lunch: Hydralazine 50mg   PM: Amlodipine 10mg  daily Doxazosin 4mg  daily Clonidine 0.2mg  Hydralazine 50mg    BP goal: <150/35mmHg. 142/57mmHg today in clinic, pulse 57  Family History: Father had died from MI at 40. Mother developed DM in her 57s  Social History: non smoker, no alcohol, no illicit drug use  Diet: Patient reports that she has completely cut out  salt from her diet. She is trying to lose weight, but her insulin makes it hard to do so. Breakfast - oatmeal with cinnamon, toast and coffee. Lunch - grilled chicken salad and water.Dinner - fish and hushpuppies.She typically drinks water or caffeine free drinks during the day. She does have a cup of coffee in the AM.She does not add salt to her food and tries to avoid processed foods  Exercise: Was riding her exercise bike 1-2 times a week but has not done this since June due to foot problems and back pain. Also not walking as much due to cold weather.  Home BP readings: She reports that her highest reading was 170/80 and that her systolic readings usually ran in the 140s-150s.  Wt Readings from Last 3 Encounters:  12/12/14 226 lb (102.513 kg)  11/14/14 227 lb (102.967 kg)  10/31/14 218 lb (98.884 kg)   BP Readings from Last 3 Encounters:  01/27/15 170/64  12/12/14 144/72  11/14/14 148/78   Pulse Readings from Last 3 Encounters:  01/27/15 70  12/12/14 74  11/14/14 53    Renal function: CrCl cannot be calculated (Unknown ideal weight.).  Past Medical History  Diagnosis Date  . Hypertension   . Diabetes mellitus without complication ([facility])   . ALLERGIC RHINITIS   .  ASTHMA   . Esophageal reflux   . SINUSITIS, ACUTE     Current Outpatient Prescriptions on File Prior to Visit  Medication Sig Dispense Refill  . acetaminophen (TYLENOL 8 HOUR ARTHRITIS PAIN) 650 MG CR tablet Take 650 mg by mouth every 8 (eight) hours as needed for pain.    Marland Kitchen albuterol (PROVENTIL HFA;VENTOLIN HFA) 108 (90 BASE) MCG/ACT inhaler Inhale 2 puffs into the lungs every 6 (six) hours as needed for wheezing or shortness of breath.    Marland Kitchen amLODipine (NORVASC) 10 MG tablet Take 1 tablet (10 mg total) by mouth daily. 30 tablet 1  . aspirin 81 MG tablet Take 81 mg by mouth daily.    . cloNIDine (CATAPRES) 0.2 MG tablet Take 0.2 mg by mouth 2 (two) times daily.     Marland Kitchen doxazosin (CARDURA) 4 MG tablet Take 4 mg by  mouth at bedtime.    Marland Kitchen doxazosin (CARDURA) 4 MG tablet take 1/2 tablet by mouth at bedtime ON DAY 1 AND 2, THEN 1 TABLET AT BEDTIME 30 tablet 0  . Evolocumab (REPATHA) 140 MG/ML SOSY Inject 140 mg into the skin every 14 (fourteen) days.     Marland Kitchen ezetimibe (ZETIA) 10 MG tablet Take 1 tablet (10 mg total) by mouth daily. 30 tablet 3  . fluticasone (FLONASE) 50 MCG/ACT nasal spray Place 1 spray into both nostrils daily as needed for allergies.     . Fluticasone-Salmeterol (ADVAIR) 100-50 MCG/DOSE AEPB Inhale 1 puff into the lungs as needed.    . furosemide (LASIX) 20 MG tablet Take 1 tablet (20 mg total) by mouth daily. 30 tablet 3  . hydrALAZINE (APRESOLINE) 50 MG tablet Take 1 tablet by mouth 3 times a day. 90 tablet 11  . insulin aspart (NOVOLOG FLEXPEN) 100 UNIT/ML FlexPen Inject 15 Units into the skin 2 (two) times daily. ss 15 mL 2  . Insulin Glargine (LANTUS) 100 UNIT/ML Solostar Pen Inject 34 Units into the skin daily at 10 pm. 15 mL 2  . irbesartan-hydrochlorothiazide (AVALIDE) 300-12.5 MG per tablet Take 0.5 tablets by mouth daily.    . metFORMIN (GLUCOPHAGE) 1000 MG tablet Take 1 tablet (1,000 mg total) by mouth 2 (two) times daily with a meal. 60 tablet 2  . methocarbamol (ROBAXIN) 500 MG tablet Take 1 tablet (500 mg total) by mouth 2 (two) times daily. 20 tablet 0  . omeprazole (PRILOSEC) 20 MG capsule Take 20 mg by mouth daily.    . ondansetron (ZOFRAN ODT) 4 MG disintegrating tablet Take 1 tablet (4 mg total) by mouth every 8 (eight) hours as needed for nausea or vomiting. 20 tablet 0  . ONETOUCH VERIO test strip TEST BLOOD SUGAR twice a day 100 each 5  . oxyCODONE-acetaminophen (PERCOCET/ROXICET) 5-325 MG tablet Take 2 tablets by mouth every 4 (four) hours as needed for severe pain. 6 tablet 0  . polyethylene glycol (MIRALAX / GLYCOLAX) packet Take 17 g by mouth daily as needed for moderate constipation.     . rosuvastatin (CRESTOR) 40 MG tablet Take 1 tablet (40 mg total) by mouth daily.  30 tablet 6  . spironolactone (ALDACTONE) 50 MG tablet Take 1 tablet (50 mg total) by mouth daily. 30 tablet 11  . VICTOZA 18 MG/3ML SOPN inject 1.2 milligram subcutaneously daily 6 mL 0   No current facility-administered medications on file prior to visit.    Allergies  Allergen Reactions  . Ace Inhibitors Cough  . Shellfish Allergy Other (See Comments)    Pt cannot  recall at this time     Assessment/Plan:  1. Hypertension - Patient remains at goal BP <150/71mmHg for the 3rd consecutive visit with BP clinic. No medication changes were made after the last visit or today. Congratulated pt on her hard work in bringing her BP down since she was used to seeing systolic readings in the 200s. Advised her to monitor her BP at home using her older cuff that seems to be more accurate (has a 2nd one that reads ~30 pts higher). She will contact Dr. Lucianne Muss for refill of doxazosin. Pt originally wanted to come in today to make sure her BP stayed at goal, which it has. Will not need further f/u with BP clinic.     Zulema Pulaski E. Shanyah Gattuso, PharmD, CPP [facility] [address] [place], [place] [address] Phone: [phone]; Fax: [phone] 03/13/2015 11:26 AM

## 2015-03-17 ENCOUNTER — Telehealth: Payer: Self-pay | Admitting: Endocrinology

## 2015-03-19 NOTE — Telephone Encounter (Signed)
error 

## 2015-03-25 ENCOUNTER — Other Ambulatory Visit: Payer: Self-pay | Admitting: *Deleted

## 2015-03-25 ENCOUNTER — Ambulatory Visit (INDEPENDENT_AMBULATORY_CARE_PROVIDER_SITE_OTHER): Payer: Medicare Other | Admitting: Endocrinology

## 2015-03-25 ENCOUNTER — Encounter: Payer: Self-pay | Admitting: Endocrinology

## 2015-03-25 VITALS — BP 142/84 | HR 72 | Temp 97.9°F | Resp 16 | Ht 62.0 in | Wt 232.2 lb

## 2015-03-25 DIAGNOSIS — Z794 Long term (current) use of insulin: Secondary | ICD-10-CM | POA: Diagnosis not present

## 2015-03-25 DIAGNOSIS — E1165 Type 2 diabetes mellitus with hyperglycemia: Secondary | ICD-10-CM | POA: Diagnosis not present

## 2015-03-25 DIAGNOSIS — E782 Mixed hyperlipidemia: Secondary | ICD-10-CM | POA: Diagnosis not present

## 2015-03-25 LAB — POCT GLYCOSYLATED HEMOGLOBIN (HGB A1C): Hemoglobin A1C: 8.2

## 2015-03-25 MED ORDER — DOXAZOSIN MESYLATE 4 MG PO TABS: ORAL_TABLET | ORAL | Status: DC

## 2015-03-25 MED ORDER — INSULIN PEN NEEDLE 32G X 4 MM MISC: Status: DC

## 2015-03-25 MED ORDER — METFORMIN HCL 1000 MG PO TABS: 1000.0000 mg | ORAL_TABLET | Freq: Two times a day (BID) | ORAL | Status: DC

## 2015-03-25 MED ORDER — ONETOUCH DELICA LANCETS 33G MISC: Status: DC

## 2015-03-25 NOTE — Progress Notes (Signed)
Patient ID: Heidi Campbell, female   DOB: [birth year], 70 y.o.   MRN: [id number]   Reason for Appointment: Diabetes follow-up   History of Present Illness   Diagnosis: Type 2 DIABETES MELITUS, date of diagnosis: 1998      Previous history: She was initially treated with oral hypoglycemic drugs but switched to insulin when she was hospitalized for asthma in 2007. She has been on metformin but only 500 mg twice a day Over most of her diabetes history has been taking premixed insulin, NovoLog mix 70/30, twice a day Although her A1c in the past has been close to 7% she would tend to have hyperglycemia at 11 AM or overnight with his insulin She was given Victoza in 6/15 because of persistent poor control with her insulin regimen  Recent history:   Insulin regimen:  34 Lantus at bedtime and 24 units in a.m. Novolog 2x daily, 22 units + extra for high readings  She had not been seen in follow-up since 7/16 Her A1c is still significantly high at 8.2 She has been back on Victoza and metformin  Current blood sugar patterns and problems identified:  She is checking her blood sugars mostly at breakfast and bedtime and did not bring her monitor as directed  She thinks her blood sugars are fairly good in the morning although she calls them low when they are about 130.  She thinks her blood sugars are generally around 180 at lunch or bedtime  Despite instructions she is not taking any Novolog at breakfast  Her breakfast is sometimes high carbohydrate with oatmeal and toast or applesauce  She does not exercise  She is complaining about the cost of her insulin  She has gained weight  Oral hypoglycemic drugs:  Metformin ER 2000 mg         Side effects from medications:  occasional diarrhea from metformin  Proper timing of medications in relation to meals: Yes         Monitors blood glucose:   twice a day     Glucometer:   One Touch Verio.          Blood Glucose readings as  below by recall:  Mean values apply above for all meters except median for One Touch  PRE-MEAL Fasting Lunch Dinner Bedtime Overall  Glucose range: 131. 106 185 ? 187   Mean/median:          Meals:  breakfast is oatmeal or egg and toast . Supper at 7 pm     Physical activity: exercise: None recently, complaining of back pain  Dietician visit: Most recent: 1998 ?     Weight control:  Wt Readings from Last 3 Encounters:  03/25/15 232 lb 3.2 oz (105.325 kg)  12/12/14 226 lb (102.513 kg)  11/14/14 227 lb (102.967 kg)        Diabetes labs:  Lab Results  Component Value Date   HGBA1C 8.2 03/25/2015   HGBA1C 12.6* 08/06/2014   HGBA1C 7.6* 02/04/2014   Lab Results  Component Value Date   MICROALBUR 6.9* 07/03/2013   LDLCALC 162* 02/21/2015   CREATININE 1.21* 02/20/2015      HYPERTENSION and hypercholesterolemia: See Review of systems      Medication List       This list is accurate as of: 03/25/15 10:19 AM.  Always use your most recent med list.               albuterol 108 (90 Base)  MCG/ACT inhaler  Commonly known as:  PROVENTIL HFA;VENTOLIN HFA  Inhale 2 puffs into the lungs every 6 (six) hours as needed for wheezing or shortness of breath.     amLODipine 10 MG tablet  Commonly known as:  NORVASC  Take 1 tablet (10 mg total) by mouth daily.     aspirin 81 MG tablet  Take 81 mg by mouth daily.     cloNIDine 0.2 MG tablet  Commonly known as:  CATAPRES  Take 0.2 mg by mouth 2 (two) times daily.     doxazosin 4 MG tablet  Commonly known as:  CARDURA  take 1/2 tablet by mouth at bedtime ON DAY 1 AND 2, THEN 1 TABLET AT BEDTIME     ezetimibe 10 MG tablet  Commonly known as:  ZETIA  Take 1 tablet (10 mg total) by mouth daily.     fluticasone 50 MCG/ACT nasal spray  Commonly known as:  FLONASE  Place 1 spray into both nostrils daily as needed for allergies.     Fluticasone-Salmeterol 100-50 MCG/DOSE Aepb  Commonly known as:  ADVAIR  Inhale 1 puff into the  lungs as needed.     furosemide 20 MG tablet  Commonly known as:  LASIX  Take 1 tablet (20 mg total) by mouth daily.     hydrALAZINE 50 MG tablet  Commonly known as:  APRESOLINE  Take 1 tablet by mouth 3 times a day.     insulin aspart 100 UNIT/ML FlexPen  Commonly known as:  NOVOLOG FLEXPEN  Inject 15 Units into the skin 2 (two) times daily. ss     Insulin Glargine 100 UNIT/ML Solostar Pen  Commonly known as:  LANTUS  Inject 34 Units into the skin daily at 10 pm.     irbesartan-hydrochlorothiazide 300-12.5 MG tablet  Commonly known as:  AVALIDE  Take 0.5 tablets by mouth daily.     metFORMIN 1000 MG tablet  Commonly known as:  GLUCOPHAGE  Take 1 tablet (1,000 mg total) by mouth 2 (two) times daily with a meal.     methocarbamol 500 MG tablet  Commonly known as:  ROBAXIN  Take 1 tablet (500 mg total) by mouth 2 (two) times daily.     omeprazole 20 MG capsule  Commonly known as:  PRILOSEC  Take 20 mg by mouth daily.     ondansetron 4 MG disintegrating tablet  Commonly known as:  ZOFRAN ODT  Take 1 tablet (4 mg total) by mouth every 8 (eight) hours as needed for nausea or vomiting.     ONETOUCH VERIO test strip  Generic drug:  glucose blood  TEST BLOOD SUGAR twice a day     oxyCODONE-acetaminophen 5-325 MG tablet  Commonly known as:  PERCOCET/ROXICET  Take 2 tablets by mouth every 4 (four) hours as needed for severe pain.     polyethylene glycol packet  Commonly known as:  MIRALAX / GLYCOLAX  Take 17 g by mouth daily as needed for moderate constipation.     REPATHA 140 MG/ML Sosy  Generic drug:  Evolocumab  Inject 140 mg into the skin every 14 (fourteen) days.     rosuvastatin 40 MG tablet  Commonly known as:  CRESTOR  Take 1 tablet (40 mg total) by mouth daily.     spironolactone 50 MG tablet  Commonly known as:  ALDACTONE  Take 1 tablet (50 mg total) by mouth daily.     TYLENOL 8 HOUR ARTHRITIS PAIN 650 MG CR tablet  Generic  drug:  acetaminophen  Take  650 mg by mouth every 8 (eight) hours as needed for pain.     VICTOZA 18 MG/3ML Sopn  Generic drug:  Liraglutide  inject 1.2 milligram subcutaneously daily        Allergies:  Allergies  Allergen Reactions  . Ace Inhibitors Cough  . Shellfish Allergy Other (See Comments)    Pt cannot recall at this time    Past Medical History  Diagnosis Date  . Hypertension   . Diabetes mellitus without complication (HCC)   . ALLERGIC RHINITIS   . ASTHMA   . Esophageal reflux   . SINUSITIS, ACUTE     Past Surgical History  Procedure Laterality Date  . Abdominal hysterectomy    . Breast surgery      Family History  Problem Relation Age of Onset  . Diabetes Mother   . Heart attack Father   . Heart disease Father     Social History:  reports that she has never smoked. She does not have any smokeless tobacco history on file. She reports that she does not drink alcohol or use illicit drugs.  Review of Systems:  Hypertension:  has inadequate control recently with amlodipine, Cardura, Aldactone, clonidine and Avalide, followed by cardiologist   Renal function: Creatinine is back to being consistently near normal, previously higher with Mobic   Lab Results  Component Value Date   CREATININE 1.21* 02/20/2015     Lipids: Have not been controlled  Does have a history of CAD and carotid stenosis also.  She says that she is taking her Crestor and Zetia every day   She also things that she is taking her Repatha 140 mg every 2 weeks with the help of her daughter but she does not know where she is getting the prescription from LDL is higher than on her last visit   Lab Results  Component Value Date   CHOL 240* 02/21/2015   HDL 51.10 02/21/2015   LDLCALC 162* 02/21/2015   LDLDIRECT 125.0 08/06/2014   TRIG 133.0 02/21/2015   CHOLHDL 5 02/21/2015      Foot exam in 7/16   Examination:   BP 142/84 mmHg  Pulse 72  Temp(Src) 97.9 F (36.6 C)  Resp 16  Ht 5\' 2"  (1.575 m)  Wt  232 lb 3.2 oz (105.325 kg)  BMI 42.46 kg/m2  SpO2 98%  Body mass index is 42.46 kg/(m^2).    ASSESSMENT/ PLAN:    Diabetes type 2, uncontrolled:  See history of present illness for detailed discussion of his current management, blood sugar patterns and problems identified  Her A1c is still high and she likely is getting high post prandial readings She is not taking any insulin to cover her breakfast and probably not enough to cover her evening meal Although she claims she is taking Victoza and metformin she is gaining weight Eating unbalanced meals in the morning sometimes  She is now complaining about the cost of Lantus and Novolog and not clear what her insurance coverage is Discussed the option of using the V-go pump which could be fairly with the generic regular insulin and she will look into the coverage for this Encouraged her to start checking her blood sugars more consistently and bring monitor for download  Insulin doses as below, needs to start back on regular insulin at breakfast She will try to avoid high carbohydrate meals in the morning and added protein such as an egg or almonds   HYPERTENSION: This  is under fair control with multiple drugs, followed by audiology She will have her doxazosin prescribed by cardiologist also  Lipids: She has significantly high levels and she reports  continued good compliance with her Repatha, 40 mg Crestor and Zetia together However unlikely that she is taking these regularly and advised her to bring all her lipid medications for review on the next visit Will also plan to change her Repatha to the 420 mg monthly pump  Patient Instructions  Lantus  32 units at bedtime daily and 20 in am  Novolog 12 at breakfast, at lunch 20 and 22 at supper  Check blood sugars on waking up  3  times a week Also check blood sugars about 2 hours after a meal and do this after different meals by rotation  Recommended blood sugar levels on waking up is  90-130 and about 2 hours after meal is 130-160  Please bring your blood sugar monitor to each visit, thank you  Chair exercises  Repatha will be 1x per month  Bring all cholesterol meds and Repatha to office      Counseling time on subjects discussed above is over 50% of today's 25 minute visit    Jadalyn Oliveri 03/25/2015, 10:19 AM   Note: This office note was prepared with Insurance underwriter. Any transcriptional errors that result from this process are unintentional.

## 2015-03-25 NOTE — Patient Instructions (Addendum)
Lantus  32 units at bedtime daily and 20 in am  Novolog 12 at breakfast, at lunch 20 and 22 at supper  Check blood sugars on waking up  3  times a week Also check blood sugars about 2 hours after a meal and do this after different meals by rotation  Recommended blood sugar levels on waking up is 90-130 and about 2 hours after meal is 130-160  Please bring your blood sugar monitor to each visit, thank you  Chair exercises  Repatha will be 1x per month  Bring all cholesterol meds and Repatha to office

## 2015-04-08 ENCOUNTER — Ambulatory Visit (INDEPENDENT_AMBULATORY_CARE_PROVIDER_SITE_OTHER): Payer: Medicare Other | Admitting: Podiatry

## 2015-04-08 ENCOUNTER — Encounter: Payer: Self-pay | Admitting: Podiatry

## 2015-04-08 ENCOUNTER — Ambulatory Visit (INDEPENDENT_AMBULATORY_CARE_PROVIDER_SITE_OTHER): Payer: Medicare Other

## 2015-04-08 VITALS — BP 189/92 | HR 65 | Resp 12

## 2015-04-08 DIAGNOSIS — R609 Edema, unspecified: Secondary | ICD-10-CM | POA: Diagnosis not present

## 2015-04-08 DIAGNOSIS — M25471 Effusion, right ankle: Secondary | ICD-10-CM | POA: Diagnosis not present

## 2015-04-08 DIAGNOSIS — M25571 Pain in right ankle and joints of right foot: Secondary | ICD-10-CM | POA: Diagnosis not present

## 2015-04-08 LAB — CBC WITH DIFFERENTIAL/PLATELET
BASOS ABS: 0.1 10*3/uL (ref 0.0–0.1)
Basophils Relative: 1 % (ref 0–1)
Eosinophils Absolute: 0.1 10*3/uL (ref 0.0–0.7)
Eosinophils Relative: 2 % (ref 0–5)
HCT: 37.6 % (ref 36.0–46.0)
Hemoglobin: 12.1 g/dL (ref 12.0–15.0)
LYMPHS ABS: 2.3 10*3/uL (ref 0.7–4.0)
Lymphocytes Relative: 36 % (ref 12–46)
MCH: 28.2 pg (ref 26.0–34.0)
MCHC: 32.2 g/dL (ref 30.0–36.0)
MCV: 87.6 fL (ref 78.0–100.0)
MPV: 11.4 fL (ref 8.6–12.4)
Monocytes Absolute: 0.5 10*3/uL (ref 0.1–1.0)
Monocytes Relative: 7 % (ref 3–12)
NEUTROS ABS: 3.5 10*3/uL (ref 1.7–7.7)
NEUTROS PCT: 54 % (ref 43–77)
PLATELETS: 218 10*3/uL (ref 150–400)
RBC: 4.29 MIL/uL (ref 3.87–5.11)
RDW: 13.4 % (ref 11.5–15.5)
WBC: 6.5 10*3/uL (ref 4.0–10.5)

## 2015-04-08 LAB — URIC ACID: Uric Acid, Serum: 3.8 mg/dL (ref 2.4–7.0)

## 2015-04-08 LAB — C-REACTIVE PROTEIN: CRP: 0.6 mg/dL — ABNORMAL HIGH (ref <0.60)

## 2015-04-08 LAB — RHEUMATOID FACTOR: Rhuematoid fact SerPl-aCnc: <10 IU/mL (ref <=14)

## 2015-04-08 NOTE — Patient Instructions (Addendum)
Today your examination revealed a painful and swollen right ankle. On x-rays ankle joint demonstrate increased swelling I'm referring her the lab for arthritis screening panel Limit standing and walking Return 7 days   Diabetes and Foot Care Diabetes may cause you to have problems because of poor blood supply (circulation) to your feet and legs. This may cause the skin on your feet to become thinner, break easier, and heal more slowly. Your skin may become dry, and the skin may peel and crack. You may also have nerve damage in your legs and feet causing decreased feeling in them. You may not notice minor injuries to your feet that could lead to infections or more serious problems. Taking care of your feet is one of the most important things you can do for yourself.  HOME CARE INSTRUCTIONS  Wear shoes at all times, even in the house. Do not go barefoot. Bare feet are easily injured.  Check your feet daily for blisters, cuts, and redness. If you cannot see the bottom of your feet, use a mirror or ask someone for help.  Wash your feet with warm water (do not use hot water) and mild soap. Then pat your feet and the areas between your toes until they are completely dry. Do not soak your feet as this can dry your skin.  Apply a moisturizing lotion or petroleum jelly (that does not contain alcohol and is unscented) to the skin on your feet and to dry, brittle toenails. Do not apply lotion between your toes.  Trim your toenails straight across. Do not dig under them or around the cuticle. File the edges of your nails with an emery board or nail file.  Do not cut corns or calluses or try to remove them with medicine.  Wear clean socks or stockings every day. Make sure they are not too tight. Do not wear knee-high stockings since they may decrease blood flow to your legs.  Wear shoes that fit properly and have enough cushioning. To break in new shoes, wear them for just a few hours a day. This  prevents you from injuring your feet. Always look in your shoes before you put them on to be sure there are no objects inside.  Do not cross your legs. This may decrease the blood flow to your feet.  If you find a minor scrape, cut, or break in the skin on your feet, keep it and the skin around it clean and dry. These areas may be cleansed with mild soap and water. Do not cleanse the area with peroxide, alcohol, or iodine.  When you remove an adhesive bandage, be sure not to damage the skin around it.  If you have a wound, look at it several times a day to make sure it is healing.  Do not use heating pads or hot water bottles. They may burn your skin. If you have lost feeling in your feet or legs, you may not know it is happening until it is too late.  Make sure your health care provider performs a complete foot exam at least annually or more often if you have foot problems. Report any cuts, sores, or bruises to your health care provider immediately. SEEK MEDICAL CARE IF:   You have an injury that is not healing.  You have cuts or breaks in the skin.  You have an ingrown nail.  You notice redness on your legs or feet.  You feel burning or tingling in your legs or feet.  You have pain or cramps in your legs and feet.  Your legs or feet are numb.  Your feet always feel cold. SEEK IMMEDIATE MEDICAL CARE IF:   There is increasing redness, swelling, or pain in or around a wound.  There is a red line that goes up your leg.  Pus is coming from a wound.  You develop a fever or as directed by your health care provider.  You notice a bad smell coming from an ulcer or wound.   This information is not intended to replace advice given to you by your health care provider. Make sure you discuss any questions you have with your health care provider.   Document Released: 01/16/2000 Document Revised: 09/20/2012 Document Reviewed: 06/27/2012 Elsevier Interactive Patient Education [id number]  [employer]

## 2015-04-08 NOTE — Progress Notes (Signed)
   Subjective:    Patient ID: Heidi Campbell, female    DOB: Oct 20, 1945, 70 y.o.   MRN: [id number]  HPI   This patient presents today with a sudden onset of pain and swelling in the right ankle in the past 4 days. The pain and swelling increase with standing walking reduced somewhat with rest and elevation. Patient is applying ice and elevation to the right ankle. Patient does recall previous episode of bilateral ankle pain and swelling which he said was diagnosed with tendinitis and resolve with steroid injection. She says these in physical therapy for back pain and more recently was pressing against the board with both feet in a seated position 1 time and then noticed some swelling in the right ankle. Patient is unsure whether this exercise created right ankle pain. She did not recall any particular pain or discomfort while doing this physical therapy exercise  Patient is a diabetic and denies any history of ulceration, claudication or amputation   Review of Systems  Cardiovascular: Positive for leg swelling.       Objective:   Physical Exam  Orientated 3  Vascular: Nonpitting edema right ankle DP and PT pulses 2/4 bilaterally (patient quite reactive when palpating pedal pulse right) Capillary fill is immediate  Neurological: Sensation to 10 g monofilament wire intact 4/5 right 5/5left Vibratory sensation reactive bilaterally Ankle reflex equal and reactive bilaterally  Dermatological: No open skin lesions bilaterally  Musculoskeletal: HAV left Hammertoe second left General pain palpation lateral anterior medial right ankle Patient has tenderness upon range of motion right ankle without crepitus  X-ray examination weightbearing right ankle dated 04/08/2015  Intact bony structure without fracture and/or dislocation Bone density adequate Ankle mortise within normal limits Increased soft tissue density lateral ankle greater than medial ankle  Radiographic  impression: No acute bony abnormality noted in the right ankle dated 04/08/2015     Assessment & Plan:   Assessment: Satisfactory neurovascular status Painful swollen right ankle Rule out inflammatory origin At this time no evidence of bone activity, however, cannot rule out incipient Charcot  Plan: Patient was advised today that he could not give her a definitive diagnosis I recommended minimal standing weightbearing on the right ankle Patient referred to lab for CBC with differential, sedimentation rate, C-reactive protein, ANA, serum uric acid, rheumatoid factor Patient does take ongoing Tylenol extra strength advised not to increase Tylenol at this time if had breakthrough pain taken occasional ibuprofen  Reappoint 7 days

## 2015-04-09 LAB — ANA: Anti Nuclear Antibody(ANA): NEGATIVE

## 2015-04-09 LAB — SEDIMENTATION RATE: Sed Rate: 27 mm/hr (ref 0–30)

## 2015-04-10 ENCOUNTER — Telehealth: Payer: Self-pay | Admitting: *Deleted

## 2015-04-10 NOTE — Telephone Encounter (Addendum)
-----   Message from Carrington Clampichard C Tuchman, DPM sent at 04/10/2015 12:53 PM EST ----- Lab results dated 04/08/2014 arthritis panel All labs within normal limits with exception of mildly elevated C-reactive protein Will see patient at next scheduled visit, no immediate follow-up needed based on this lab.  Informed pt of Dr. Theotis Burrowuchman's review of the 04/08/2015 blood work.  Pt states she is doing better since she has been resting and icing as instructed, and requested the appt of 04/16/2015 be cancelled.

## 2015-04-15 ENCOUNTER — Other Ambulatory Visit: Payer: Self-pay | Admitting: *Deleted

## 2015-04-15 ENCOUNTER — Telehealth: Payer: Self-pay | Admitting: Endocrinology

## 2015-04-15 ENCOUNTER — Ambulatory Visit: Payer: Medicare Other | Admitting: Podiatry

## 2015-04-15 MED ORDER — DOXAZOSIN MESYLATE 4 MG PO TABS: ORAL_TABLET | ORAL | Status: DC

## 2015-04-15 MED ORDER — INSULIN PEN NEEDLE 32G X 4 MM MISC: Status: DC

## 2015-04-15 MED ORDER — INSULIN GLARGINE 100 UNIT/ML SOLOSTAR PEN: 34.0000 [IU] | PEN_INJECTOR | Freq: Every day | SUBCUTANEOUS | Status: DC

## 2015-04-15 MED ORDER — ONETOUCH DELICA LANCETS 33G MISC: Status: DC

## 2015-04-15 MED ORDER — INSULIN ASPART 100 UNIT/ML FLEXPEN: PEN_INJECTOR | SUBCUTANEOUS | Status: DC

## 2015-04-15 MED ORDER — METFORMIN HCL 1000 MG PO TABS: 1000.0000 mg | ORAL_TABLET | Freq: Two times a day (BID) | ORAL | Status: DC

## 2015-04-15 MED ORDER — GLUCOSE BLOOD VI STRP: ORAL_STRIP | Status: DC

## 2015-04-15 MED ORDER — LIRAGLUTIDE 18 MG/3ML ~~LOC~~ SOPN: PEN_INJECTOR | SUBCUTANEOUS | Status: DC

## 2015-04-15 NOTE — Telephone Encounter (Signed)
8 prescriptions have been sent.

## 2015-04-15 NOTE — Telephone Encounter (Signed)
Need to know from Daughter if she is taking Repatha and Crestor

## 2015-04-15 NOTE — Telephone Encounter (Signed)
Pt said she left a list of supplies and medications that she was out of 2 weeks ago when she was here in the office and she said she can't take her blood sugars because she is out of everything.

## 2015-04-16 ENCOUNTER — Ambulatory Visit: Payer: Medicare Other | Admitting: Podiatry

## 2015-04-16 NOTE — Telephone Encounter (Signed)
Heidi Campbell can give this to you

## 2015-04-16 NOTE — Telephone Encounter (Signed)
I do not have a phone number for her daughter.

## 2015-04-17 NOTE — Telephone Encounter (Signed)
I left a message with Mrs. Rembert to have her daughter return my call.

## 2015-04-22 ENCOUNTER — Other Ambulatory Visit (INDEPENDENT_AMBULATORY_CARE_PROVIDER_SITE_OTHER): Payer: Medicare Other

## 2015-04-22 DIAGNOSIS — Z794 Long term (current) use of insulin: Secondary | ICD-10-CM

## 2015-04-22 DIAGNOSIS — E1165 Type 2 diabetes mellitus with hyperglycemia: Secondary | ICD-10-CM | POA: Diagnosis not present

## 2015-04-22 LAB — LIPID PANEL
CHOLESTEROL: 167 mg/dL (ref 0–200)
HDL: 41.8 mg/dL (ref >39.00)
LDL Cholesterol: 94 mg/dL (ref 0–99)
NONHDL: 125.18
Total CHOL/HDL Ratio: 4
Triglycerides: 155 mg/dL — ABNORMAL HIGH (ref 0.0–149.0)
VLDL: 31 mg/dL (ref 0.0–40.0)

## 2015-04-22 LAB — BASIC METABOLIC PANEL
BUN: 24 mg/dL — ABNORMAL HIGH (ref 6–23)
CALCIUM: 9.5 mg/dL (ref 8.4–10.5)
CO2: 26 mEq/L (ref 19–32)
Chloride: 105 mEq/L (ref 96–112)
Creatinine, Ser: 1.39 mg/dL — ABNORMAL HIGH (ref 0.40–1.20)
GFR: 48.2 mL/min — AB (ref >60.00)
GLUCOSE: 193 mg/dL — AB (ref 70–99)
POTASSIUM: 4 meq/L (ref 3.5–5.1)
SODIUM: 139 meq/L (ref 135–145)

## 2015-04-23 ENCOUNTER — Ambulatory Visit (HOSPITAL_COMMUNITY)
Admission: RE | Admit: 2015-04-23 | Discharge: 2015-04-23 | Disposition: A | Discharge status: Home / Self Care | Payer: Medicare Other | Source: Ambulatory Visit | Attending: Cardiovascular Disease | Admitting: Cardiovascular Disease

## 2015-04-23 DIAGNOSIS — E785 Hyperlipidemia, unspecified: Secondary | ICD-10-CM | POA: Insufficient documentation

## 2015-04-23 DIAGNOSIS — I6523 Occlusion and stenosis of bilateral carotid arteries: Secondary | ICD-10-CM | POA: Diagnosis not present

## 2015-04-23 DIAGNOSIS — I6522 Occlusion and stenosis of left carotid artery: Secondary | ICD-10-CM | POA: Diagnosis present

## 2015-04-23 DIAGNOSIS — I779 Disorder of arteries and arterioles, unspecified: Secondary | ICD-10-CM

## 2015-04-23 DIAGNOSIS — I739 Peripheral vascular disease, unspecified: Secondary | ICD-10-CM

## 2015-04-23 LAB — FRUCTOSAMINE: FRUCTOSAMINE: 407 umol/L — AB (ref 0–285)

## 2015-04-24 ENCOUNTER — Ambulatory Visit: Payer: Medicare Other | Admitting: Endocrinology

## 2015-04-28 ENCOUNTER — Ambulatory Visit (INDEPENDENT_AMBULATORY_CARE_PROVIDER_SITE_OTHER): Payer: Medicare Other | Admitting: Endocrinology

## 2015-04-28 ENCOUNTER — Encounter: Payer: Self-pay | Admitting: Endocrinology

## 2015-04-28 VITALS — BP 152/88 | HR 69 | Temp 97.7°F | Resp 16 | Ht 62.0 in | Wt 230.6 lb

## 2015-04-28 DIAGNOSIS — E1165 Type 2 diabetes mellitus with hyperglycemia: Secondary | ICD-10-CM | POA: Diagnosis not present

## 2015-04-28 DIAGNOSIS — Z794 Long term (current) use of insulin: Secondary | ICD-10-CM | POA: Diagnosis not present

## 2015-04-28 DIAGNOSIS — E782 Mixed hyperlipidemia: Secondary | ICD-10-CM

## 2015-04-28 NOTE — Progress Notes (Signed)
Patient ID: Heidi Campbell, female   DOB: 10-15-1945, 70 y.o.   MRN: [id number]   Reason for Appointment: follow-up   History of Present Illness   Diagnosis: Type 2 DIABETES MELITUS, date of diagnosis: 1998      Previous history: She was initially treated with oral hypoglycemic drugs but switched to insulin when she was hospitalized for asthma in 2007. She has been on metformin but only 500 mg twice a day Over most of her diabetes history has been taking premixed insulin, NovoLog mix 70/30, twice a day Although her A1c in the past has been close to 7% she would tend to have hyperglycemia at 11 AM or overnight with his insulin She was given Victoza in 6/15 because of persistent poor control with her insulin regimen  Recent history:    Insulin regimen: Lantus 34 units at bedtime daily and 20 in am  Novolog 18 at breakfast, at lunch 0 and 22 at supper  Her A1c is still significantly high at 8.2 as of 2/17 Her fructosamine is increased at 407 indicating poor control  Current management, blood sugar patterns and problems identified:  She again did not bring her monitor for download and not clear what her blood sugar patterns are  Also she says she had no test strips to check her blood sugar until yesterday not clear what her blood sugars are at home  She is not taking her Novolog at lunchtime despite instructions  She now says that she has had steroid injections in her back  She does not exercise  She is complaining about the cost of her insulin again and is probably going to be approved for her V-go pump   Oral hypoglycemic drugs:  Metformin ER 2000 mg         Side effects from medications:  occasional diarrhea from metformin  Proper timing of medications in relation to meals: Yes         Monitors blood glucose:   twice a day     Glucometer:   One Touch Verio.          Blood Glucose readings as below by recall:  Mean values apply above for all meters except median  for One Touch  PRE-MEAL Fasting Lunch Dinner Bedtime Overall  Glucose range: 137   ? 158   Mean/median:          Meals:  breakfast is oatmeal or egg and toast. Lunch sanwiches . Supper at 7 pm     Physical activity: exercise: None recently, complaining of back pain  Dietician visit: Most recent: 1998 ?     Weight control:  Wt Readings from Last 3 Encounters:  04/28/15 230 lb 9.6 oz (104.599 kg)  03/25/15 232 lb 3.2 oz (105.325 kg)  12/12/14 226 lb (102.513 kg)        Diabetes labs:  Lab Results  Component Value Date   HGBA1C 8.2 03/25/2015   HGBA1C 12.6* 08/06/2014   HGBA1C 7.6* 02/04/2014   Lab Results  Component Value Date   MICROALBUR 6.9* 07/03/2013   LDLCALC 94 04/22/2015   CREATININE 1.39* 04/22/2015      Lab on 04/22/2015  Component Date Value Ref Range Status  . Sodium 04/22/2015 139  135 - 145 mEq/L Final  . Potassium 04/22/2015 4.0  3.5 - 5.1 mEq/L Final  . Chloride 04/22/2015 105  96 - 112 mEq/L Final  . CO2 04/22/2015 26  19 - 32 mEq/L Final  . Glucose,  Bld 04/22/2015 193* 70 - 99 mg/dL Final  . BUN 09/81/1914 24* 6 - 23 mg/dL Final  . Creatinine, Ser 04/22/2015 1.39* 0.40 - 1.20 mg/dL Final  . Calcium 78/29/5621 9.5  8.4 - 10.5 mg/dL Final  . GFR 30/86/5784 48.20* >60.00 mL/min Final  . Fructosamine 04/22/2015 407* 0 - 285 umol/L Final   Comment: Published reference interval for apparently healthy subjects between age 54 and 7 is 81 - 285 umol/L and in a poorly controlled diabetic population is 228 - 563 umol/L with a mean of 396 umol/L.   Marland Kitchen Cholesterol 04/22/2015 167  0 - 200 mg/dL Final   ATP III Classification       Desirable:  < 200 mg/dL               Borderline High:  200 - 239 mg/dL          High:  > = 696 mg/dL  . Triglycerides 04/22/2015 155.0* 0.0 - 149.0 mg/dL Final   Normal:  <295 mg/dLBorderline High:  150 - 199 mg/dL  . HDL 04/22/2015 41.80  >39.00 mg/dL Final  . VLDL 28/41/3244 31.0  0.0 - 40.0 mg/dL Final  . LDL Cholesterol  04/22/2015 94  0 - 99 mg/dL Final  . Total CHOL/HDL Ratio 04/22/2015 4   Final                  Men          Women1/2 Average Risk     3.4          3.3Average Risk          5.0          4.42X Average Risk          9.6          7.13X Average Risk          15.0          11.0                      . NonHDL 04/22/2015 125.18   Final   NOTE:  Non-HDL goal should be 30 mg/dL higher than patient's LDL goal (i.e. LDL goal of < 70 mg/dL, would have non-HDL goal of < 100 mg/dL)    HYPERTENSION and hypercholesterolemia: See Review of systems      Medication List       This list is accurate as of: 04/28/15  4:06 PM.  Always use your most recent med list.               albuterol 108 (90 Base) MCG/ACT inhaler  Commonly known as:  PROVENTIL HFA;VENTOLIN HFA  Inhale 2 puffs into the lungs every 6 (six) hours as needed for wheezing or shortness of breath.     amLODipine 10 MG tablet  Commonly known as:  NORVASC  Take 1 tablet (10 mg total) by mouth daily.     aspirin 81 MG tablet  Take 81 mg by mouth daily.     cloNIDine 0.2 MG tablet  Commonly known as:  CATAPRES  Take 0.2 mg by mouth 2 (two) times daily.     doxazosin 4 MG tablet  Commonly known as:  CARDURA  take 1 TABLET AT BEDTIME     ezetimibe 10 MG tablet  Commonly known as:  ZETIA  Take 1 tablet (10 mg total) by mouth daily.     fluticasone 50 MCG/ACT nasal spray  Commonly known  as:  FLONASE  Place 1 spray into both nostrils daily as needed for allergies.     Fluticasone-Salmeterol 100-50 MCG/DOSE Aepb  Commonly known as:  ADVAIR  Inhale 1 puff into the lungs as needed.     furosemide 20 MG tablet  Commonly known as:  LASIX  Take 1 tablet (20 mg total) by mouth daily.     glucose blood test strip  Commonly known as:  ONETOUCH VERIO  TEST BLOOD SUGAR twice a day Dx code E11.9     hydrALAZINE 50 MG tablet  Commonly known as:  APRESOLINE  Take 1 tablet by mouth 3 times a day.     insulin aspart 100 UNIT/ML FlexPen    Commonly known as:  NOVOLOG FLEXPEN  Inject 15 units twice a day     Insulin Glargine 100 UNIT/ML Solostar Pen  Commonly known as:  LANTUS  Inject 34 Units into the skin daily at 10 pm.     Insulin Pen Needle 32G X 4 MM Misc  Commonly known as:  BD PEN NEEDLE NANO U/F  Use 4 per day     irbesartan-hydrochlorothiazide 300-12.5 MG tablet  Commonly known as:  AVALIDE  Take 0.5 tablets by mouth daily.     Liraglutide 18 MG/3ML Sopn  Commonly known as:  VICTOZA  inject 1.2 milligram subcutaneously daily     metFORMIN 1000 MG tablet  Commonly known as:  GLUCOPHAGE  Take 1 tablet (1,000 mg total) by mouth 2 (two) times daily with a meal.     methocarbamol 500 MG tablet  Commonly known as:  ROBAXIN  Take 1 tablet (500 mg total) by mouth 2 (two) times daily.     omeprazole 20 MG capsule  Commonly known as:  PRILOSEC  Take 20 mg by mouth daily.     ondansetron 4 MG disintegrating tablet  Commonly known as:  ZOFRAN ODT  Take 1 tablet (4 mg total) by mouth every 8 (eight) hours as needed for nausea or vomiting.     ONETOUCH DELICA LANCETS 33G Misc  Use to check blood sugar twice a day dx code E11.9     oxyCODONE-acetaminophen 5-325 MG tablet  Commonly known as:  PERCOCET/ROXICET  Take 2 tablets by mouth every 4 (four) hours as needed for severe pain.     polyethylene glycol packet  Commonly known as:  MIRALAX / GLYCOLAX  Take 17 g by mouth daily as needed for moderate constipation.     REPATHA 140 MG/ML Sosy  Generic drug:  Evolocumab  Inject 140 mg into the skin every 14 (fourteen) days.     rosuvastatin 40 MG tablet  Commonly known as:  CRESTOR  Take 1 tablet (40 mg total) by mouth daily.     spironolactone 50 MG tablet  Commonly known as:  ALDACTONE  Take 1 tablet (50 mg total) by mouth daily.     TYLENOL 8 HOUR ARTHRITIS PAIN 650 MG CR tablet  Generic drug:  acetaminophen  Take 650 mg by mouth every 8 (eight) hours as needed for pain.        Allergies:   Allergies  Allergen Reactions  . Ace Inhibitors Cough  . Shellfish Allergy Other (See Comments)    Pt cannot recall at this time    Past Medical History  Diagnosis Date  . Hypertension   . Diabetes mellitus without complication (HCC)   . ALLERGIC RHINITIS   . ASTHMA   . Esophageal reflux   . SINUSITIS, ACUTE  Past Surgical History  Procedure Laterality Date  . Abdominal hysterectomy    . Breast surgery      Family History  Problem Relation Age of Onset  . Diabetes Mother   . Heart attack Father   . Heart disease Father     Social History:  reports that she has never smoked. She does not have any smokeless tobacco history on file. She reports that she does not drink alcohol or use illicit drugs.  Review of Systems:  Hypertension:  has inadequate control  with amlodipine, Cardura, Aldactone, clonidine and Avalide, followed by cardiologist   Renal function: Creatinine is slightly high   Lab Results  Component Value Date   CREATININE 1.39* 04/22/2015     Lipids: Have not been controlled  Does have a history of CAD and carotid stenosis also.  She says that she is taking her Crestor and Zetia every day   She Is reportedly taking her Repatha 140 mg every 2 weeks with the help of her daughter  LDL is much better than on her last visit   Lab Results  Component Value Date   CHOL 167 04/22/2015   HDL 41.80 04/22/2015   LDLCALC 94 04/22/2015   LDLDIRECT 125.0 08/06/2014   TRIG 155.0* 04/22/2015   CHOLHDL 4 04/22/2015      Foot exam in 7/16   Examination:   BP 152/88 mmHg  Pulse 69  Temp(Src) 97.7 F (36.5 C)  Resp 16  Ht 5\' 2"  (1.575 m)  Wt 230 lb 9.6 oz (104.599 kg)  BMI 42.17 kg/m2  SpO2 98%  Body mass index is 42.17 kg/(m^2).    ASSESSMENT/ PLAN:    Diabetes type 2, uncontrolled:  See history of present illness for detailed discussion of his current management, blood sugar patterns and problems identified She has had much worse blood sugar  control and she has not monitored her blood sugar Difficult to assess her blood sugar patterns and lack of information  He is not very motivated to take care of her diabetes or check her sugars as well as keep her prescriptions consistently filled Some of her hyperglycemia related to getting steroid injections in her back This explains her high fructosamine of 470  Also she always misunderstand her insulin instructions and now is not taking any coverage for her lunch meal Lab glucose was 193 after breakfast but she reports a better fasting glucose today  For now will change her insulin only slightly except for her instructions for higher doses when she gets another steroid injection next month She will take Novolog at lunch consistently Shown her her insulin doses and advised her to inform her daughter about the changes She will check her blood sugars as directed and bring monitor on the next visit Will consider using the V-go pump but only after she finishes her steroid injections  HYPERTENSION: This is not controlled, needs follow-up with her cardiologist/prescribing physicians   Lipids: She has significantly high levels and she reports  continued good compliance with her Repatha, 40 mg Crestor and Zetia together LDL is now improved and continue the same regimen  Patient Instructions  Lantus 34 units at bedtime daily and 20 in am   Novolog 18 at breakfast, at lunch 20 and 22 at supper  After steroid shot when sugar goes >200 use new doses for 1 week or so  Lantus 36 units at bedtime daily and 28 in am   Novolog 22 at breakfast, at lunch 26 and 26  at supper  Stay on Repatha  Check blood sugars on waking up 3-4  times a week Also check blood sugars about 2 hours after a meal and do this after different meals by rotation  Recommended blood sugar levels on waking up is 90-130 and about 2 hours after meal is 130-160  Please bring your blood sugar monitor to each visit, thank  you     Counseling time on subjects discussed above is over 50% of today's 25 minute visit    Teion Ballin 04/28/2015, 4:06 PM   Note: This office note was prepared with Dragon voice recognition system technology. Any transcriptional errors that result from this process are unintentional.

## 2015-04-28 NOTE — Patient Instructions (Addendum)
Lantus 34 units at bedtime daily and 20 in am   Novolog 18 at breakfast, at lunch 20 and 22 at supper  After steroid shot when sugar goes >200 use new doses for 1 week or so  Lantus 36 units at bedtime daily and 28 in am   Novolog 22 at breakfast, at lunch 26 and 26 at supper  Stay on Repatha  Check blood sugars on waking up 3-4  times a week Also check blood sugars about 2 hours after a meal and do this after different meals by rotation  Recommended blood sugar levels on waking up is 90-130 and about 2 hours after meal is 130-160  Please bring your blood sugar monitor to each visit, thank you

## 2015-05-27 ENCOUNTER — Ambulatory Visit (INDEPENDENT_AMBULATORY_CARE_PROVIDER_SITE_OTHER): Payer: Medicare Other | Admitting: Interventional Cardiology

## 2015-05-27 ENCOUNTER — Encounter: Payer: Self-pay | Admitting: Interventional Cardiology

## 2015-05-27 VITALS — BP 140/70 | HR 70 | Ht 62.0 in | Wt 228.8 lb

## 2015-05-27 DIAGNOSIS — R5383 Other fatigue: Secondary | ICD-10-CM | POA: Diagnosis not present

## 2015-05-27 DIAGNOSIS — I779 Disorder of arteries and arterioles, unspecified: Secondary | ICD-10-CM

## 2015-05-27 DIAGNOSIS — I2510 Atherosclerotic heart disease of native coronary artery without angina pectoris: Secondary | ICD-10-CM | POA: Diagnosis not present

## 2015-05-27 DIAGNOSIS — E785 Hyperlipidemia, unspecified: Secondary | ICD-10-CM | POA: Diagnosis not present

## 2015-05-27 DIAGNOSIS — I739 Peripheral vascular disease, unspecified: Principal | ICD-10-CM

## 2015-05-27 NOTE — Patient Instructions (Signed)
**Note De-identified Alexiya Franqui Obfuscation** Medication Instructions:  Same-no changes  Labwork: None  Testing/Procedures: None  Follow-Up: Your physician wants you to follow-up in: 9 months. You will receive a reminder letter in the mail two months in advance. If you don't receive a letter, please call our office to schedule the follow-up appointment.     If you need a refill on your cardiac medications before your next appointment, please call your pharmacy.   

## 2015-05-27 NOTE — Progress Notes (Signed)
Patient ID: Heidi Campbell, female   DOB: 1946-01-31, 70 y.o.   MRN: [id number]     Cardiology Office Note   Date:  05/27/2015   ID:  Heidi Campbell, DOB 1946-01-31, MRN [id number]  PCP:  Astrid DivineGRIFFIN,ELAINE COLLINS, MD    No chief complaint on file. HTN   Wt Readings from Last 3 Encounters:  05/27/15 228 lb 12.8 oz (103.783 kg)  04/28/15 230 lb 9.6 oz (104.599 kg)  03/25/15 232 lb 3.2 oz (105.325 kg)       History of Present Illness: Heidi Campbell is a 70 y.o. female  who has had medically managed CAD, from cath 2012. Her husband passed away from prostate CA in 2014. SHe has been checking her BP at home. Readings are getting better, typically in the 140s systolic.  Blood sugar is high at times.  Higher since she had steroid injection a few weeks ago in her hip and back.  SHe had  afall a few months ago.  Insulin was increased.   She uses a stationary bike for PT. She does 15 minutes 2x/day. Limited by both DOE and hip pain; down from 3 x/day.  No chest pain pain with doing the bike.    She feels SHOB, with exertion. No trouble lying flat.  Uses 2 pillows.  She feels no fluid retention.  Fatigue better since we reduced clonidine to twice a day.    Past Medical History  Diagnosis Date  . Hypertension   . Diabetes mellitus without complication (HCC)   . ALLERGIC RHINITIS   . ASTHMA   . Esophageal reflux   . SINUSITIS, ACUTE     Past Surgical History  Procedure Laterality Date  . Abdominal hysterectomy    . Breast surgery       Current Outpatient Prescriptions  Medication Sig Dispense Refill  . acetaminophen (TYLENOL 8 HOUR ARTHRITIS PAIN) 650 MG CR tablet Take 650 mg by mouth every 8 (eight) hours as needed for pain.    Marland Kitchen. albuterol (PROVENTIL HFA;VENTOLIN HFA) 108 (90 BASE) MCG/ACT inhaler Inhale 2 puffs into the lungs every 6 (six) hours as needed for wheezing or shortness of breath.    Marland Kitchen. amLODipine (NORVASC) 10 MG tablet Take 1 tablet (10 mg total) by mouth daily.  30 tablet 1  . aspirin 81 MG tablet Take 81 mg by mouth daily.    . cloNIDine (CATAPRES) 0.2 MG tablet Take 0.2 mg by mouth 2 (two) times daily.     Marland Kitchen. doxazosin (CARDURA) 4 MG tablet take 1 TABLET AT BEDTIME 30 tablet 3  . ezetimibe (ZETIA) 10 MG tablet Take 1 tablet (10 mg total) by mouth daily. 30 tablet 3  . fluticasone (FLONASE) 50 MCG/ACT nasal spray Place 1 spray into both nostrils daily as needed for allergies.     . Fluticasone-Salmeterol (ADVAIR) 100-50 MCG/DOSE AEPB Inhale 1 puff into the lungs as needed (ASTHMA).     Marland Kitchen. glucose blood (ONETOUCH VERIO) test strip TEST BLOOD SUGAR twice a day Dx code E11.9 100 each 3  . hydrALAZINE (APRESOLINE) 50 MG tablet Take 1 tablet by mouth 3 times a day. 90 tablet 11  . insulin aspart (NOVOLOG FLEXPEN) 100 UNIT/ML FlexPen Inject 15 units twice a day 15 mL 3  . Insulin Glargine (LANTUS) 100 UNIT/ML Solostar Pen Inject 34 Units into the skin daily at 10 pm. (Patient taking differently: Inject into the skin daily at 10 pm. Inject 20 units in am and 32 units in pm)  15 mL 2  . Insulin Pen Needle (BD PEN NEEDLE NANO U/F) 32G X 4 MM MISC Use 4 per day 130 each 3  . irbesartan-hydrochlorothiazide (AVALIDE) 300-12.5 MG per tablet Take 0.5 tablets by mouth daily.    . Liraglutide (VICTOZA) 18 MG/3ML SOPN inject 1.2 milligram subcutaneously daily 6 mL 3  . metFORMIN (GLUCOPHAGE) 1000 MG tablet Take 1 tablet (1,000 mg total) by mouth 2 (two) times daily with a meal. 60 tablet 3  . methocarbamol (ROBAXIN) 500 MG tablet Take 1 tablet (500 mg total) by mouth 2 (two) times daily. 20 tablet 0  . omeprazole (PRILOSEC) 20 MG capsule Take 20 mg by mouth daily.    . ondansetron (ZOFRAN ODT) 4 MG disintegrating tablet Take 1 tablet (4 mg total) by mouth every 8 (eight) hours as needed for nausea or vomiting. 20 tablet 0  . ONETOUCH DELICA LANCETS 33G MISC Use to check blood sugar twice a day dx code E11.9 100 each 3  . oxyCODONE-acetaminophen (PERCOCET/ROXICET) 5-325 MG  tablet Take 2 tablets by mouth every 4 (four) hours as needed for severe pain. 6 tablet 0  . polyethylene glycol (MIRALAX / GLYCOLAX) packet Take 17 g by mouth daily as needed for moderate constipation.     . rosuvastatin (CRESTOR) 40 MG tablet Take 1 tablet (40 mg total) by mouth daily. 30 tablet 6  . spironolactone (ALDACTONE) 50 MG tablet Take 1 tablet (50 mg total) by mouth daily. 30 tablet 11   No current facility-administered medications for this visit.    Allergies:   Ace inhibitors and Shellfish allergy    Social History:  The patient  reports that she has never smoked. She does not have any smokeless tobacco history on file. She reports that she does not drink alcohol or use illicit drugs.   Family History:  The patient's family history includes Diabetes in her mother; Heart attack in her father; Heart disease in her father.    ROS:  Please see the history of present illness.   Otherwise, review of systems are positive for DOE.  All other systems are reviewed and negative.    PHYSICAL EXAM: VS:  BP 140/70 mmHg  Pulse 70  Ht  (1.575 m)  Wt 228 lb 12.8 oz (103.783 kg)  BMI 41.84 kg/m2 , BMI Body mass index is 41.84 kg/(m^2). GEN: Well nourished, well developed, in no acute distress HEENT: normal Neck: no JVD, carotid bruits, or masses Cardiac: RRR; no murmurs, rubs, or gallops,no edema  Respiratory:  clear to auscultation bilaterally, normal work of breathing GI: soft, nontender, nondistended, + BS MS: no deformity or atrophy Skin: warm and dry, no rash Neuro:  Strength and sensation are intact Psych: euthymic mood, full affect   EKG:   The ekg ordered today demonstrates NSR, no ST segment changes   Recent Labs: 02/20/2015: ALT 17 04/08/2015: Hemoglobin 12.1; Platelets 218 04/22/2015: BUN 24*; Creatinine, Ser 1.39*; Potassium 4.0; Sodium 139   Lipid Panel    Component Value Date/Time   CHOL 167 04/22/2015 0935   TRIG 155.0* 04/22/2015 0935   HDL 41.80  04/22/2015 0935   CHOLHDL 4 04/22/2015 0935   VLDL 31.0 04/22/2015 0935   LDLCALC 94 04/22/2015 0935   LDLDIRECT 125.0 08/06/2014 1049     Other studies Reviewed: Additional studies/ records that were reviewed today with results demonstrating: .   ASSESSMENT AND PLAN:  1. HTN: Better control. Continue current medicines. 2. CAD: No angina. Continue current medical therapy. 3.  Fatigue: Improved with less clonidine. Continue to try to minimize dosage of that medicine.  SHOB should improve with better stamina.  4. Moderate carotid disease. Stable in 2017.   Current medicines are reviewed at length with the patient today.  The patient concerns regarding her medicines were addressed.  The following changes have been made:  No change  Labs/ tests ordered today include:   Orders Placed This Encounter  Procedures  . EKG 12-Lead    Recommend 150 minutes/week of aerobic exercise Low fat, low carb, high fiber diet recommended  Disposition:   FU in 8 months   Delorise Jackson., MD  05/27/2015 10:52 AM    [facility] [address] [place], [place], [place]  [address] Phone: [phone]; Fax: [phone]

## 2015-06-09 ENCOUNTER — Other Ambulatory Visit: Payer: Medicare Other

## 2015-06-12 ENCOUNTER — Ambulatory Visit: Payer: Medicare Other | Admitting: Endocrinology

## 2015-06-18 ENCOUNTER — Ambulatory Visit (INDEPENDENT_AMBULATORY_CARE_PROVIDER_SITE_OTHER): Payer: Medicare Other | Admitting: Podiatry

## 2015-06-18 ENCOUNTER — Encounter: Payer: Self-pay | Admitting: Podiatry

## 2015-06-18 DIAGNOSIS — B351 Tinea unguium: Secondary | ICD-10-CM

## 2015-06-18 DIAGNOSIS — M79676 Pain in unspecified toe(s): Secondary | ICD-10-CM

## 2015-06-18 NOTE — Patient Instructions (Signed)
Diabetes and Foot Care Diabetes may cause you to have problems because of poor blood supply (circulation) to your feet and legs. This may cause the skin on your feet to become thinner, break easier, and heal more slowly. Your skin may become dry, and the skin may peel and crack. You may also have nerve damage in your legs and feet causing decreased feeling in them. You may not notice minor injuries to your feet that could lead to infections or more serious problems. Taking care of your feet is one of the most important things you can do for yourself.  HOME CARE INSTRUCTIONS  Wear shoes at all times, even in the house. Do not go barefoot. Bare feet are easily injured.  Check your feet daily for blisters, cuts, and redness. If you cannot see the bottom of your feet, use a mirror or ask someone for help.  Wash your feet with warm water (do not use hot water) and mild soap. Then pat your feet and the areas between your toes until they are completely dry. Do not soak your feet as this can dry your skin.  Apply a moisturizing lotion or petroleum jelly (that does not contain alcohol and is unscented) to the skin on your feet and to dry, brittle toenails. Do not apply lotion between your toes.  Trim your toenails straight across. Do not dig under them or around the cuticle. File the edges of your nails with an emery board or nail file.  Do not cut corns or calluses or try to remove them with medicine.  Wear clean socks or stockings every day. Make sure they are not too tight. Do not wear knee-high stockings since they may decrease blood flow to your legs.  Wear shoes that fit properly and have enough cushioning. To break in new shoes, wear them for just a few hours a day. This prevents you from injuring your feet. Always look in your shoes before you put them on to be sure there are no objects inside.  Do not cross your legs. This may decrease the blood flow to your feet.  If you find a minor scrape,  cut, or break in the skin on your feet, keep it and the skin around it clean and dry. These areas may be cleansed with mild soap and water. Do not cleanse the area with peroxide, alcohol, or iodine.  When you remove an adhesive bandage, be sure not to damage the skin around it.  If you have a wound, look at it several times a day to make sure it is healing.  Do not use heating pads or hot water bottles. They may burn your skin. If you have lost feeling in your feet or legs, you may not know it is happening until it is too late.  Make sure your health care provider performs a complete foot exam at least annually or more often if you have foot problems. Report any cuts, sores, or bruises to your health care provider immediately. SEEK MEDICAL CARE IF:   You have an injury that is not healing.  You have cuts or breaks in the skin.  You have an ingrown nail.  You notice redness on your legs or feet.  You feel burning or tingling in your legs or feet.  You have pain or cramps in your legs and feet.  Your legs or feet are numb.  Your feet always feel cold. SEEK IMMEDIATE MEDICAL CARE IF:   There is increasing redness,   swelling, or pain in or around a wound.  There is a red line that goes up your leg.  Pus is coming from a wound.  You develop a fever or as directed by your health care provider.  You notice a bad smell coming from an ulcer or wound.   This information is not intended to replace advice given to you by your health care provider. Make sure you discuss any questions you have with your health care provider.   Document Released: 01/16/2000 Document Revised: 09/20/2012 Document Reviewed: 06/27/2012 Elsevier Interactive Patient Education 2016 [employer]  

## 2015-06-18 NOTE — Progress Notes (Signed)
Patient ID: Heidi Campbell, female   DOB: [birth year], 70 y.o.   MRN: [id number]  Subjective: This patient presents today complaining of uncomfortable toenails walking wearing shoes and requests toenail debridement. Patient states that after the visit of 04/08/2015 the right ankle pain resolves spontaneously without any specific treatment and place and did not present for follow-up to review lab results that were ordered on that visit  This patient presents on 04/08/2015 with a sudden onset of pain and swelling in the right ankle in the past 4 days. The pain and swelling increase with standing walking reduced somewhat with rest and elevation. Patient is applying ice and elevation to the right ankle. Patient does recall previous episode of bilateral ankle pain and swelling which he said was diagnosed with tendinitis and resolve with steroid injection. She says these in physical therapy for back pain and more recently was pressing against the board with both feet in a seated position 1 time and then noticed some swelling in the right ankle. Patient is unsure whether this exercise created right ankle pain. She did not recall any particular pain or discomfort while doing this physical therapy exercise  Patient is a diabetic and denies any history of ulceration, claudication or amputation   Orientated 3  Vascular: No edema right ankle DP and PT pulses 2/4 bilaterally (patient quite reactive when palpating pedal pulse right) Capillary fill is immediate  Neurological: Sensation to 10 g monofilament wire intact 4/5 right 5/5left Vibratory sensation reactive bilaterally Ankle reflex equal and reactive bilaterally  Dermatological: No open skin lesions bilaterally The toenails are elongated, brittle, deformed and tender to direct palpation 6-10  Musculoskeletal: HAV left Hammertoe second left No pain palpation lateral anterior medial right ankle Type II diabetic without foot  complications  Plan: Today I reviewed the results of the lab of 04/08/2015 and made patient aware of no follow-up is needed The toenails 6-10 were debrided mechanically and electrically without a nybleeding  Reappoint 3 months No pain on range of motion right ankle  Lab dated 04/08/2015 Mildly elevated C-reactive protein 0.6 Anti nuclear antibody negative Rheumatoid factor within normal limits Sedimentation rate within normal limits Uric acid within normal limits CBC with differential within normal limits  Assessment: Resolve right ankle pain undetermined origin Symptomatic onychomycoses 6-10

## 2015-08-25 ENCOUNTER — Ambulatory Visit: Payer: Medicare Other | Attending: Internal Medicine | Admitting: Rehabilitation

## 2015-08-25 ENCOUNTER — Encounter: Payer: Self-pay | Admitting: Rehabilitation

## 2015-08-25 DIAGNOSIS — M5442 Lumbago with sciatica, left side: Secondary | ICD-10-CM | POA: Insufficient documentation

## 2015-08-25 DIAGNOSIS — M5441 Lumbago with sciatica, right side: Secondary | ICD-10-CM | POA: Diagnosis present

## 2015-08-25 DIAGNOSIS — R2681 Unsteadiness on feet: Secondary | ICD-10-CM | POA: Diagnosis present

## 2015-08-25 DIAGNOSIS — R2689 Other abnormalities of gait and mobility: Secondary | ICD-10-CM | POA: Diagnosis present

## 2015-08-25 DIAGNOSIS — M6281 Muscle weakness (generalized): Secondary | ICD-10-CM | POA: Insufficient documentation

## 2015-08-25 NOTE — Therapy (Signed)
[facility] [address] [place], [place], [address] Phone: [phone]   Fax:  [phone]  Physical Therapy Evaluation  Patient Details  Name: Heidi Campbell MRN: [id number] Date of Birth: April 12, 1945 Referring Provider: Talmage Coin, MD  Encounter Date: 08/25/2015      PT End of Session - 08/25/15 1513    Visit Number 1   Number of Visits 13   Date for PT Re-Evaluation 10/09/15   Authorization Type U[employer] MDC G code every 10th visit   PT Start Time 1155   PT Stop Time 1240   PT Time Calculation (min) 45 min   Activity Tolerance Patient limited by pain   Behavior During Therapy [facility] for tasks assessed/performed      Past Medical History:  Diagnosis Date  . ALLERGIC RHINITIS   . ASTHMA   . Diabetes mellitus without complication (HCC)   . Esophageal reflux   . Hypertension   . SINUSITIS, ACUTE     Past Surgical History:  Procedure Laterality Date  . ABDOMINAL HYSTERECTOMY    . BREAST SURGERY      There were no vitals filed for this visit.       Subjective Assessment - 08/25/15 1158    Subjective "I didn't know I was coming, but I fell about 3 weeks ago and that was my second fall since December.  But I fell going into my outside kitchen door, I was going up the stairs."     Pertinent History chronic back pain   Limitations Walking;House hold activities   Patient Stated Goals "to get myself stronger"    Currently in Pain? Yes   Pain Score 6   6 is a good day   Pain Location Back   Pain Orientation Left   Pain Descriptors / Indicators Aching   Pain Type Chronic pain   Pain Radiating Towards down to back of thighs   Pain Onset More than a month ago   Pain Frequency Constant   Aggravating Factors  lying still, sitting still for long periods of time   Pain Relieving Factors moving, medication, heat            [facility] PT Assessment - 08/25/15 0001      Assessment   Medical Diagnosis balance    Referring Provider Talmage Coin, MD   Onset Date/Surgical Date --  notiiced decreased balance since December     Precautions   Precautions Fall     Restrictions   Weight Bearing Restrictions No     Balance Screen   Has the patient fallen in the past 6 months Yes   How many times? 1     Home Environment   Living Environment Private residence   Living Arrangements Alone   Available Help at Discharge Family;Friend(s);Available PRN/intermittently   Type of Home House   Home Access Stairs to enter   Entrance Stairs-Number of Steps 3   Entrance Stairs-Rails Left   Home Layout One level  2 steps into laundry   Home Equipment Grab bars - tub/shower;Shower seat;Crutches  walk in shower     Prior Function   Level of Independence Independent   Leisure likes to read, word finds, likes to garden      Cognition   Overall Cognitive Status Within Functional Limits for tasks assessed     Observation/Other Assessments   Focus on Therapeutic Outcomes (FOTO)  ABC 44.4%     Sensation   Light Touch Appears Intact  just radiating pain on back of thigh   Hot/Cold Appears Intact   Proprioception Appears Intact     Coordination   Gross Motor Movements are Fluid and Coordinated Yes   Fine Motor Movements are Fluid and Coordinated Yes     ROM / Strength   AROM / PROM / Strength Strength     Strength   Overall Strength Deficits   Overall Strength Comments L hip flex 3+/5 (with pain), R hip flex 4/5, B knee ext 5/5, L knee flex 4/5, R Knee flex 5/5, B ankle motions 5/5     Palpation   Spinal mobility limited extension-but decreased pain in thighs w/ ext, side bending to the L increases pain, rotation bilaterally increases pain     Transfers   Transfers Sit to Stand;Stand to Sit   Sit to Stand 6: Modified independent (Device/Increase time)   Stand to Sit 6: Modified independent (Device/Increase time)     Ambulation/Gait   Ambulation/Gait Yes   Ambulation/Gait Assistance 6: Modified  independent (Device/Increase time)   Ambulation Distance (Feet) 345 Feet   Assistive device None   Gait Pattern Step-through pattern;Decreased stride length;Lateral hip instability;Trendelenburg;Trunk flexed   Ambulation Surface Level;Indoor   Gait velocity 1.96 ft/sec   Stairs Yes   Stairs Assistance 6: Modified independent (Device/Increase time)   Stair Management Technique Two rails;Alternating pattern;Step to pattern;Forwards   Number of Stairs 4   Height of Stairs 6     Standardized Balance Assessment   Standardized Balance Assessment Dynamic Gait Index     Dynamic Gait Index   Level Surface Mild Impairment   Change in Gait Speed Mild Impairment   Gait with Horizontal Head Turns Mild Impairment   Gait with Vertical Head Turns Normal   Gait and Pivot Turn Mild Impairment   Step Over Obstacle Moderate Impairment   Step Around Obstacles Normal   Steps Moderate Impairment   Total Score 16   DGI comment: Scores of 19 or less are predictive of falls in older community living adults                           PT Education - 08/25/15 1512    Education provided Yes   Education Details Education on evaluation findings, goals, POC   Person(s) Educated Patient   Methods Explanation   Comprehension Verbalized understanding          PT Short Term Goals - 08/25/15 1521      PT SHORT TERM GOAL #1   Title Pt will initiate HEP for strengthening and balance in order to indicate improved functional mobility.  (Target date: 09/15/15)   Status New     PT SHORT TERM GOAL #2   Title Pt will improve DGI score to 19/24 in order to indicate decreased fall risk.     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     Status New           PT Long Term Goals - 08/25/15 1524      PT LONG TERM GOAL #1   Title Pt will be independent with HEP  in order to indicate improved functional mobility.  (Target Date: 10/06/15)   Status New     PT LONG TERM GOAL #2   Title Pt  will score 22/24 on DGI in order to indicate decreased fall risk.     Status New     PT LONG TERM GOAL #3   Title Pt will report no more than 4/10 pain in low back in order to indicate pain decreased limiting factor in mobility.     Status New     PT LONG TERM GOAL #4   Title Pt will improve ABC score to >70% in order to indicate improved pt confidence in balance.      Status New               Plan - 09-04-15 1514    Clinical Impression Statement Pt presents with decreased balance with two falls since December and increased low back pain, L>R with radiating pain in B posterior thighs.  Note history of uncontrolled DM, hypertension (that has only recently gotten controlled) that could impact therapy.  She also has limited support as she lives alone.  Upon PT evaluation, note that DGI score is 16/24, indicative of high fall risk, gait speed of 1.96 ft/sec, indicative of limited community ambulator and pain in low back that is exacerbated with forward flexion, side bending and rotation.  Pt is of evolving presentation and moderate complexity per PT POC.  Pt will benefit from skilled OP neuro PT in order to address deficits.     Rehab Potential Good   Clinical Impairments Affecting Rehab Potential history of chronic LBP   PT Frequency 2x / week   PT Duration 6 weeks   PT Treatment/Interventions ADLs/Self Care Home Management;Electrical Stimulation;Moist Heat;Ultrasound;Traction;DME Instruction;Gait training;Stair training;Functional mobility training;Therapeutic activities;Therapeutic exercise;Balance training;Neuromuscular re-education;Patient/family education;Manual techniques;Energy conservation   PT Next Visit Plan est HEP for core strengthening, extension exercises (probably small range), check SI joint, balance as able   Consulted and Agree with Plan of Care  Patient      Patient will benefit from skilled therapeutic intervention in order to improve the following deficits and impairments:  Abnormal gait, Decreased activity tolerance, Decreased balance, Decreased endurance, Decreased mobility, Decreased range of motion, Decreased strength, Difficulty walking, Increased muscle spasms, Impaired flexibility, Improper body mechanics, Postural dysfunction, Pain  Visit Diagnosis: Unsteadiness on feet - Plan: PT plan of care cert/re-cert  Other abnormalities of gait and mobility - Plan: PT plan of care cert/re-cert  Bilateral low back pain with sciatica, sciatica laterality unspecified - Plan: PT plan of care cert/re-cert  Muscle weakness (generalized) - Plan: PT plan of care cert/re-cert      G-Codes - Sep 04, 2015 1528    Functional Assessment Tool Used DGI: 16/24   Functional Limitation Mobility: Walking and moving around   Mobility: Walking and Moving Around Current Status [phone]) At least 20 percent but less than 40 percent impaired, limited or restricted   Mobility: Walking and Moving Around Goal Status [phone]) At least 1 percent but less than 20 percent impaired, limited or restricted       Problem List Patient Active Problem List   Diagnosis Date Noted  . Carotid artery disease (HCC) 06/20/2014  . Bradycardia 10/12/2013  . Fatigue 10/12/2013  . Hyperlipidemia 05/14/2013  . Type II or unspecified type diabetes mellitus without mention of complication, uncontrolled 02/19/2013  . Coronary atherosclerosis of native coronary artery 12/22/2012  . Obesity 12/22/2012  . Hypertension   . SINUSITIS, ACUTE 04/01/2008  . Type II or unspecified type diabetes mellitus without mention of complication, not stated as uncontrolled 02/23/2007  . ALLERGIC RHINITIS 02/23/2007  . ASTHMA 02/23/2007  .  Esophageal reflux 02/23/2007    Harriet Butte, PT, MPT [facility] [address] [place], [place],  [address] Phone: [phone]   Fax:  [phone] 08/25/15, 3:30 PM  Name: Heidi Campbell MRN: [id number] Date of Birth: [birth year]

## 2015-09-03 ENCOUNTER — Ambulatory Visit: Payer: Medicare Other | Attending: Internal Medicine | Admitting: Physical Therapy

## 2015-09-03 DIAGNOSIS — M6281 Muscle weakness (generalized): Secondary | ICD-10-CM | POA: Insufficient documentation

## 2015-09-03 DIAGNOSIS — R2681 Unsteadiness on feet: Secondary | ICD-10-CM | POA: Diagnosis not present

## 2015-09-03 DIAGNOSIS — M5442 Lumbago with sciatica, left side: Secondary | ICD-10-CM | POA: Diagnosis present

## 2015-09-03 DIAGNOSIS — R2689 Other abnormalities of gait and mobility: Secondary | ICD-10-CM | POA: Diagnosis present

## 2015-09-03 DIAGNOSIS — M5441 Lumbago with sciatica, right side: Secondary | ICD-10-CM | POA: Insufficient documentation

## 2015-09-03 NOTE — Therapy (Signed)
[facility] [address] [place], [place], [address] Phone: [phone]   Fax:  [phone]  Physical Therapy Treatment  Patient Details  Name: Heidi Campbell MRN: [id number] Date of Birth: 06-Jan-1946 Referring Provider: Talmage Coin, MD  Encounter Date: 09/03/2015      PT End of Session - 09/03/15 1132    Visit Number 2   Number of Visits 13   Date for PT Re-Evaluation 10/09/15   Authorization Type U[employer] MDC G code every 10th visit   PT Start Time 0935   PT Stop Time 1018   PT Time Calculation (min) 43 min   Activity Tolerance Patient limited by pain   Behavior During Therapy [facility] for tasks assessed/performed      Past Medical History:  Diagnosis Date  . ALLERGIC RHINITIS   . ASTHMA   . Diabetes mellitus without complication (HCC)   . Esophageal reflux   . Hypertension   . SINUSITIS, ACUTE     Past Surgical History:  Procedure Laterality Date  . ABDOMINAL HYSTERECTOMY    . BREAST SURGERY      There were no vitals filed for this visit.      Subjective Assessment - 09/03/15 0940    Subjective Nothing new since last visit.   Pertinent History chronic back pain   Limitations Walking;House hold activities   Patient Stated Goals "to get myself stronger"    Currently in Pain? No/denies   Pain Score 9    Pain Location Back   Pain Orientation Left   Pain Descriptors / Indicators Aching   Pain Type Chronic pain   Pain Onset More than a month ago   Pain Frequency Constant            Re-Alignment Routine Exercise For Pts with Osteoporosis Basic for  Postural Correction  Hooklying position- working on alignment and breathing (in through nose out through mouth          Shoulder retraction          Cervical retraction Leg lengthener- 1 knee bent, 1 knee straight, foot flex, push heel away from body Leg press-  1 knee bent, 1 knee straight, foot flex, push leg down into mat  Perform all 5x with 5  second hold continuing to breath correctly.  6/10pain after there EX               [facility] Adult PT Treatment/Exercise - 09/03/15 0001      Ambulation/Gait   Ambulation/Gait Yes   Ambulation/Gait Assistance 6: Modified independent (Device/Increase time)   Ambulation Distance (Feet) 200 Feet  no AD + 200' with rollator   Assistive device None;4-wheeled walker   Gait Pattern Step-through pattern;Decreased stride length;Lateral hip instability;Trendelenburg;Trunk flexed   Ambulation Surface Level;Indoor                PT Education - 09/03/15 1129    Education provided Yes   Education Details Initiated HEP for Re-Alignment Routine exercise- Basic for Postural Correction   Person(s) Educated Patient   Methods Explanation;Tactile cues;Verbal cues;Handout   Comprehension Verbalized understanding;Returned demonstration;Verbal cues required;Tactile cues required;Need further instruction          PT Short Term Goals - 08/25/15 1521      PT SHORT TERM GOAL #1   Title Pt will initiate HEP for strengthening and balance in order to indicate improved functional mobility.  (Target date: 09/15/15)   Status New     PT SHORT TERM GOAL #  2   Title Pt will improve DGI score to 19/24 in order to indicate decreased fall risk.     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     Status New           PT Long Term Goals - 08/25/15 1524      PT LONG TERM GOAL #1   Title Pt will be independent with HEP in order to indicate improved functional mobility.  (Target Date: 10/06/15)   Status New     PT LONG TERM GOAL #2   Title Pt will score 22/24 on DGI in order to indicate decreased fall risk.     Status New     PT LONG TERM GOAL #3   Title Pt will report no more than 4/10 pain in low back in order to indicate pain decreased limiting factor in  mobility.     Status New     PT LONG TERM GOAL #4   Title Pt will improve ABC score to >70% in order to indicate improved pt confidence in balance.      Status New               Plan - 09/03/15 1133    Clinical Impression Statement Initially pt was in severe back pain today.  After the posture re-alignment exercise pt reported moderate pain level and was able to ambulate for activity tolerance training.  Trialled rollator during part of the walking treatment and pt felt more supported during gait.   Rehab Potential Good   Clinical Impairments Affecting Rehab Potential history of chronic LBP   PT Frequency 2x / week   PT Duration 6 weeks   PT Treatment/Interventions ADLs/Self Care Home Management;Electrical Stimulation;Moist Heat;Ultrasound;Traction;DME Instruction;Gait training;Stair training;Functional mobility training;Therapeutic activities;Therapeutic exercise;Balance training;Neuromuscular re-education;Patient/family education;Manual techniques;Energy conservation   PT Next Visit Plan Review HEP for core strengthening, extension exercises (probably small range), check SI joint, balance as able; Assess rollator option.   Consulted and Agree with Plan of Care Patient      Patient will benefit from skilled therapeutic intervention in order to improve the following deficits and impairments:  Abnormal gait, Decreased activity tolerance, Decreased balance, Decreased endurance, Decreased mobility, Decreased range of motion, Decreased strength, Difficulty walking, Increased muscle spasms, Impaired flexibility, Improper body mechanics, Postural dysfunction, Pain  Visit Diagnosis: Unsteadiness on feet  Other abnormalities of gait and mobility  Bilateral low back pain with sciatica, sciatica laterality unspecified  Muscle weakness (generalized)     Problem List Patient Active Problem List   Diagnosis Date Noted  . Carotid artery disease (HCC) 06/20/2014  . Bradycardia  10/12/2013  . Fatigue 10/12/2013  . Hyperlipidemia 05/14/2013  . Type II or unspecified type diabetes mellitus without mention of complication, uncontrolled 02/19/2013  . Coronary atherosclerosis of native coronary artery 12/22/2012  . Obesity 12/22/2012  . Hypertension   . SINUSITIS, ACUTE 04/01/2008  . Type II or unspecified type diabetes mellitus without mention of complication, not stated as uncontrolled 02/23/2007  . ALLERGIC RHINITIS 02/23/2007  . ASTHMA 02/23/2007  . Esophageal reflux 02/23/2007    Hortencia Conradi, PTA  09/03/15, 11:40 AM [facility] [address] [place], [place], [address] Phone: [phone]   Fax:  [phone]  Name: Heidi Campbell MRN: [id number] Date of Birth:  12/01/1945    

## 2015-09-08 ENCOUNTER — Ambulatory Visit: Payer: Medicare Other | Admitting: Rehabilitation

## 2015-09-08 DIAGNOSIS — R2681 Unsteadiness on feet: Secondary | ICD-10-CM

## 2015-09-08 DIAGNOSIS — M5441 Lumbago with sciatica, right side: Secondary | ICD-10-CM

## 2015-09-08 DIAGNOSIS — M5442 Lumbago with sciatica, left side: Secondary | ICD-10-CM

## 2015-09-08 DIAGNOSIS — R2689 Other abnormalities of gait and mobility: Secondary | ICD-10-CM

## 2015-09-08 NOTE — Patient Instructions (Addendum)
Piriformis Stretch, Supine    Lie supine, one ankle crossed onto opposite knee. Holding bottom leg behind knee, gently pull legs toward chest until stretch is felt in buttock of top leg. Hold _60__ seconds. For deeper stretch gently push top knee away from body.  Repeat _3__ times per session. Do _2-3__ sessions per day.  Copyright  VHI. All rights reserved.   For the next two, stand in corner with chair in front of you for support.    Feet Together, Head Motion - Eyes Closed    With eyes closed and feet together, move head slowly, up and down x 10 reps, side to side x 10 reps and diagonals in both directions x 10 reps.  Do __2__ sessions per day.  Copyright  VHI. All rights reserved.   Feet Together (Compliant Surface) Arm Motion - Eyes Closed    Stand on compliant surface: __pillow or small cushion______ with feet together. Close eyes and keep arms by your side.   Repeat _3___ times per session for 30 seconds each. Do __2__ sessions per day.  Copyright  VHI. All rights reserved.

## 2015-09-08 NOTE — Therapy (Signed)
[facility] [address] [place], [place], [address] Phone: [phone]   Fax:  [phone]  Physical Therapy Treatment  Patient Details  Name: Heidi Campbell MRN: [id number] Date of Birth: Oct 31, 1945 Referring Provider: Talmage Coin, MD  Encounter Date: 09/08/2015      PT End of Session - 09/08/15 1157    Visit Number 3   Number of Visits 13   Date for PT Re-Evaluation 10/09/15   Authorization Type UHC MDC G code every 10th visit   PT Start Time 1154  pt late to appt   PT Stop Time 1237   PT Time Calculation (min) 43 min   Activity Tolerance Patient limited by pain   Behavior During Therapy [facility] for tasks assessed/performed      Past Medical History:  Diagnosis Date  . ALLERGIC RHINITIS   . ASTHMA   . Diabetes mellitus without complication (HCC)   . Esophageal reflux   . Hypertension   . SINUSITIS, ACUTE     Past Surgical History:  Procedure Laterality Date  . ABDOMINAL HYSTERECTOMY    . BREAST SURGERY      There were no vitals filed for this visit.      Subjective Assessment - 09/08/15 1156    Subjective Reports no changes since last visit.    Pertinent History chronic back pain   Limitations Walking;House hold activities   Patient Stated Goals "to get myself stronger"    Currently in Pain? No/denies   Pain Score 9    Pain Location Buttocks   Pain Orientation Left   Pain Descriptors / Indicators Aching   Pain Type Chronic pain   Pain Radiating Towards down into upper posterior thigh   Pain Onset More than a month ago   Pain Frequency Constant            Therex:  Performed Re-Alignment Routine Exercise For Pts with Osteoporosis Basic for  Postural Correction  Leg lengthener- 1 knee bent, 1 knee straight, foot flex, push heel away from body Leg press-  1 knee bent, 1 knee straight, foot flex, push leg down into mat  Perform all 5x with 5 second hold continuing to breath  correctly.  Also performed prone alternating LE extension x 5 reps progressing to alternating UE/LE opposite extension (very small ROM) x 5 reps on each side.  Provided pt with supine piriformis stretch that she can perform at home with use of strap for increased independence and decreased pain in LLE.  See pt instruction for details.    NMR:  Initiated corner balance tasks during session, see pt instruction for details on exercises.                       PT Education - 09/08/15 1157    Education provided Yes   Education Details HEP for balance, stretch for piriformis, see pt instruction for details.    Person(s) Educated Patient   Methods Explanation;Demonstration;Handout   Comprehension Verbalized understanding;Returned demonstration          PT Short Term Goals - 08/25/15 1521      PT SHORT TERM GOAL #1   Title Pt will initiate HEP for strengthening and balance in order to indicate improved functional mobility.  (Target date: 09/15/15)   Status New     PT SHORT TERM GOAL #2   Title Pt will improve DGI score to 19/24 in order to indicate decreased fall risk.  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     Status New           PT Long Term Goals - 08/25/15 1524      PT LONG TERM GOAL #1   Title Pt will be independent with HEP in order to indicate improved functional mobility.  (Target Date: 10/06/15)   Status New     PT LONG TERM GOAL #2   Title Pt will score 22/24 on DGI in order to indicate decreased fall risk.     Status New     PT LONG TERM GOAL #3   Title Pt will report no more than 4/10 pain in low back in order to indicate pain decreased limiting factor in mobility.     Status New     PT LONG TERM GOAL #4   Title Pt will improve ABC score to >70% in order to indicate improved pt confidence in balance.       Status New               Plan - 09/08/15 1614    Clinical Impression Statement Pt continues to have severe L buttock/thigh pain at beginning of session (9/10), therefore performed isometric exercises from last session with mild extension exercises with some relief.  Pt able to perform standing balance exercises, but pain continues to increase on L with continued walking.     Rehab Potential Good   Clinical Impairments Affecting Rehab Potential history of chronic LBP   PT Frequency 2x / week   PT Duration 6 weeks   PT Treatment/Interventions ADLs/Self Care Home Management;Electrical Stimulation;Moist Heat;Ultrasound;Traction;DME Instruction;Gait training;Stair training;Functional mobility training;Therapeutic activities;Therapeutic exercise;Balance training;Neuromuscular re-education;Patient/family education;Manual techniques;Energy conservation   PT Next Visit Plan Review HEP for core strengthening, extension exercises (probably small range), check SI joint, balance as able; Assess rollator option.   Consulted and Agree with Plan of Care Patient      Patient will benefit from skilled therapeutic intervention in order to improve the following deficits and impairments:  Abnormal gait, Decreased activity tolerance, Decreased balance, Decreased endurance, Decreased mobility, Decreased range of motion, Decreased strength, Difficulty walking, Increased muscle spasms, Impaired flexibility, Improper body mechanics, Postural dysfunction, Pain  Visit Diagnosis: Unsteadiness on feet  Other abnormalities of gait and mobility  Bilateral low back pain with sciatica, sciatica laterality unspecified     Problem List Patient Active Problem List   Diagnosis Date Noted  . Carotid artery disease (HCC) 06/20/2014  . Bradycardia 10/12/2013  . Fatigue 10/12/2013  . Hyperlipidemia 05/14/2013  . Type II or unspecified type diabetes mellitus without mention of complication, uncontrolled 02/19/2013   . Coronary atherosclerosis of native coronary artery 12/22/2012  . Obesity 12/22/2012  . Hypertension   . SINUSITIS, ACUTE 04/01/2008  . Type II or unspecified type diabetes mellitus without mention of complication, not stated as uncontrolled 02/23/2007  . ALLERGIC RHINITIS 02/23/2007  . ASTHMA 02/23/2007  . Esophageal reflux 02/23/2007    Harriet ButteEmily Sharai Overbay, PT, MPT [facility] [address] [place], [place], [address] Phone: [phone]   Fax:  [phone] 09/08/15, 4:18 PM  Name: Heidi Campbell MRN: [id number] Date of Birth: 06-04-1945

## 2015-09-11 ENCOUNTER — Ambulatory Visit: Payer: Medicare Other | Admitting: Rehabilitation

## 2015-09-11 ENCOUNTER — Encounter: Payer: Self-pay | Admitting: Rehabilitation

## 2015-09-11 DIAGNOSIS — R2689 Other abnormalities of gait and mobility: Secondary | ICD-10-CM

## 2015-09-11 DIAGNOSIS — R2681 Unsteadiness on feet: Secondary | ICD-10-CM | POA: Diagnosis not present

## 2015-09-11 DIAGNOSIS — M5441 Lumbago with sciatica, right side: Secondary | ICD-10-CM

## 2015-09-11 DIAGNOSIS — M5442 Lumbago with sciatica, left side: Secondary | ICD-10-CM

## 2015-09-11 NOTE — Therapy (Signed)
[facility] [address] [place], [place], [address] Phone: [phone]   Fax:  [phone]  Physical Therapy Treatment  Patient Details  Name: Heidi Campbell MRN: [id number] Date of Birth: 08/07/1945 Referring Provider: Talmage Coin, MD  Encounter Date: 09/11/2015      PT End of Session - 09/11/15 1023    Visit Number 4   Number of Visits 13   Date for PT Re-Evaluation 10/09/15   Authorization Type U[employer] MDC G code every 10th visit   PT Start Time 1017   PT Stop Time 1102   PT Time Calculation (min) 45 min   Activity Tolerance Patient limited by pain   Behavior During Therapy [facility] for tasks assessed/performed      Past Medical History:  Diagnosis Date  . ALLERGIC RHINITIS   . ASTHMA   . Diabetes mellitus without complication (HCC)   . Esophageal reflux   . Hypertension   . SINUSITIS, ACUTE     Past Surgical History:  Procedure Laterality Date  . ABDOMINAL HYSTERECTOMY    . BREAST SURGERY      There were no vitals filed for this visit.      Subjective Assessment - 09/11/15 1018    Subjective Continues to report high pain, waking her up early in the morning.     Patient is accompained by: Family member  daughter, [name]   Limitations Walking;House hold activities   Patient Stated Goals "to get myself stronger"    Currently in Pain? Yes   Pain Score 8    Pain Location Buttocks   Pain Orientation Right;Left   Pain Descriptors / Indicators Aching   Pain Type Chronic pain   Pain Radiating Towards down into back of thigh   Pain Onset More than a month ago   Pain Frequency Constant   Aggravating Factors  lying still, sitting still   Pain Relieving Factors moving, medication, heat.            TE:  Provided pt with hooklying posterior pelvic tilts x 10 reps with 5 second holds, pelvic tilt with bridging x 10 reps for HEP.  Also performed quadruped alternating LE extension x 10 reps with cues  for decreased lumbar extension for improved core activation progressing to quadruped with alternating opposite UE/LE extension x 5 reps, with cues as above.  Due to pt stating feeling "knots" in B hamstring area, had pt perform seated hamstring stretch x 2 reps of 60 secs with cues for upright posture.  Cues for holding stretch rather than moving in/out of stretch too quickly.    NMR: Went over current HEP (see last visit) for balance and added single exercise, see pt instruction for details.                        PT Education - 09/11/15 1022    Education provided Yes   Education Details additions to HEP, how to alternate strength and balance HEP for increased compliance.    Person(s) Educated Patient   Methods Explanation;Demonstration;Handout   Comprehension Verbalized understanding;Returned demonstration          PT Short Term Goals - 08/25/15 1521      PT SHORT TERM GOAL #1   Title Pt will initiate HEP for strengthening and balance in order to indicate improved functional mobility.  (Target date: 09/15/15)   Status New     PT SHORT TERM GOAL #2   Title Pt  will improve DGI score to 19/24 in order to indicate decreased fall risk.     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     Status New           PT Long Term Goals - 08/25/15 1524      PT LONG TERM GOAL #1   Title Pt will be independent with HEP in order to indicate improved functional mobility.  (Target Date: 10/06/15)   Status New     PT LONG TERM GOAL #2   Title Pt will score 22/24 on DGI in order to indicate decreased fall risk.     Status New     PT LONG TERM GOAL #3   Title Pt will report no more than 4/10 pain in low back in order to indicate pain decreased limiting factor in mobility.     Status New     PT LONG TERM GOAL #4   Title Pt will improve ABC score to  >70% in order to indicate improved pt confidence in balance.      Status New               Plan - 09/11/15 1239    Clinical Impression Statement Skilled session focused on core stabilization to decrease low back and thigh pain (8/10 today beginning of session 7/10 at end of session).  Also continue to address balance with additions to HEP.     Rehab Potential Good   Clinical Impairments Affecting Rehab Potential history of chronic LBP   PT Frequency 2x / week   PT Duration 6 weeks   PT Treatment/Interventions ADLs/Self Care Home Management;Electrical Stimulation;Moist Heat;Ultrasound;Traction;DME Instruction;Gait training;Stair training;Functional mobility training;Therapeutic activities;Therapeutic exercise;Balance training;Neuromuscular re-education;Patient/family education;Manual techniques;Energy conservation   PT Next Visit Plan rollator to allow longer distances? continue core strengthening and balance.    Consulted and Agree with Plan of Care Patient      Patient will benefit from skilled therapeutic intervention in order to improve the following deficits and impairments:  Abnormal gait, Decreased activity tolerance, Decreased balance, Decreased endurance, Decreased mobility, Decreased range of motion, Decreased strength, Difficulty walking, Increased muscle spasms, Impaired flexibility, Improper body mechanics, Postural dysfunction, Pain  Visit Diagnosis: Unsteadiness on feet  Other abnormalities of gait and mobility  Bilateral low back pain with sciatica, sciatica laterality unspecified     Problem List Patient Active Problem List   Diagnosis Date Noted  . Carotid artery disease (HCC) 06/20/2014  . Bradycardia 10/12/2013  . Fatigue 10/12/2013  . Hyperlipidemia 05/14/2013  . Type II or unspecified type diabetes mellitus without mention of complication, uncontrolled 02/19/2013  . Coronary atherosclerosis of native coronary artery 12/22/2012  . Obesity 12/22/2012   . Hypertension   . SINUSITIS, ACUTE 04/01/2008  . Type II or unspecified type diabetes mellitus without mention of complication, not stated as uncontrolled 02/23/2007  . ALLERGIC RHINITIS 02/23/2007  . ASTHMA 02/23/2007  . Esophageal reflux 02/23/2007    Harriet ButteEmily Damary Doland, PT, MPT [facility] [address] [place], [place], [address] Phone: [phone]   Fax:  [phone] 09/11/15, 12:43 PM  Name: Orson Aloevon V Mandato MRN: [id number] Date of Birth: [birth year]

## 2015-09-11 NOTE — Patient Instructions (Addendum)
PELVIC TILT: Posterior    Tighten abdominals, flatten low back.  Hold for 5 secs and then relax.   _10__ reps per set, __2_ sets per day, _5-7__ days per week   Copyright  VHI. All rights reserved.   Bracing With Bridging (Hook-Lying)    With neutral spine, tighten pelvic floor and abdominals and hold. Lift bottom. Repeat _10__ times. Do _2__ times a day.   Copyright  VHI. All rights reserved.    Feet Together (Compliant Surface) Head Motion - Eyes Closed    Stand on compliant surface: ___cushion or pillow_____ with feet together. Close eyes and move head slowly, up and down x 10 reps, side to side x 10 reps and diagonals x 10 reps.  You may have to slow down your head motion for this one compared to the one where you are not on the pillow.  Do __2__ sessions per day.  Copyright  VHI. All rights reserved.

## 2015-09-15 ENCOUNTER — Ambulatory Visit: Payer: Medicare Other | Admitting: Physical Therapy

## 2015-09-15 DIAGNOSIS — R2681 Unsteadiness on feet: Secondary | ICD-10-CM

## 2015-09-15 DIAGNOSIS — M5442 Lumbago with sciatica, left side: Secondary | ICD-10-CM

## 2015-09-15 DIAGNOSIS — M5441 Lumbago with sciatica, right side: Secondary | ICD-10-CM

## 2015-09-15 DIAGNOSIS — M6281 Muscle weakness (generalized): Secondary | ICD-10-CM

## 2015-09-15 DIAGNOSIS — R2689 Other abnormalities of gait and mobility: Secondary | ICD-10-CM

## 2015-09-15 NOTE — Patient Instructions (Addendum)
   Bracing With Bridging (Hook-Lying)    With neutral spine, tighten pelvic floor and abdominals and hold. Lift bottom. Repeat _10__ times. Do _2__ times a day.   Copyright  VHI. All rights reserved.    Feet Together (Compliant Surface) Head Motion - Eyes Closed    Stand on compliant surface: ___cushion or pillow_____ with feet together. Close eyes and move head slowly, up and down x 10 reps, side to side x 10 reps and diagonals x 10 reps.  You may have to slow down your head motion for this one compared to the one where you are not on the pillow.  Do __2__ sessions per day.  Copyright  VHI. All rights reserved.

## 2015-09-15 NOTE — Therapy (Signed)
[facility] [address] [place], [place], [address] Phone: [phone]   Fax:  [phone]  Physical Therapy Treatment  Patient Details  Name: Heidi Campbell MRN: [id number] Date of Birth: July 04, 1945 Referring Provider: Talmage CoinJeffrey Kerr, MD  Encounter Date: 09/15/2015      PT End of Session - 09/15/15 1120    Visit Number 5   Number of Visits 13   Date for PT Re-Evaluation 10/09/15   Authorization Type UHC MDC G code every 10th visit   PT Start Time 1107   PT Stop Time 1145   PT Time Calculation (min) 38 min   Activity Tolerance Patient limited by pain   Behavior During Therapy [facility] for tasks assessed/performed      Past Medical History:  Diagnosis Date  . ALLERGIC RHINITIS   . ASTHMA   . Diabetes mellitus without complication (HCC)   . Esophageal reflux   . Hypertension   . SINUSITIS, ACUTE     Past Surgical History:  Procedure Laterality Date  . ABDOMINAL HYSTERECTOMY    . BREAST SURGERY      There were no vitals filed for this visit.      Subjective Assessment - 09/15/15 1109    Subjective Reports that she has an orthopedic appointment tommorrow and that pain is about the same for daily activities then when first starting PT. Pain has been over whelming over the weekend and has been limited with walking and exercise..   Patient is accompained by: Family member  daughter, Heidi BradfordKimberly   Limitations Walking;House hold activities   Patient Stated Goals "to get myself stronger"    Currently in Pain? Yes   Pain Score 7    Pain Location Buttocks   Pain Orientation Right;Left   Pain Descriptors / Indicators Aching   Pain Onset More than a month ago   Pain Frequency Constant           Re-Alignment Routine Exercise For Pts with Osteoporosis Basic for  Postural Correction  Hooklying position- working on alignment and breathing (in through nose out through mouth)          Shoulder retraction  Cervical retraction Leg lengthener- 1 knee bent, 1 knee straight, foot flex, push heel away from body Leg press-  1 knee bent, 1 knee straight, foot flex, push leg down into mat  Perform all 5x with 5 second hold continuing to breath correctly.              [facility] Adult PT Treatment/Exercise - 09/15/15 0001               Lumbar Exercises: Stretches   Single Knee to Chest Stretch 30 seconds  x2 each LE     Knee/Hip Exercises: Supine   Bridges Strengthening;10 reps             Balance Exercises - 09/15/15 1137      Balance Exercises: Standing   Standing Eyes Closed Narrow base of support (BOS);Head turns;Foam/compliant surface   Other Standing Exercises standing wt shifts (lateral, anterior/ posterior)  without UE support.           PT Education - 09/15/15 1602    Education provided Yes   Education Details Reviewed and performed HEP given 09/11/15   Person(s) Educated Patient   Methods Explanation   Comprehension Verbalized understanding;Returned demonstration;Verbal cues required          PT Short Term Goals - 08/25/15 1521  PT SHORT TERM GOAL #1   Title Pt will initiate HEP for strengthening and balance in order to indicate improved functional mobility.  (Target date: 09/15/15)   Status New     PT SHORT TERM GOAL #2   Title Pt will improve DGI score to 19/24 in order to indicate decreased fall risk.     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     Status New           PT Long Term Goals - 08/25/15 1524      PT LONG TERM GOAL #1   Title Pt will be independent with HEP in order to indicate improved functional mobility.  (Target Date: 10/06/15)   Status New     PT LONG TERM GOAL #2   Title Pt will score 22/24 on DGI in order to indicate decreased fall risk.     Status New     PT LONG TERM GOAL #3   Title  Pt will report no more than 4/10 pain in low back in order to indicate pain decreased limiting factor in mobility.     Status New     PT LONG TERM GOAL #4   Title Pt will improve ABC score to >70% in order to indicate improved pt confidence in balance.      Status New               Plan - 09/15/15 1604    Clinical Impression Statement Pain started at a 7/10 and decreased to a 6/10; session focused on decreasing low back and thigh pain. Attempted some standing wt shifting activities but pt had a significant increase in pain, resumed with reviewing HEP given last session.   Rehab Potential Good   Clinical Impairments Affecting Rehab Potential history of chronic LBP   PT Frequency 2x / week   PT Duration 6 weeks   PT Treatment/Interventions ADLs/Self Care Home Management;Electrical Stimulation;Moist Heat;Ultrasound;Traction;DME Instruction;Gait training;Stair training;Functional mobility training;Therapeutic activities;Therapeutic exercise;Balance training;Neuromuscular re-education;Patient/family education;Manual techniques;Energy conservation   PT Next Visit Plan rollator to allow longer distances? Ask about orthopedic visit; continue core strengthening and balance.    Consulted and Agree with Plan of Care Patient      Patient will benefit from skilled therapeutic intervention in order to improve the following deficits and impairments:  Abnormal gait, Decreased activity tolerance, Decreased balance, Decreased endurance, Decreased mobility, Decreased range of motion, Decreased strength, Difficulty walking, Increased muscle spasms, Impaired flexibility, Improper body mechanics, Postural dysfunction, Pain  Visit Diagnosis: Unsteadiness on feet  Other abnormalities of gait and mobility  Muscle weakness (generalized)  Bilateral low back pain with sciatica, sciatica laterality unspecified     Problem List Patient Active Problem List   Diagnosis Date Noted  . Carotid artery  disease (HCC) 06/20/2014  . Bradycardia 10/12/2013  . Fatigue 10/12/2013  . Hyperlipidemia 05/14/2013  . Type II or unspecified type diabetes mellitus without mention of complication, uncontrolled 02/19/2013  . Coronary atherosclerosis of native coronary artery 12/22/2012  . Obesity 12/22/2012  . Hypertension   . SINUSITIS, ACUTE 04/01/2008  . Type II or unspecified type diabetes mellitus without mention of complication, not stated as uncontrolled 02/23/2007  . ALLERGIC RHINITIS 02/23/2007  . ASTHMA 02/23/2007  . Esophageal reflux 02/23/2007    Hortencia Conradi, PTA  09/15/15, 4:13 PM  [facility] [address] [place], [place], [address] Phone: [phone]   Fax:  [phone]  Name: Heidi Campbell MRN: [id number] Date of Birth: 12-May-1945

## 2015-09-18 ENCOUNTER — Ambulatory Visit: Payer: Medicare Other | Admitting: Rehabilitation

## 2015-09-22 ENCOUNTER — Ambulatory Visit: Payer: Medicare Other | Admitting: Rehabilitation

## 2015-09-22 DIAGNOSIS — R2681 Unsteadiness on feet: Secondary | ICD-10-CM

## 2015-09-22 DIAGNOSIS — R2689 Other abnormalities of gait and mobility: Secondary | ICD-10-CM

## 2015-09-22 DIAGNOSIS — M5442 Lumbago with sciatica, left side: Secondary | ICD-10-CM

## 2015-09-22 DIAGNOSIS — M6281 Muscle weakness (generalized): Secondary | ICD-10-CM

## 2015-09-22 DIAGNOSIS — M5441 Lumbago with sciatica, right side: Secondary | ICD-10-CM

## 2015-09-22 NOTE — Patient Instructions (Signed)
Outer Hip Stretch: Reclined IT Band Stretch (Strap)    Strap around opposite foot, pull across only as far as possible with shoulders on mat. Hold for _60___ seconds. Repeat _2___ times each leg.  Copyright  VHI. All rights reserved.    Iliotibial band stretch, side-leaning (standing)  Stand sideways near a wall with your injured side closest to the wall. Place a hand on the wall for support. Cross the leg farther from the wall over the other leg. Keep the foot closest to the wall flat on the floor. Lean your hips into the wall. Hold the stretch for 15 to 30 seconds. Repeat 3 times.  Abductor Strength: Side Plank Pose, on Knees    Press down with bottom knee to lift hips. Hold for __2-3__ breaths. Repeat _5___ times each side.  Copyright  VHI. All rights reserved.   Abduction: Clam (Eccentric) - Side-Lying    Lie on side with knees bent. Lift top knee, keeping feet together. Keep trunk steady. Slowly lower for 3-5 seconds. _10__ reps per set, _1__ sets per day, _5-7__ days per week.   http://ecce.exer.us/65   Abduction    Lift leg up toward ceiling (and backwards slightly). Return.  No weight on ankle yet! Repeat __10__ times each leg. Do _2___ sessions per day.  http://gt2.exer.us/386   Copyright  VHI. All rights reserved.

## 2015-09-22 NOTE — Therapy (Signed)
[facility] [address] [place], [place], [address] Phone: [phone]   Fax:  [phone]  Physical Therapy Treatment  Patient Details  Name: Heidi Campbell MRN: [id number] Date of Birth: 1945/07/23 Referring Provider: Talmage CoinJeffrey Kerr, MD  Encounter Date: 09/22/2015      PT End of Session - 09/22/15 1951    Visit Number 6   Number of Visits 13   Date for PT Re-Evaluation 10/09/15   Authorization Type UHC MDC G code every 10th visit   PT Start Time 1103   PT Stop Time 1147   PT Time Calculation (min) 44 min   Activity Tolerance Patient limited by pain   Behavior During Therapy [facility] for tasks assessed/performed      Past Medical History:  Diagnosis Date  . ALLERGIC RHINITIS   . ASTHMA   . Diabetes mellitus without complication (HCC)   . Esophageal reflux   . Hypertension   . SINUSITIS, ACUTE     Past Surgical History:  Procedure Laterality Date  . ABDOMINAL HYSTERECTOMY    . BREAST SURGERY      There were no vitals filed for this visit.      Subjective Assessment - 09/22/15 1108    Subjective I went to the doctor and he said that I have bursitis in both my hips.     Pertinent History chronic back pain   Limitations Walking;House hold activities   Patient Stated Goals "to get myself stronger"    Currently in Pain? Yes   Pain Score 8    Pain Location Hip   Pain Orientation Right;Left   Pain Descriptors / Indicators Aching   Pain Type Chronic pain   Pain Radiating Towards down into thighs   Pain Onset More than a month ago             TE:  Discussed hip bursitis with pt during session and provided pt with new HEP to address new diagnosis.  See pt instruction for details.                      PT Education - 09/22/15 1139    Education provided Yes   Education Details education on meaning of bursitis, exercises and stretches to address pain/strength.    Person(s) Educated  Patient   Methods Explanation;Demonstration;Handout   Comprehension Verbalized understanding;Returned demonstration          PT Short Term Goals - 08/25/15 1521      PT SHORT TERM GOAL #1   Title Pt will initiate HEP for strengthening and balance in order to indicate improved functional mobility.  (Target date: 09/15/15)   Status New     PT SHORT TERM GOAL #2   Title Pt will improve DGI score to 19/24 in order to indicate decreased fall risk.     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     Status New           PT Long Term Goals - 08/25/15 1524      PT LONG TERM GOAL #1   Title Pt will be independent with HEP in order to indicate improved functional mobility.  (Target Date: 10/06/15)   Status New     PT LONG TERM GOAL #2   Title Pt will  score 22/24 on DGI in order to indicate decreased fall risk.     Status New     PT LONG TERM GOAL #3   Title Pt will report no more than 4/10 pain in low back in order to indicate pain decreased limiting factor in mobility.     Status New     PT LONG TERM GOAL #4   Title Pt will improve ABC score to >70% in order to indicate improved pt confidence in balance.      Status New               Plan - 09/22/15 1952    Clinical Impression Statement Pt states she went to orthopedic MD last week with diagnosis of B hip bursitis.  Due to continued increased pain, addressed this during visit and provided with IT band stretches as well as hip strengthening, see pt instruction for details.     Rehab Potential Good   Clinical Impairments Affecting Rehab Potential history of chronic LBP   PT Frequency 2x / week   PT Duration 6 weeks   PT Treatment/Interventions ADLs/Self Care Home Management;Electrical Stimulation;Moist Heat;Ultrasound;Traction;DME Instruction;Gait training;Stair training;Functional mobility  training;Therapeutic activities;Therapeutic exercise;Balance training;Neuromuscular re-education;Patient/family education;Manual techniques;Energy conservation   PT Next Visit Plan Check STG's, rollator to allow longer distances? continue core strengthening and balance.    Consulted and Agree with Plan of Care Patient      Patient will benefit from skilled therapeutic intervention in order to improve the following deficits and impairments:  Abnormal gait, Decreased activity tolerance, Decreased balance, Decreased endurance, Decreased mobility, Decreased range of motion, Decreased strength, Difficulty walking, Increased muscle spasms, Impaired flexibility, Improper body mechanics, Postural dysfunction, Pain  Visit Diagnosis: Unsteadiness on feet  Other abnormalities of gait and mobility  Muscle weakness (generalized)  Bilateral low back pain with sciatica, sciatica laterality unspecified     Problem List Patient Active Problem List   Diagnosis Date Noted  . Carotid artery disease (HCC) 06/20/2014  . Bradycardia 10/12/2013  . Fatigue 10/12/2013  . Hyperlipidemia 05/14/2013  . Type II or unspecified type diabetes mellitus without mention of complication, uncontrolled 02/19/2013  . Coronary atherosclerosis of native coronary artery 12/22/2012  . Obesity 12/22/2012  . Hypertension   . SINUSITIS, ACUTE 04/01/2008  . Type II or unspecified type diabetes mellitus without mention of complication, not stated as uncontrolled 02/23/2007  . ALLERGIC RHINITIS 02/23/2007  . ASTHMA 02/23/2007  . Esophageal reflux 02/23/2007    Harriet ButteEmily Jakhiya Brower, PT, MPT [facility] [address] [place], [place], [address] Phone: [phone]   Fax:  [phone] 09/22/15, 7:55 PM  Name: Heidi Campbell MRN: [id number] Date of Birth: 1945-06-01

## 2015-09-24 ENCOUNTER — Ambulatory Visit: Payer: Medicare Other | Admitting: Podiatry

## 2015-09-25 ENCOUNTER — Ambulatory Visit: Payer: Medicare Other | Admitting: Physical Therapy

## 2015-09-25 DIAGNOSIS — M6281 Muscle weakness (generalized): Secondary | ICD-10-CM

## 2015-09-25 DIAGNOSIS — R2689 Other abnormalities of gait and mobility: Secondary | ICD-10-CM

## 2015-09-25 DIAGNOSIS — M5441 Lumbago with sciatica, right side: Secondary | ICD-10-CM

## 2015-09-25 DIAGNOSIS — R2681 Unsteadiness on feet: Secondary | ICD-10-CM

## 2015-09-25 DIAGNOSIS — M5442 Lumbago with sciatica, left side: Secondary | ICD-10-CM

## 2015-09-25 NOTE — Therapy (Signed)
[facility] [address] [place], [place], [address] Phone: [phone]   Fax:  [phone]  Physical Therapy Treatment  Patient Details  Name: Heidi Campbell MRN: [id number] Date of Birth: September 08, 1945 Referring Provider: Talmage CoinJeffrey Kerr, MD  Encounter Date: 09/25/2015      PT End of Session - 09/25/15 1204    Visit Number 7   Number of Visits 13   Date for PT Re-Evaluation 10/09/15   Authorization Type [employer] MDC G code every 10th visit   PT Start Time 1108   PT Stop Time 1146   PT Time Calculation (min) 38 min   Activity Tolerance Patient limited by pain   Behavior [facility] for tasks assessed/performed      Past Medical History:  Diagnosis Date  . ALLERGIC RHINITIS   . ASTHMA   . Diabetes mellitus without complication (HCC)   . Esophageal reflux   . Hypertension   . SINUSITIS, ACUTE     Past Surgical History:  Procedure Laterality Date  . ABDOMINAL HYSTERECTOMY    . BREAST SURGERY      There were no vitals filed for this visit.      Subjective Assessment - 09/25/15 1110    Subjective Reports that updated HEP is "hard"; practised at home.   Pertinent History chronic back pain   Limitations Walking;House hold activities   Patient Stated Goals "to get myself stronger"    Currently in Pain? Yes   Pain Score 8    Pain Location Back   Pain Orientation Lower   Pain Descriptors / Indicators Aching   Pain Type Chronic pain   Pain Radiating Towards down into Anterior and posterior thighs.   Pain Onset More than a month ago   Pain Frequency Constant                         [facility] Adult PT Treatment/Exercise - 09/25/15 0001      Exercises   Exercises Lumbar;Knee/Hip  performed HEP given 09/22/15, see pt instruction.     Knee/Hip Exercises: Stretches   Other Knee/Hip Stretches supine, manual hip distraction- straight leg. slight decrease in hip pain.     Knee/Hip Exercises:  Standing   Other Standing Knee Exercises resisted gait with red T-band for hip strengthening, intermittent UE support- (knees flexed) forward, backward, sideways                  PT Education - 09/25/15 1159    Education provided Yes   Education Details Reviewed HEP given 09/22/15, cues for technique.   Person(s) Educated Patient   Methods Explanation;Verbal cues;Handout   Comprehension Verbalized understanding;Returned demonstration          PT Short Term Goals - 08/25/15 1521      PT SHORT TERM GOAL #1   Title Pt will initiate HEP for strengthening and balance in order to indicate improved functional mobility.  (Target date: 09/15/15)   Status New     PT SHORT TERM GOAL #2   Title Pt will improve DGI score to 19/24 in order to indicate decreased fall risk.     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     Status New  PT Long Term Goals - 08/25/15 1524      PT LONG TERM GOAL #1   Title Pt will be independent with HEP in order to indicate improved functional mobility.  (Target Date: 10/06/15)   Status New     PT LONG TERM GOAL #2   Title Pt will score 22/24 on DGI in order to indicate decreased fall risk.     Status New     PT LONG TERM GOAL #3   Title Pt will report no more than 4/10 pain in low back in order to indicate pain decreased limiting factor in mobility.     Status New     PT LONG TERM GOAL #4   Title Pt will improve ABC score to >70% in order to indicate improved pt confidence in balance.      Status New               Plan - 09/25/15 1205    Clinical Impression Statement Pt demonstrates updated HEP. Put standing IT band stretch against wall on hold due to significant increase in hip pain when performing. Pt reported slight decrease in bilateral hip pain with maual hip distraction.   Rehab Potential  Good   Clinical Impairments Affecting Rehab Potential history of chronic LBP   PT Frequency 2x / week   PT Duration 6 weeks   PT Treatment/Interventions ADLs/Self Care Home Management;Electrical Stimulation;Moist Heat;Ultrasound;Traction;DME Instruction;Gait training;Stair training;Functional mobility training;Therapeutic activities;Therapeutic exercise;Balance training;Neuromuscular re-education;Patient/family education;Manual techniques;Energy conservation   PT Next Visit Plan Check STG's, rollator to allow longer distances? continue core strengthening and balance.    Consulted and Agree with Plan of Care Patient      Patient will benefit from skilled therapeutic intervention in order to improve the following deficits and impairments:  Abnormal gait, Decreased activity tolerance, Decreased balance, Decreased endurance, Decreased mobility, Decreased range of motion, Decreased strength, Difficulty walking, Increased muscle spasms, Impaired flexibility, Improper body mechanics, Postural dysfunction, Pain  Visit Diagnosis: Unsteadiness on feet  Other abnormalities of gait and mobility  Muscle weakness (generalized)  Bilateral low back pain with sciatica, sciatica laterality unspecified     Problem List Patient Active Problem List   Diagnosis Date Noted  . Carotid artery disease (HCC) 06/20/2014  . Bradycardia 10/12/2013  . Fatigue 10/12/2013  . Hyperlipidemia 05/14/2013  . Type II or unspecified type diabetes mellitus without mention of complication, uncontrolled 02/19/2013  . Coronary atherosclerosis of native coronary artery 12/22/2012  . Obesity 12/22/2012  . Hypertension   . SINUSITIS, ACUTE 04/01/2008  . Type II or unspecified type diabetes mellitus without mention of complication, not stated as uncontrolled 02/23/2007  . ALLERGIC RHINITIS 02/23/2007  . ASTHMA 02/23/2007  . Esophageal reflux 02/23/2007    Hortencia ConradiKarissa Senie Lanese, PTA  09/25/15, 12:12 PM [facility] [address] [place], [place], [address] Phone: [phone]   Fax:  [phone]  Name: Heidi Campbell MRN: [id number] Date of Birth: [birth year]

## 2015-09-25 NOTE — Patient Instructions (Signed)
Iliotibial band stretch, side-leaning (standing) (attempted, pt had significant increase in pain, put this exercise on hold 09/25/15) Stand sideways near a wall with your injured side closest to the wall. Place a hand on the wall for support. Cross the leg farther from the wall over the other leg. Keep the foot closest to the wall flat on the floor. Lean your hips into the wall. Hold the stretch for 15 to 30 seconds. Repeat 3 times.  Abductor Strength: Side Plank Pose, on Knees (performed 09/25/15)    Press down with bottom knee to lift hips. Hold for __2-3__ breaths. Repeat _5___ times each side.  Copyright  VHI. All rights reserved.    Abduction:( Performed 09/25/15)    Lift leg up toward ceiling (and backwards slightly). Return.  No weight on ankle yet! Repeat __10__ times each leg. Do _2___ sessions per day.  http://gt2.exer.us/386   Copyright  VHI. All rights reserved.

## 2015-09-29 ENCOUNTER — Ambulatory Visit: Payer: Medicare Other | Admitting: Rehabilitation

## 2015-09-30 ENCOUNTER — Other Ambulatory Visit: Payer: Self-pay | Admitting: Orthopedic Surgery

## 2015-09-30 DIAGNOSIS — M545 Low back pain: Secondary | ICD-10-CM

## 2015-10-02 ENCOUNTER — Ambulatory Visit: Payer: Medicare Other | Admitting: Rehabilitation

## 2015-10-03 ENCOUNTER — Ambulatory Visit: Payer: Medicare Other | Admitting: Rehabilitation

## 2015-10-08 ENCOUNTER — Ambulatory Visit: Payer: Medicare Other | Admitting: Physical Therapy

## 2015-10-09 ENCOUNTER — Other Ambulatory Visit: Payer: Self-pay | Admitting: Neurosurgery

## 2015-10-10 ENCOUNTER — Ambulatory Visit: Payer: Medicare Other | Admitting: Rehabilitation

## 2015-10-11 ENCOUNTER — Ambulatory Visit
Admission: RE | Admit: 2015-10-11 | Discharge: 2015-10-11 | Disposition: A | Discharge status: Home / Self Care | Payer: Medicare Other | Source: Ambulatory Visit | Attending: Orthopedic Surgery | Admitting: Orthopedic Surgery

## 2015-10-11 DIAGNOSIS — M545 Low back pain: Secondary | ICD-10-CM

## 2015-10-14 ENCOUNTER — Ambulatory Visit: Payer: Medicare Other | Admitting: Podiatry

## 2015-10-30 ENCOUNTER — Encounter (HOSPITAL_COMMUNITY): Payer: Self-pay

## 2015-10-30 ENCOUNTER — Encounter (HOSPITAL_COMMUNITY)
Admission: RE | Admit: 2015-10-30 | Discharge: 2015-10-30 | Disposition: A | Discharge status: Home / Self Care | Payer: Medicare Other | Source: Ambulatory Visit | Attending: Neurosurgery | Admitting: Neurosurgery

## 2015-10-30 DIAGNOSIS — Z01812 Encounter for preprocedural laboratory examination: Secondary | ICD-10-CM | POA: Diagnosis present

## 2015-10-30 DIAGNOSIS — M4316 Spondylolisthesis, lumbar region: Secondary | ICD-10-CM | POA: Diagnosis not present

## 2015-10-30 HISTORY — DX: Unspecified osteoarthritis, unspecified site: M19.90

## 2015-10-30 LAB — CBC WITH DIFFERENTIAL/PLATELET
Basophils Absolute: 0 10*3/uL (ref 0.0–0.1)
Basophils Relative: 1 %
EOS ABS: 0.1 10*3/uL (ref 0.0–0.7)
EOS PCT: 1 %
HCT: 40 % (ref 36.0–46.0)
Hemoglobin: 12.3 g/dL (ref 12.0–15.0)
LYMPHS PCT: 39 %
Lymphs Abs: 2.5 10*3/uL (ref 0.7–4.0)
MCH: 27.9 pg (ref 26.0–34.0)
MCHC: 30.8 g/dL (ref 30.0–36.0)
MCV: 90.7 fL (ref 78.0–100.0)
MONO ABS: 0.5 10*3/uL (ref 0.1–1.0)
MONOS PCT: 8 %
Neutro Abs: 3.4 10*3/uL (ref 1.7–7.7)
Neutrophils Relative %: 51 %
PLATELETS: 207 10*3/uL (ref 150–400)
RBC: 4.41 MIL/uL (ref 3.87–5.11)
RDW: 12.7 % (ref 11.5–15.5)
WBC: 6.5 10*3/uL (ref 4.0–10.5)

## 2015-10-30 LAB — BASIC METABOLIC PANEL
ANION GAP: 8 (ref 5–15)
BUN: 13 mg/dL (ref 6–20)
CHLORIDE: 106 mmol/L (ref 101–111)
CO2: 25 mmol/L (ref 22–32)
Calcium: 10.1 mg/dL (ref 8.9–10.3)
Creatinine, Ser: 1.01 mg/dL — ABNORMAL HIGH (ref 0.44–1.00)
GFR calc Af Amer: >60 mL/min (ref >60)
GFR, EST NON AFRICAN AMERICAN: 55 mL/min — AB (ref >60)
GLUCOSE: 163 mg/dL — AB (ref 65–99)
POTASSIUM: 4.1 mmol/L (ref 3.5–5.1)
Sodium: 139 mmol/L (ref 135–145)

## 2015-10-30 LAB — TYPE AND SCREEN
ABO/RH(D): AB POS
Antibody Screen: NEGATIVE

## 2015-10-30 LAB — SURGICAL PCR SCREEN
MRSA, PCR: NEGATIVE
Staphylococcus aureus: NEGATIVE

## 2015-10-30 LAB — GLUCOSE, CAPILLARY: Glucose-Capillary: 167 mg/dL — ABNORMAL HIGH (ref 65–99)

## 2015-10-30 LAB — ABO/RH: ABO/RH(D): AB POS

## 2015-10-30 NOTE — Pre-Procedure Instructions (Signed)
Heidi Campbell  10/30/2015      [id number] [place], [place] - [address] [place] [address] Phone: [phone] Fax: [phone]    Your procedure is scheduled on 11-06-2015  Thursday   Report to [facility] Admitting at 6:00 A.M.   Call this number if you have problems the morning of surgery:  [phone]   Remember:  Do not eat food or drink liquids after midnight.   Take these medicines the morning of surgery with A SIP OF WATER Tylenol if needed,albuterol inhaler if needed,Amlodipine(Norvasc),clonidine(Catapres),Zetia,flonase nasal spray if needed,hydralazine(Apresoline),methocarbamol)Robaxin),omeprazole(Prilosec),oxycodone if needed,rosuvastatin(Crestor),    STOP ASPIRIN,ANTIINFLAMATORIES (IBUPROFEN,ALEVE,MOTRIN,ADVIL,GOODY'S POWDERS),HERBAL SUPPLEMENTS,FISH OIL,AND VITAMINS 5-7 DAYS PRIOR TO SURGERY               How to Manage Your Diabetes Before and After Surgery  Why is it important to control my blood sugar before and after surgery? . Improving blood sugar levels before and after surgery helps healing and can limit problems. . A way of improving blood sugar control is eating a healthy diet by: o  Eating less sugar and carbohydrates o  Increasing activity/exercise o  Talking with your doctor about reaching your blood sugar goals . High blood sugars (greater than 180 mg/dL) can raise your risk of infections and slow your recovery, so you will need to focus on controlling your diabetes during the weeks before surgery. . Make sure that the doctor who takes care of your diabetes knows about your planned surgery including the date and location.  How do I manage my blood sugar before surgery? . Check your blood sugar at least 4 times a day, starting 2 days before surgery, to make sure that the level is not too high or low. o Check your blood sugar the morning of your surgery when you wake up and  every 2 hours until you get to the Short Stay unit. . If your blood sugar is less than 70 mg/dL, you will need to treat for low blood sugar: o Do not take insulin. o Treat a low blood sugar (less than 70 mg/dL) with  cup of clear juice (cranberry or apple), 4 glucose tablets, OR glucose gel. o Recheck blood sugar in 15 minutes after treatment (to make sure it is greater than 70 mg/dL). If your blood sugar is not greater than 70 mg/dL on recheck, call [phone] for further instructions. . Report your blood sugar to the short stay nurse when you get to Short Stay.  . If you are admitted to the hospital after surgery: o Your blood sugar will be checked by the staff and you will probably be given insulin after surgery (instead of oral diabetes medicines) to make sure you have good blood sugar levels. o The goal for blood sugar control after surgery is 80-180 mg/dL.              WHAT DO I DO ABOUT MY DIABETES MEDICATION?   Marland Kitchen Do not take oral diabetes medicines (pills) the morning of surgery.  . THE NIGHT BEFORE SURGERY, take 17 units of _Lantus insulin.      . The day of surgery, do not take other diabetes injectables, including Byetta (exenatide), Bydureon (exenatide ER), Victoza (liraglutide), or Trulicity (dulaglutide).   Reviewed and Endorsed by [facility] Patient Education Committee, August 2015      Do not wear jewelry, make-up or nail polish.  Do not wear lotions, powders, or perfumes, or deoderant.  Do not shave 48 hours prior to surgery.  Men may shave face and neck.  Do not bring valuables to the hospital.  [facility] is not responsible for any belongings or valuables.  Contacts, dentures or bridgework may not be worn into surgery.  Leave your suitcase in the car.  After surgery it may be brought to your room.  For patients admitted to the hospital, discharge time will be determined by your treatment team.  Patients discharged the day of surgery will not be  allowed to drive home.     Special instructions:  See attached Sheet for instructions on CHG showers

## 2015-10-31 LAB — HEMOGLOBIN A1C
Hgb A1c MFr Bld: 11.2 % — ABNORMAL HIGH (ref 4.8–5.6)
Mean Plasma Glucose: 275 mg/dL

## 2015-11-02 ENCOUNTER — Other Ambulatory Visit: Payer: Self-pay | Admitting: Interventional Cardiology

## 2015-11-07 ENCOUNTER — Inpatient Hospital Stay (HOSPITAL_COMMUNITY): Payer: Medicare Other

## 2015-11-07 ENCOUNTER — Encounter (HOSPITAL_COMMUNITY)
Admission: RE | Disposition: A | Discharge status: Home Health | Payer: Self-pay | Source: Ambulatory Visit | Attending: Neurosurgery

## 2015-11-07 ENCOUNTER — Inpatient Hospital Stay (HOSPITAL_COMMUNITY): Payer: Medicare Other | Admitting: Anesthesiology

## 2015-11-07 ENCOUNTER — Encounter (HOSPITAL_COMMUNITY): Payer: Self-pay | Admitting: Anesthesiology

## 2015-11-07 ENCOUNTER — Inpatient Hospital Stay (HOSPITAL_COMMUNITY)
Admission: RE | Admit: 2015-11-07 | Discharge: 2015-11-10 | DRG: 460 | Disposition: A | Discharge status: Home Health | Payer: Medicare Other | Source: Ambulatory Visit | Attending: Neurosurgery | Admitting: Neurosurgery

## 2015-11-07 DIAGNOSIS — Z888 Allergy status to other drugs, medicaments and biological substances status: Secondary | ICD-10-CM

## 2015-11-07 DIAGNOSIS — Z833 Family history of diabetes mellitus: Secondary | ICD-10-CM

## 2015-11-07 DIAGNOSIS — M545 Low back pain, unspecified: Secondary | ICD-10-CM

## 2015-11-07 DIAGNOSIS — M5416 Radiculopathy, lumbar region: Secondary | ICD-10-CM | POA: Diagnosis present

## 2015-11-07 DIAGNOSIS — K219 Gastro-esophageal reflux disease without esophagitis: Secondary | ICD-10-CM | POA: Diagnosis present

## 2015-11-07 DIAGNOSIS — Z794 Long term (current) use of insulin: Secondary | ICD-10-CM | POA: Diagnosis not present

## 2015-11-07 DIAGNOSIS — M48062 Spinal stenosis, lumbar region with neurogenic claudication: Principal | ICD-10-CM | POA: Diagnosis present

## 2015-11-07 DIAGNOSIS — Z7982 Long term (current) use of aspirin: Secondary | ICD-10-CM

## 2015-11-07 DIAGNOSIS — E119 Type 2 diabetes mellitus without complications: Secondary | ICD-10-CM | POA: Diagnosis present

## 2015-11-07 DIAGNOSIS — J45909 Unspecified asthma, uncomplicated: Secondary | ICD-10-CM | POA: Diagnosis present

## 2015-11-07 DIAGNOSIS — M4316 Spondylolisthesis, lumbar region: Secondary | ICD-10-CM | POA: Diagnosis present

## 2015-11-07 DIAGNOSIS — M48061 Spinal stenosis, lumbar region without neurogenic claudication: Secondary | ICD-10-CM | POA: Diagnosis present

## 2015-11-07 DIAGNOSIS — Z8249 Family history of ischemic heart disease and other diseases of the circulatory system: Secondary | ICD-10-CM

## 2015-11-07 DIAGNOSIS — Z91013 Allergy to seafood: Secondary | ICD-10-CM | POA: Diagnosis not present

## 2015-11-07 DIAGNOSIS — R5381 Other malaise: Secondary | ICD-10-CM

## 2015-11-07 DIAGNOSIS — I10 Essential (primary) hypertension: Secondary | ICD-10-CM | POA: Diagnosis present

## 2015-11-07 DIAGNOSIS — Z6841 Body Mass Index (BMI) 40.0 and over, adult: Secondary | ICD-10-CM

## 2015-11-07 DIAGNOSIS — M4310 Spondylolisthesis, site unspecified: Secondary | ICD-10-CM

## 2015-11-07 LAB — GLUCOSE, CAPILLARY
GLUCOSE-CAPILLARY: 217 mg/dL — AB (ref 65–99)
GLUCOSE-CAPILLARY: 251 mg/dL — AB (ref 65–99)
GLUCOSE-CAPILLARY: 305 mg/dL — AB (ref 65–99)
GLUCOSE-CAPILLARY: 313 mg/dL — AB (ref 65–99)
GLUCOSE-CAPILLARY: 361 mg/dL — AB (ref 65–99)
GLUCOSE-CAPILLARY: 326 mg/dL — AB (ref 65–99)

## 2015-11-07 SURGERY — POSTERIOR LUMBAR FUSION 1 LEVEL
Anesthesia: General | Site: Back

## 2015-11-07 MED ORDER — SODIUM CHLORIDE 0.9 % IV SOLN
250.0000 mL | INTRAVENOUS | Status: DC
  Administered 2015-11-07: 250 mL via INTRAVENOUS

## 2015-11-07 MED ORDER — HYDRALAZINE HCL 50 MG PO TABS
50.0000 mg | ORAL_TABLET | Freq: Three times a day (TID) | ORAL | Status: DC
  Administered 2015-11-07 – 2015-11-09 (×6): 50 mg via ORAL
  Filled 2015-11-07 (×8): qty 1

## 2015-11-07 MED ORDER — CLONIDINE HCL 0.1 MG PO TABS
0.2000 mg | ORAL_TABLET | Freq: Two times a day (BID) | ORAL | Status: DC
  Administered 2015-11-08 – 2015-11-09 (×3): 0.2 mg via ORAL
  Filled 2015-11-07 (×4): qty 2

## 2015-11-07 MED ORDER — ACETAMINOPHEN 325 MG PO TABS
650.0000 mg | ORAL_TABLET | ORAL | Status: DC | PRN
  Administered 2015-11-09: 650 mg via ORAL
  Filled 2015-11-07: qty 2

## 2015-11-07 MED ORDER — ONDANSETRON HCL 4 MG/2ML IJ SOLN
INTRAMUSCULAR | Status: DC | PRN
  Administered 2015-11-07: 4 mg via INTRAVENOUS

## 2015-11-07 MED ORDER — PANTOPRAZOLE SODIUM 40 MG PO TBEC
40.0000 mg | DELAYED_RELEASE_TABLET | Freq: Every day | ORAL | Status: DC
  Administered 2015-11-07 – 2015-11-10 (×4): 40 mg via ORAL
  Filled 2015-11-07 (×4): qty 1

## 2015-11-07 MED ORDER — ACETAMINOPHEN 10 MG/ML IV SOLN
1000.0000 mg | INTRAVENOUS | Status: AC
  Administered 2015-11-07: 1000 mg via INTRAVENOUS
  Filled 2015-11-07: qty 100

## 2015-11-07 MED ORDER — THROMBIN 20000 UNITS EX SOLR
CUTANEOUS | Status: AC
  Filled 2015-11-07: qty 20000

## 2015-11-07 MED ORDER — METOCLOPRAMIDE HCL 5 MG/ML IJ SOLN: 10.0000 mg | Freq: Once | INTRAMUSCULAR | Status: DC | PRN

## 2015-11-07 MED ORDER — LACTATED RINGERS IV SOLN
INTRAVENOUS | Status: DC
  Administered 2015-11-07 (×3): via INTRAVENOUS

## 2015-11-07 MED ORDER — FLUTICASONE PROPIONATE 50 MCG/ACT NA SUSP
1.0000 | Freq: Every day | NASAL | Status: DC | PRN
  Administered 2015-11-07 – 2015-11-09 (×2): 1 via NASAL
  Filled 2015-11-07: qty 16

## 2015-11-07 MED ORDER — HYDROMORPHONE HCL 1 MG/ML IJ SOLN: 0.5000 mg | INTRAMUSCULAR | Status: DC | PRN

## 2015-11-07 MED ORDER — 0.9 % SODIUM CHLORIDE (POUR BTL) OPTIME
TOPICAL | Status: DC | PRN
  Administered 2015-11-07: 1000 mL

## 2015-11-07 MED ORDER — AMLODIPINE BESYLATE 10 MG PO TABS
10.0000 mg | ORAL_TABLET | Freq: Every day | ORAL | Status: DC
  Administered 2015-11-08 – 2015-11-09 (×2): 10 mg via ORAL
  Filled 2015-11-07 (×3): qty 1

## 2015-11-07 MED ORDER — IRBESARTAN-HYDROCHLOROTHIAZIDE 300-12.5 MG PO TABS: 0.5000 | ORAL_TABLET | Freq: Every day | ORAL | Status: DC

## 2015-11-07 MED ORDER — ONDANSETRON 4 MG PO TBDP: 4.0000 mg | ORAL_TABLET | Freq: Three times a day (TID) | ORAL | Status: DC | PRN

## 2015-11-07 MED ORDER — DEXAMETHASONE SODIUM PHOSPHATE 10 MG/ML IJ SOLN
INTRAMUSCULAR | Status: AC
  Filled 2015-11-07: qty 1

## 2015-11-07 MED ORDER — FENTANYL CITRATE (PF) 100 MCG/2ML IJ SOLN
INTRAMUSCULAR | Status: AC
  Filled 2015-11-07: qty 4

## 2015-11-07 MED ORDER — SUGAMMADEX SODIUM 500 MG/5ML IV SOLN
INTRAVENOUS | Status: AC
  Filled 2015-11-07: qty 5

## 2015-11-07 MED ORDER — CEFAZOLIN IN D5W 1 GM/50ML IV SOLN
1.0000 g | Freq: Three times a day (TID) | INTRAVENOUS | Status: AC
  Administered 2015-11-07 (×2): 1 g via INTRAVENOUS
  Filled 2015-11-07 (×2): qty 50

## 2015-11-07 MED ORDER — HYDROCODONE-ACETAMINOPHEN 5-325 MG PO TABS
1.0000 | ORAL_TABLET | ORAL | Status: DC | PRN
  Administered 2015-11-07 – 2015-11-10 (×5): 2 via ORAL
  Filled 2015-11-07 (×5): qty 2
  Filled 2015-11-07: qty 1

## 2015-11-07 MED ORDER — ASPIRIN EC 81 MG PO TBEC
81.0000 mg | DELAYED_RELEASE_TABLET | Freq: Every day | ORAL | Status: DC
  Administered 2015-11-07 – 2015-11-10 (×4): 81 mg via ORAL
  Filled 2015-11-07 (×4): qty 1

## 2015-11-07 MED ORDER — INSULIN ASPART 100 UNIT/ML ~~LOC~~ SOLN
10.0000 [IU] | Freq: Three times a day (TID) | SUBCUTANEOUS | Status: DC
  Administered 2015-11-07 – 2015-11-09 (×7): 10 [IU] via SUBCUTANEOUS

## 2015-11-07 MED ORDER — POLYETHYLENE GLYCOL 3350 17 G PO PACK
17.0000 g | PACK | Freq: Every day | ORAL | Status: DC | PRN
  Administered 2015-11-07: 17 g via ORAL
  Filled 2015-11-07: qty 1

## 2015-11-07 MED ORDER — THROMBIN 20000 UNITS EX SOLR
CUTANEOUS | Status: DC | PRN
  Administered 2015-11-07: 09:00:00 via TOPICAL

## 2015-11-07 MED ORDER — MIDAZOLAM HCL 2 MG/2ML IJ SOLN
INTRAMUSCULAR | Status: AC
  Filled 2015-11-07: qty 2

## 2015-11-07 MED ORDER — IRBESARTAN 300 MG PO TABS
300.0000 mg | ORAL_TABLET | Freq: Every day | ORAL | Status: DC
  Administered 2015-11-07 – 2015-11-09 (×3): 300 mg via ORAL
  Filled 2015-11-07 (×3): qty 1

## 2015-11-07 MED ORDER — ACETAMINOPHEN 650 MG RE SUPP: 650.0000 mg | RECTAL | Status: DC | PRN

## 2015-11-07 MED ORDER — LIDOCAINE 2% (20 MG/ML) 5 ML SYRINGE
INTRAMUSCULAR | Status: AC
  Filled 2015-11-07: qty 5

## 2015-11-07 MED ORDER — EPHEDRINE 5 MG/ML INJ
INTRAVENOUS | Status: AC
  Filled 2015-11-07: qty 10

## 2015-11-07 MED ORDER — PROPOFOL 10 MG/ML IV BOLUS
INTRAVENOUS | Status: DC | PRN
  Administered 2015-11-07: 150 mg via INTRAVENOUS

## 2015-11-07 MED ORDER — SUGAMMADEX SODIUM 500 MG/5ML IV SOLN
INTRAVENOUS | Status: DC | PRN
  Administered 2015-11-07: 350 mg via INTRAVENOUS

## 2015-11-07 MED ORDER — PROPOFOL 10 MG/ML IV BOLUS
INTRAVENOUS | Status: AC
  Filled 2015-11-07: qty 20

## 2015-11-07 MED ORDER — EPHEDRINE SULFATE 50 MG/ML IJ SOLN
INTRAMUSCULAR | Status: DC | PRN
  Administered 2015-11-07 (×2): 5 mg via INTRAVENOUS
  Administered 2015-11-07: 10 mg via INTRAVENOUS

## 2015-11-07 MED ORDER — ALBUTEROL SULFATE (2.5 MG/3ML) 0.083% IN NEBU: 2.5000 mg | INHALATION_SOLUTION | Freq: Four times a day (QID) | RESPIRATORY_TRACT | Status: DC | PRN

## 2015-11-07 MED ORDER — CHLORHEXIDINE GLUCONATE CLOTH 2 % EX PADS: 6.0000 | MEDICATED_PAD | Freq: Once | CUTANEOUS | Status: DC

## 2015-11-07 MED ORDER — INSULIN ASPART 100 UNIT/ML ~~LOC~~ SOLN
0.0000 [IU] | Freq: Three times a day (TID) | SUBCUTANEOUS | Status: DC
  Administered 2015-11-07: 15 [IU] via SUBCUTANEOUS
  Administered 2015-11-08: 7 [IU] via SUBCUTANEOUS
  Administered 2015-11-08: 4 [IU] via SUBCUTANEOUS
  Administered 2015-11-08: 50 [IU] via SUBCUTANEOUS
  Administered 2015-11-09 (×2): 4 [IU] via SUBCUTANEOUS
  Administered 2015-11-09: 7 [IU] via SUBCUTANEOUS
  Administered 2015-11-10: 13 [IU] via SUBCUTANEOUS

## 2015-11-07 MED ORDER — CEFAZOLIN SODIUM-DEXTROSE 2-4 GM/100ML-% IV SOLN
INTRAVENOUS | Status: AC
  Filled 2015-11-07: qty 100

## 2015-11-07 MED ORDER — BUPIVACAINE HCL (PF) 0.25 % IJ SOLN
INTRAMUSCULAR | Status: DC | PRN
  Administered 2015-11-07: 20 mL

## 2015-11-07 MED ORDER — LIRAGLUTIDE 18 MG/3ML ~~LOC~~ SOPN: 1.2000 mg | PEN_INJECTOR | Freq: Every day | SUBCUTANEOUS | Status: DC

## 2015-11-07 MED ORDER — FENTANYL CITRATE (PF) 100 MCG/2ML IJ SOLN: 25.0000 ug | INTRAMUSCULAR | Status: DC | PRN

## 2015-11-07 MED ORDER — ONDANSETRON HCL 4 MG/2ML IJ SOLN
INTRAMUSCULAR | Status: AC
  Filled 2015-11-07: qty 2

## 2015-11-07 MED ORDER — INSULIN GLARGINE 100 UNIT/ML ~~LOC~~ SOLN
34.0000 [IU] | Freq: Every day | SUBCUTANEOUS | Status: DC
  Administered 2015-11-07 – 2015-11-09 (×3): 34 [IU] via SUBCUTANEOUS
  Filled 2015-11-07 (×4): qty 0.34

## 2015-11-07 MED ORDER — DIAZEPAM 5 MG PO TABS
5.0000 mg | ORAL_TABLET | Freq: Four times a day (QID) | ORAL | Status: DC | PRN
  Administered 2015-11-07 – 2015-11-08 (×3): 5 mg via ORAL
  Filled 2015-11-07 (×3): qty 1

## 2015-11-07 MED ORDER — ROCURONIUM BROMIDE 100 MG/10ML IV SOLN
INTRAVENOUS | Status: DC | PRN
  Administered 2015-11-07: 50 mg via INTRAVENOUS
  Administered 2015-11-07: 10 mg via INTRAVENOUS
  Administered 2015-11-07: 20 mg via INTRAVENOUS

## 2015-11-07 MED ORDER — ROCURONIUM BROMIDE 10 MG/ML (PF) SYRINGE
PREFILLED_SYRINGE | INTRAVENOUS | Status: AC
  Filled 2015-11-07: qty 10

## 2015-11-07 MED ORDER — VANCOMYCIN HCL 1000 MG IV SOLR
INTRAVENOUS | Status: AC
  Filled 2015-11-07: qty 1000

## 2015-11-07 MED ORDER — MENTHOL 3 MG MT LOZG: 1.0000 | LOZENGE | OROMUCOSAL | Status: DC | PRN

## 2015-11-07 MED ORDER — CEFAZOLIN SODIUM-DEXTROSE 2-4 GM/100ML-% IV SOLN
2.0000 g | INTRAVENOUS | Status: AC
  Administered 2015-11-07: 2 g via INTRAVENOUS

## 2015-11-07 MED ORDER — SPIRONOLACTONE 25 MG PO TABS
50.0000 mg | ORAL_TABLET | Freq: Every day | ORAL | Status: DC
  Administered 2015-11-07 – 2015-11-10 (×4): 50 mg via ORAL
  Filled 2015-11-07 (×4): qty 2

## 2015-11-07 MED ORDER — ONDANSETRON HCL 4 MG/2ML IJ SOLN: 4.0000 mg | INTRAMUSCULAR | Status: DC | PRN

## 2015-11-07 MED ORDER — HYDROCHLOROTHIAZIDE 12.5 MG PO CAPS
12.5000 mg | ORAL_CAPSULE | Freq: Every day | ORAL | Status: DC
  Administered 2015-11-07 – 2015-11-09 (×3): 12.5 mg via ORAL
  Filled 2015-11-07 (×3): qty 1

## 2015-11-07 MED ORDER — EZETIMIBE 10 MG PO TABS
10.0000 mg | ORAL_TABLET | Freq: Every day | ORAL | Status: DC
  Administered 2015-11-07 – 2015-11-10 (×4): 10 mg via ORAL
  Filled 2015-11-07 (×4): qty 1

## 2015-11-07 MED ORDER — SODIUM CHLORIDE 0.9 % IR SOLN: Status: DC | PRN

## 2015-11-07 MED ORDER — MIDAZOLAM HCL 5 MG/5ML IJ SOLN
INTRAMUSCULAR | Status: DC | PRN
  Administered 2015-11-07: 2 mg via INTRAVENOUS

## 2015-11-07 MED ORDER — PHENOL 1.4 % MT LIQD: 1.0000 | OROMUCOSAL | Status: DC | PRN

## 2015-11-07 MED ORDER — BUPIVACAINE HCL (PF) 0.25 % IJ SOLN
INTRAMUSCULAR | Status: AC
  Filled 2015-11-07: qty 30

## 2015-11-07 MED ORDER — INSULIN ASPART 100 UNIT/ML FLEXPEN
10.0000 [IU] | PEN_INJECTOR | Freq: Three times a day (TID) | SUBCUTANEOUS | Status: DC
  Filled 2015-11-07: qty 3

## 2015-11-07 MED ORDER — SODIUM CHLORIDE 0.9% FLUSH: 3.0000 mL | INTRAVENOUS | Status: DC | PRN

## 2015-11-07 MED ORDER — ROSUVASTATIN CALCIUM 40 MG PO TABS
40.0000 mg | ORAL_TABLET | Freq: Every day | ORAL | Status: DC
  Administered 2015-11-07 – 2015-11-10 (×4): 40 mg via ORAL
  Filled 2015-11-07: qty 2
  Filled 2015-11-07 (×4): qty 1

## 2015-11-07 MED ORDER — OXYCODONE-ACETAMINOPHEN 5-325 MG PO TABS: 1.0000 | ORAL_TABLET | ORAL | Status: DC | PRN

## 2015-11-07 MED ORDER — LIDOCAINE HCL (CARDIAC) 20 MG/ML IV SOLN
INTRAVENOUS | Status: DC | PRN
  Administered 2015-11-07: 100 mg via INTRAVENOUS

## 2015-11-07 MED ORDER — SODIUM CHLORIDE 0.9% FLUSH
3.0000 mL | Freq: Two times a day (BID) | INTRAVENOUS | Status: DC
  Administered 2015-11-08 – 2015-11-09 (×3): 3 mL via INTRAVENOUS

## 2015-11-07 MED ORDER — MOMETASONE FURO-FORMOTEROL FUM 100-5 MCG/ACT IN AERO
2.0000 | INHALATION_SPRAY | Freq: Two times a day (BID) | RESPIRATORY_TRACT | Status: DC
  Administered 2015-11-07 – 2015-11-10 (×7): 2 via RESPIRATORY_TRACT
  Filled 2015-11-07: qty 8.8

## 2015-11-07 MED ORDER — MEPERIDINE HCL 25 MG/ML IJ SOLN: 6.2500 mg | INTRAMUSCULAR | Status: DC | PRN

## 2015-11-07 MED ORDER — FENTANYL CITRATE (PF) 100 MCG/2ML IJ SOLN
INTRAMUSCULAR | Status: DC | PRN
  Administered 2015-11-07 (×3): 50 ug via INTRAVENOUS

## 2015-11-07 MED ORDER — BACITRACIN 50000 UNITS IM SOLR
INTRAMUSCULAR | Status: DC | PRN
  Administered 2015-11-07: 10:00:00

## 2015-11-07 MED ORDER — DEXAMETHASONE SODIUM PHOSPHATE 10 MG/ML IJ SOLN
10.0000 mg | INTRAMUSCULAR | Status: AC
  Administered 2015-11-07: 10 mg via INTRAVENOUS

## 2015-11-07 MED ORDER — SUGAMMADEX SODIUM 200 MG/2ML IV SOLN
INTRAVENOUS | Status: AC
  Filled 2015-11-07: qty 2

## 2015-11-07 MED ORDER — DOXAZOSIN MESYLATE 8 MG PO TABS
4.0000 mg | ORAL_TABLET | Freq: Every day | ORAL | Status: DC
  Administered 2015-11-08: 4 mg via ORAL
  Filled 2015-11-07: qty 1

## 2015-11-07 MED ORDER — VANCOMYCIN HCL 1000 MG IV SOLR
INTRAVENOUS | Status: DC | PRN
  Administered 2015-11-07: 1000 mg via TOPICAL

## 2015-11-07 MED ORDER — METFORMIN HCL 500 MG PO TABS
1000.0000 mg | ORAL_TABLET | Freq: Two times a day (BID) | ORAL | Status: DC
  Administered 2015-11-07 – 2015-11-10 (×6): 1000 mg via ORAL
  Filled 2015-11-07 (×7): qty 2

## 2015-11-07 MED FILL — Sodium Chloride IV Soln 0.9%: INTRAVENOUS | Qty: 1000 | Status: AC

## 2015-11-07 MED FILL — Heparin Sodium (Porcine) Inj 1000 Unit/ML: INTRAMUSCULAR | Qty: 30 | Status: AC

## 2015-11-07 SURGICAL SUPPLY — 61 items
ADH SKN CLS APL DERMABOND .7 (GAUZE/BANDAGES/DRESSINGS) ×1
APL SKNCLS STERI-STRIP NONHPOA (GAUZE/BANDAGES/DRESSINGS) ×1
BAG DECANTER FOR FLEXI CONT (MISCELLANEOUS) ×3 IMPLANT
BENZOIN TINCTURE PRP APPL 2/3 (GAUZE/BANDAGES/DRESSINGS) ×3 IMPLANT
BLADE CLIPPER SURG (BLADE) IMPLANT
BUR CUTTER 7.0 ROUND (BURR) IMPLANT
BUR MATCHSTICK NEURO 3.0 LAGG (BURR) ×3 IMPLANT
CANISTER SUCT 3000ML PPV (MISCELLANEOUS) ×3 IMPLANT
CAP LCK SPNE (Orthopedic Implant) ×4 IMPLANT
CAP LOCK SPINE RADIUS (Orthopedic Implant) IMPLANT
CAP LOCKING (Orthopedic Implant) ×12 IMPLANT
CLOSURE WOUND 1/2 X4 (GAUZE/BANDAGES/DRESSINGS) ×1
CONT SPEC 4OZ CLIKSEAL STRL BL (MISCELLANEOUS) ×1 IMPLANT
COVER BACK TABLE 60X90IN (DRAPES) ×3 IMPLANT
DECANTER SPIKE VIAL GLASS SM (MISCELLANEOUS) IMPLANT
DERMABOND ADVANCED (GAUZE/BANDAGES/DRESSINGS) ×2
DERMABOND ADVANCED .7 DNX12 (GAUZE/BANDAGES/DRESSINGS) ×1 IMPLANT
DEVICE INTERBODY ELEVATE 23X8 (Cage) ×6 IMPLANT
DRAPE C-ARM 42X72 X-RAY (DRAPES) ×6 IMPLANT
DRAPE HALF SHEET 40X57 (DRAPES) IMPLANT
DRAPE LAPAROTOMY 100X72X124 (DRAPES) ×3 IMPLANT
DRAPE POUCH INSTRU U-SHP 10X18 (DRAPES) ×3 IMPLANT
DRAPE SURG 17X23 STRL (DRAPES) ×12 IMPLANT
DRSG OPSITE POSTOP 4X8 (GAUZE/BANDAGES/DRESSINGS) ×3 IMPLANT
DURAPREP 26ML APPLICATOR (WOUND CARE) ×3 IMPLANT
ELECT REM PT RETURN 9FT ADLT (ELECTROSURGICAL) ×3
ELECTRODE REM PT RTRN 9FT ADLT (ELECTROSURGICAL) ×1 IMPLANT
EVACUATOR 1/8 PVC DRAIN (DRAIN) ×1 IMPLANT
GAUZE SPONGE 4X4 12PLY STRL (GAUZE/BANDAGES/DRESSINGS) IMPLANT
GAUZE SPONGE 4X4 16PLY XRAY LF (GAUZE/BANDAGES/DRESSINGS) IMPLANT
GLOVE BIO SURGEON STRL SZ8 (GLOVE) ×2 IMPLANT
GLOVE ECLIPSE 9.0 STRL (GLOVE) ×6 IMPLANT
GLOVE EXAM NITRILE LRG STRL (GLOVE) IMPLANT
GLOVE EXAM NITRILE XL STR (GLOVE) IMPLANT
GLOVE EXAM NITRILE XS STR PU (GLOVE) IMPLANT
GOWN STRL REUS W/ TWL LRG LVL3 (GOWN DISPOSABLE) IMPLANT
GOWN STRL REUS W/ TWL XL LVL3 (GOWN DISPOSABLE) ×2 IMPLANT
GOWN STRL REUS W/TWL 2XL LVL3 (GOWN DISPOSABLE) IMPLANT
GOWN STRL REUS W/TWL LRG LVL3 (GOWN DISPOSABLE)
GOWN STRL REUS W/TWL XL LVL3 (GOWN DISPOSABLE) ×12
KIT BASIN OR (CUSTOM PROCEDURE TRAY) ×3 IMPLANT
KIT ROOM TURNOVER OR (KITS) ×3 IMPLANT
MILL MEDIUM DISP (BLADE) ×3 IMPLANT
NEEDLE HYPO 22GX1.5 SAFETY (NEEDLE) ×3 IMPLANT
NS IRRIG 1000ML POUR BTL (IV SOLUTION) ×3 IMPLANT
PACK LAMINECTOMY NEURO (CUSTOM PROCEDURE TRAY) ×3 IMPLANT
ROD RADIUS 40MM (Neuro Prosthesis/Implant) ×6 IMPLANT
ROD SPNL 40X5.5XNS TI RDS (Neuro Prosthesis/Implant) IMPLANT
SCREW 5.75X40M (Screw) ×8 IMPLANT
SPACER SPNL STD 23X8XSTRL (Cage) IMPLANT
SPCR SPNL STD 23X8XSTRL (Cage) ×2 IMPLANT
SPONGE SURGIFOAM ABS GEL 100 (HEMOSTASIS) ×3 IMPLANT
STRIP CLOSURE SKIN 1/2X4 (GAUZE/BANDAGES/DRESSINGS) ×3 IMPLANT
SUT VIC AB 0 CT1 18XCR BRD8 (SUTURE) ×2 IMPLANT
SUT VIC AB 0 CT1 8-18 (SUTURE) ×6
SUT VIC AB 2-0 CT1 18 (SUTURE) IMPLANT
SUT VIC AB 3-0 SH 8-18 (SUTURE) ×4 IMPLANT
TOWEL OR 17X24 6PK STRL BLUE (TOWEL DISPOSABLE) ×3 IMPLANT
TOWEL OR 17X26 10 PK STRL BLUE (TOWEL DISPOSABLE) ×3 IMPLANT
TRAY FOLEY W/METER SILVER 16FR (SET/KITS/TRAYS/PACK) ×3 IMPLANT
WATER STERILE IRR 1000ML POUR (IV SOLUTION) ×3 IMPLANT

## 2015-11-07 NOTE — H&P (Signed)
Heidi Campbell is an 70 y.o. female.   Chief Complaint: Back pain HPI: 70 year old female with progressive back and bilateral lower extremity pain failing conservative management. 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 Patient presents now for L3 for decompression infusion in hopes of improving her symptoms.  Past Medical History:  Diagnosis Date  . ALLERGIC RHINITIS   . Arthritis   . ASTHMA   . Diabetes mellitus without complication (HCC)   . Esophageal reflux   . Hypertension   . SINUSITIS, ACUTE     Past Surgical History:  Procedure Laterality Date  . ABDOMINAL HYSTERECTOMY    . arthroscopic knee Right 2015  . BREAST REDUCTION SURGERY Bilateral 2006  . BREAST SURGERY    . HAND SURGERY Left    damaged nerve in hand    Family History  Problem Relation Age of Onset  . Diabetes Mother   . Heart attack Father   . Heart disease Father    Social History:  reports that she has never smoked. She has never used smokeless tobacco. She reports that she does not drink alcohol or use drugs.  Allergies:  Allergies  Allergen Reactions  . Ace Inhibitors Cough  . Shellfish Allergy Other (See Comments)    Pt cannot recall at this time    Medications Prior to Admission  Medication Sig Dispense Refill  . acetaminophen (TYLENOL 8 HOUR ARTHRITIS PAIN) 650 MG CR tablet Take 650 mg by mouth every 8 (eight) hours as needed for pain.    Marland Kitchen. albuterol (PROVENTIL HFA;VENTOLIN HFA) 108 (90 BASE) MCG/ACT inhaler Inhale 2 puffs into the lungs every 6 (six) hours as needed for wheezing or shortness of breath.    Marland Kitchen. amLODipine (NORVASC) 10 MG tablet Take 1 tablet (10 mg total) by mouth daily. 30 tablet 1  . aspirin 81 MG tablet Take 81 mg by mouth daily.    . cloNIDine (CATAPRES) 0.2 MG tablet Take 0.2 mg by mouth 2 (two) times daily.     Marland Kitchen. doxazosin (CARDURA) 4 MG tablet take 1 TABLET AT BEDTIME (Patient taking  differently: Take 4 mg by mouth at bedtime. ) 30 tablet 3  . ezetimibe (ZETIA) 10 MG tablet Take 1 tablet (10 mg total) by mouth daily. 30 tablet 3  . fluticasone (FLONASE) 50 MCG/ACT nasal spray Place 1 spray into both nostrils daily as needed for allergies.     . Fluticasone-Salmeterol (ADVAIR) 100-50 MCG/DOSE AEPB Inhale 1 puff into the lungs as needed (ASTHMA).     Marland Kitchen. glucose blood (ONETOUCH VERIO) test strip TEST BLOOD SUGAR twice a day Dx code E11.9 100 each 3  . hydrALAZINE (APRESOLINE) 50 MG tablet Take 1 tablet by mouth 3 times a day. (Patient taking differently: Take 50 mg by mouth 3 (three) times daily. ) 90 tablet 11  . insulin aspart (NOVOLOG FLEXPEN) 100 UNIT/ML FlexPen Inject 15 units twice a day (Patient taking differently: Inject 10 Units into the skin 3 (three) times daily with meals. ) 15 mL 3  . Insulin Glargine (LANTUS) 100 UNIT/ML Solostar Pen Inject 34 Units into the skin daily at 10 pm. 15 mL 2  . irbesartan-hydrochlorothiazide (AVALIDE) 300-12.5 MG per tablet Take 0.5 tablets by mouth daily.    . Liraglutide (VICTOZA) 18 MG/3ML SOPN inject 1.2 milligram subcutaneously daily (Patient taking differently: Inject 1.2 mg into the skin daily. ) 6 mL 3  . metFORMIN (GLUCOPHAGE) 1000 MG tablet Take 1  tablet (1,000 mg total) by mouth 2 (two) times daily with a meal. 60 tablet 3  . methocarbamol (ROBAXIN) 500 MG tablet Take 1 tablet (500 mg total) by mouth 2 (two) times daily. (Patient taking differently: Take 500 mg by mouth daily. ) 20 tablet 0  . omeprazole (PRILOSEC) 20 MG capsule Take 20 mg by mouth daily.    Marland Kitchen oxyCODONE-acetaminophen (PERCOCET/ROXICET) 5-325 MG tablet Take 2 tablets by mouth every 4 (four) hours as needed for severe pain. (Patient taking differently: Take 1 tablet by mouth every 4 (four) hours as needed for severe pain. ) 6 tablet 0  . polyethylene glycol (MIRALAX / GLYCOLAX) packet Take 17 g by mouth daily as needed for moderate constipation.     . rosuvastatin  (CRESTOR) 40 MG tablet Take 1 tablet (40 mg total) by mouth daily. 30 tablet 6  . spironolactone (ALDACTONE) 50 MG tablet take 1 tablet by mouth once daily 30 tablet 5  . Insulin Pen Needle (BD PEN NEEDLE NANO U/F) 32G X 4 MM MISC Use 4 per day (Patient not taking: Reported on 10/29/2015) 130 each 3  . ondansetron (ZOFRAN ODT) 4 MG disintegrating tablet Take 1 tablet (4 mg total) by mouth every 8 (eight) hours as needed for nausea or vomiting. 20 tablet 0  . ONETOUCH DELICA LANCETS 33G MISC Use to check blood sugar twice a day dx code E11.9 (Patient not taking: Reported on 10/29/2015) 100 each 3    Results for orders placed or performed during the hospital encounter of 11/07/15 (from the past 48 hour(s))  Glucose, capillary     Status: Abnormal   Collection Time: 11/07/15  7:06 AM  Result Value Ref Range   Glucose-Capillary 217 (H) 65 - 99 mg/dL   Comment 1 Notify RN    Comment 2 Document in Chart    No results found.    Blood pressure (!) 175/69, pulse 72, temperature 98 F (36.7 C), temperature source Oral, resp. rate 20, weight 104.8 kg (231 lb), SpO2 100 %. Patient is awake and alert. She is oriented and appropriate. Cranial nerve function is intact. Her motor examination of her extremities is normal except for some slight weakness of her left quadriceps. Sensory examination nonfocal. Deep tendon reverse hypoactive but symmetric. No evidence of long track signs. Examination head ears eyes and throat unremarkable. Chest and abdomen are benign. Extremities are free from injury deformity.  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 Plan bilateral L3-4 decompressive laminotomies and foraminotomies followed by posterior lumbar interbody fusion utilizing interbody peek cages, locally harvested autograft, and augmented with posterior lateral arthrodesis utilizing nonsegmental pedicle screw fixation and local autografting. Risks and benefits been explained. Patient wishes  to proceed.   [name] A 11/07/2015, 7:43 AM

## 2015-11-07 NOTE — Progress Notes (Signed)
Inpatient Diabetes Program Recommendations  AACE/ADA: New Consensus Statement on Inpatient Glycemic Control (2015)  Target Ranges:  Prepandial:   less than 140 mg/dL      Peak postprandial:   less than 180 mg/dL (1-2 hours)      Critically ill patients:  140 - 180 mg/dL   Results for Heidi Campbell, [name] (MRN [id number]) as of 11/07/2015 13:48  Ref. Range 11/07/2015 07:06 11/07/2015 10:44 11/07/2015 11:42  Glucose-Capillary Latest Ref Range: 65 - 99 mg/dL 409217 (H) 811251 (H) 914305 (H)    Admit with: L3-L4 degenerative spondylolisthesis with stenosis  History: DM  Home DM Meds: Lantus 34 units QHS       Novolog 10 units tidwc       Victoza 1.2 mg daily       Metformin 1000 mg bid  Current Insulin Orders: Lantus 34 units QHS                 Novolog 10 units tidwc                 Victoza 1.2 mg daily                 Metformin 1000 mg bid     MD- Please consider also starting Novolog Moderate Correction Scale/ SSI (0-15 units) TID AC + HS      --Will follow patient during hospitalization--  Ambrose FinlandJeannine Johnston [name] RN, MSN, CDE Diabetes Coordinator Inpatient Glycemic Control Team Team Pager: [phone] (8a-5p)

## 2015-11-07 NOTE — Anesthesia Postprocedure Evaluation (Signed)
Anesthesia Post Note  Patient: Heidi Campbell  Procedure(s) Performed: Procedure(s) (LRB): POSTERIOR LUMBAR INTERBODY FUSION - LUMBAR THREE - LUMBAR FOUR (N/A)  Patient location during evaluation: PACU Anesthesia Type: General Level of consciousness: awake and alert Pain management: pain level controlled Vital Signs Assessment: post-procedure vital signs reviewed and stable Respiratory status: spontaneous breathing, nonlabored ventilation, respiratory function stable and patient connected to nasal cannula oxygen Cardiovascular status: blood pressure returned to baseline and stable Postop Assessment: no signs of nausea or vomiting Anesthetic complications: no    Last Vitals:  Vitals:   11/07/15 1112 11/07/15 1141  BP:  (!) 141/59  Pulse: (!) 59 60  Resp: 13 16  Temp:  36.9 C    Last Pain:  Vitals:   11/07/15 1154  TempSrc:   PainSc: Asleep    LLE Motor Response: Purposeful movement (11/07/15 1154) LLE Sensation: Full sensation;No numbness;No tingling;No pain (11/07/15 1154) RLE Motor Response: Purposeful movement (11/07/15 1154) RLE Sensation: Full sensation;No numbness;No pain;No tingling (11/07/15 1154)      Phillips Groutarignan, Lai Hendriks

## 2015-11-07 NOTE — Progress Notes (Signed)
RN and NT ambulated patient to door and back to bed. Patient very hesitant to ambulate. Stand by assist with front wheel walker. Patient requested meal, tray ordered. She has been tolerating ice chips and water.

## 2015-11-07 NOTE — Anesthesia Preprocedure Evaluation (Addendum)
Anesthesia Evaluation  Patient identified by MRN, date of birth, ID band Patient awake    Reviewed: Allergy & Precautions, NPO status , Patient's Chart, lab work & pertinent test results  Airway Mallampati: III  TM Distance: >3 FB Neck ROM: Full    Dental no notable dental hx. (+) Teeth Intact, Dental Advisory Given   Pulmonary asthma ,    Pulmonary exam normal breath sounds clear to auscultation       Cardiovascular hypertension, Pt. on medications Normal cardiovascular exam Rhythm:Regular Rate:Normal     Neuro/Psych negative neurological ROS  negative psych ROS   GI/Hepatic negative GI ROS, Neg liver ROS, GERD  Medicated and Controlled,  Endo/Other  diabetes, Poorly Controlled, Type 2, Insulin Dependent, Oral Hypoglycemic AgentsMorbid obesity  Renal/GU negative Renal ROS  negative genitourinary   Musculoskeletal negative musculoskeletal ROS (+)   Abdominal   Peds negative pediatric ROS (+)  Hematology negative hematology ROS (+)   Anesthesia Other Findings   Reproductive/Obstetrics negative OB ROS                            Anesthesia Physical Anesthesia Plan  ASA: III  Anesthesia Plan: General   Post-op Pain Management:    Induction: Intravenous  Airway Management Planned: Oral ETT  Additional Equipment:   Intra-op Plan:   Post-operative Plan: Extubation in OR  Informed Consent: I have reviewed the patients History and Physical, chart, labs and discussed the procedure including the risks, benefits and alternatives for the proposed anesthesia with the patient or authorized representative who has indicated his/her understanding and acceptance.   Dental advisory given  Plan Discussed with: CRNA  Anesthesia Plan Comments:         Anesthesia Quick Evaluation

## 2015-11-07 NOTE — Progress Notes (Signed)
Orthopedic Tech Progress Note Patient Details:  Orson Aloevon V Malkowski 1945/04/06 [id number]  Patient ID: Orson AloeEvon V Bonny, female   DOB: 1945/04/06, 70 y.o.   MRN: [id number]   Nikki DomCrawford, Jennye Runquist Called in bio-tech brace order; spoke with Cordelia PenSherry

## 2015-11-07 NOTE — Op Note (Signed)
Date of procedure: 11/07/2015  Date of dictation: Same  Service: Neurosurgery  Preoperative diagnosis: L3-L4 degenerative spondylolisthesis with stenosis  Postoperative diagnosis: Same  Procedure Name: Bilateral L3-L4 decompressive laminotomies and foraminotomies of bilateral L3 and L4 nerve roots, more than would be required for simple interbody fusion alone.  L3-4 posterior lumbar interbody fusion utilizing expandable cages and local autograft  L3-L4 posterior lateral arthrodesis utilizing nonsegmental pedicle screw fixation and local autograft  Surgeon:Dwayn Moravek A.Ladasia Sircy, M.D.  Asst. Surgeon: Yetta BarreJones  Anesthesia: General  Indication: 70 year old female with progressive back and bilateral lower extremity pain consistent with neurogenic claudication and bilateral L3 radiculopathies. Workup demonstrates evidence of an unstable grade 1 degenerative spondylolisthesis with stenosis at L3-4. Patient presents now for decompression and fusion in hopes of improving her symptoms.  Operative note: After induction of anesthesia, patient position prone onto Wilson frame and appropriately padded. Lumbar region prepped and draped sterilely. Incision made overlying L3-4. Dissection performed bilaterally. Retractors placed. Fluoroscopy used. Levels confirmed. Bilateral decompressive laminotomies performed using high-speed drill and Kerrison rongeurs to remove the inferior aspect of the lamina of L3 the entire inferior facet of L3 the majority the superior facet of L4 and the superior rim of the L4 lamina. Ligament flavum was elevated and resected. Underlying thecal sac and L3 and L4 nerve roots were identified. Wide decompressive foraminotomies were performed along the course the exiting L3 and L4 nerve roots bilaterally. Bilateral discectomies performed at L3-4. Disc space prepared for interbody fusion. Disc space distracted on the right side. Disc space prepared for interbody fusion on the left. A 8 mm standard  expandable Medtronic cage packed with locally harvested autograft was impacted in place and expanded to its full extent. Distractor was removed and patient's contralateral side. Disc space once again prepared for interbody fusion. Slides autograft packed into the interspace. A second cage was then impacted in place and expanded to its full extent. Pedicles of L4 and L5 were then verified using surface landmarks and intraoperative fluoroscopy. Superficial bone overlying the pedicle was removed using high-speed drill. Each pedicles and probed using a pedicle awl each pedicle awl track was then tapped and each screw tap hole was probed and found to be solidly within bone. 5.75 mm radius brand screws from [employer] were placed bilaterally at L3 and L4. Final images were taken revealing good position the bone grafts and hardware at the proper operative level with normal alignment is spine. Wound is then irrigated one final time. Titanium rods were placed over the screws. Locking caps and placed or the screw heads and then engaged with the construct under compression. Morselized autograft was packed posterior laterally for later fusion. Gelfoam was placed over the laminotomy defects. Vancomycin powder was placed the deep wound space. Wounds and close in layers with Vicryl sutures. Steri-Strips and sterile dressing were applied. No apparent complications. Patient tolerated the procedure well and she returns to the recovery room postop.

## 2015-11-07 NOTE — Anesthesia Preprocedure Evaluation (Signed)
Anesthesia Evaluation    Airway        Dental   Pulmonary asthma ,           Cardiovascular hypertension, Pt. on medications      Neuro/Psych    GI/Hepatic   Endo/Other  diabetes, Type 2, Oral Hypoglycemic AgentsMorbid obesity  Renal/GU      Musculoskeletal   Abdominal   Peds  Hematology   Anesthesia Other Findings   Reproductive/Obstetrics                             Anesthesia Physical Anesthesia Plan Anesthesia Quick Evaluation

## 2015-11-07 NOTE — Brief Op Note (Signed)
11/07/2015  10:18 AM  PATIENT:  Orson AloeEvon V Atallah  70 y.o. female  PRE-OPERATIVE DIAGNOSIS:  Spondylolisthesis  POST-OPERATIVE DIAGNOSIS:  Spondylolisthesis  PROCEDURE:  Procedure(s): POSTERIOR LUMBAR INTERBODY FUSION - LUMBAR THREE - LUMBAR FOUR (N/A)  SURGEON:  Surgeon(s) and Role:    * Julio SicksHenry Keyshla Tunison, MD - Primary    * Tia Alertavid S Jones, MD  PHYSICIAN ASSISTANT:   ASSISTANTS:    ANESTHESIA:   general  EBL:  Total I/O In: 2000 [I.V.:2000] Out: 300 [Urine:50; Blood:250]  BLOOD ADMINISTERED:none  DRAINS: none   LOCAL MEDICATIONS USED:  MARCAINE     SPECIMEN:  No Specimen  DISPOSITION OF SPECIMEN:  N/A  COUNTS:  YES  TOURNIQUET:  * No tourniquets in log *  DICTATION: .Dragon Dictation  PLAN OF CARE: Admit to inpatient   PATIENT DISPOSITION:  PACU - hemodynamically stable.   Delay start of Pharmacological VTE agent (>24hrs) due to surgical blood loss or risk of bleeding: yes

## 2015-11-07 NOTE — Anesthesia Procedure Notes (Signed)
Procedure Name: Intubation Date/Time: 11/07/2015 8:07 AM Performed by: Romie MinusOCK, Kinjal Neitzke K Pre-anesthesia Checklist: Patient identified, Emergency Drugs available, Suction available and Patient being monitored Patient Re-evaluated:Patient Re-evaluated prior to inductionOxygen Delivery Method: Circle System Utilized Preoxygenation: Pre-oxygenation with 100% oxygen Intubation Type: IV induction Ventilation: Mask ventilation without difficulty Laryngoscope Size: Miller and 2 Grade View: Grade I Tube type: Oral Tube size: 7.0 mm Number of attempts: 1 Airway Equipment and Method: Stylet and Oral airway Placement Confirmation: ETT inserted through vocal cords under direct vision,  positive ETCO2 and breath sounds checked- equal and bilateral Secured at: 21 cm Tube secured with: Tape Dental Injury: Teeth and Oropharynx as per pre-operative assessment

## 2015-11-07 NOTE — Progress Notes (Signed)
Patient admitted to 5C16. Patient is drowsy, easy to arouse, oriented x4, moving all extremities well, equal grips and dorsi/plantar flexion. Honeycomb dressing is clean, dry, intact. Foley intact, below level of bladder. Attempted to get patient to side of bed, patient is too drowsy, will attempt when patient is more alert. [employer] paged for brace. Will continue to monitor.

## 2015-11-07 NOTE — Transfer of Care (Signed)
Immediate Anesthesia Transfer of Care Note  Patient: Heidi Campbell  Procedure(s) Performed: Procedure(s): POSTERIOR LUMBAR INTERBODY FUSION - LUMBAR THREE - LUMBAR FOUR (N/A)  Patient Location: PACU  Anesthesia Type:General  Level of Consciousness: awake, oriented and patient cooperative  Airway & Oxygen Therapy: Patient Spontanous Breathing and Patient connected to face mask oxygen  Post-op Assessment: Report given to RN and Post -op Vital signs reviewed and stable  Post vital signs: Reviewed  Last Vitals:  Vitals:   11/07/15 0709 11/07/15 1036  BP: (!) 175/69   Pulse: 72   Resp: 20   Temp: 36.7 C 36.8 C    Last Pain:  Vitals:   11/07/15 0714  TempSrc:   PainSc: 6       Patients Stated Pain Goal: 1 (11/07/15 0714)  Complications: No apparent anesthesia complications

## 2015-11-08 LAB — GLUCOSE, CAPILLARY
Glucose-Capillary: 250 mg/dL — ABNORMAL HIGH (ref 65–99)
Glucose-Capillary: 220 mg/dL — ABNORMAL HIGH (ref 65–99)
Glucose-Capillary: 193 mg/dL — ABNORMAL HIGH (ref 65–99)
Glucose-Capillary: 161 mg/dL — ABNORMAL HIGH (ref 65–99)

## 2015-11-08 NOTE — Progress Notes (Signed)
Patient ID: Heidi Campbell, female   DOB: 1945/06/04, 70 y.o.   MRN: [id number] Vital signs are stable Motor function appears intact Patient notes it she had fair amount of pain last night Pain seems to be better this morning She is getting out of bed to the chair Mobilization is slow secondary to pain and morbid obesity

## 2015-11-08 NOTE — Evaluation (Signed)
Physical Therapy Evaluation Patient Details Name: Heidi Campbell MRN: [id number] DOB: 08/09/1945 Today's Date: 11/08/2015   History of Present Illness  s/p POSTERIOR LUMBAR INTERBODY FUSION - LUMBAR THREE - LUMBAR FOUR 11/07/15. PMH includes DM, HTN, previous R knee and R hand surgeries in 2015.  Clinical Impression  Pt lethargic this afternoon.  Made several attempts in to see pt prior to success.  Pt will have good family support upon discharge home.  She has h/o fall while navigating stairs at home, and she therefore added more external support to reduce fall risk including grab bars in bathroom. Will continue to follow patient while on this venue of care to progress mobility.    Follow Up Recommendations Home health PT;Supervision for mobility/OOB    Equipment Recommendations  Rolling walker with 5" wheels;3in1 (PT)    Recommendations for Other Services       Precautions / Restrictions Precautions Precautions: Back;Fall Precaution Booklet Issued: No Precaution Comments: educated pt on precautions with return verbal understanding Required Braces or Orthoses: Spinal Brace Spinal Brace: Applied in sitting position Restrictions Weight Bearing Restrictions: No      Mobility  Bed Mobility Overal bed mobility: Needs Assistance Bed Mobility: Rolling;Supine to Sit Rolling: Min assist   Supine to sit: Mod assist     General bed mobility comments: with use of Rt rail  Transfers Overall transfer level: Needs assistance Equipment used: Rolling walker (2 wheeled) Transfers: Sit to/from [employer] Sit to Stand: Min assist Stand pivot transfers: Min guard       General transfer comment: min cues for hand placement  Ambulation/Gait Ambulation/Gait assistance: Min guard Ambulation Distance (Feet): 150 Feet Assistive device: Rolling walker (2 wheeled) Gait Pattern/deviations: Decreased step length - right;Decreased step length - left     General Gait  Details: 1 standing rest break to manage fatigue  Stairs            Wheelchair Mobility    Modified Rankin (Stroke Patients Only)       Balance Overall balance assessment: Needs assistance Sitting-balance support: No upper extremity supported;Feet supported Sitting balance-Leahy Scale: Good     Standing balance support: Bilateral upper extremity supported Standing balance-Leahy Scale: Poor                               Pertinent Vitals/Pain Pain Assessment: 0-10 Pain Score: 4  Pain Location: back Pain Descriptors / Indicators: Aching;Sore Pain Intervention(s): Limited activity within patient's tolerance;Monitored during session;Premedicated before session    Home Living Family/patient expects to be discharged to:: Private residence Living Arrangements: Children Available Help at Discharge: Family;Available 24 hours/day Type of Home: House Home Access: Stairs to enter Entrance Stairs-Rails: Can reach both Entrance Stairs-Number of Steps: 2 Home Layout: One level Home Equipment: Grab bars - tub/shower;Grab bars - toilet;Shower seat - built in;Crutches      Prior Function Level of Independence: Independent         Comments: lives alone, family to assist short term until pt independent     Hand Dominance        Extremity/Trunk Assessment   Upper Extremity Assessment: Overall WFL for tasks assessed           Lower Extremity Assessment: Generalized weakness      Cervical / Trunk Assessment: Normal  Communication   Communication: No difficulties  Cognition Arousal/Alertness: Lethargic;Suspect due to medications Behavior During Therapy: [facility] for tasks assessed/performed Overall Cognitive Status:  Within Functional Limits for tasks assessed                      General Comments General comments (skin integrity, edema, etc.): mod assist to don brace in sitting    Exercises     Assessment/Plan    PT Assessment Patient  needs continued PT services  PT Problem List Decreased strength;Decreased activity tolerance;Decreased balance;Decreased mobility;Decreased knowledge of use of DME;Decreased knowledge of precautions;Pain          PT Treatment Interventions DME instruction;Gait training;Stair training;Functional mobility training;Therapeutic activities;Therapeutic exercise;Balance training;Neuromuscular re-education;Patient/family education    PT Goals (Current goals can be found in the Care Plan section)  Acute Rehab PT Goals Patient Stated Goal: manage pain PT Goal Formulation: With patient Time For Goal Achievement: 11/14/15 Potential to Achieve Goals: Good    Frequency Min 5X/week   Barriers to discharge        Co-evaluation               End of Session Equipment Utilized During Treatment: Gait belt;Back brace Activity Tolerance: Patient tolerated treatment well;No increased pain Patient left: in chair;with call bell/phone within reach Nurse Communication: Mobility status         Time: [id number] PT Time Calculation (min) (ACUTE ONLY): 28 min   Charges:   PT Evaluation $PT Eval Low Complexity: 1 Procedure     PT G CodesNestor Lewandowsky:       Walden Statz M Dann Ventress, PT [phone]  Geralene Afshar 11/08/2015, 5:47 PM

## 2015-11-08 NOTE — Plan of Care (Signed)
Problem: Pain Management: Goal: Pain level will decrease Outcome: Progressing Medicated with prn analgesia twice today

## 2015-11-08 NOTE — Evaluation (Signed)
Occupational Therapy Evaluation Patient Details Name: Heidi Campbell MRN: [id number] DOB: [birth year] Today's Date: 11/08/2015    History of Present Illness s/p POSTERIOR LUMBAR INTERBODY FUSION - LUMBAR THREE - LUMBAR FOUR. PMH includes DM, HTN, previous R knee and R hand surgeries in 2015.   Clinical Impression   Pt admitted with the above diagnoses and presents with below problem list. Pt will benefit from continued acute OT to address the below listed deficits and maximize independence with basic ADLs prior to d/c home with family assisting. PTA pt was independent with ADLs. Pt is currently min guard with functional mobility/transfers; min A with LB ADLs. Session details below.     Follow Up Recommendations  No OT follow up;Supervision/Assistance - 24 hour    Equipment Recommendations  3 in 1 bedside comode    Recommendations for Other Services PT consult     Precautions / Restrictions Precautions Precautions: Back;Fall Precaution Booklet Issued: Yes (comment) Precaution Comments: educated on BAT precautions Required Braces or Orthoses: Spinal Brace Spinal Brace: Applied in sitting position Restrictions Weight Bearing Restrictions: No      Mobility Bed Mobility               General bed mobility comments: up in chair  Transfers Overall transfer level: Needs assistance Equipment used: Rolling walker (2 wheeled) Transfers: Sit to/from Stand Sit to Stand: Min guard         General transfer comment: from recliner and 3n1. cues for hand placement    Balance Overall balance assessment: Needs assistance;History of Falls         Standing balance support: Bilateral upper extremity supported;During functional activity Standing balance-Leahy Scale: Poor Standing balance comment: external support for balance,  single extremity support during pericare.                            ADL Overall ADL's : Needs assistance/impaired Eating/Feeding: Set  up;Sitting   Grooming: Wash/dry hands;Min guard;Standing Grooming Details (indicate cue type and reason): discussed position of self and rw to support balance and maintain upright posture. Pt noted to lean on sink.  Upper Body Bathing: Set up;Sitting   Lower Body Bathing: Minimal assistance;Sit to/from stand   Upper Body Dressing : Set up;Sitting   Lower Body Dressing: Minimal assistance;Sit to/from stand   Toilet Transfer: Min guard;Ambulation;RW (3n1 over toilet)   Toileting- Clothing Manipulation and Hygiene: Minimal assistance;Sit to/from stand;Min guard Toileting - Clothing Manipulation Details (indicate cue type and reason): min guard for anterior pericare, likely min A for posterior pericare. Educated on techniques and AE. Tub/ Shower Transfer: Walk-in shower;Min guard;Ambulation;3 in 1;Rolling walker   Functional mobility during ADLs: Min guard;Rolling walker General ADL Comments: Pt completed toilet transfer and toileting tasks, washed hands at sink. ADL education provided     Vision     Perception     Praxis      Pertinent Vitals/Pain Pain Assessment: 0-10 Pain Score: 6  Pain Location: back Pain Descriptors / Indicators: Sore Pain Intervention(s): Limited activity within patient's tolerance;Monitored during session;Repositioned     Hand Dominance     Extremity/Trunk Assessment Upper Extremity Assessment Upper Extremity Assessment: Overall WFL for tasks assessed;RUE deficits/detail RUE Deficits / Details: R hand surgery in 2015 to repair damage from a fall. WFL grip strength. Pt reports occasional joint alignment problems.    Lower Extremity Assessment Lower Extremity Assessment: Defer to PT evaluation       Communication Communication  Communication: No difficulties   Cognition Arousal/Alertness: Awake/alert Behavior During Therapy: WFL for tasks assessed/performed Overall Cognitive Status: Within Functional Limits for tasks assessed                      General Comments       Exercises       Shoulder Instructions      Home Living Family/patient expects to be discharged to:: Private residence Living Arrangements: Alone Available Help at Discharge: Family;Available 24 hours/day Type of Home: House Home Access: Stairs to enter [employer] CorporationEntrance Stairs-Number of Steps: 2 Entrance Stairs-Rails: Left;Right Home Layout: One level     Bathroom Shower/Tub: Producer, television/film/videoWalk-in shower   Bathroom Toilet: Handicapped height     Home Equipment: Grab bars - tub/shower;Grab bars - toilet;Shower seat - built in;Crutches          Prior Functioning/Environment Level of Independence: Independent                 OT Problem List: Impaired balance (sitting and/or standing);Decreased knowledge of use of DME or AE;Decreased knowledge of precautions;Pain   OT Treatment/Interventions: Self-care/ADL training;DME and/or AE instruction;Therapeutic activities;Patient/family education;Balance training    OT Goals(Current goals can be found in the care plan section) Acute Rehab OT Goals Patient Stated Goal: not stated OT Goal Formulation: With patient Time For Goal Achievement: 11/15/15 Potential to Achieve Goals: Good ADL Goals Pt Will Perform Lower Body Bathing: with modified independence;with adaptive equipment;sit to/from stand Pt Will Perform Lower Body Dressing: with modified independence;with adaptive equipment;sit to/from stand Pt Will Transfer to Toilet: with modified independence;ambulating (3n1 over toilet) Pt Will Perform Toileting - Clothing Manipulation and hygiene: with modified independence;sitting/lateral leans;sit to/from stand;with adaptive equipment Pt Will Perform Tub/Shower Transfer: Shower transfer;with supervision;ambulating;shower seat;3 in 1;rolling walker Additional ADL Goal #1: Pt will complete bed mobility at mod I level to prepare for OOB ADLs.   OT Frequency: Min 2X/week   Barriers to D/C:            Co-evaluation               End of Session Equipment Utilized During Treatment: Gait belt;Rolling walker;Back brace  Activity Tolerance: Patient tolerated treatment well Patient left: in chair;with call bell/phone within reach;with chair alarm set   Time: 0950-1020 OT Time Calculation (min): 30 min Charges:  OT General Charges $OT Visit: 1 Procedure OT Evaluation $OT Eval Low Complexity: 1 Procedure OT Treatments $Self Care/Home Management : 8-22 mins G-Codes:    Pilar GrammesMathews, Delmus Warwick H 11/08/2015, 10:40 AM

## 2015-11-09 LAB — GLUCOSE, CAPILLARY
GLUCOSE-CAPILLARY: 151 mg/dL — AB (ref 65–99)
GLUCOSE-CAPILLARY: 214 mg/dL — AB (ref 65–99)
Glucose-Capillary: 156 mg/dL — ABNORMAL HIGH (ref 65–99)
Glucose-Capillary: 242 mg/dL — ABNORMAL HIGH (ref 65–99)

## 2015-11-09 NOTE — Progress Notes (Signed)
Physical Therapy Treatment Patient Details Name: Heidi Campbell MRN: [id number] DOB: 15-Dec-1945 Today's Date: 11/09/2015    History of Present Illness s/p POSTERIOR LUMBAR INTERBODY FUSION - LUMBAR THREE - LUMBAR FOUR 11/07/15. PMH includes DM, HTN, previous R knee and R hand surgeries in 2015.    PT Comments    Progress limited this session by fatigue, sleepiness, and [name] describes feeling overall "weak"; BP standing after amb and one seated rest break 115/48, which is not entirely different from recent BPs taken, but still notable for decr diastolic BP; agree with another day inpatient; OT updated on status  Follow Up Recommendations  Home health PT;Supervision for mobility/OOB     Equipment Recommendations  Rolling walker with 5" wheels;3in1 (PT)    Recommendations for Other Services       Precautions / Restrictions Precautions Precautions: Back;Fall Precaution Booklet Issued: Yes (comment) Precaution Comments: educated pt on precautions with return verbal understanding Required Braces or Orthoses: Spinal Brace Spinal Brace: Applied in sitting position    Mobility  Bed Mobility Overal bed mobility: Needs Assistance Bed Mobility: Sit to Sidelying         Sit to sidelying: Mod assist General bed mobility comments: Mod assist to help LEs into bed  Transfers Overall transfer level: Needs assistance Equipment used: Rolling walker (2 wheeled) Transfers: Sit to/from Stand Sit to Stand: Min assist;Min guard         General transfer comment: min cues for hand placement, scoot to front of chair, and get feet closer to center of mass in prep for standing; initially needing min assist to power up from recliner; minguard for safety coming to stnd from 3in1 and bed  Ambulation/Gait Ambulation/Gait assistance: Min guard Ambulation Distance (Feet): 25 Feet Assistive device: Rolling walker (2 wheeled) Gait Pattern/deviations: Step-through pattern;Decreased step length  - right;Decreased step length - left   Gait velocity interpretation: Below normal speed for age/gender General Gait Details: Cues to self-monitor for activity tolerance; very slow moving and describing feeling "weak" and like she is going to "pass out" so did not walk as far today; eyes closed approx 40% of walk   Stairs            Wheelchair Mobility    Modified Rankin (Stroke Patients Only)       Balance                                    Cognition Arousal/Alertness: Awake/alert (reports feels "sleepy") Behavior During Therapy: WFL for tasks assessed/performed Overall Cognitive Status: Within Functional Limits for tasks assessed                      Exercises      General Comments        Pertinent Vitals/Pain Pain Assessment: Faces Faces Pain Scale: Hurts little more Pain Location: Back pain at surgical site with moving; no rediating pain Pain Descriptors / Indicators: Grimacing Pain Intervention(s): Monitored during session;Repositioned    Home Living                      Prior Function            PT Goals (current goals can now be found in the care plan section) Acute Rehab PT Goals Patient Stated Goal: manage pain PT Goal Formulation: With patient Time For Goal Achievement: 11/14/15 Potential to Achieve Goals: Good  Progress towards PT goals: Not progressing toward goals - comment (limited by extreme fatigue)    Frequency    Min 5X/week      PT Plan Current plan remains appropriate    Co-evaluation             End of Session Equipment Utilized During Treatment: Back brace Activity Tolerance: Patient limited by fatigue Patient left: in bed;with call bell/phone within reach     Time: 1031-1056 PT Time Calculation (min) (ACUTE ONLY): 25 min  Charges:  $Gait Training: 8-22 mins $Therapeutic Activity: 8-22 mins                    G Codes:      Olen Pel 11/09/2015, 11:26 AM  Van Clines, PT  Acute Rehabilitation Services Pager [phone] Office [phone]

## 2015-11-09 NOTE — Progress Notes (Signed)
Patient ID: Heidi Campbell, female   DOB: Apr 16, 1945, 70 y.o.   MRN: [id number] Doing okay. Still has appropriate back pain but no leg pain. No numbness tingling or weakness. Moving her legs well. Out of bed to chair at present. Working with physical therapy. Likely needs 1 more day here. Maybe home tomorrow.

## 2015-11-09 NOTE — Progress Notes (Signed)
OT Cancellation Note  Patient Details Name: Heidi Campbell MRN: [id number] DOB: 1945-12-11   Cancelled Treatment:    Reason Eval/Treat Not Completed: Fatigue/lethargy limiting ability to participate;Pain limiting ability to participate. Pt declining to work with OT at this time despite max encouragement. Pt reports "I cannot keep my eyes open" but pt also requesting pain medication, RN notified. Will follow up tomorrow as time allows.   Gaye AlkenBailey A Valery Chance M.S., OTR/L Pager: [phone]  11/09/2015, 4:02 PM

## 2015-11-10 LAB — GLUCOSE, CAPILLARY
GLUCOSE-CAPILLARY: 114 mg/dL — AB (ref 65–99)
GLUCOSE-CAPILLARY: 140 mg/dL — AB (ref 65–99)
Glucose-Capillary: 124 mg/dL — ABNORMAL HIGH (ref 65–99)

## 2015-11-10 MED ORDER — DIAZEPAM 5 MG PO TABS: 5.0000 mg | ORAL_TABLET | Freq: Four times a day (QID) | ORAL | 0 refills | Status: DC | PRN

## 2015-11-10 MED ORDER — OXYCODONE-ACETAMINOPHEN 5-325 MG PO TABS: 1.0000 | ORAL_TABLET | ORAL | 0 refills | Status: DC | PRN

## 2015-11-10 NOTE — Progress Notes (Signed)
Discharge instruction reviewed with patient/family. All questions answered at this time. RXs given to patient. Transport home by family.   Rejina Odle, RN.

## 2015-11-10 NOTE — Care Management Important Message (Signed)
Important Message  Patient Details  Name: Heidi Campbell MRN: [id number] Date of Birth: Jun 14, 1945   Medicare Important Message Given:  Yes    Azure Barrales Stefan ChurchBratton 11/10/2015, 11:31 AM

## 2015-11-10 NOTE — Progress Notes (Signed)
Physical Therapy Treatment Patient Details Name: Heidi Campbell MRN: [id number] DOB: [birth year] Today's Date: 11/10/2015    History of Present Illness s/p POSTERIOR LUMBAR INTERBODY FUSION - LUMBAR THREE - LUMBAR FOUR 11/07/15. PMH includes DM, HTN, previous R knee and R hand surgeries in 2015.    PT Comments    Progressing well,  Still slow, mildly unsteady with turns and backing up to toilet.  Emphasized education with pt and son. Could benefit from HHPT.  Follow Up Recommendations  Home health PT;Supervision for mobility/OOB     Equipment Recommendations  Other (comment) (pt states pt has RW and that she really doesn't need a BSC.)    Recommendations for Other Services       Precautions / Restrictions Precautions Precautions: Back;Fall Precaution Booklet Issued: Yes (comment) Precaution Comments: educated pt on precautions with return verbal understanding Required Braces or Orthoses: Spinal Brace Spinal Brace: Applied in sitting position    Mobility  Bed Mobility               General bed mobility comments: up in the recliner  Transfers Overall transfer level: Needs assistance Equipment used: Rolling walker (2 wheeled) Transfers: Sit to/from Stand Sit to Stand: Min guard         General transfer comment: min cues for hand placement, scoot to front of chair, and get feet closer to center of mass in prep for standing  Ambulation/Gait Ambulation/Gait assistance: Supervision Ambulation Distance (Feet): 150 Feet Assistive device: Rolling walker (2 wheeled) Gait Pattern/deviations: Step-through pattern   Gait velocity interpretation: Below normal speed for age/gender General Gait Details: slow, but steady   Careers information officertairs            Wheelchair Mobility    Modified Rankin (Stroke Patients Only)       Balance Overall balance assessment: Needs assistance   Sitting balance-Leahy Scale: Good       Standing balance-Leahy Scale: Poor Standing balance  comment: still reliant on external support or AD                    Cognition Arousal/Alertness: Awake/alert Behavior During Therapy: WFL for tasks assessed/performed Overall Cognitive Status: Within Functional Limits for tasks assessed                      Exercises      General Comments General comments (skin integrity, edema, etc.): pt and son instructed in back care/prec, log roll/transitions, lifting precautions, bracing issues, progression of activity.      Pertinent Vitals/Pain Pain Assessment: Faces Faces Pain Scale: Hurts little more Pain Location: back Pain Descriptors / Indicators: Aching    Home Living                      Prior Function            PT Goals (current goals can now be found in the care plan section) Acute Rehab PT Goals Patient Stated Goal: manage pain PT Goal Formulation: With patient Time For Goal Achievement: 11/14/15 Potential to Achieve Goals: Good Progress towards PT goals: Progressing toward goals    Frequency    Min 5X/week      PT Plan Current plan remains appropriate    Co-evaluation             End of Session Equipment Utilized During Treatment: Back brace Activity Tolerance: Patient limited by fatigue;Patient tolerated treatment well Patient left: in bed;with call bell/phone within reach  Time: 1610-9604 PT Time Calculation (min) (ACUTE ONLY): 30 min  Charges:  $Gait Training: 8-22 mins $Therapeutic Activity: 8-22 mins                    G Codes:      Wessley Emert, Eliseo Gum 11/10/2015, 11:33 AM 11/10/2015  Hamden Bing, PT [phone]  (pager)

## 2015-11-10 NOTE — Care Management Note (Signed)
Case Management Note  Patient Details  Name: ADAMAE RICKLEFS MRN: [id number] Date of Birth: 1945/02/08  Subjective/Objective:                    Action/Plan: Pt discharging home with orders for DME and [facility] services. CM met with the patient and her son and provided them a list of [place] agencies in the [place] area. He selected [facility]. Santiago Glad with [facility] notified and accepted the referral. Pt with orders for walker and 3 in 1. Son states she has a walker at home and he has her bathroom already handicap accessible with bars and raised toilet, so they do not feel they need the 3 in 1. CM asked about 24 hour supervision and the son states he is working that out and it will not be a problem. He states he will take FMLA if needed. Bedside RN updated.   Expected Discharge Date:                  Expected Discharge Plan:  [place]  In-House Referral:     Discharge planning Services  CM Consult  Post Acute Care Choice:  Home Health, Durable Medical Equipment Choice offered to:  Patient, Adult Children  DME Arranged:   (pt did not need any equipment) DME Agency:     HH Arranged:  PT [place]:  [place]  Status of Service:  Completed, signed off  If discussed at [place] of Stay Meetings, dates discussed:    Additional Comments:  Pollie Friar, RN 11/10/2015, 2:02 PM

## 2015-11-10 NOTE — Discharge Instructions (Signed)

## 2015-11-10 NOTE — Discharge Summary (Signed)
Physician Discharge Summary  Patient ID: Heidi Campbell MRN: [id number] DOB/AGE: [birth year] 70 y.o.  Admit date: 11/07/2015 Discharge date: 11/10/2015  Admission Diagnoses:  Discharge Diagnoses:  Active Problems:   Degenerative spondylolisthesis   Discharged Condition: good  Hospital Course: Patient admitted to the hospital where she underwent uncomplicated L3 for decompression infusion. Postoperative she is progressing well. Ambulating without difficulty. Pain improved. Ready for discharge home.  Consults:   Significant Diagnostic Studies:   Treatments:   Discharge Exam: Blood pressure (!) 120/33, pulse 72, temperature 99.5 F (37.5 C), temperature source Oral, resp. rate 16, weight 104.8 kg (231 lb), SpO2 96 %. Awake and alert. Oriented and appropriate. Cranial nerve function intact. Motor and sensory function extremities normal. Wound clean and dry. Chest and abdomen benign.  Disposition: 01-Home or Self Care  Discharge Instructions    Face-to-face encounter (required for Medicare/Medicaid patients)    Complete by:  As directed    I Kathyann Spaugh A certify that this patient is under my care and that I, or a nurse practitioner or physician's assistant working with me, had a face-to-face encounter that meets the physician face-to-face encounter requirements with this patient on 11/10/2015. The encounter with the patient was in whole, or in part for the following medical condition(s) which is the primary reason for home health care (List medical condition): degenerative Spondylolithesis   The encounter with the patient was in whole, or in part, for the following medical condition, which is the primary reason for home health care:  degenerative Spondylolithesis   I certify that, based on my findings, the following services are medically necessary home health services:  Physical therapy   Reason for Medically Necessary Home Health Services:  Therapy- Home Adaptation to Facilitate Safety    My clinical findings support the need for the above services:  Unable to leave home safely without assistance and/or assistive device   Further, I certify that my clinical findings support that this patient is homebound due to:  Ambulates short distances less than 300 feet   Home Health    Complete by:  As directed    To provide the following care/treatments:   PT OT         Medication List    TAKE these medications   albuterol 108 (90 Base) MCG/ACT inhaler Commonly known as:  PROVENTIL HFA;VENTOLIN HFA Inhale 2 puffs into the lungs every 6 (six) hours as needed for wheezing or shortness of breath.   amLODipine 10 MG tablet Commonly known as:  NORVASC Take 1 tablet (10 mg total) by mouth daily.   aspirin 81 MG tablet Take 81 mg by mouth daily.   cloNIDine 0.2 MG tablet Commonly known as:  CATAPRES Take 0.2 mg by mouth 2 (two) times daily.   diazepam 5 MG tablet Commonly known as:  VALIUM Take 1-2 tablets (5-10 mg total) by mouth every 6 (six) hours as needed for muscle spasms.   doxazosin 4 MG tablet Commonly known as:  CARDURA take 1 TABLET AT BEDTIME What changed:  how much to take  how to take this  when to take this  additional instructions   ezetimibe 10 MG tablet Commonly known as:  ZETIA Take 1 tablet (10 mg total) by mouth daily.   fluticasone 50 MCG/ACT nasal spray Commonly known as:  FLONASE Place 1 spray into both nostrils daily as needed for allergies.   Fluticasone-Salmeterol 100-50 MCG/DOSE Aepb Commonly known as:  ADVAIR Inhale 1 puff into the lungs as needed (ASTHMA).  glucose blood test strip Commonly known as:  ONETOUCH VERIO TEST BLOOD SUGAR twice a day Dx code E11.9   hydrALAZINE 50 MG tablet Commonly known as:  APRESOLINE Take 1 tablet by mouth 3 times a day. What changed:  how much to take  how to take this  when to take this  additional instructions   insulin aspart 100 UNIT/ML FlexPen Commonly known as:  NOVOLOG  FLEXPEN Inject 15 units twice a day What changed:  how much to take  how to take this  when to take this  additional instructions   Insulin Glargine 100 UNIT/ML Solostar Pen Commonly known as:  LANTUS Inject 34 Units into the skin daily at 10 pm.   Insulin Pen Needle 32G X 4 MM Misc Commonly known as:  BD PEN NEEDLE NANO U/F Use 4 per day   irbesartan-hydrochlorothiazide 300-12.5 MG tablet Commonly known as:  AVALIDE Take 0.5 tablets by mouth daily.   Liraglutide 18 MG/3ML Sopn Commonly known as:  VICTOZA inject 1.2 milligram subcutaneously daily What changed:  how much to take  how to take this  when to take this  additional instructions   metFORMIN 1000 MG tablet Commonly known as:  GLUCOPHAGE Take 1 tablet (1,000 mg total) by mouth 2 (two) times daily with a meal.   methocarbamol 500 MG tablet Commonly known as:  ROBAXIN Take 1 tablet (500 mg total) by mouth 2 (two) times daily. What changed:  when to take this   omeprazole 20 MG capsule Commonly known as:  PRILOSEC Take 20 mg by mouth daily.   ondansetron 4 MG disintegrating tablet Commonly known as:  ZOFRAN ODT Take 1 tablet (4 mg total) by mouth every 8 (eight) hours as needed for nausea or vomiting.   ONETOUCH DELICA LANCETS 33G Misc Use to check blood sugar twice a day dx code E11.9   oxyCODONE-acetaminophen 5-325 MG tablet Commonly known as:  PERCOCET/ROXICET Take 1-2 tablets by mouth every 4 (four) hours as needed for severe pain. What changed:  how much to take   polyethylene glycol packet Commonly known as:  MIRALAX / GLYCOLAX Take 17 g by mouth daily as needed for moderate constipation.   rosuvastatin 40 MG tablet Commonly known as:  CRESTOR Take 1 tablet (40 mg total) by mouth daily.   spironolactone 50 MG tablet Commonly known as:  ALDACTONE take 1 tablet by mouth once daily   TYLENOL 8 HOUR ARTHRITIS PAIN 650 MG CR tablet Generic drug:  acetaminophen Take 650 mg by mouth  every 8 (eight) hours as needed for pain.      Follow-up Information    Temple PaciniPOOL,Meira Wahba A, MD .   Specialty:  Neurosurgery Contact information: [address] [place] [address] [phone]           Signed: Temple PaciniOOL,Iris Tatsch A 11/10/2015, 1:14 PM

## 2015-11-22 ENCOUNTER — Emergency Department (HOSPITAL_COMMUNITY)
Admission: EM | Admit: 2015-11-22 | Discharge: 2015-11-23 | Disposition: A | Discharge status: Home / Self Care | Payer: Medicare Other | Attending: Emergency Medicine | Admitting: Emergency Medicine

## 2015-11-22 ENCOUNTER — Encounter (HOSPITAL_COMMUNITY): Payer: Self-pay

## 2015-11-22 DIAGNOSIS — E119 Type 2 diabetes mellitus without complications: Secondary | ICD-10-CM | POA: Insufficient documentation

## 2015-11-22 DIAGNOSIS — Z7984 Long term (current) use of oral hypoglycemic drugs: Secondary | ICD-10-CM | POA: Diagnosis not present

## 2015-11-22 DIAGNOSIS — J45909 Unspecified asthma, uncomplicated: Secondary | ICD-10-CM | POA: Diagnosis not present

## 2015-11-22 DIAGNOSIS — Z7982 Long term (current) use of aspirin: Secondary | ICD-10-CM | POA: Diagnosis not present

## 2015-11-22 DIAGNOSIS — M549 Dorsalgia, unspecified: Secondary | ICD-10-CM

## 2015-11-22 DIAGNOSIS — Z794 Long term (current) use of insulin: Secondary | ICD-10-CM | POA: Diagnosis not present

## 2015-11-22 DIAGNOSIS — M545 Low back pain, unspecified: Secondary | ICD-10-CM

## 2015-11-22 DIAGNOSIS — I10 Essential (primary) hypertension: Secondary | ICD-10-CM | POA: Insufficient documentation

## 2015-11-22 HISTORY — DX: Unspecified asthma, uncomplicated: J45.909

## 2015-11-22 MED ORDER — ONDANSETRON HCL 4 MG/2ML IJ SOLN
4.0000 mg | Freq: Once | INTRAMUSCULAR | Status: AC
  Administered 2015-11-23: 4 mg via INTRAVENOUS
  Filled 2015-11-22: qty 2

## 2015-11-22 MED ORDER — MORPHINE SULFATE (PF) 4 MG/ML IV SOLN
4.0000 mg | Freq: Once | INTRAVENOUS | Status: AC
  Administered 2015-11-23: 4 mg via INTRAVENOUS
  Filled 2015-11-22: qty 1

## 2015-11-22 NOTE — ED Triage Notes (Signed)
Pt from home with sudden onset of immobility due to back pain. Pt had back surgery here on 11/10/15. Pt is wearing a back brace. Pt has not been able to move her legs at all x 2 days. VS CBG 447( pt is insulin dependent), 132/68, HR 65, RR 16, 98% RA. Pt alert and oriented x 4.

## 2015-11-22 NOTE — ED Provider Notes (Signed)
[facility]-EMERGENCY DEPT Provider Note   CSN: [id number] Arrival date & time: 11/22/15  2242  By signing my name below, I, Emmanuella Mensah, attest that this documentation has been prepared under the direction and in the presence of Shon Baton, MD. Electronically Signed: Angelene Giovanni, ED Scribe. 11/22/15. 11:50 PM.   History   Chief Complaint Chief Complaint  Patient presents with  . Back Pain    HPI Comments: Heidi Campbell is a 70 y.o. female with a hx of DM and hypertension who presents to the Emergency Department with a back brace in place complaining of gradually worsening 8/10 lower back pain that radiates down bilateral posterior legs onset 2 days ago. She explains that she had lower back surgery on 11/10/15 and had been recovering well until 2 days ago when she had sudden onset of severe pain where she was not able to move her  right leg secondary to her pain. She states that although she is currently taking the Valium and Hydrocodone as prescribed, those medications are not providing any relief. Pt has been able to ambulate and been mobile even since onset of the pain. She has an allergy to Ace inhibirots and shellfish. She denies any fever, bowel/bladder incontinence, urinary symptoms, nausea, vomiting, or any other symptoms.   The history is provided by the patient. No language interpreter was used.    Past Medical History:  Diagnosis Date  . ALLERGIC RHINITIS   . Arthritis   . ASTHMA   . Asthma   . Diabetes mellitus without complication (HCC)   . Esophageal reflux   . Hypertension   . SINUSITIS, ACUTE     Patient Active Problem List   Diagnosis Date Noted  . Degenerative spondylolisthesis 11/07/2015  . Carotid artery disease (HCC) 06/20/2014  . Bradycardia 10/12/2013  . Fatigue 10/12/2013  . Hyperlipidemia 05/14/2013  . Type II or unspecified type diabetes mellitus without mention of complication, uncontrolled 02/19/2013  . Coronary atherosclerosis of  native coronary artery 12/22/2012  . Obesity 12/22/2012  . Hypertension   . SINUSITIS, ACUTE 04/01/2008  . Type II or unspecified type diabetes mellitus without mention of complication, not stated as uncontrolled 02/23/2007  . ALLERGIC RHINITIS 02/23/2007  . ASTHMA 02/23/2007  . Esophageal reflux 02/23/2007    Past Surgical History:  Procedure Laterality Date  . ABDOMINAL HYSTERECTOMY    . arthroscopic knee Right 2015  . BACK SURGERY    . BREAST REDUCTION SURGERY Bilateral 2006  . BREAST SURGERY    . HAND SURGERY Left    damaged nerve in hand    OB History    No data available       Home Medications    Prior to Admission medications   Medication Sig Start Date End Date Taking? Authorizing Provider  acetaminophen (TYLENOL 8 HOUR ARTHRITIS PAIN) 650 MG CR tablet Take 650 mg by mouth every 8 (eight) hours as needed for pain.   Yes Historical Provider, MD  albuterol (PROVENTIL HFA;VENTOLIN HFA) 108 (90 BASE) MCG/ACT inhaler Inhale 2 puffs into the lungs every 6 (six) hours as needed for wheezing or shortness of breath.   Yes Historical Provider, MD  amLODipine (NORVASC) 10 MG tablet Take 1 tablet (10 mg total) by mouth daily. 07/26/14  Yes Donnetta Hutching, MD  aspirin 81 MG tablet Take 81 mg by mouth daily.   Yes Historical Provider, MD  cloNIDine (CATAPRES) 0.2 MG tablet Take 0.2 mg by mouth 2 (two) times daily.    Yes Historical  Provider, MD  diazepam (VALIUM) 5 MG tablet Take 1-2 tablets (5-10 mg total) by mouth every 6 (six) hours as needed for muscle spasms. 11/10/15  Yes Julio Sicks, MD  doxazosin (CARDURA) 4 MG tablet take 1 TABLET AT BEDTIME Patient taking differently: Take 4 mg by mouth at bedtime.  04/15/15  Yes Reather Littler, MD  ezetimibe (ZETIA) 10 MG tablet Take 1 tablet (10 mg total) by mouth daily. 07/20/13  Yes Reather Littler, MD  fluticasone (FLONASE) 50 MCG/ACT nasal spray Place 1 spray into both nostrils daily as needed for allergies.    Yes Historical Provider, MD    Fluticasone-Salmeterol (ADVAIR) 100-50 MCG/DOSE AEPB Inhale 1 puff into the lungs as needed (ASTHMA).    Yes Historical Provider, MD  hydrALAZINE (APRESOLINE) 50 MG tablet Take 1 tablet by mouth 3 times a day. Patient taking differently: Take 50 mg by mouth 3 (three) times daily.  10/17/14  Yes Corky Crafts, MD  insulin aspart (NOVOLOG FLEXPEN) 100 UNIT/ML FlexPen Inject 15 units twice a day Patient taking differently: Inject 10 Units into the skin 3 (three) times daily with meals.  04/15/15  Yes Reather Littler, MD  Insulin Glargine (LANTUS) 100 UNIT/ML Solostar Pen Inject 34 Units into the skin daily at 10 pm. 04/15/15  Yes Reather Littler, MD  irbesartan-hydrochlorothiazide (AVALIDE) 300-12.5 MG per tablet Take 0.5 tablets by mouth daily.   Yes Historical Provider, MD  Liraglutide (VICTOZA) 18 MG/3ML SOPN inject 1.2 milligram subcutaneously daily Patient taking differently: Inject 1.2 mg into the skin daily.  04/15/15  Yes Reather Littler, MD  metFORMIN (GLUCOPHAGE) 1000 MG tablet Take 1 tablet (1,000 mg total) by mouth 2 (two) times daily with a meal. 04/15/15  Yes Reather Littler, MD  methocarbamol (ROBAXIN) 500 MG tablet Take 1 tablet (500 mg total) by mouth 2 (two) times daily. Patient taking differently: Take 500 mg by mouth daily.  01/27/15  Yes Shawn C Joy, PA-C  omeprazole (PRILOSEC) 20 MG capsule Take 20 mg by mouth daily.   Yes Historical Provider, MD  ondansetron (ZOFRAN ODT) 4 MG disintegrating tablet Take 1 tablet (4 mg total) by mouth every 8 (eight) hours as needed for nausea or vomiting. 01/27/15  Yes Shawn C Joy, PA-C  oxyCODONE-acetaminophen (PERCOCET/ROXICET) 5-325 MG tablet Take 1-2 tablets by mouth every 4 (four) hours as needed for severe pain. 11/10/15  Yes Julio Sicks, MD  polyethylene glycol [facility] / GLYCOLAX) packet Take 17 g by mouth daily as needed for moderate constipation.    Yes Historical Provider, MD  rosuvastatin (CRESTOR) 40 MG tablet Take 1 tablet (40 mg total) by mouth daily.  12/22/12  Yes Corky Crafts, MD  spironolactone (ALDACTONE) 50 MG tablet take 1 tablet by mouth once daily 11/03/15  Yes Corky Crafts, MD  glucose blood (ONETOUCH VERIO) test strip TEST BLOOD SUGAR twice a day Dx code E11.9 04/15/15   Reather Littler, MD  Insulin Pen Needle (BD PEN NEEDLE NANO U/F) 32G X 4 MM MISC Use 4 per day 04/15/15   Reather Littler, MD  [facility] DELICA LANCETS 33G MISC Use to check blood sugar twice a day dx code E11.9 04/15/15   Reather Littler, MD    Family History Family History  Problem Relation Age of Onset  . Diabetes Mother   . Heart attack Father   . Heart disease Father     Social History Social History  Substance Use Topics  . Smoking status: Never Smoker  . Smokeless tobacco: Never Used  .  Alcohol use No     Allergies   Ace inhibitors and Shellfish allergy   Review of Systems Review of Systems  Constitutional: Negative for chills and fever.  Gastrointestinal: Negative for nausea and vomiting.  Genitourinary: Negative for dysuria and hematuria.  Musculoskeletal: Positive for back pain.  All other systems reviewed and are negative.    Physical Exam Updated Vital Signs BP 167/67   Pulse (!) 55   Temp 97.9 F (36.6 C) (Oral)   Resp 16   Ht 5\' 3"  (1.6 m)   Wt 235 lb (106.6 kg)   SpO2 99%   BMI 41.63 kg/m   Physical Exam  Constitutional: She is oriented to person, place, and time. She appears well-developed and well-nourished.  Overweight  HENT:  Head: Normocephalic and atraumatic.  Cardiovascular: Normal rate, regular rhythm and normal heart sounds.   Pulmonary/Chest: Effort normal and breath sounds normal. No respiratory distress. She has no wheezes.  Abdominal: Soft. Bowel sounds are normal. There is no tenderness. There is no guarding.  Neurological: She is alert and oriented to person, place, and time.  5 out of 5 strength bilateral lower extremities, normal reflexes, no clonus  Skin: Skin is warm and dry.  Surgical incision  clean dry and intact, scabbing noted, tenderness to palpation adjacent to the surgical incision, no overlying skin changes or fluctuance noted  Psychiatric: She has a normal mood and affect.  Nursing note and vitals reviewed.    ED Treatments / Results  DIAGNOSTIC STUDIES: Oxygen Saturation is 100% on RA, normal by my interpretation.    COORDINATION OF CARE: 11:25 PM- Pt advised of plan for treatment and pt agrees. Pt will receive lab work for further evaluation.    Labs (all labs ordered are listed, but only abnormal results are displayed) Labs Reviewed  CBC WITH DIFFERENTIAL/PLATELET - Abnormal; Notable for the following:       Result Value   Hemoglobin 10.9 (*)    HCT 34.0 (*)    All other components within normal limits  BASIC METABOLIC PANEL - Abnormal; Notable for the following:    CO2 21 (*)    Glucose, Bld 365 (*)    GFR calc non Af Amer 57 (*)    All other components within normal limits    EKG  EKG Interpretation None       Radiology Mr Lumbar Spine W Wo Contrast  Result Date: 11/23/2015 CLINICAL DATA:  Gradually worsening 8/10 low back pain radiating to bilateral lower extremities for 2 days. Status post L3-4 PLIF November 07, 2015. EXAM: MRI LUMBAR SPINE WITHOUT AND WITH CONTRAST TECHNIQUE: Multiplanar and multiecho pulse sequences of the lumbar spine were obtained without and with intravenous contrast. CONTRAST:  20mL MULTIHANCE GADOBENATE DIMEGLUMINE 529 MG/ML IV SOLN COMPARISON:  MRI lumbar spine October 11, 2015 FINDINGS: SEGMENTATION: For the purposes of this report, the last well-formed intervertebral disc will be described as L5-S1. ALIGNMENT: Maintenance of the lumbar lordosis. No malalignment. VERTEBRAE:Status post L3-4 PLIF, pedicle screws result local susceptibility artifact. Restored L3-4 disc height with disc prosthesis. Stable moderate L5-S1 disc height loss and decreased T2 signal within the lumbar disc compatible with mild desiccation. Moderate  subacute on chronic discogenic endplate change L5-S1. No suspicious osseous or intradiscal enhancement. CONUS MEDULLARIS: Conus medullaris terminates at L1-2 and demonstrates normal morphology and signal characteristics. Cauda equina is normal. No abnormal cord, leptomeningeal or epidural enhancement. PARASPINAL AND SOFT TISSUES: 2.9 x 3.1 x 9.2 cm (transverse by AP by CC) paraspinal  fluid collection extending to the L3-4 surgical bed with smooth and thin peripheral enhancement. Scattered fluid fluid levels within the collection. Moderate symmetric paraspinal muscle denervation below the the level of surgical intervention. DISC LEVELS: T12-L1: Chronic LEFT central broad-based disc extrusion measuring up to 5 mm in AP dimension with contiguous 18 mm of superior and inferior migration, minimally enhancing granulation tissue. Mild ventral thecal sac effacement without canal stenosis. No neural foraminal narrowing. L1-2: Tiny central disc protrusion no canal stenosis or neural foraminal narrowing. L2-3: No disc bulge. Mild facet arthropathy without canal stenosis or neural foraminal narrowing. L3-4: PLIF, posterior decompression. Fluid collection extend to the laminectomy surgical beds without thecal sac deformity. No canal stenosis. Neural foramina predominately obscured. L4-5: Small broad-based disc bulge. Moderate RIGHT and severe LEFT facet arthropathy and ligamentum flavum redundancy. Nonenhancing trace RIGHT facet effusion is likely reactive. No canal stenosis. Moderate stable neural foraminal narrowing. L5-S1: Small broad-based disc bulge. Severe RIGHT and moderate LEFT facet arthropathy. RIGHT laminectomy suspected. No canal stenosis. Moderate RIGHT and mild LEFT neural foraminal narrowing. IMPRESSION: Status post recent L3-4 PLIF. 2.9 x 3.1 by IX 0.2 cm paraspinal hematoma/seroma extending to surgical bed without convincing evidence of infection. Old suspected RIGHT L5-S1 laminectomy. Old T12-L1 moderate disc  extrusion. No canal stenosis. Similar moderate L4-5 and L5-S1 neural foraminal narrowing. Electronically Signed   By: Awilda Metro M.D.   On: 11/23/2015 03:53    Procedures Procedures (including critical care time)  Medications Ordered in ED Medications  morphine 4 MG/ML injection 4 mg (4 mg Intravenous Given 11/23/15 0046)  ondansetron (ZOFRAN) injection 4 mg (4 mg Intravenous Given 11/23/15 0044)  sodium chloride 0.9 % bolus 1,000 mL (1,000 mLs Intravenous New Bag/Given 11/23/15 0136)  LORazepam (ATIVAN) injection 1 mg (1 mg Intravenous Given 11/23/15 0207)  gadobenate dimeglumine (MULTIHANCE) injection 20 mL (20 mLs Intravenous Contrast Given 11/23/15 0329)     Initial Impression / Assessment and Plan / ED Course  Shon Baton, MD has reviewed the triage vital signs and the nursing notes.  Pertinent labs & imaging results that were available during my care of the patient were reviewed by me and considered in my medical decision making (see chart for details).  Clinical Course    Patient presents with back pain. Reports worsening pain over the last 2 days. Had previously been well controlled. No neurologic deficits. No fevers or infectious symptoms. She has been ambulatory with physical therapy but just with worsening pain. Incision site is well-appearing without drainage or signs of infection. No leukocytosis. MRI obtained which shows a small seroma first hematoma. Discussed with Dr. Mikal Plane who feels this is normal postoperatively. Patient improved with IV pain medication. She is ambulatory at her baseline. Follow-up with Dr. Jordan Likes.  After history, exam, and medical workup I feel the patient has been appropriately medically screened and is safe for discharge home. Pertinent diagnoses were discussed with the patient. Patient was given return precautions.   Final Clinical Impressions(s) / ED Diagnoses   Final diagnoses:  Back pain  Midline low back pain without sciatica,  unspecified chronicity    New Prescriptions New Prescriptions   No medications on file   I personally performed the services described in this documentation, which was scribed in my presence. The recorded information has been reviewed and is accurate.    Shon Baton, MD 11/23/15 [phone]

## 2015-11-23 ENCOUNTER — Emergency Department (HOSPITAL_COMMUNITY): Payer: Medicare Other

## 2015-11-23 LAB — BASIC METABOLIC PANEL
ANION GAP: 10 (ref 5–15)
BUN: 16 mg/dL (ref 6–20)
CALCIUM: 9.4 mg/dL (ref 8.9–10.3)
CHLORIDE: 104 mmol/L (ref 101–111)
CO2: 21 mmol/L — AB (ref 22–32)
CREATININE: 0.98 mg/dL (ref 0.44–1.00)
GFR calc non Af Amer: 57 mL/min — ABNORMAL LOW (ref >60)
Glucose, Bld: 365 mg/dL — ABNORMAL HIGH (ref 65–99)
Potassium: 4.9 mmol/L (ref 3.5–5.1)
SODIUM: 135 mmol/L (ref 135–145)

## 2015-11-23 LAB — CBC WITH DIFFERENTIAL/PLATELET
BASOS ABS: 0.1 10*3/uL (ref 0.0–0.1)
BASOS PCT: 1 %
EOS ABS: 0.2 10*3/uL (ref 0.0–0.7)
Eosinophils Relative: 3 %
HEMATOCRIT: 34 % — AB (ref 36.0–46.0)
HEMOGLOBIN: 10.9 g/dL — AB (ref 12.0–15.0)
Lymphocytes Relative: 31 %
Lymphs Abs: 2.8 10*3/uL (ref 0.7–4.0)
MCH: 28.2 pg (ref 26.0–34.0)
MCHC: 32.1 g/dL (ref 30.0–36.0)
MCV: 87.9 fL (ref 78.0–100.0)
MONOS PCT: 8 %
Monocytes Absolute: 0.7 10*3/uL (ref 0.1–1.0)
NEUTROS ABS: 5.4 10*3/uL (ref 1.7–7.7)
NEUTROS PCT: 57 %
Platelets: 353 10*3/uL (ref 150–400)
RBC: 3.87 MIL/uL (ref 3.87–5.11)
RDW: 12.8 % (ref 11.5–15.5)
WBC: 9.2 10*3/uL (ref 4.0–10.5)

## 2015-11-23 MED ORDER — SODIUM CHLORIDE 0.9 % IV BOLUS (SEPSIS)
1000.0000 mL | Freq: Once | INTRAVENOUS | Status: AC
  Administered 2015-11-23: 1000 mL via INTRAVENOUS

## 2015-11-23 MED ORDER — GADOBENATE DIMEGLUMINE 529 MG/ML IV SOLN
20.0000 mL | Freq: Once | INTRAVENOUS | Status: AC | PRN
  Administered 2015-11-23: 20 mL via INTRAVENOUS

## 2015-11-23 MED ORDER — LORAZEPAM 2 MG/ML IJ SOLN
1.0000 mg | Freq: Once | INTRAMUSCULAR | Status: AC
  Administered 2015-11-23: 1 mg via INTRAVENOUS
  Filled 2015-11-23: qty 1

## 2015-11-23 NOTE — ED Notes (Signed)
Pt returned from MRI °

## 2015-11-23 NOTE — Discharge Instructions (Signed)
You were seen today for worsening back pain. Your MRI is reassuring and shows postoperative changes. Continue physical therapy and pain medication at home. If you develop worsening pain, fevers, difficulty peeing or pooping, lower extremity weakness you need to be reevaluated immediately.

## 2015-11-23 NOTE — ED Notes (Signed)
Blood drawn by IV team and IV placed

## 2015-11-24 ENCOUNTER — Encounter (HOSPITAL_COMMUNITY): Payer: Self-pay | Admitting: Emergency Medicine

## 2015-11-24 ENCOUNTER — Emergency Department (HOSPITAL_COMMUNITY): Payer: Medicare Other

## 2015-11-24 ENCOUNTER — Emergency Department (HOSPITAL_COMMUNITY)
Admission: EM | Admit: 2015-11-24 | Discharge: 2015-11-25 | Disposition: A | Discharge status: Home / Self Care | Payer: Medicare Other | Attending: Emergency Medicine | Admitting: Emergency Medicine

## 2015-11-24 DIAGNOSIS — Y999 Unspecified external cause status: Secondary | ICD-10-CM | POA: Diagnosis not present

## 2015-11-24 DIAGNOSIS — Z7984 Long term (current) use of oral hypoglycemic drugs: Secondary | ICD-10-CM | POA: Diagnosis not present

## 2015-11-24 DIAGNOSIS — I10 Essential (primary) hypertension: Secondary | ICD-10-CM | POA: Diagnosis not present

## 2015-11-24 DIAGNOSIS — W1830XA Fall on same level, unspecified, initial encounter: Secondary | ICD-10-CM | POA: Insufficient documentation

## 2015-11-24 DIAGNOSIS — Y939 Activity, unspecified: Secondary | ICD-10-CM | POA: Insufficient documentation

## 2015-11-24 DIAGNOSIS — I2510 Atherosclerotic heart disease of native coronary artery without angina pectoris: Secondary | ICD-10-CM | POA: Insufficient documentation

## 2015-11-24 DIAGNOSIS — R531 Weakness: Secondary | ICD-10-CM

## 2015-11-24 DIAGNOSIS — J45909 Unspecified asthma, uncomplicated: Secondary | ICD-10-CM | POA: Diagnosis not present

## 2015-11-24 DIAGNOSIS — M62830 Muscle spasm of back: Secondary | ICD-10-CM

## 2015-11-24 DIAGNOSIS — E119 Type 2 diabetes mellitus without complications: Secondary | ICD-10-CM | POA: Insufficient documentation

## 2015-11-24 DIAGNOSIS — Z794 Long term (current) use of insulin: Secondary | ICD-10-CM | POA: Diagnosis not present

## 2015-11-24 DIAGNOSIS — Y92009 Unspecified place in unspecified non-institutional (private) residence as the place of occurrence of the external cause: Secondary | ICD-10-CM | POA: Insufficient documentation

## 2015-11-24 DIAGNOSIS — M545 Low back pain: Secondary | ICD-10-CM | POA: Diagnosis present

## 2015-11-24 DIAGNOSIS — Z7982 Long term (current) use of aspirin: Secondary | ICD-10-CM | POA: Diagnosis not present

## 2015-11-24 LAB — CBC WITH DIFFERENTIAL/PLATELET
Basophils Absolute: 0.1 10*3/uL (ref 0.0–0.1)
Basophils Relative: 1 %
EOS ABS: 0.2 10*3/uL (ref 0.0–0.7)
EOS PCT: 2 %
HCT: 35.1 % — ABNORMAL LOW (ref 36.0–46.0)
Hemoglobin: 11.2 g/dL — ABNORMAL LOW (ref 12.0–15.0)
LYMPHS ABS: 3 10*3/uL (ref 0.7–4.0)
Lymphocytes Relative: 32 %
MCH: 28.1 pg (ref 26.0–34.0)
MCHC: 31.9 g/dL (ref 30.0–36.0)
MCV: 88 fL (ref 78.0–100.0)
MONO ABS: 0.5 10*3/uL (ref 0.1–1.0)
MONOS PCT: 5 %
Neutro Abs: 5.7 10*3/uL (ref 1.7–7.7)
Neutrophils Relative %: 60 %
PLATELETS: 364 10*3/uL (ref 150–400)
RBC: 3.99 MIL/uL (ref 3.87–5.11)
RDW: 12.7 % (ref 11.5–15.5)
WBC: 9.4 10*3/uL (ref 4.0–10.5)

## 2015-11-24 LAB — BASIC METABOLIC PANEL
Anion gap: 9 (ref 5–15)
BUN: 13 mg/dL (ref 6–20)
CALCIUM: 9.9 mg/dL (ref 8.9–10.3)
CHLORIDE: 101 mmol/L (ref 101–111)
CO2: 27 mmol/L (ref 22–32)
CREATININE: 1.06 mg/dL — AB (ref 0.44–1.00)
GFR calc Af Amer: >60 mL/min (ref >60)
GFR calc non Af Amer: 52 mL/min — ABNORMAL LOW (ref >60)
Glucose, Bld: 258 mg/dL — ABNORMAL HIGH (ref 65–99)
Potassium: 3.9 mmol/L (ref 3.5–5.1)
Sodium: 137 mmol/L (ref 135–145)

## 2015-11-24 LAB — I-STAT CG4 LACTIC ACID, ED: LACTIC ACID, VENOUS: 1.1 mmol/L (ref 0.5–1.9)

## 2015-11-24 MED ORDER — ACETAMINOPHEN 325 MG PO TABS: 650.0000 mg | ORAL_TABLET | Freq: Three times a day (TID) | ORAL | Status: DC | PRN

## 2015-11-24 MED ORDER — MOMETASONE FURO-FORMOTEROL FUM 100-5 MCG/ACT IN AERO
2.0000 | INHALATION_SPRAY | Freq: Two times a day (BID) | RESPIRATORY_TRACT | Status: DC
  Administered 2015-11-25: 2 via RESPIRATORY_TRACT
  Filled 2015-11-24: qty 8.8

## 2015-11-24 MED ORDER — LIRAGLUTIDE 18 MG/3ML ~~LOC~~ SOPN: 1.2000 mg | PEN_INJECTOR | Freq: Every day | SUBCUTANEOUS | Status: DC

## 2015-11-24 MED ORDER — POLYETHYLENE GLYCOL 3350 17 G PO PACK: 17.0000 g | PACK | Freq: Every day | ORAL | Status: DC | PRN

## 2015-11-24 MED ORDER — INSULIN GLARGINE 100 UNIT/ML ~~LOC~~ SOLN
34.0000 [IU] | Freq: Every day | SUBCUTANEOUS | Status: DC
  Administered 2015-11-25: 34 [IU] via SUBCUTANEOUS
  Filled 2015-11-24 (×3): qty 0.34

## 2015-11-24 MED ORDER — IRBESARTAN-HYDROCHLOROTHIAZIDE 300-12.5 MG PO TABS: 0.5000 | ORAL_TABLET | Freq: Every day | ORAL | Status: DC

## 2015-11-24 MED ORDER — IRBESARTAN 150 MG PO TABS
150.0000 mg | ORAL_TABLET | Freq: Every day | ORAL | Status: DC
  Administered 2015-11-25: 150 mg via ORAL
  Filled 2015-11-24 (×2): qty 1

## 2015-11-24 MED ORDER — CLONIDINE HCL 0.2 MG PO TABS
0.2000 mg | ORAL_TABLET | Freq: Two times a day (BID) | ORAL | Status: DC
  Administered 2015-11-25 (×2): 0.2 mg via ORAL
  Filled 2015-11-24 (×2): qty 1

## 2015-11-24 MED ORDER — DIAZEPAM 5 MG PO TABS: 5.0000 mg | ORAL_TABLET | Freq: Four times a day (QID) | ORAL | Status: DC | PRN

## 2015-11-24 MED ORDER — INSULIN ASPART 100 UNIT/ML FLEXPEN: 10.0000 [IU] | PEN_INJECTOR | Freq: Three times a day (TID) | SUBCUTANEOUS | Status: DC

## 2015-11-24 MED ORDER — INSULIN GLARGINE 100 UNIT/ML SOLOSTAR PEN: 34.0000 [IU] | PEN_INJECTOR | Freq: Every day | SUBCUTANEOUS | Status: DC

## 2015-11-24 MED ORDER — EZETIMIBE 10 MG PO TABS
10.0000 mg | ORAL_TABLET | Freq: Every day | ORAL | Status: DC
  Administered 2015-11-25: 10 mg via ORAL
  Filled 2015-11-24: qty 1

## 2015-11-24 MED ORDER — AMLODIPINE BESYLATE 5 MG PO TABS
10.0000 mg | ORAL_TABLET | Freq: Every day | ORAL | Status: DC
  Administered 2015-11-25: 10 mg via ORAL
  Filled 2015-11-24: qty 2

## 2015-11-24 MED ORDER — HYDROMORPHONE HCL 2 MG/ML IJ SOLN
1.0000 mg | Freq: Once | INTRAMUSCULAR | Status: AC
  Administered 2015-11-24: 1 mg via INTRAMUSCULAR
  Filled 2015-11-24: qty 1

## 2015-11-24 MED ORDER — OXYCODONE-ACETAMINOPHEN 5-325 MG PO TABS
1.0000 | ORAL_TABLET | ORAL | Status: DC | PRN
  Administered 2015-11-25: 2 via ORAL
  Filled 2015-11-24: qty 1
  Filled 2015-11-24: qty 2

## 2015-11-24 MED ORDER — INSULIN ASPART 100 UNIT/ML ~~LOC~~ SOLN
10.0000 [IU] | Freq: Three times a day (TID) | SUBCUTANEOUS | Status: DC
  Administered 2015-11-25 (×2): 10 [IU] via SUBCUTANEOUS
  Filled 2015-11-24 (×2): qty 1

## 2015-11-24 MED ORDER — HYDRALAZINE HCL 25 MG PO TABS
50.0000 mg | ORAL_TABLET | Freq: Three times a day (TID) | ORAL | Status: DC
  Administered 2015-11-25 (×2): 50 mg via ORAL
  Filled 2015-11-24 (×2): qty 2

## 2015-11-24 MED ORDER — ROSUVASTATIN CALCIUM 10 MG PO TABS
40.0000 mg | ORAL_TABLET | Freq: Every day | ORAL | Status: DC
  Administered 2015-11-25: 40 mg via ORAL
  Filled 2015-11-24 (×2): qty 4

## 2015-11-24 MED ORDER — FLUTICASONE PROPIONATE 50 MCG/ACT NA SUSP
1.0000 | Freq: Every day | NASAL | Status: DC | PRN
  Filled 2015-11-24: qty 16

## 2015-11-24 MED ORDER — ALBUTEROL SULFATE HFA 108 (90 BASE) MCG/ACT IN AERS: 2.0000 | INHALATION_SPRAY | Freq: Four times a day (QID) | RESPIRATORY_TRACT | Status: DC | PRN

## 2015-11-24 MED ORDER — DOXAZOSIN MESYLATE 4 MG PO TABS
4.0000 mg | ORAL_TABLET | Freq: Every day | ORAL | Status: DC
  Administered 2015-11-25: 4 mg via ORAL
  Filled 2015-11-24 (×3): qty 1

## 2015-11-24 MED ORDER — HYDROCHLOROTHIAZIDE 10 MG/ML ORAL SUSPENSION
6.2500 mg | Freq: Every day | ORAL | Status: DC
  Administered 2015-11-25: 6.25 mg via ORAL
  Filled 2015-11-24 (×2): qty 1.25

## 2015-11-24 MED ORDER — METFORMIN HCL 500 MG PO TABS
1000.0000 mg | ORAL_TABLET | Freq: Two times a day (BID) | ORAL | Status: DC
  Administered 2015-11-25: 1000 mg via ORAL
  Filled 2015-11-24: qty 2

## 2015-11-24 MED ORDER — ASPIRIN 81 MG PO CHEW
81.0000 mg | CHEWABLE_TABLET | Freq: Every day | ORAL | Status: DC
  Administered 2015-11-24 – 2015-11-25 (×2): 81 mg via ORAL
  Filled 2015-11-24 (×2): qty 1

## 2015-11-24 MED ORDER — SPIRONOLACTONE 25 MG PO TABS
50.0000 mg | ORAL_TABLET | Freq: Every day | ORAL | Status: DC
  Administered 2015-11-25: 50 mg via ORAL
  Filled 2015-11-24 (×2): qty 2

## 2015-11-24 NOTE — Progress Notes (Signed)
Spoke with pt, her son and daughter who are all agreeable to ST SNF for rehab in [place]  PT/OT consults are pending.  [facility] has been faxed for bed offers.  PASARR filed. [facility] ED CSW will f/u in am and facilitate d/c to SNF.

## 2015-11-24 NOTE — ED Notes (Signed)
Patient story different from EMS: Pt and family member at bedside report that her surgery was 9 days ago and she fell yesterday. Pt states that her legs "just gave out" causing her to fall.

## 2015-11-24 NOTE — ED Triage Notes (Signed)
Per [facility]: Pt to ED from home c/o lower back pain and "sciatica flare" that has progressed over the past week. Pt had recent L4-5 surgery for spinal stenosis on 10/6, sts she healed well from that, but fell at home 9 days ago. Pt reports worsening pain since the fall, but was seen 2 days ago for same and all scans were negative. Pt denies new numbness/weakness, was ambulatory with assistance on scene. Denies bowel/bladder incontinence. EMS VS: HR 78 NSR, BP 230 palp initially - down to 184/104 PTA, CBG 394.

## 2015-11-24 NOTE — ED Notes (Signed)
Delivered dinner to pt.

## 2015-11-24 NOTE — ED Notes (Signed)
Patient transported to X-ray 

## 2015-11-24 NOTE — ED Notes (Signed)
MD at bedside. 

## 2015-11-24 NOTE — ED Provider Notes (Signed)
[facility]-EM[facility]Y DEPT Provider Note   CSN: [id number] Arrival date & time: 11/24/15  1529     History   Chief Complaint Chief Complaint  Patient presents with  . Back Pain    HPI Heidi Campbell is a 70 y.o. female.  HPI  Patient presents with concern of weakness, fall. Patient is notable history of lumbar spine fusion about 10 days ago. Patient was actually here overnight, 36 hours ago, after a fall that day. She notes that since that evaluation, she has been generally weak at home, had one additional fall that afternoon, resulting in left knee pain, as well as acute exacerbation of pain in her lower back, right leg, which was characteristically the same as prior to the fall. However, the patient has had persistent weakness, inability to ambulate since yesterday's fall, and today, when physical therapy came to do an evaluation, the patient was unable to participate, prompting emergency department transfer.   Past Medical History:  Diagnosis Date  . ALLERGIC RHINITIS   . Arthritis   . ASTHMA   . Asthma   . Diabetes mellitus without complication (HCC)   . Esophageal reflux   . Hypertension   . SINUSITIS, ACUTE     Patient Active Problem List   Diagnosis Date Noted  . Degenerative spondylolisthesis 11/07/2015  . Carotid artery disease (HCC) 06/20/2014  . Bradycardia 10/12/2013  . Fatigue 10/12/2013  . Hyperlipidemia 05/14/2013  . Type II or unspecified type diabetes mellitus without mention of complication, uncontrolled 02/19/2013  . Coronary atherosclerosis of native coronary artery 12/22/2012  . Obesity 12/22/2012  . Hypertension   . SINUSITIS, ACUTE 04/01/2008  . Type II or unspecified type diabetes mellitus without mention of complication, not stated as uncontrolled 02/23/2007  . ALLERGIC RHINITIS 02/23/2007  . ASTHMA 02/23/2007  . Esophageal reflux 02/23/2007    Past Surgical History:  Procedure Laterality Date  . ABDOMINAL HYSTERECTOMY    . arthroscopic  knee Right 2015  . BACK SURGERY    . BREAST REDUCTION SURGERY Bilateral 2006  . BREAST SURGERY    . HAND SURGERY Left    damaged nerve in hand    OB History    No data available       Home Medications    Prior to Admission medications   Medication Sig Start Date End Date Taking? Authorizing Provider  acetaminophen (TYLENOL 8 HOUR ARTHRITIS PAIN) 650 MG CR tablet Take 650 mg by mouth every 8 (eight) hours as needed for pain.    Historical Provider, MD  albuterol (PROVENTIL HFA;VENTOLIN HFA) 108 (90 BASE) MCG/ACT inhaler Inhale 2 puffs into the lungs every 6 (six) hours as needed for wheezing or shortness of breath.    Historical Provider, MD  amLODipine (NORVASC) 10 MG tablet Take 1 tablet (10 mg total) by mouth daily. 07/26/14   Donnetta Hutching, MD  aspirin 81 MG tablet Take 81 mg by mouth daily.    Historical Provider, MD  cloNIDine (CATAPRES) 0.2 MG tablet Take 0.2 mg by mouth 2 (two) times daily.     Historical Provider, MD  diazepam (VALIUM) 5 MG tablet Take 1-2 tablets (5-10 mg total) by mouth every 6 (six) hours as needed for muscle spasms. 11/10/15   Julio Sicks, MD  doxazosin (CARDURA) 4 MG tablet take 1 TABLET AT BEDTIME Patient taking differently: Take 4 mg by mouth at bedtime.  04/15/15   Reather Littler, MD  ezetimibe (ZETIA) 10 MG tablet Take 1 tablet (10 mg total) by mouth daily.  07/20/13   Reather Littler, MD  fluticasone (FLONASE) 50 MCG/ACT nasal spray Place 1 spray into both nostrils daily as needed for allergies.     Historical Provider, MD  Fluticasone-Salmeterol (ADVAIR) 100-50 MCG/DOSE AEPB Inhale 1 puff into the lungs as needed (ASTHMA).     Historical Provider, MD  glucose blood (ONETOUCH VERIO) test strip TEST BLOOD SUGAR twice a day Dx code E11.9 04/15/15   Reather Littler, MD  hydrALAZINE (APRESOLINE) 50 MG tablet Take 1 tablet by mouth 3 times a day. Patient taking differently: Take 50 mg by mouth 3 (three) times daily.  10/17/14   Corky Crafts, MD  insulin aspart (NOVOLOG  FLEXPEN) 100 UNIT/ML FlexPen Inject 15 units twice a day Patient taking differently: Inject 10 Units into the skin 3 (three) times daily with meals.  04/15/15   Reather Littler, MD  Insulin Glargine (LANTUS) 100 UNIT/ML Solostar Pen Inject 34 Units into the skin daily at 10 pm. 04/15/15   Reather Littler, MD  Insulin Pen Needle (BD PEN NEEDLE NANO U/F) 32G X 4 MM MISC Use 4 per day 04/15/15   Reather Littler, MD  irbesartan-hydrochlorothiazide (AVALIDE) 300-12.5 MG per tablet Take 0.5 tablets by mouth daily.    Historical Provider, MD  Liraglutide (VICTOZA) 18 MG/3ML SOPN inject 1.2 milligram subcutaneously daily Patient taking differently: Inject 1.2 mg into the skin daily.  04/15/15   Reather Littler, MD  metFORMIN (GLUCOPHAGE) 1000 MG tablet Take 1 tablet (1,000 mg total) by mouth 2 (two) times daily with a meal. 04/15/15   Reather Littler, MD  methocarbamol (ROBAXIN) 500 MG tablet Take 1 tablet (500 mg total) by mouth 2 (two) times daily. Patient taking differently: Take 500 mg by mouth daily.  01/27/15   Shawn C Joy, PA-C  omeprazole (PRILOSEC) 20 MG capsule Take 20 mg by mouth daily.    Historical Provider, MD  ondansetron (ZOFRAN ODT) 4 MG disintegrating tablet Take 1 tablet (4 mg total) by mouth every 8 (eight) hours as needed for nausea or vomiting. 01/27/15   Shawn C Joy, PA-C  ONETOUCH DELICA LANCETS 33G MISC Use to check blood sugar twice a day dx code E11.9 04/15/15   Reather Littler, MD  oxyCODONE-acetaminophen (PERCOCET/ROXICET) 5-325 MG tablet Take 1-2 tablets by mouth every 4 (four) hours as needed for severe pain. 11/10/15   Julio Sicks, MD  polyethylene glycol [facility] Ethelene Hal) packet Take 17 g by mouth daily as needed for moderate constipation.     Historical Provider, MD  rosuvastatin (CRESTOR) 40 MG tablet Take 1 tablet (40 mg total) by mouth daily. 12/22/12   Corky Crafts, MD  spironolactone (ALDACTONE) 50 MG tablet take 1 tablet by mouth once daily 11/03/15   Corky Crafts, MD    Family  History Family History  Problem Relation Age of Onset  . Diabetes Mother   . Heart attack Father   . Heart disease Father     Social History Social History  Substance Use Topics  . Smoking status: Never Smoker  . Smokeless tobacco: Never Used  . Alcohol use No     Allergies   Ace inhibitors and Shellfish allergy   Review of Systems Review of Systems  Constitutional:       Per HPI, otherwise negative  HENT:       Per HPI, otherwise negative  Respiratory:       Per HPI, otherwise negative  Cardiovascular:       Per HPI, otherwise negative  Gastrointestinal:  Negative for vomiting.  Endocrine:       Negative aside from HPI  Genitourinary:       Neg aside from HPI   Musculoskeletal:       Per HPI, otherwise negative  Skin: Negative for wound.  Neurological: Positive for weakness. Negative for syncope.     Physical Exam Updated Vital Signs BP 193/89 (BP Location: Left Arm)   Pulse 75   Temp 98.4 F (36.9 C) (Oral)   Resp 17   Ht 5\' 3"  (1.6 m)   Wt 235 lb (106.6 kg)   SpO2 98%   BMI 41.63 kg/m   Physical Exam  Constitutional: She is oriented to person, place, and time.  Overweight elderly female awake, alert, speaking clearly  HENT:  Head: Normocephalic and atraumatic.  Cardiovascular: Normal rate, regular rhythm and normal heart sounds.   Pulmonary/Chest: Effort normal and breath sounds normal. No respiratory distress. She has no wheezes.  Abdominal: Soft. There is no tenderness.  Musculoskeletal:       Left knee: She exhibits normal range of motion, no deformity and no laceration. Tenderness found. Medial joint line tenderness noted.       Left ankle: Normal.  Neurological: She is alert and oriented to person, place, and time.  5 out of 5 strength bilateral lower extremities, normal reflexes, no clonus  Skin: Skin is warm and dry.  Surgical incision clean dry and intact, scabbing noted, tenderness to palpation adjacent to the surgical incision, no  overlying skin changes or fluctuance noted  Psychiatric: She has a normal mood and affect.  Nursing note and vitals reviewed.    ED Treatments / Results  Labs (all labs ordered are listed, but only abnormal results are displayed) Labs Reviewed  BASIC METABOLIC PANEL  CBC WITH DIFFERENTIAL/PLATELET  URINALYSIS, ROUTINE W REFLEX MICROSCOPIC (NOT AT [facility])  I-STAT CG4 LACTIC ACID, ED    EKG  EKG Interpretation None       Radiology Mr Lumbar Spine W Wo Contrast  Result Date: 11/23/2015 CLINICAL DATA:  Gradually worsening 8/10 low back pain radiating to bilateral lower extremities for 2 days. Status post L3-4 PLIF November 07, 2015. EXAM: MRI LUMBAR SPINE WITHOUT AND WITH CONTRAST TECHNIQUE: Multiplanar and multiecho pulse sequences of the lumbar spine were obtained without and with intravenous contrast. CONTRAST:  20mL MULTIHANCE GADOBENATE DIMEGLUMINE 529 MG/ML IV SOLN COMPARISON:  MRI lumbar spine October 11, 2015 FINDINGS: SEGMENTATION: For the purposes of this report, the last well-formed intervertebral disc will be described as L5-S1. ALIGNMENT: Maintenance of the lumbar lordosis. No malalignment. VERTEBRAE:Status post L3-4 PLIF, pedicle screws result local susceptibility artifact. Restored L3-4 disc height with disc prosthesis. Stable moderate L5-S1 disc height loss and decreased T2 signal within the lumbar disc compatible with mild desiccation. Moderate subacute on chronic discogenic endplate change L5-S1. No suspicious osseous or intradiscal enhancement. CONUS MEDULLARIS: Conus medullaris terminates at L1-2 and demonstrates normal morphology and signal characteristics. Cauda equina is normal. No abnormal cord, leptomeningeal or epidural enhancement. PARASPINAL AND SOFT TISSUES: 2.9 x 3.1 x 9.2 cm (transverse by AP by CC) paraspinal fluid collection extending to the L3-4 surgical bed with smooth and thin peripheral enhancement. Scattered fluid fluid levels within the collection. Moderate  symmetric paraspinal muscle denervation below the the level of surgical intervention. DISC LEVELS: T12-L1: Chronic LEFT central broad-based disc extrusion measuring up to 5 mm in AP dimension with contiguous 18 mm of superior and inferior migration, minimally enhancing granulation tissue. Mild ventral thecal sac effacement  without canal stenosis. No neural foraminal narrowing. L1-2: Tiny central disc protrusion no canal stenosis or neural foraminal narrowing. L2-3: No disc bulge. Mild facet arthropathy without canal stenosis or neural foraminal narrowing. L3-4: PLIF, posterior decompression. Fluid collection extend to the laminectomy surgical beds without thecal sac deformity. No canal stenosis. Neural foramina predominately obscured. L4-5: Small broad-based disc bulge. Moderate RIGHT and severe LEFT facet arthropathy and ligamentum flavum redundancy. Nonenhancing trace RIGHT facet effusion is likely reactive. No canal stenosis. Moderate stable neural foraminal narrowing. L5-S1: Small broad-based disc bulge. Severe RIGHT and moderate LEFT facet arthropathy. RIGHT laminectomy suspected. No canal stenosis. Moderate RIGHT and mild LEFT neural foraminal narrowing. IMPRESSION: Status post recent L3-4 PLIF. 2.9 x 3.1 by IX 0.2 cm paraspinal hematoma/seroma extending to surgical bed without convincing evidence of infection. Old suspected RIGHT L5-S1 laminectomy. Old T12-L1 moderate disc extrusion. No canal stenosis. Similar moderate L4-5 and L5-S1 neural foraminal narrowing. Electronically Signed   By: Awilda Metroourtnay  Bloomer M.D.   On: 11/23/2015 03:53    Procedures Procedures (including critical care time)  Medications Ordered in ED Medications  HYDROmorphone (DILAUDID) injection 1 mg (not administered)   Chart review notable for MRI performed yesterday, demonstrating postsurgical changes.   Initial Impression / Assessment and Plan / ED Course  I have reviewed the triage vital signs and the nursing  notes.  Pertinent labs & imaging results that were available during my care of the patient were reviewed by me and considered in my medical decision making (see chart for details).  Clinical Course    6:59 PM On repeat exam the patient is in no distress. She has some reduction in pain secondary to medication provided here. I reviewed all findings the patient and to family members. Given her persistent weakness, generalized discomfort, and inability to ambulate, the patient had evaluation by social work to facilitate placement to a rehabilitation facility.   Final Clinical Impressions(s) / ED Diagnoses  Elderly female now 10 days status post lumbar spine procedure presents with weakness, difficulty with regulation secondary to pain. Patient has multiple other medical issues. Evaluation today, and yesterday was reassuring, with low suspicion for bacteremia, sepsis, or infected surgical site. Patient does have postsurgical inflammatory changes. Given the patient's persistent weakness, inability to perform ADL independently, we discussed her case with social work to facilitate placement in a rehabilitation facility.    Gerhard Munchobert Adalie Mand, MD 11/24/15 1900

## 2015-11-24 NOTE — ED Notes (Signed)
Pt urged to provide urine. 

## 2015-11-24 NOTE — NC FL2 (Signed)
[place] MEDICAID FL2 LEVEL OF CARE SCREENING TOOL     IDENTIFICATION  Patient Name: Heidi Campbell Birthdate: 1945/06/16 Sex: female Admission Date (Current Location): 11/24/2015  [facility] [place]   Facility and Address:  [facility] [place], [address], [place], [place] [address]      Provider Number: [id number]  Attending Physician Name and Address:  Gerhard Munchobert Lockwood, MD  Relative Name and Phone Number:       Current Level of Care: Hospital Recommended Level of Care: Skilled Nursing Facility Prior Approval Number:    Date Approved/Denied:   PASRR Number:    Discharge Plan: SNF    Current Diagnoses: Patient Active Problem List   Diagnosis Date Noted  . Degenerative spondylolisthesis 11/07/2015  . Carotid artery disease (HCC) 06/20/2014  . Bradycardia 10/12/2013  . Fatigue 10/12/2013  . Hyperlipidemia 05/14/2013  . Type II or unspecified type diabetes mellitus without mention of complication, uncontrolled 02/19/2013  . Coronary atherosclerosis of native coronary artery 12/22/2012  . Obesity 12/22/2012  . Hypertension   . SINUSITIS, ACUTE 04/01/2008  . Type II or unspecified type diabetes mellitus without mention of complication, not stated as uncontrolled 02/23/2007  . ALLERGIC RHINITIS 02/23/2007  . ASTHMA 02/23/2007  . Esophageal reflux 02/23/2007    Orientation RESPIRATION BLADDER Height & Weight     Self, Time, Situation, Place  Normal Continent Weight: 235 lb (106.6 kg) Height:  5\' 3"  (160 cm)  BEHAVIORAL SYMPTOMS/MOOD NEUROLOGICAL BOWEL NUTRITION STATUS      Continent  (heart healthy, carb modified)  AMBULATORY STATUS COMMUNICATION OF NEEDS Skin   Extensive Assist Verbally Normal                       Personal Care Assistance Level of Assistance  Bathing, Feeding, Dressing Bathing Assistance: Limited assistance Feeding assistance: Independent Dressing Assistance: Limited assistance     Functional  Limitations Info  Sight, Hearing, Speech Sight Info: Adequate Hearing Info: Adequate Speech Info: Adequate    SPECIAL CARE FACTORS FREQUENCY  PT (By licensed PT), OT (By licensed OT)     PT Frequency:  (5x/week) OT Frequency:  (5x/week)            Contractures Contractures Info: Not present    Additional Factors Info  Allergies, Code Status Code Status Info:  (full code) Allergies Info:  (ace inhibitors, shellfish)           Current Medications (11/24/2015):  This is the current hospital active medication list Current Facility-Administered Medications  Medication Dose Route Frequency Provider Last Rate Last Dose  . acetaminophen (TYLENOL) CR tablet 650 mg  650 mg Oral Q8H PRN Gerhard Munchobert Lockwood, MD      . albuterol (PROVENTIL HFA;VENTOLIN HFA) 108 (90 Base) MCG/ACT inhaler 2 puff  2 puff Inhalation Q6H PRN Gerhard Munchobert Lockwood, MD      . amLODipine (NORVASC) tablet 10 mg  10 mg Oral Daily Gerhard Munchobert Lockwood, MD      . aspirin tablet 81 mg  81 mg Oral Daily Gerhard Munchobert Lockwood, MD      . cloNIDine (CATAPRES) tablet 0.2 mg  0.2 mg Oral BID Gerhard Munchobert Lockwood, MD      . diazepam (VALIUM) tablet 5-10 mg  5-10 mg Oral Q6H PRN Gerhard Munchobert Lockwood, MD      . doxazosin (CARDURA) tablet 4 mg  4 mg Oral QHS Gerhard Munchobert Lockwood, MD      . ezetimibe (ZETIA) tablet 10 mg  10  mg Oral Daily Gerhard Munch, MD      . fluticasone [facility]) 50 MCG/ACT nasal spray 1 spray  1 spray Each Nare Daily PRN Gerhard Munch, MD      . hydrALAZINE (APRESOLINE) tablet 50 mg  50 mg Oral TID Gerhard Munch, MD      . Melene Muller ON 11/25/2015] insulin aspart (NOVOLOG) FlexPen 10 Units  10 Units Subcutaneous TID WC Gerhard Munch, MD      . Insulin Glargine (LANTUS) Solostar Pen 34 Units  34 Units Subcutaneous Q2200 Gerhard Munch, MD      . irbesartan-hydrochlorothiazide (AVALIDE) 300-12.5 MG per tablet 0.5 tablet  0.5 tablet Oral Daily Gerhard Munch, MD      . liraglutide SOPN 1.2 mg  1.2 mg Subcutaneous Daily Gerhard Munch,  MD      . Melene Muller ON 11/25/2015] metFORMIN (GLUCOPHAGE) tablet 1,000 mg  1,000 mg Oral BID WC Gerhard Munch, MD      . mometasone-formoterol [facility]) 100-5 MCG/ACT inhaler 2 puff  2 puff Inhalation BID Gerhard Munch, MD      . oxyCODONE-acetaminophen (PERCOCET/ROXICET) 5-325 MG per tablet 1-2 tablet  1-2 tablet Oral Q4H PRN Gerhard Munch, MD      . polyethylene glycol (MIRALAX / GLYCOLAX) packet 17 g  17 g Oral Daily PRN Gerhard Munch, MD      . rosuvastatin (CRESTOR) tablet 40 mg  40 mg Oral Daily Gerhard Munch, MD      . spironolactone (ALDACTONE) tablet 50 mg  50 mg Oral Daily Gerhard Munch, MD       Current Outpatient Prescriptions  Medication Sig Dispense Refill  . acetaminophen (TYLENOL 8 HOUR ARTHRITIS PAIN) 650 MG CR tablet Take 650 mg by mouth every 8 (eight) hours as needed for pain.    Marland Kitchen albuterol (PROVENTIL HFA;VENTOLIN HFA) 108 (90 BASE) MCG/ACT inhaler Inhale 2 puffs into the lungs every 6 (six) hours as needed for wheezing or shortness of breath.    Marland Kitchen amLODipine (NORVASC) 10 MG tablet Take 1 tablet (10 mg total) by mouth daily. 30 tablet 1  . aspirin 81 MG tablet Take 81 mg by mouth daily.    . cloNIDine (CATAPRES) 0.2 MG tablet Take 0.2 mg by mouth 2 (two) times daily.     . diazepam (VALIUM) 5 MG tablet Take 1-2 tablets (5-10 mg total) by mouth every 6 (six) hours as needed for muscle spasms. 60 tablet 0  . doxazosin (CARDURA) 4 MG tablet take 1 TABLET AT BEDTIME (Patient taking differently: Take 4 mg by mouth at bedtime. ) 30 tablet 3  . ezetimibe (ZETIA) 10 MG tablet Take 1 tablet (10 mg total) by mouth daily. 30 tablet 3  . fluticasone (FLONASE) 50 MCG/ACT nasal spray Place 1 spray into both nostrils daily as needed for allergies.     . Fluticasone-Salmeterol (ADVAIR) 100-50 MCG/DOSE AEPB Inhale 1 puff into the lungs as needed (ASTHMA).     Marland Kitchen glucose blood (ONETOUCH VERIO) test strip TEST BLOOD SUGAR twice a day Dx code E11.9 100 each 3  . hydrALAZINE (APRESOLINE)  50 MG tablet Take 1 tablet by mouth 3 times a day. (Patient taking differently: Take 50 mg by mouth 3 (three) times daily. ) 90 tablet 11  . insulin aspart (NOVOLOG FLEXPEN) 100 UNIT/ML FlexPen Inject 15 units twice a day (Patient taking differently: Inject 10 Units into the skin 3 (three) times daily with meals. ) 15 mL 3  . Insulin Glargine (LANTUS) 100 UNIT/ML Solostar Pen Inject 34 Units into  the skin daily at 10 pm. 15 mL 2  . Insulin Pen Needle (BD PEN NEEDLE NANO U/F) 32G X 4 MM MISC Use 4 per day 130 each 3  . irbesartan-hydrochlorothiazide (AVALIDE) 300-12.5 MG per tablet Take 0.5 tablets by mouth daily.    . Liraglutide (VICTOZA) 18 MG/3ML SOPN inject 1.2 milligram subcutaneously daily (Patient taking differently: Inject 1.2 mg into the skin daily. ) 6 mL 3  . metFORMIN (GLUCOPHAGE) 1000 MG tablet Take 1 tablet (1,000 mg total) by mouth 2 (two) times daily with a meal. 60 tablet 3  . methocarbamol (ROBAXIN) 500 MG tablet Take 1 tablet (500 mg total) by mouth 2 (two) times daily. (Patient taking differently: Take 500 mg by mouth daily. ) 20 tablet 0  . omeprazole (PRILOSEC) 20 MG capsule Take 20 mg by mouth daily.    . ondansetron (ZOFRAN ODT) 4 MG disintegrating tablet Take 1 tablet (4 mg total) by mouth every 8 (eight) hours as needed for nausea or vomiting. 20 tablet 0  . ONETOUCH DELICA LANCETS 33G MISC Use to check blood sugar twice a day dx code E11.9 100 each 3  . oxyCODONE-acetaminophen (PERCOCET/ROXICET) 5-325 MG tablet Take 1-2 tablets by mouth every 4 (four) hours as needed for severe pain. 90 tablet 0  . polyethylene glycol (MIRALAX / GLYCOLAX) packet Take 17 g by mouth daily as needed for moderate constipation.     . rosuvastatin (CRESTOR) 40 MG tablet Take 1 tablet (40 mg total) by mouth daily. 30 tablet 6  . spironolactone (ALDACTONE) 50 MG tablet take 1 tablet by mouth once daily 30 tablet 5     Discharge Medications: Please see discharge summary for a list of discharge  medications.  Relevant Imaging Results:  Relevant Lab Results:   Additional Information    Kaley Jutras M, LCSW

## 2015-11-24 NOTE — ED Notes (Signed)
IV at right hand not funtional. Copain at same.

## 2015-11-24 NOTE — Progress Notes (Signed)
EDCM received phone call from [facility] reporting patient's son would like patient to have short term rehab.  Patient has been falling at home and patient's son is unable to take care of her.  Clydie BraunKaren reports if SW will call admission social worker at [place] they will be able to assist patient in getting [facility].  SW at [place] is Clydie BraunKaren [phone].  EDCM spoke to [facility] who reports she has already spoken to [place].

## 2015-11-24 NOTE — ED Notes (Signed)
Social worker at bedside.

## 2015-11-25 LAB — CBG MONITORING, ED
GLUCOSE-CAPILLARY: 274 mg/dL — AB (ref 65–99)
GLUCOSE-CAPILLARY: 292 mg/dL — AB (ref 65–99)

## 2015-11-25 LAB — URINALYSIS, ROUTINE W REFLEX MICROSCOPIC
Bilirubin Urine: NEGATIVE
Hgb urine dipstick: NEGATIVE
Ketones, ur: NEGATIVE mg/dL
LEUKOCYTES UA: NEGATIVE
NITRITE: NEGATIVE
PH: 6 (ref 5.0–8.0)
Protein, ur: NEGATIVE mg/dL
SPECIFIC GRAVITY, URINE: 1.041 — AB (ref 1.005–1.030)

## 2015-11-25 LAB — URINE MICROSCOPIC-ADD ON

## 2015-11-25 NOTE — ED Notes (Signed)
Pt w/cell phone on counter in room - given to pt as requested. Pt w/pink house coat and black slippers in room.

## 2015-11-25 NOTE — ED Notes (Signed)
Heidi LinkKen, PT, in w/pt. Advised pt does not have back brace. Pt's daughter-in-law called and is aware pt is in need of it. Pt talking w/daughter-in-law on phone.

## 2015-11-25 NOTE — ED Notes (Addendum)
[facility]. Pt able to void on Bedpan when RN was prepping for I&O cath

## 2015-11-25 NOTE — ED Notes (Addendum)
Son to follow [facility] to [facility] - is placing all of pt's belongings in belongings bag - including back brace, glasses, cell phone, charger, slippers, and house coat.

## 2015-11-25 NOTE — ED Notes (Signed)
Pt's son at bedside - brought pt's back brace. In room w/pt.

## 2015-11-25 NOTE — ED Notes (Signed)
Went in to room, pt stated she had to use the restroom. Pt stated that she had been using the bedpan at home for 4 days and requested to use the same here. Offered bedside commode, refused. Pt stated that she had not urinated since the morning. Pt said she could not urinate at this time. Report given to Roe Coombson, RN about the same. Tech attempting to find the bladder scanner.

## 2015-11-25 NOTE — Progress Notes (Signed)
CSW has provided Patient's daughter with bed offers. Patient's daughter to discuss with her siblings and requests CSW give her a call back in an hour at which time they will have decided on a facility. CSW to call Patient's daughter back at 13:30 for SNF decision and to move forward with SNF placement.          Lance MussAshley Gardner,MSW, LCSW [facility] Clinical Social Worker [phone]

## 2015-11-25 NOTE — Progress Notes (Signed)
Pt accepted to [facility].  Family to do paperwork in the am at the facility.  [facility] ambulance arranged per son's request.

## 2015-11-25 NOTE — ED Notes (Addendum)
Patient was given a cup of Cranberry Juice, A regular diet ordered for lunch.

## 2015-11-25 NOTE — ED Notes (Signed)
Pt stated she wanted to void in a bedpan, encouraged pt to use bedside commode and pt denied. Pt was placed on bedpan and unable to void at this time. Will bladder scan.

## 2015-11-25 NOTE — Evaluation (Signed)
Physical Therapy Evaluation Patient Details Name: Heidi Campbell MRN: [id number] DOB: 10-06-1945 Today's Date: 11/25/2015    History of Present Illness  pt is a 70 y/o female with h/o htn, DM and recent lumbar fusion surgery, admitted to ED with LBP, parasthesias on front of thighs and "sciatica flare" that has progressed over the past week.  Imaging 2 days prior to admit negative.  Clinical Impression  Pt admitted with/for pain post recent lumbar fusion surgery .  Pt currently limited functionally due to the problems listed. ( See problems list.)   Pt will benefit from PT to maximize function and safety in order to get ready for next venue listed below.     Follow Up Recommendations SNF    Equipment Recommendations       Recommendations for Other Services       Precautions / Restrictions Precautions Precautions: Back;Fall Precaution Booklet Issued: No Precaution Comments: educated pt on precautions with return verbal understanding Required Braces or Orthoses: Spinal Brace Spinal Brace: Applied in sitting position Restrictions Weight Bearing Restrictions: No      Mobility  Bed Mobility Overal bed mobility: Needs Assistance Bed Mobility: Rolling;Sidelying to Sit;Sit to Sidelying Rolling: Min assist Sidelying to sit: Min assist     Sit to sidelying: Min assist General bed mobility comments: no rail to use, pt fearful of falling out of bed  Transfers Overall transfer level: Needs assistance Equipment used: Rolling walker (2 wheeled) Transfers: Sit to/from Stand Sit to Stand: Min assist         General transfer comment: min cues for hand placement, steady assist  Ambulation/Gait Ambulation/Gait assistance: Min guard Ambulation Distance (Feet): 150 Feet Assistive device: Rolling walker (2 wheeled) Gait Pattern/deviations: Step-through pattern   Gait velocity interpretation: Below normal speed for age/gender General Gait Details: Generally steady gait, but much  more guarded than on original evaluation.  pt reports fear of falling  Stairs            Wheelchair Mobility    Modified Rankin (Stroke Patients Only)       Balance     Sitting balance-Leahy Scale: Good       Standing balance-Leahy Scale: Poor Standing balance comment: reliant on RW and external support                             Pertinent Vitals/Pain Pain Assessment: Faces Faces Pain Scale: Hurts a little bit Pain Location: front of thigh when sitting Pain Descriptors / Indicators: Aching Pain Intervention(s): Monitored during session;Repositioned    Home Living Family/patient expects to be discharged to:: Private residence Living Arrangements: Children Available Help at Discharge: Family;Available 24 hours/day Type of Home: House Home Access: Stairs to enter Entrance Stairs-Rails: Can reach both Entrance Stairs-Number of Steps: 2 Home Layout: One level Home Equipment: Grab bars - tub/shower;Grab bars - toilet;Shower seat - built in;Crutches      Prior Function Level of Independence: Independent         Comments: lives alone, family to assist short term until pt independent     Hand Dominance        Extremity/Trunk Assessment               Lower Extremity Assessment: Overall WFL for tasks assessed (proximal weakness--hip flexors and lower trunk)         Communication   Communication: No difficulties  Cognition Arousal/Alertness: Awake/alert Behavior During Therapy: WFL for tasks assessed/performed Overall  Cognitive Status: Within Functional Limits for tasks assessed                      General Comments General comments (skin integrity, edema, etc.): Reviewed back care/prec.    Exercises     Assessment/Plan    PT Assessment All further PT needs can be met in the next venue of care  PT Problem List Decreased strength;Decreased balance;Decreased mobility;Decreased knowledge of precautions;Pain;Decreased  activity tolerance;Decreased knowledge of use of DME          PT Treatment Interventions      PT Goals (Current goals can be found in the Care Plan section)  Acute Rehab PT Goals Patient Stated Goal: Need to get strong at rehab before home PT Goal Formulation: With patient Time For Goal Achievement: 11/14/15 Potential to Achieve Goals: Good    Frequency     Barriers to discharge        Co-evaluation               End of Session Equipment Utilized During Treatment: Gait belt Activity Tolerance: Patient tolerated treatment well Patient left: in chair;with call bell/phone within reach Nurse Communication: Mobility status    Functional Assessment Tool Used: clinical judgement Functional Limitation: Mobility: Walking and moving around Mobility: Walking and Moving Around Current Status ([id number]): At least 1 percent but less than 20 percent impaired, limited or restricted Mobility: Walking and Moving Around Goal Status [phone]): At least 1 percent but less than 20 percent impaired, limited or restricted Mobility: Walking and Moving Around Discharge Status [phone]): At least 1 percent but less than 20 percent impaired, limited or restricted    Time: 1015-1100 PT Time Calculation (min) (ACUTE ONLY): 45 min   Charges:   PT Evaluation $PT Eval Low Complexity: 1 Procedure PT Treatments $Gait Training: 8-22 mins $Therapeutic Activity: 8-22 mins   PT G Codes:   PT G-Codes **NOT FOR INPATIENT CLASS** Functional Assessment Tool Used: clinical judgement Functional Limitation: Mobility: Walking and moving around Mobility: Walking and Moving Around Current Status ([id number]): At least 1 percent but less than 20 percent impaired, limited or restricted Mobility: Walking and Moving Around Goal Status [phone]): At least 1 percent but less than 20 percent impaired, limited or restricted Mobility: Walking and Moving Around Discharge Status [phone]): At least 1 percent but less than 20 percent  impaired, limited or restricted    Jihan Mellette, Tessie Fass 11/25/2015, 11:32 AM  11/25/2015  Donnella Sham, [place] [phone]  (pager)

## 2015-11-25 NOTE — ED Notes (Signed)
Unable to locate bladder scanner in dept. At this time

## 2015-11-25 NOTE — Progress Notes (Signed)
CSW contacted Patient's daughter who confirmed that they do not want patient to go to [facility]. CSW has resent referrals to SNFs and is awaiting bed offers at this time.          Lance MussAshley Gardner,MSW, LCSW [facility] ED/25M Clinical Social Worker [phone]

## 2015-11-25 NOTE — ED Notes (Signed)
Pt accepted to [facility] [phone]- [phone].

## 2015-12-05 ENCOUNTER — Emergency Department (HOSPITAL_COMMUNITY): Payer: Medicare Other

## 2015-12-05 ENCOUNTER — Encounter (HOSPITAL_COMMUNITY): Payer: Self-pay | Admitting: Emergency Medicine

## 2015-12-05 ENCOUNTER — Inpatient Hospital Stay (HOSPITAL_COMMUNITY)
Admission: EM | Admit: 2015-12-05 | Discharge: 2015-12-12 | DRG: 682 | Disposition: A | Discharge status: Skilled Nursing Facility | Payer: Medicare Other | Attending: Internal Medicine | Admitting: Internal Medicine

## 2015-12-05 DIAGNOSIS — E876 Hypokalemia: Secondary | ICD-10-CM | POA: Diagnosis not present

## 2015-12-05 DIAGNOSIS — R32 Unspecified urinary incontinence: Secondary | ICD-10-CM | POA: Diagnosis present

## 2015-12-05 DIAGNOSIS — Z452 Encounter for adjustment and management of vascular access device: Secondary | ICD-10-CM

## 2015-12-05 DIAGNOSIS — I10 Essential (primary) hypertension: Secondary | ICD-10-CM | POA: Diagnosis not present

## 2015-12-05 DIAGNOSIS — E871 Hypo-osmolality and hyponatremia: Secondary | ICD-10-CM | POA: Diagnosis present

## 2015-12-05 DIAGNOSIS — M1990 Unspecified osteoarthritis, unspecified site: Secondary | ICD-10-CM | POA: Diagnosis present

## 2015-12-05 DIAGNOSIS — M6282 Rhabdomyolysis: Secondary | ICD-10-CM | POA: Diagnosis present

## 2015-12-05 DIAGNOSIS — Z7982 Long term (current) use of aspirin: Secondary | ICD-10-CM

## 2015-12-05 DIAGNOSIS — E785 Hyperlipidemia, unspecified: Secondary | ICD-10-CM | POA: Diagnosis present

## 2015-12-05 DIAGNOSIS — E119 Type 2 diabetes mellitus without complications: Secondary | ICD-10-CM

## 2015-12-05 DIAGNOSIS — Z9889 Other specified postprocedural states: Secondary | ICD-10-CM

## 2015-12-05 DIAGNOSIS — E872 Acidosis: Secondary | ICD-10-CM | POA: Diagnosis present

## 2015-12-05 DIAGNOSIS — N179 Acute kidney failure, unspecified: Secondary | ICD-10-CM | POA: Diagnosis present

## 2015-12-05 DIAGNOSIS — Z91041 Radiographic dye allergy status: Secondary | ICD-10-CM

## 2015-12-05 DIAGNOSIS — E1122 Type 2 diabetes mellitus with diabetic chronic kidney disease: Secondary | ICD-10-CM | POA: Diagnosis present

## 2015-12-05 DIAGNOSIS — Z981 Arthrodesis status: Secondary | ICD-10-CM

## 2015-12-05 DIAGNOSIS — J45909 Unspecified asthma, uncomplicated: Secondary | ICD-10-CM | POA: Diagnosis present

## 2015-12-05 DIAGNOSIS — Y801 Therapeutic (nonsurgical) and rehabilitative physical medicine devices associated with adverse incidents: Secondary | ICD-10-CM | POA: Diagnosis not present

## 2015-12-05 DIAGNOSIS — I2510 Atherosclerotic heart disease of native coronary artery without angina pectoris: Secondary | ICD-10-CM | POA: Diagnosis present

## 2015-12-05 DIAGNOSIS — Z7951 Long term (current) use of inhaled steroids: Secondary | ICD-10-CM

## 2015-12-05 DIAGNOSIS — N390 Urinary tract infection, site not specified: Secondary | ICD-10-CM | POA: Diagnosis not present

## 2015-12-05 DIAGNOSIS — G253 Myoclonus: Secondary | ICD-10-CM | POA: Diagnosis present

## 2015-12-05 DIAGNOSIS — Y92230 Patient room in hospital as the place of occurrence of the external cause: Secondary | ICD-10-CM | POA: Diagnosis not present

## 2015-12-05 DIAGNOSIS — E669 Obesity, unspecified: Secondary | ICD-10-CM | POA: Diagnosis not present

## 2015-12-05 DIAGNOSIS — E869 Volume depletion, unspecified: Secondary | ICD-10-CM | POA: Diagnosis present

## 2015-12-05 DIAGNOSIS — D631 Anemia in chronic kidney disease: Secondary | ICD-10-CM | POA: Diagnosis present

## 2015-12-05 DIAGNOSIS — I959 Hypotension, unspecified: Secondary | ICD-10-CM | POA: Diagnosis present

## 2015-12-05 DIAGNOSIS — Z888 Allergy status to other drugs, medicaments and biological substances status: Secondary | ICD-10-CM

## 2015-12-05 DIAGNOSIS — K219 Gastro-esophageal reflux disease without esophagitis: Secondary | ICD-10-CM | POA: Diagnosis present

## 2015-12-05 DIAGNOSIS — Z6841 Body Mass Index (BMI) 40.0 and over, adult: Secondary | ICD-10-CM

## 2015-12-05 DIAGNOSIS — E784 Other hyperlipidemia: Secondary | ICD-10-CM | POA: Diagnosis not present

## 2015-12-05 DIAGNOSIS — E7849 Other hyperlipidemia: Secondary | ICD-10-CM | POA: Diagnosis present

## 2015-12-05 DIAGNOSIS — Z91013 Allergy to seafood: Secondary | ICD-10-CM

## 2015-12-05 DIAGNOSIS — R34 Anuria and oliguria: Secondary | ICD-10-CM | POA: Diagnosis present

## 2015-12-05 DIAGNOSIS — B952 Enterococcus as the cause of diseases classified elsewhere: Secondary | ICD-10-CM | POA: Diagnosis present

## 2015-12-05 DIAGNOSIS — R400 Somnolence: Secondary | ICD-10-CM

## 2015-12-05 DIAGNOSIS — T827XXA Infection and inflammatory reaction due to other cardiac and vascular devices, implants and grafts, initial encounter: Secondary | ICD-10-CM | POA: Diagnosis not present

## 2015-12-05 DIAGNOSIS — N183 Chronic kidney disease, stage 3 (moderate): Secondary | ICD-10-CM | POA: Diagnosis present

## 2015-12-05 DIAGNOSIS — T814XXA Infection following a procedure, initial encounter: Secondary | ICD-10-CM | POA: Diagnosis present

## 2015-12-05 DIAGNOSIS — M7989 Other specified soft tissue disorders: Secondary | ICD-10-CM | POA: Diagnosis present

## 2015-12-05 DIAGNOSIS — K5900 Constipation, unspecified: Secondary | ICD-10-CM | POA: Diagnosis present

## 2015-12-05 DIAGNOSIS — E8770 Fluid overload, unspecified: Secondary | ICD-10-CM | POA: Diagnosis present

## 2015-12-05 DIAGNOSIS — R001 Bradycardia, unspecified: Secondary | ICD-10-CM | POA: Diagnosis present

## 2015-12-05 DIAGNOSIS — G9341 Metabolic encephalopathy: Secondary | ICD-10-CM | POA: Diagnosis present

## 2015-12-05 DIAGNOSIS — Z79891 Long term (current) use of opiate analgesic: Secondary | ICD-10-CM

## 2015-12-05 DIAGNOSIS — Z79899 Other long term (current) drug therapy: Secondary | ICD-10-CM

## 2015-12-05 DIAGNOSIS — Z833 Family history of diabetes mellitus: Secondary | ICD-10-CM

## 2015-12-05 DIAGNOSIS — N170 Acute kidney failure with tubular necrosis: Secondary | ICD-10-CM | POA: Diagnosis not present

## 2015-12-05 DIAGNOSIS — E875 Hyperkalemia: Secondary | ICD-10-CM | POA: Diagnosis present

## 2015-12-05 DIAGNOSIS — L304 Erythema intertrigo: Secondary | ICD-10-CM | POA: Diagnosis present

## 2015-12-05 DIAGNOSIS — E1151 Type 2 diabetes mellitus with diabetic peripheral angiopathy without gangrene: Secondary | ICD-10-CM | POA: Diagnosis present

## 2015-12-05 DIAGNOSIS — Z91040 Latex allergy status: Secondary | ICD-10-CM

## 2015-12-05 DIAGNOSIS — L8990 Pressure ulcer of unspecified site, unspecified stage: Secondary | ICD-10-CM | POA: Insufficient documentation

## 2015-12-05 DIAGNOSIS — G9340 Encephalopathy, unspecified: Secondary | ICD-10-CM

## 2015-12-05 DIAGNOSIS — Z794 Long term (current) use of insulin: Secondary | ICD-10-CM

## 2015-12-05 DIAGNOSIS — D72829 Elevated white blood cell count, unspecified: Secondary | ICD-10-CM | POA: Diagnosis not present

## 2015-12-05 LAB — COMPREHENSIVE METABOLIC PANEL
ALBUMIN: 3.3 g/dL — AB (ref 3.5–5.0)
ALT: 23 U/L (ref 14–54)
ANION GAP: 12 (ref 5–15)
AST: 35 U/L (ref 15–41)
Alkaline Phosphatase: 52 U/L (ref 38–126)
BUN: 100 mg/dL — ABNORMAL HIGH (ref 6–20)
CHLORIDE: 98 mmol/L — AB (ref 101–111)
CO2: 18 mmol/L — AB (ref 22–32)
Calcium: 8.6 mg/dL — ABNORMAL LOW (ref 8.9–10.3)
Creatinine, Ser: 8.7 mg/dL — ABNORMAL HIGH (ref 0.44–1.00)
GFR calc Af Amer: 5 mL/min — ABNORMAL LOW (ref >60)
GFR calc non Af Amer: 4 mL/min — ABNORMAL LOW (ref >60)
Glucose, Bld: 82 mg/dL (ref 65–99)
POTASSIUM: 8.4 mmol/L — AB (ref 3.5–5.1)
SODIUM: 128 mmol/L — AB (ref 135–145)
TOTAL PROTEIN: 7.1 g/dL (ref 6.5–8.1)
Total Bilirubin: 0.8 mg/dL (ref 0.3–1.2)

## 2015-12-05 LAB — I-STAT CHEM 8, ED
BUN: 109 mg/dL — ABNORMAL HIGH (ref 6–20)
CALCIUM ION: 1 mmol/L — AB (ref 1.15–1.40)
Chloride: 100 mmol/L — ABNORMAL LOW (ref 101–111)
Creatinine, Ser: 10 mg/dL — ABNORMAL HIGH (ref 0.44–1.00)
GLUCOSE: 83 mg/dL (ref 65–99)
HCT: 26 % — ABNORMAL LOW (ref 36.0–46.0)
HEMOGLOBIN: 8.8 g/dL — AB (ref 12.0–15.0)
Potassium: 8.3 mmol/L (ref 3.5–5.1)
SODIUM: 123 mmol/L — AB (ref 135–145)
TCO2: 17 mmol/L (ref 0–100)

## 2015-12-05 LAB — CBC WITH DIFFERENTIAL/PLATELET
BASOS ABS: 0 10*3/uL (ref 0.0–0.1)
Basophils Relative: 0 %
Eosinophils Absolute: 0 10*3/uL (ref 0.0–0.7)
Eosinophils Relative: 0 %
HEMATOCRIT: 28.5 % — AB (ref 36.0–46.0)
Hemoglobin: 9.2 g/dL — ABNORMAL LOW (ref 12.0–15.0)
LYMPHS PCT: 13 %
Lymphs Abs: 1.4 10*3/uL (ref 0.7–4.0)
MCH: 28.4 pg (ref 26.0–34.0)
MCHC: 32.3 g/dL (ref 30.0–36.0)
MCV: 88 fL (ref 78.0–100.0)
MONO ABS: 1.3 10*3/uL — AB (ref 0.1–1.0)
Monocytes Relative: 12 %
NEUTROS ABS: 7.9 10*3/uL — AB (ref 1.7–7.7)
Neutrophils Relative %: 75 %
Platelets: 277 10*3/uL (ref 150–400)
RBC: 3.24 MIL/uL — ABNORMAL LOW (ref 3.87–5.11)
RDW: 13.2 % (ref 11.5–15.5)
WBC: 10.6 10*3/uL — ABNORMAL HIGH (ref 4.0–10.5)

## 2015-12-05 LAB — I-STAT TROPONIN, ED: Troponin i, poc: 0.05 ng/mL (ref 0.00–0.08)

## 2015-12-05 LAB — BLOOD GAS, ARTERIAL
ACID-BASE DEFICIT: 9.3 mmol/L — AB (ref 0.0–2.0)
BICARBONATE: 16.5 mmol/L — AB (ref 20.0–28.0)
Drawn by: 27605
FIO2: 100
O2 Saturation: 99.1 %
PCO2 ART: 37.6 mmHg (ref 32.0–48.0)
PH ART: 7.264 — AB (ref 7.350–7.450)
Patient temperature: 98.6
pO2, Arterial: 336 mmHg — ABNORMAL HIGH (ref 83.0–108.0)

## 2015-12-05 LAB — URINALYSIS, ROUTINE W REFLEX MICROSCOPIC
Bilirubin Urine: NEGATIVE
Glucose, UA: NEGATIVE mg/dL
KETONES UR: NEGATIVE mg/dL
NITRITE: NEGATIVE
PH: 5.5 (ref 5.0–8.0)
PROTEIN: 100 mg/dL — AB
Specific Gravity, Urine: 1.021 (ref 1.005–1.030)

## 2015-12-05 LAB — I-STAT CG4 LACTIC ACID, ED
LACTIC ACID, VENOUS: 1.93 mmol/L — AB (ref 0.5–1.9)
LACTIC ACID, VENOUS: 2.58 mmol/L — AB (ref 0.5–1.9)

## 2015-12-05 LAB — URINE MICROSCOPIC-ADD ON

## 2015-12-05 LAB — POTASSIUM: Potassium: 7.1 mmol/L (ref 3.5–5.1)

## 2015-12-05 LAB — MRSA PCR SCREENING: MRSA BY PCR: NEGATIVE

## 2015-12-05 LAB — CBG MONITORING, ED: Glucose-Capillary: 83 mg/dL (ref 65–99)

## 2015-12-05 LAB — AMMONIA: Ammonia: 15 umol/L (ref 9–35)

## 2015-12-05 MED ORDER — INSULIN ASPART 100 UNIT/ML IV SOLN
10.0000 [IU] | Freq: Once | INTRAVENOUS | Status: AC
  Administered 2015-12-05: 10 [IU] via INTRAVENOUS
  Filled 2015-12-05: qty 0.1

## 2015-12-05 MED ORDER — SODIUM CHLORIDE 0.9 % IV SOLN: 250.0000 mL | INTRAVENOUS | Status: DC | PRN

## 2015-12-05 MED ORDER — SODIUM CHLORIDE 0.9 % IV BOLUS (SEPSIS)
1000.0000 mL | Freq: Once | INTRAVENOUS | Status: AC
  Administered 2015-12-05: 1000 mL via INTRAVENOUS

## 2015-12-05 MED ORDER — SODIUM POLYSTYRENE SULFONATE 15 GM/60ML PO SUSP: 30.0000 g | Freq: Once | ORAL | Status: DC

## 2015-12-05 MED ORDER — CALCIUM GLUCONATE 10 % IV SOLN
1.0000 g | Freq: Once | INTRAVENOUS | Status: DC
  Filled 2015-12-05: qty 10

## 2015-12-05 MED ORDER — DEXTROSE 50 % IV SOLN
1.0000 | Freq: Once | INTRAVENOUS | Status: AC
  Administered 2015-12-05: 50 mL via INTRAVENOUS

## 2015-12-05 MED ORDER — SODIUM BICARBONATE 8.4 % IV SOLN
INTRAVENOUS | Status: DC
  Administered 2015-12-06: 01:00:00 via INTRAVENOUS
  Filled 2015-12-05 (×4): qty 150

## 2015-12-05 MED ORDER — HEPARIN SODIUM (PORCINE) 1000 UNIT/ML DIALYSIS
1000.0000 [IU] | INTRAMUSCULAR | Status: DC | PRN
  Administered 2015-12-11: 1000 [IU] via INTRAVENOUS_CENTRAL
  Filled 2015-12-05: qty 1

## 2015-12-05 MED ORDER — NALOXONE HCL 0.4 MG/ML IJ SOLN
0.4000 mg | Freq: Once | INTRAMUSCULAR | Status: AC
  Administered 2015-12-05: 0.4 mg via INTRAVENOUS

## 2015-12-05 MED ORDER — IPRATROPIUM-ALBUTEROL 0.5-2.5 (3) MG/3ML IN SOLN: 3.0000 mL | RESPIRATORY_TRACT | Status: DC | PRN

## 2015-12-05 MED ORDER — PENTAFLUOROPROP-TETRAFLUOROETH EX AERO: 1.0000 "application " | INHALATION_SPRAY | CUTANEOUS | Status: DC | PRN

## 2015-12-05 MED ORDER — SODIUM CHLORIDE 0.9 % IV SOLN
1.0000 g | Freq: Two times a day (BID) | INTRAVENOUS | Status: DC
  Filled 2015-12-05: qty 1000

## 2015-12-05 MED ORDER — SODIUM CHLORIDE 0.9 % IV SOLN
1.0000 g | Freq: Once | INTRAVENOUS | Status: AC
  Administered 2015-12-05: 1 g via INTRAVENOUS
  Filled 2015-12-05: qty 10

## 2015-12-05 MED ORDER — DEXTROSE 5 % IV SOLN: 1.0000 g | INTRAVENOUS | Status: DC

## 2015-12-05 MED ORDER — ALBUTEROL SULFATE (2.5 MG/3ML) 0.083% IN NEBU
5.0000 mg | INHALATION_SOLUTION | Freq: Once | RESPIRATORY_TRACT | Status: AC
  Administered 2015-12-05: 5 mg via RESPIRATORY_TRACT
  Filled 2015-12-05: qty 6

## 2015-12-05 MED ORDER — VANCOMYCIN HCL IN DEXTROSE 1-5 GM/200ML-% IV SOLN
1000.0000 mg | Freq: Once | INTRAVENOUS | Status: DC
  Filled 2015-12-05: qty 200

## 2015-12-05 MED ORDER — SODIUM CHLORIDE 0.9 % IV SOLN: 100.0000 mL | INTRAVENOUS | Status: DC | PRN

## 2015-12-05 MED ORDER — SODIUM CHLORIDE 0.9 % IV SOLN
2.0000 g | Freq: Once | INTRAVENOUS | Status: AC
  Administered 2015-12-05: 2 g via INTRAVENOUS
  Filled 2015-12-05: qty 2000

## 2015-12-05 MED ORDER — DEXTROSE 5 % IV SOLN
2.0000 g | Freq: Once | INTRAVENOUS | Status: AC
  Administered 2015-12-05: 2 g via INTRAVENOUS
  Filled 2015-12-05: qty 2

## 2015-12-05 MED ORDER — CALCIUM CHLORIDE 10 % IV SOLN
1.0000 g | Freq: Once | INTRAVENOUS | Status: AC
  Administered 2015-12-05: 1 g via INTRAVENOUS

## 2015-12-05 MED ORDER — LIDOCAINE-PRILOCAINE 2.5-2.5 % EX CREA
1.0000 "application " | TOPICAL_CREAM | CUTANEOUS | Status: DC | PRN
  Filled 2015-12-05: qty 5

## 2015-12-05 MED ORDER — ALBUTEROL SULFATE (2.5 MG/3ML) 0.083% IN NEBU
10.0000 mg | INHALATION_SOLUTION | Freq: Once | RESPIRATORY_TRACT | Status: AC
  Administered 2015-12-05: 10 mg via RESPIRATORY_TRACT
  Filled 2015-12-05: qty 12

## 2015-12-05 MED ORDER — LIDOCAINE HCL (PF) 1 % IJ SOLN: 5.0000 mL | INTRAMUSCULAR | Status: DC | PRN

## 2015-12-05 MED ORDER — SODIUM CHLORIDE 0.9 % IV SOLN: INTRAVENOUS | Status: DC

## 2015-12-05 MED ORDER — DOPAMINE-DEXTROSE 3.2-5 MG/ML-% IV SOLN
0.0000 ug/kg/min | Freq: Once | INTRAVENOUS | Status: DC
  Filled 2015-12-05: qty 250

## 2015-12-05 MED ORDER — INSULIN ASPART 100 UNIT/ML ~~LOC~~ SOLN
0.0000 [IU] | SUBCUTANEOUS | Status: DC
  Administered 2015-12-06: 2 [IU] via SUBCUTANEOUS
  Administered 2015-12-06: 1 [IU] via SUBCUTANEOUS
  Administered 2015-12-07: 2 [IU] via SUBCUTANEOUS
  Administered 2015-12-07 – 2015-12-08 (×2): 3 [IU] via SUBCUTANEOUS
  Administered 2015-12-08 (×2): 1 [IU] via SUBCUTANEOUS
  Administered 2015-12-08 – 2015-12-09 (×5): 2 [IU] via SUBCUTANEOUS
  Administered 2015-12-09: 1 [IU] via SUBCUTANEOUS
  Administered 2015-12-09: 2 [IU] via SUBCUTANEOUS
  Administered 2015-12-10 (×2): 1 [IU] via SUBCUTANEOUS
  Administered 2015-12-10: 2 [IU] via SUBCUTANEOUS
  Administered 2015-12-10: 3 [IU] via SUBCUTANEOUS
  Administered 2015-12-11 (×3): 1 [IU] via SUBCUTANEOUS
  Administered 2015-12-11 – 2015-12-12 (×3): 2 [IU] via SUBCUTANEOUS
  Administered 2015-12-12 (×2): 1 [IU] via SUBCUTANEOUS
  Administered 2015-12-12: 2 [IU] via SUBCUTANEOUS
  Administered 2015-12-12: 3 [IU] via SUBCUTANEOUS

## 2015-12-05 MED ORDER — VANCOMYCIN HCL IN DEXTROSE 1-5 GM/200ML-% IV SOLN
1000.0000 mg | Freq: Once | INTRAVENOUS | Status: AC
  Administered 2015-12-05: 1000 mg via INTRAVENOUS
  Filled 2015-12-05: qty 200

## 2015-12-05 MED ORDER — IPRATROPIUM-ALBUTEROL 0.5-2.5 (3) MG/3ML IN SOLN: 3.0000 mL | Freq: Four times a day (QID) | RESPIRATORY_TRACT | Status: DC

## 2015-12-05 MED ORDER — HEPARIN SODIUM (PORCINE) 1000 UNIT/ML DIALYSIS
2000.0000 [IU] | INTRAMUSCULAR | Status: DC | PRN
  Filled 2015-12-05: qty 2

## 2015-12-05 MED ORDER — SODIUM CHLORIDE 0.9 % IV SOLN: 1500.0000 mg | INTRAVENOUS | Status: DC

## 2015-12-05 MED ORDER — ALTEPLASE 2 MG IJ SOLR
2.0000 mg | Freq: Once | INTRAMUSCULAR | Status: DC | PRN
  Filled 2015-12-05: qty 2

## 2015-12-05 MED ORDER — ATROPINE SULFATE 1 MG/10ML IJ SOSY
1.0000 mg | PREFILLED_SYRINGE | Freq: Once | INTRAMUSCULAR | Status: AC
  Administered 2015-12-05: 1 mg via INTRAVENOUS

## 2015-12-05 MED ORDER — DEXTROSE 5 % IV SOLN
2.0000 g | Freq: Two times a day (BID) | INTRAVENOUS | Status: DC
  Filled 2015-12-05: qty 2

## 2015-12-05 MED FILL — Medication: Qty: 1 | Status: AC

## 2015-12-05 NOTE — ED Notes (Signed)
This RN attempted x2 for blood, unsuccessful.  Contacted phlebotomy for assistance with blood draw.

## 2015-12-05 NOTE — H&P (Signed)
PULMONARY / CRITICAL CARE MEDICINE   Name: Heidi Campbell MRN: [id number] DOB: [birth year]    ADMISSION DATE:  12/05/2015 CONSULTATION DATE:  12/05/2015  REFERRING MD:  Delano Metzobert Schertz, MD  CHIEF COMPLAINT:  Hyperkalemia and ARF  HISTORY OF PRESENT ILLNESS:   History was provided by son and daughters who are at bedside. Patient had bilateral L3-L4 decompressive laminotomies and foraminotomies and fusion L3-L4 degenerative spondylolisthesis with stenosis on 10/06 and discharged to rehab on 10/09.  She was in her usual state until last night when she looked sleepy, in and out. Today her son was called by rehab out of concern for tow episodes of emesis (uncharacterized), poor PO intake, decreased urine output and disorientation. Then they took her [facility] and she was found to have hyperkalemia to 8.5, ARF with creatinine to 8.7 and uremia to 100. Patient has no history of CKD. Her creatinine was 0.98 on 70/21/2017. She had no T-wave peaking. She was given runs of Ca-gluc, Ca-chloride, IV insulin and Dextrose. Vital signs were stable but she had lactic acid to 2.58. She received  3L NS boluses. Blood cultures were drawn. She received CTX, Ampicillin and Vancomycin. Her repeat K was down to 7.1. iTrop was 0.05.   [facility] and [facility].  Patient denies chest pain,  yyspnea or abdominal pain.  PAST MEDICAL HISTORY :  She  has a past medical history of ALLERGIC RHINITIS; Arthritis; ASTHMA; Asthma; Diabetes mellitus without complication (HCC); Esophageal reflux; Hypertension; and SINUSITIS, ACUTE.  PAST SURGICAL HISTORY: She  has a past surgical history that includes Abdominal hysterectomy; Breast surgery; arthroscopic knee (Right, 2015); Hand surgery (Left); Breast reduction surgery (Bilateral, 2006); and Back surgery.  Allergies  Allergen Reactions  . Latex     Unknown reaction per MAR   . Ace Inhibitors Cough  . Shellfish Allergy Other (See Comments)     Pt cannot recall at this time    No current facility-administered medications on file prior to encounter.    Current Outpatient Prescriptions on File Prior to Encounter  Medication Sig  . albuterol (PROVENTIL HFA;VENTOLIN HFA) 108 (90 BASE) MCG/ACT inhaler Inhale 2 puffs into the lungs every 6 (six) hours as needed for wheezing or shortness of breath.  Marland Kitchen. amLODipine (NORVASC) 10 MG tablet Take 1 tablet (10 mg total) by mouth daily. (Patient taking differently: Take 10 mg by mouth daily with breakfast. )  . cloNIDine (CATAPRES) 0.2 MG tablet Take 0.2 mg by mouth 2 (two) times daily.   Marland Kitchen. doxazosin (CARDURA) 4 MG tablet take 1 TABLET AT BEDTIME (Patient taking differently: Take 4 mg by mouth at bedtime. )  . ezetimibe (ZETIA) 10 MG tablet Take 1 tablet (10 mg total) by mouth daily. (Patient taking differently: Take 10 mg by mouth daily with breakfast. )  . fluticasone (FLONASE) 50 MCG/ACT nasal spray Place 1 spray into both nostrils daily as needed for allergies.   . Fluticasone-Salmeterol (ADVAIR) 100-50 MCG/DOSE AEPB Inhale 1 puff into the lungs 2 (two) times daily.   Marland Kitchen. glucose blood (ONETOUCH VERIO) test strip TEST BLOOD SUGAR twice a day Dx code E11.9  . hydrALAZINE (APRESOLINE) 50 MG tablet Take 1 tablet by mouth 3 times a day. (Patient taking differently: Take 50 mg by mouth 3 (three) times daily. )  . insulin aspart (NOVOLOG FLEXPEN) 100 UNIT/ML FlexPen Inject 15 units twice a day (Patient taking differently: Inject 15 Units into the skin 2 (two) times daily. )  .  Insulin Glargine (LANTUS) 100 UNIT/ML Solostar Pen Inject 34 Units into the skin daily at 10 pm. (Patient taking differently: Inject 34 Units into the skin at bedtime. )  . Insulin Pen Needle (BD PEN NEEDLE NANO U/F) 32G X 4 MM MISC Use 4 per day  . irbesartan-hydrochlorothiazide (AVALIDE) 300-12.5 MG per tablet Take 0.5 tablets by mouth daily.  . Liraglutide (VICTOZA) 18 MG/3ML SOPN inject 1.2 milligram subcutaneously daily  (Patient taking differently: Inject 1.2 mg into the skin daily. )  . metFORMIN (GLUCOPHAGE) 1000 MG tablet Take 1 tablet (1,000 mg total) by mouth 2 (two) times daily with a meal.  . methocarbamol (ROBAXIN) 500 MG tablet Take 1 tablet (500 mg total) by mouth 2 (two) times daily.  Marland Kitchen omeprazole (PRILOSEC) 20 MG capsule Take 20 mg by mouth daily at 6 (six) AM.   . ondansetron (ZOFRAN ODT) 4 MG disintegrating tablet Take 1 tablet (4 mg total) by mouth every 8 (eight) hours as needed for nausea or vomiting.  Letta Pate DELICA LANCETS 33G MISC Use to check blood sugar twice a day dx code E11.9  . polyethylene glycol (MIRALAX / GLYCOLAX) packet Take 17 g by mouth daily as needed for moderate constipation.   . rosuvastatin (CRESTOR) 40 MG tablet Take 1 tablet (40 mg total) by mouth daily. (Patient taking differently: Take 40 mg by mouth daily with breakfast. )  . spironolactone (ALDACTONE) 50 MG tablet take 1 tablet by mouth once daily (Patient taking differently: take 1 tablet by mouth everyday in the morning)  . diazepam (VALIUM) 5 MG tablet Take 1-2 tablets (5-10 mg total) by mouth every 6 (six) hours as needed for muscle spasms. (Patient not taking: Reported on 12/05/2015)  . oxyCODONE-acetaminophen (PERCOCET/ROXICET) 5-325 MG tablet Take 1-2 tablets by mouth every 4 (four) hours as needed for severe pain. (Patient not taking: Reported on 12/05/2015)    FAMILY HISTORY:  Her indicated that her mother is deceased. She indicated that her father is deceased.    SOCIAL HISTORY: She  reports that she has never smoked. She has never used smokeless tobacco. She reports that she does not drink alcohol or use drugs.  VITAL SIGNS: BP (!) 81/47   Pulse 62   Temp (!) 96.5 F (35.8 C) (Rectal)   Resp 14   SpO2 100%   HEMODYNAMICS:    VENTILATOR SETTINGS:    INTAKE / OUTPUT: I/O last 3 completed shifts: In: 110 [IV Piggyback:110] Out: -   PHYSICAL EXAMINATION: GEN: Somnolent but intermittent  respond to questions Head: normocephalic and atraumatic  Eyes: without conjunctival injection, sclera anicteric, pupils equal round and reactive to light CVS: RRR, normal s1 and s2, no murmurs, no edema, DP pulses 2+ bilaterally RESP: no increased work of breathing, good air movement bilaterally, no crackles or wheeze.  GI: Bowel sounds present and normal, soft, non-tender,non-distended GU: Foley catheter in place SKIN: Wasn't able to assess the surgical wound on the back NEURO: Somnolent, intermittently responds to questions, she is oriented to self, place, person, year but not to date and month. No apparent cranial level deficits. Patellar reflex 2+ bilaterally  LABS:  BMET  Recent Labs Lab 12/05/15 1454 12/05/15 1500 12/05/15 1733  NA 123* 128*  --   K 8.3* 8.4* 7.1*  CL 100* 98*  --   CO2  --  18*  --   BUN 109* 100*  --   CREATININE 10.00* 8.70*  --   GLUCOSE 83 82  --  Electrolytes  Recent Labs Lab 12/05/15 1500  CALCIUM 8.6*    CBC  Recent Labs Lab 12/05/15 1454 12/05/15 1500  WBC  --  10.6*  HGB 8.8* 9.2*  HCT 26.0* 28.5*  PLT  --  277    Coag's No results for input(s): APTT, INR in the last 168 hours.  Sepsis Markers  Recent Labs Lab 12/05/15 1454 12/05/15 1745  LATICACIDVEN 1.93* 2.58*    ABG  Recent Labs Lab 12/05/15 1458  PHART 7.264*  PCO2ART 37.6  PO2ART 336*    Liver Enzymes  Recent Labs Lab 12/05/15 1500  AST 35  ALT 23  ALKPHOS 52  BILITOT 0.8  ALBUMIN 3.3*    Cardiac Enzymes No results for input(s): TROPONINI, PROBNP in the last 168 hours.  Glucose  Recent Labs Lab 12/05/15 1434  GLUCAP 83    Imaging  STUDIES:  11/3-CXR: Enlargement of cardiac silhouette with mild RIGHT basilar atelectasis.  CULTURES: 11/3-blood cultures>>  ANTIBIOTICS: Vanc 11/3>> CTX 11/3>>  SIGNIFICANT EVENTS: 11/3 presented to [facility] with K of 8.5, BUN 100 and Cr 8.7  LINES/TUBES: PIV in left  arm Foley  DISCUSSION: Patient with no prior renal disease, now with hypokalemia to 8.5, AKI (sCr 8.7) and uremia (BUN 100). Somnolence likely due to uremia. She is also on pain medications and benzo that can contribute to this. She has a lactic acid of 2.58. Lactic acidosis likely due to metformin versus infectious etiology. White blood cell 10.6.  ASSESSMENT / PLAN:  PULMONARY A:  -Asthma-stable -CXR without infiltrate or edema  P:   -Oxygen as needed to maintain saturation over 92% -Albuterol as needed  CARDIOVASCULAR A:  -HTN: stable.  -?CAD -CXR with cardiomegaly.  -EKG without ischemic changes  P:  -Hold home BP  meds -BNP -Echocardiogram -IV fluid as needed   RENAL A:   -Hyperkalemia: S/p Ca-gluc, Ca-chloride, IV insulin, Dextrose, Albuterol -Hyponatremia:  -Uremia -ARF P:   -Serial BMP and lytes -HD cath for hemodialysis -HD per nephro  GASTROINTESTINAL A:   -Emesis at home  P:   -NPO pending MS improvement  -RN Swallow Eval -Pepcid  HEMATOLOGIC A:   -Anemia: Hgb 9.2 (b/l 11) -Mild leukocytosis likely stress induced  P:  -Serial CBC  INFECTIOUS A:   -Mild lactic acidosis to 2.58 -Surgical wound P:   -Wound care conslt -Urinalysis -Follow cultures -Continue Vanc/CTX  ENDOCRINE A:   -Diabetes P:   -SSI-renal  NEUROLOGIC A:   -Somnolence likely due to uremia vs sepsis. Ammonia normal -S/p Narcan P:   -Continue monitoring -CT head if worsening -UDS  FAMILY  - Updates: Son and daughters at bedside  - Daughter Reinaldo Berber phone [phone]) HCPOA. She didn't bring the paper to hospital   Candelaria Stagers, MD.  12/05/2015 11:00 PM PGY-2, [facility]   12/05/2015, 11:00 PM   70 yo female had lumbar spine surgery on 11/07/15.  She initially was d/c to home.  Family reports she was having trouble with standing/walking, and was placed in rehab.  She was reported to have trouble with urine incontinence, but  then developed urine retention.  She was also having trouble with constipation.  She was able to move legs.  She developed progressive mental status change.  She was brought to ER at [facility].  She was found to have acute renal failure, hyperkalemia, and acidosis.  She was given tx for acute correction of hyperkalemia.  She was seen by nephrology.  [facility].  She reports feeling symptomatic improvement after receiving several IV fluid boluses.  Family reports that she also seems more alert.  She had foley catheter placed, and has been making urine.  Somnolent, but wakes up easily.  Moves all extremities. Follows commands.  HR regular.  No wheeze/  Abd soft.  Mild drainage from back wound site.  Repeat labs pending.  Assessment/plan:  Acute renal failure likely from volume depletion and medications. Metabolic acidosis. Hyperkalemia. - repeat labs now >> if she is having improvement in renal fx, potassium, and acidosis then might be able to avoid HD - continue HCO3 in IV fluid - continue foley catheter - check renal u/s   UTI. Back wound. - adjust Abx  - f/u culture results  Recent lumbar spine surgery with gait difficulty, and report of urine retention and constipation. - will ask neurosurgery to assess  Updated family at bedside.  CC time by me independent of resident time 38 minutes.  Coralyn HellingVineet Matthew Pais, MD [facility] 12/06/2015, 12:18 AM Pager:  [phone] After 3pm call: [phone]

## 2015-12-05 NOTE — Consult Note (Signed)
Renal Service Consult Note [facility]  Heidi Campbell 12/05/2015 Heidi Campbell Requesting Physician:  Heidi Campbell  Reason for Consult:  Acute renal failure/ hyperkalemia HPI: The patient is a 70 y.o. year-old with history of HTN, DM, asthma , had back surgery one month ago, went home but then couldn't get up so was placed in a SNF.  Over the last week , per family, has had recurrent N/V that started on Sat , the first they saw of it at least.  Sent to ED today for AMS.  In ED creat was 10, K 8.3 on iSTAT, bradycardic w idiovent rhythm vs slow afib in the 30's.  AFter IV Ca heart rate improved to 80's and repeat EKG shows NSR 83 bpm.    Patient was obtunded but now is responding but sluggish.  Family reports 30 yr hx of DM, on pills and insulin, no retinopathy per family.  HTN. Asthma.  NO hx kidney failure.  Is on ARB at home.  No nsaid's.  They said that she developed urinary incontinence and then that changed to no UOP at all over the past few days.     ROS  n/a   Past Medical History  Past Medical History:  Diagnosis Date  . ALLERGIC RHINITIS   . Arthritis   . ASTHMA   . Asthma   . Diabetes mellitus without complication (HCC)   . Esophageal reflux   . Hypertension   . SINUSITIS, ACUTE    Past Surgical History  Past Surgical History:  Procedure Laterality Date  . ABDOMINAL HYSTERECTOMY    . arthroscopic knee Right 2015  . BACK SURGERY    . BREAST REDUCTION SURGERY Bilateral 2006  . BREAST SURGERY    . HAND SURGERY Left    damaged nerve in hand   Family History  Family History  Problem Relation Age of Onset  . Diabetes Mother   . Heart attack Father   . Heart disease Father    Social History  reports that she has never smoked. She has never used smokeless tobacco. She reports that she does not drink alcohol or use drugs. Allergies  Allergies  Allergen Reactions  . Latex     Unknown reaction per MAR   . Ace Inhibitors Cough  . Shellfish  Allergy Other (See Comments)    Pt cannot recall at this time   Home medications Prior to Admission medications   Medication Sig Start Date End Date Taking? Authorizing Provider  acetaminophen (TYLENOL) 325 MG tablet Take 325-650 mg by mouth 3 (three) times daily.   Yes Historical Provider, MD  albuterol (PROVENTIL HFA;VENTOLIN HFA) 108 (90 BASE) MCG/ACT inhaler Inhale 2 puffs into the lungs every 6 (six) hours as needed for wheezing or shortness of breath.   Yes Historical Provider, MD  amLODipine (NORVASC) 10 MG tablet Take 1 tablet (10 mg total) by mouth daily. Patient taking differently: Take 10 mg by mouth daily with breakfast.  07/26/14  Yes Donnetta Hutching, MD  anti-nausea (EMETROL) solution Take 15 mLs by mouth every 15 (fifteen) minutes as needed for nausea or vomiting (until distress subsides). >1 hour >5 doses   Yes Historical Provider, MD  aspirin EC 81 MG tablet Take 81 mg by mouth daily with breakfast.   Yes Historical Provider, MD  bisacodyl (DULCOLAX) 10 MG suppository Place 10 mg rectally as needed for moderate constipation (every 4 hours for constipation).   Yes Historical Provider, MD  cephALEXin (KEFLEX) 500 MG capsule  Take 500 mg by mouth every 6 (six) hours. Started 11/02 for 15 days  Taking for wound   Yes Historical Provider, MD  cloNIDine (CATAPRES) 0.2 MG tablet Take 0.2 mg by mouth 2 (two) times daily.    Yes Historical Provider, MD  doxazosin (CARDURA) 4 MG tablet take 1 TABLET AT BEDTIME Patient taking differently: Take 4 mg by mouth at bedtime.  04/15/15  Yes Heidi LittlerAjay Kumar, MD  ezetimibe (ZETIA) 10 MG tablet Take 1 tablet (10 mg total) by mouth daily. Patient taking differently: Take 10 mg by mouth daily with breakfast.  07/20/13  Yes Heidi LittlerAjay Kumar, MD  fluticasone (FLONASE) 50 MCG/ACT nasal spray Place 1 spray into both nostrils daily as needed for allergies.    Yes Historical Provider, MD  Fluticasone-Salmeterol (ADVAIR) 100-50 MCG/DOSE AEPB Inhale 1 puff into the lungs 2 (two)  times daily.    Yes Historical Provider, MD  glucose blood (ONETOUCH VERIO) test strip TEST BLOOD SUGAR twice a day Dx code E11.9 04/15/15  Yes Heidi LittlerAjay Kumar, MD  hydrALAZINE (APRESOLINE) 50 MG tablet Take 1 tablet by mouth 3 times a day. Patient taking differently: Take 50 mg by mouth 3 (three) times daily.  10/17/14  Yes Heidi CraftsJayadeep S Varanasi, MD  insulin aspart (NOVOLOG FLEXPEN) 100 UNIT/ML FlexPen Inject 15 units twice a day Patient taking differently: Inject 15 Units into the skin 2 (two) times daily.  04/15/15  Yes Heidi LittlerAjay Kumar, MD  Insulin Glargine (LANTUS) 100 UNIT/ML Solostar Pen Inject 34 Units into the skin daily at 10 pm. Patient taking differently: Inject 34 Units into the skin at bedtime.  04/15/15  Yes Heidi LittlerAjay Kumar, MD  Insulin Pen Needle (BD PEN NEEDLE NANO U/F) 32G X 4 MM MISC Use 4 per day 04/15/15  Yes Heidi LittlerAjay Kumar, MD  irbesartan-hydrochlorothiazide (AVALIDE) 300-12.5 MG per tablet Take 0.5 tablets by mouth daily.   Yes Historical Provider, MD  Liraglutide (VICTOZA) 18 MG/3ML SOPN inject 1.2 milligram subcutaneously daily Patient taking differently: Inject 1.2 mg into the skin daily.  04/15/15  Yes Heidi LittlerAjay Kumar, MD  metFORMIN (GLUCOPHAGE) 1000 MG tablet Take 1 tablet (1,000 mg total) by mouth 2 (two) times daily with a meal. 04/15/15  Yes Heidi LittlerAjay Kumar, MD  methocarbamol (ROBAXIN) 500 MG tablet Take 1 tablet (500 mg total) by mouth 2 (two) times daily. 01/27/15  Yes Heidi C Joy, PA-C  omeprazole (PRILOSEC) 20 MG capsule Take 20 mg by mouth daily at 6 (six) AM.    Yes Historical Provider, MD  ondansetron (ZOFRAN ODT) 4 MG disintegrating tablet Take 1 tablet (4 mg total) by mouth every 8 (eight) hours as needed for nausea or vomiting. 01/27/15  Yes Heidi C Joy, PA-C  ONETOUCH DELICA LANCETS 33G MISC Use to check blood sugar twice a day dx code E11.9 04/15/15  Yes Heidi LittlerAjay Kumar, MD  polyethylene glycol (MIRALAX / GLYCOLAX) packet Take 17 g by mouth daily as needed for moderate constipation.    Yes Historical  Provider, MD  rosuvastatin (CRESTOR) 40 MG tablet Take 1 tablet (40 mg total) by mouth daily. Patient taking differently: Take 40 mg by mouth daily with breakfast.  12/22/12  Yes Heidi CraftsJayadeep S Varanasi, MD  sertraline (ZOLOFT) 50 MG tablet Take 50 mg by mouth daily with breakfast.    Yes Historical Provider, MD  spironolactone (ALDACTONE) 50 MG tablet take 1 tablet by mouth once daily Patient taking differently: take 1 tablet by mouth everyday in the morning 11/03/15  Yes Heidi CraftsJayadeep S Varanasi, MD  diazepam (  VALIUM) 5 MG tablet Take 1-2 tablets (5-10 mg total) by mouth every 6 (six) hours as needed for muscle spasms. Patient not taking: Reported on 12/05/2015 11/10/15   Julio SicksHenry Pool, MD  gabapentin (NEURONTIN) 100 MG capsule Take 200 mg by mouth 3 (three) times daily.    Historical Provider, MD  oxyCODONE-acetaminophen (PERCOCET/ROXICET) 5-325 MG tablet Take 1-2 tablets by mouth every 4 (four) hours as needed for severe pain. Patient not taking: Reported on 12/05/2015 11/10/15   Julio SicksHenry Pool, MD   Liver Function Tests  Recent Labs Lab 12/05/15 1500  AST 35  ALT 23  ALKPHOS 52  BILITOT 0.8  PROT 7.1  ALBUMIN 3.3*   No results for input(s): LIPASE, AMYLASE in the last 168 hours. CBC  Recent Labs Lab 12/05/15 1454 12/05/15 1500  WBC  --  10.6*  NEUTROABS  --  7.9*  HGB 8.8* 9.2*  HCT 26.0* 28.5*  MCV  --  88.0  PLT  --  277   Basic Metabolic Panel  Recent Labs Lab 12/05/15 1454 12/05/15 1500  NA 123* 128*  K 8.3* 8.4*  CL 100* 98*  CO2  --  18*  GLUCOSE 83 82  BUN 109* 100*  CREATININE 10.00* 8.70*  CALCIUM  --  8.6*   Iron/TIBC/Ferritin/ %Sat No results found for: IRON, TIBC, FERRITIN, IRONPCTSAT  Vitals:   12/05/15 1525 12/05/15 1530 12/05/15 1543 12/05/15 1558  BP: 125/96 (!) 136/54  (!) 100/51  Pulse: 73   80  Resp: 13 12  10   Temp:   (!) 96.5 F (35.8 C)   TempSrc:   Rectal   SpO2: 100%   99%   Exam Gen lethargic, arousable, some twitching/ mild jerking No  rash, cyanosis or gangrene Sclera anicteric, throat clear  No jvd or bruits Chest clear bilat RRR no MRG Abd soft ntnd no mass or ascites +bs GU foley in place, large sediment/ cloudy urine MS no joint effusions or deformity Ext no edema / back wound mostly healed over, no odor or signs of infection Neuro as above, moves all ext , Ox 3  UA - pending  Assessment: 1. Acute renal failure - creat was 1.06 on 10/23 at time of back surgery.  Vol depletion/ hypotension / ARB most likely cause.  UA pending.  Severe hyperkalemia and uremia.  Will need dialysis.  [facility], HD later this evening.  She likely will recover renal function w rehydration over the next several days.   2. Back surg 10/23 3. N/V 4. VOl depletion 5. AMS - due to uremia 6. Hyperkalemia - severe 7. DM 30 yrs 8. HTN    Plan - hold all BP meds, [facility], will need temp cath per CCM and HD orders written for later this evening.    Vinson Moselleob Keviana Guida MD [facility] pager [phone]   12/05/2015, 4:50 PM

## 2015-12-05 NOTE — ED Notes (Signed)
Bed: WA15 Expected date:  Expected time:  Means of arrival:  Comments: EMS hypoglycemia  

## 2015-12-05 NOTE — ED Notes (Signed)
Report given to Far[facility]achel [facility].

## 2015-12-05 NOTE — ED Provider Notes (Signed)
[facility]-EMERGENCY DEPT Provider Note   CSN: [id number] Arrival date & time: 12/05/15  1420     History   Chief Complaint Chief Complaint  Patient presents with  . AMS    HPI Heidi Campbell is a 70 y.o. female.  HPI Patient is brought from a nursing home facility for rehabilitation for a back surgery. A consideration is a very limited historian as she is very ill upon arrival. She is denying focal pain. To the best of her recollection, she fairly acutely felt very hot and then immediately very unwell. There is very limited history from nursing facility. Family members arrived slightly later and were able to add that the patient has been somewhat unwell for about a week. She has had decreased appetite and intermittent vomiting. She is seems very fatigued and has been sleeping a lot. Past Medical History:  Diagnosis Date  . ALLERGIC RHINITIS   . Arthritis   . ASTHMA   . Asthma   . Diabetes mellitus without complication (HCC)   . Esophageal reflux   . Hypertension   . SINUSITIS, ACUTE     Patient Active Problem List   Diagnosis Date Noted  . Degenerative spondylolisthesis 11/07/2015  . Carotid artery disease (HCC) 06/20/2014  . Bradycardia 10/12/2013  . Fatigue 10/12/2013  . Hyperlipidemia 05/14/2013  . Type II or unspecified type diabetes mellitus without mention of complication, uncontrolled 02/19/2013  . Coronary atherosclerosis of native coronary artery 12/22/2012  . Obesity 12/22/2012  . Hypertension   . SINUSITIS, ACUTE 04/01/2008  . Type II or unspecified type diabetes mellitus without mention of complication, not stated as uncontrolled 02/23/2007  . ALLERGIC RHINITIS 02/23/2007  . ASTHMA 02/23/2007  . Esophageal reflux 02/23/2007    Past Surgical History:  Procedure Laterality Date  . ABDOMINAL HYSTERECTOMY    . arthroscopic knee Right 2015  . BACK SURGERY    . BREAST REDUCTION SURGERY Bilateral 2006  . BREAST SURGERY    . HAND SURGERY Left    damaged nerve  in hand    OB History    No data available       Home Medications    Prior to Admission medications   Medication Sig Start Date End Date Taking? Authorizing Provider  acetaminophen (TYLENOL) 325 MG tablet Take 325-650 mg by mouth 3 (three) times daily.   Yes Historical Provider, MD  albuterol (PROVENTIL HFA;VENTOLIN HFA) 108 (90 BASE) MCG/ACT inhaler Inhale 2 puffs into the lungs every 6 (six) hours as needed for wheezing or shortness of breath.   Yes Historical Provider, MD  amLODipine (NORVASC) 10 MG tablet Take 1 tablet (10 mg total) by mouth daily. Patient taking differently: Take 10 mg by mouth daily with breakfast.  07/26/14  Yes Donnetta HutchingBrian Cook, MD  anti-nausea (EMETROL) solution Take 15 mLs by mouth every 15 (fifteen) minutes as needed for nausea or vomiting (until distress subsides). >1 hour >5 doses   Yes Historical Provider, MD  aspirin EC 81 MG tablet Take 81 mg by mouth daily with breakfast.   Yes Historical Provider, MD  bisacodyl (DULCOLAX) 10 MG suppository Place 10 mg rectally as needed for moderate constipation (every 4 hours for constipation).   Yes Historical Provider, MD  cephALEXin (KEFLEX) 500 MG capsule Take 500 mg by mouth every 6 (six) hours. Started 11/02 for 15 days  Taking for wound   Yes Historical Provider, MD  cloNIDine (CATAPRES) 0.2 MG tablet Take 0.2 mg by mouth 2 (two) times daily.  Yes Historical Provider, MD  doxazosin (CARDURA) 4 MG tablet take 1 TABLET AT BEDTIME Patient taking differently: Take 4 mg by mouth at bedtime.  04/15/15  Yes Reather Littler, MD  ezetimibe (ZETIA) 10 MG tablet Take 1 tablet (10 mg total) by mouth daily. Patient taking differently: Take 10 mg by mouth daily with breakfast.  07/20/13  Yes Reather Littler, MD  fluticasone (FLONASE) 50 MCG/ACT nasal spray Place 1 spray into both nostrils daily as needed for allergies.    Yes Historical Provider, MD  Fluticasone-Salmeterol (ADVAIR) 100-50 MCG/DOSE AEPB Inhale 1 puff into the lungs 2 (two)  times daily.    Yes Historical Provider, MD  glucose blood (ONETOUCH VERIO) test strip TEST BLOOD SUGAR twice a day Dx code E11.9 04/15/15  Yes Reather Littler, MD  hydrALAZINE (APRESOLINE) 50 MG tablet Take 1 tablet by mouth 3 times a day. Patient taking differently: Take 50 mg by mouth 3 (three) times daily.  10/17/14  Yes Corky Crafts, MD  insulin aspart (NOVOLOG FLEXPEN) 100 UNIT/ML FlexPen Inject 15 units twice a day Patient taking differently: Inject 15 Units into the skin 2 (two) times daily.  04/15/15  Yes Reather Littler, MD  Insulin Glargine (LANTUS) 100 UNIT/ML Solostar Pen Inject 34 Units into the skin daily at 10 pm. Patient taking differently: Inject 34 Units into the skin at bedtime.  04/15/15  Yes Reather Littler, MD  Insulin Pen Needle (BD PEN NEEDLE NANO U/F) 32G X 4 MM MISC Use 4 per day 04/15/15  Yes Reather Littler, MD  irbesartan-hydrochlorothiazide (AVALIDE) 300-12.5 MG per tablet Take 0.5 tablets by mouth daily.   Yes Historical Provider, MD  Liraglutide (VICTOZA) 18 MG/3ML SOPN inject 1.2 milligram subcutaneously daily Patient taking differently: Inject 1.2 mg into the skin daily.  04/15/15  Yes Reather Littler, MD  metFORMIN (GLUCOPHAGE) 1000 MG tablet Take 1 tablet (1,000 mg total) by mouth 2 (two) times daily with a meal. 04/15/15  Yes Reather Littler, MD  methocarbamol (ROBAXIN) 500 MG tablet Take 1 tablet (500 mg total) by mouth 2 (two) times daily. 01/27/15  Yes Shawn C Joy, PA-C  omeprazole (PRILOSEC) 20 MG capsule Take 20 mg by mouth daily at 6 (six) AM.    Yes Historical Provider, MD  ondansetron (ZOFRAN ODT) 4 MG disintegrating tablet Take 1 tablet (4 mg total) by mouth every 8 (eight) hours as needed for nausea or vomiting. 01/27/15  Yes Shawn C Joy, PA-C  ONETOUCH DELICA LANCETS 33G MISC Use to check blood sugar twice a day dx code E11.9 04/15/15  Yes Reather Littler, MD  polyethylene glycol (MIRALAX / GLYCOLAX) packet Take 17 g by mouth daily as needed for moderate constipation.    Yes Historical  Provider, MD  rosuvastatin (CRESTOR) 40 MG tablet Take 1 tablet (40 mg total) by mouth daily. Patient taking differently: Take 40 mg by mouth daily with breakfast.  12/22/12  Yes Corky Crafts, MD  sertraline (ZOLOFT) 50 MG tablet Take 50 mg by mouth daily with breakfast.    Yes Historical Provider, MD  spironolactone (ALDACTONE) 50 MG tablet take 1 tablet by mouth once daily Patient taking differently: take 1 tablet by mouth everyday in the morning 11/03/15  Yes Corky Crafts, MD  diazepam (VALIUM) 5 MG tablet Take 1-2 tablets (5-10 mg total) by mouth every 6 (six) hours as needed for muscle spasms. Patient not taking: Reported on 12/05/2015 11/10/15   Julio Sicks, MD  gabapentin (NEURONTIN) 100 MG capsule Take 200 mg  by mouth 3 (three) times daily.    Historical Provider, MD  oxyCODONE-acetaminophen (PERCOCET/ROXICET) 5-325 MG tablet Take 1-2 tablets by mouth every 4 (four) hours as needed for severe pain. Patient not taking: Reported on 12/05/2015 11/10/15   Julio Sicks, MD    Family History Family History  Problem Relation Age of Onset  . Diabetes Mother   . Heart attack Father   . Heart disease Father     Social History Social History  Substance Use Topics  . Smoking status: Never Smoker  . Smokeless tobacco: Never Used  . Alcohol use No     Allergies   Latex; Ace inhibitors; and Shellfish allergy   Review of Systems Review of Systems Cannot obtain due to patient condition  Physical Exam Updated Vital Signs BP (!) 100/51   Pulse 80   Temp (!) 96.5 F (35.8 C) (Rectal)   Resp 10   SpO2 99%   Physical Exam  Constitutional:  Patient is very pale in appearance. She is slightly diaphoretic. Morbidly obese. No respiratory distress at rest. Patient appears severely ill.  HENT:  Head: Normocephalic and atraumatic.  Mucous memories are pale and slightly dry.  Eyes: Conjunctivae and EOM are normal. Pupils are equal, round, and reactive to light.  Neck: Neck  supple.  Cardiovascular:  Heart sounds are very distant. Irregular bradycardic. Femoral pulse is palpable that correlates with heart rhythm.  Pulmonary/Chest:  Patient is not in respiratory distress. With anterior auscultation breath sounds are present bilaterally.  Abdominal:  Patient's abdomen is soft, morbidly obese. She is not endorsing pain to palpation.  Musculoskeletal:  No significant peripheral edema. No evidence of cellulitis or skin wounds of the lower extremity. Visual inspection of the patient's surgical site on her back shows a approximately 10 cm linear incision that has minor dehiscence with granulation tissue present. No surrounding erythema or cellulitis. No active drainage.  Neurological:  Patient is very somnolent but she does answer several questions appropriately. She is too weak to follow commands for focal motor testing. He can perform weak grip strength with her hands.  Skin:  Skin is pale and cool.  Psychiatric:  Patient is calm and cooperative.     ED Treatments / Results  Labs (all labs ordered are listed, but only abnormal results are displayed) Labs Reviewed  COMPREHENSIVE METABOLIC PANEL - Abnormal; Notable for the following:       Result Value   Sodium 128 (*)    Potassium 8.4 (*)    Chloride 98 (*)    CO2 18 (*)    Creatinine, Ser 8.70 (*)    Calcium 8.6 (*)    Albumin 3.3 (*)    GFR calc non Af Amer 4 (*)    GFR calc Af Amer 5 (*)    All other components within normal limits  CBC WITH DIFFERENTIAL/PLATELET - Abnormal; Notable for the following:    WBC 10.6 (*)    RBC 3.24 (*)    Hemoglobin 9.2 (*)    HCT 28.5 (*)    Neutro Abs 7.9 (*)    Monocytes Absolute 1.3 (*)    All other components within normal limits  BLOOD GAS, ARTERIAL - Abnormal; Notable for the following:    pH, Arterial 7.264 (*)    pO2, Arterial 336 (*)    Bicarbonate 16.5 (*)    Acid-base deficit 9.3 (*)    All other components within normal limits  I-STAT CG4 LACTIC  ACID, ED - Abnormal; Notable for  the following:    Lactic Acid, Venous 1.93 (*)    All other components within normal limits  I-STAT CHEM 8, ED - Abnormal; Notable for the following:    Sodium 123 (*)    Potassium 8.3 (*)    Chloride 100 (*)    BUN 109 (*)    Creatinine, Ser 10.00 (*)    Calcium, Ion 1.00 (*)    Hemoglobin 8.8 (*)    HCT 26.0 (*)    All other components within normal limits  CULTURE, BLOOD (ROUTINE X 2)  CULTURE, BLOOD (ROUTINE X 2)  URINE CULTURE  URINALYSIS, ROUTINE W REFLEX MICROSCOPIC (NOT AT [facility])  AMMONIA  POTASSIUM  CBG MONITORING, ED  I-STAT CG4 LACTIC ACID, ED  Rosezena Sensor, ED    EKG  EKG Interpretation  Date/Time:  Friday December 05 2015 14:30:00 EDT Ventricular Rate:  38 PR Interval:    QRS Duration: 129 QT Interval:  436 QTC Calculation: 347 R Axis:   115 Text Interpretation:  Atrial fibrillation Nonspecific intraventricular conduction delay agree Confirmed by Donnald Garre, MD, Lebron Conners [phone]) on 12/05/2015 4:02:09 PM       Radiology Dg Chest Port 1 View  Result Date: 12/05/2015 CLINICAL DATA:  Unresponsive, hypotensive, bradycardia, history asthma, hypertension, diabetes mellitus EXAM: PORTABLE CHEST 1 VIEW COMPARISON:  Portable exam 1451 hours compared to 11/24/2015 FINDINGS: Enlargement of cardiac silhouette. Atherosclerotic calcification aorta. Mediastinal contours and pulmonary vascularity normal. Mild RIGHT basilar atelectasis. Lungs otherwise clear. No pleural effusion or pneumothorax. IMPRESSION: Enlargement of cardiac silhouette with mild RIGHT basilar atelectasis. Aortic atherosclerosis. Electronically Signed   By: Ulyses Southward M.D.   On: 12/05/2015 15:49    Procedures Procedures (including critical care time) CRITICAL CARE Performed by: Arby Barrette   Total critical care time:90 minutes  Critical care time was exclusive of separately billable procedures and treating other patients.  Critical care was necessary to treat or  prevent imminent or life-threatening deterioration.  Critical care was time spent personally by me on the following activities: development of treatment plan with patient and/or surrogate as well as nursing, discussions with consultants, evaluation of patient's response to treatment, examination of patient, obtaining history from patient or surrogate, ordering and performing treatments and interventions, ordering and review of laboratory studies, ordering and review of radiographic studies, pulse oximetry and re-evaluation of patient's condition. Medications Ordered in ED Medications  sodium chloride 0.9 % bolus 1,000 mL (1,000 mLs Intravenous New Bag/Given 12/05/15 1525)    And  sodium chloride 0.9 % bolus 1,000 mL (1,000 mLs Intravenous New Bag/Given 12/05/15 1525)    And  sodium chloride 0.9 % bolus 1,000 mL (not administered)  vancomycin (VANCOCIN) IVPB 1000 mg/200 mL premix (1,000 mg Intravenous New Bag/Given 12/05/15 1527)  ampicillin (OMNIPEN) 2 g in sodium chloride 0.9 % 50 mL IVPB (not administered)  DOPamine (INTROPIN) 800 mg in dextrose 5 % 250 mL (3.2 mg/mL) infusion (0 mcg/kg/min  106.6 kg Intravenous Hold 12/05/15 1533)  sodium polystyrene (KAYEXALATE) 15 GM/60ML suspension 30 g (not administered)  cefTRIAXone (ROCEPHIN) 2 g in dextrose 5 % 50 mL IVPB (0 g Intravenous Stopped 12/05/15 1544)  atropine 1 MG/10ML injection 1 mg (1 mg Intravenous Given 12/05/15 1435)  naloxone (NARCAN) injection 0.4 mg (0.4 mg Intravenous Given 12/05/15 1440)  albuterol (PROVENTIL) (2.5 MG/3ML) 0.083% nebulizer solution 10 mg (10 mg Nebulization Given 12/05/15 1518)  insulin aspart (novoLOG) injection 10 Units (10 Units Intravenous Given 12/05/15 1522)  dextrose 50 % solution 50 mL (50 mLs  Intravenous Given 12/05/15 1515)  calcium chloride injection 1 g (1 g Intravenous Given 12/05/15 1515)     Initial Impression / Assessment and Plan / ED Course  I have reviewed the triage vital signs and the nursing  notes.  Pertinent labs & imaging results that were available during my care of the patient were reviewed by me and considered in my medical decision making (see chart for details).  Clinical Course  Consult: Intensivist consulted shortly after patient arrival. Reviewed with Dr. Marchelle Gearingamaswamy. Will plan the patient evaluated in the emergency department. We'll continue resuscitation. Consult: Cardiology. As patient's bradycardia secondary to hyperkalemia will defer consult at this time it was other indication for cardiac etiology. Consult:(16:05) Dr. Arlean HoppingSchertz nephrology. He advises at this time for admission to hospital service with IV hydration and rectal Kayexalate. He will see the patient and they will continue to monitor her hyperkalemia and renal failure. At this point, he does not suggest emergent dialysis.  Final Clinical Impressions(s) / ED Diagnoses   Final diagnoses:  Hyperkalemia  Acute renal failure, unspecified acute renal failure type (HCC)  Bradycardia  Somnolence   Patient presented profoundly hypotensive with bradycardia and irregular rhythm with long pauses. Resuscitation was immediately initiated with fluid hydration and atropine 1 mg. Pacer pads placed. During the course of diagnostic evaluation i-STAT revealed severe hyperkalemia with renal failure. This is the likely etiology of the patient's presentation. With the administration of calcium chloride, insulin\D50, albuterol and aggressive fluid resuscitation she has made significant improvement. Her pressures have stabilized and heart rate has improved to 80s. Consultations have been made with intensivist, cardiology and nephrology. A shunt will be admitted for ongoing management and treatment. Upon presentation, there was concern for sepsis. Patient was hypothermic, hypotensive and status post back surgery on antibiotics. Her main focus of continuous pain was in her back. Antibiotics have been initiated as well for possible sepsis as  a confounding factor. New Prescriptions New Prescriptions   No medications on file     Arby BarretteMarcy Ramon Brant, MD 12/05/15 1625

## 2015-12-05 NOTE — Progress Notes (Signed)
  Echocardiogram 2D Echocardiogram has been performed.  Leta JunglingCooper, Colbi Schiltz M 12/05/2015, 4:13 PM

## 2015-12-05 NOTE — ED Triage Notes (Signed)
Per EMS-from rehab facility-when EMS arrived she was unresponsive-responded only to pain-hypotensive at 84/42-CBG 53-given 12 1/2 of D 50-did not change mental status-patient obtunded

## 2015-12-05 NOTE — Progress Notes (Signed)
Pharmacy Antibiotic Follow-up Note  Heidi Campbell is a 70 y.o. year-old female admitted on 12/05/2015.  The patient is currently on day 1 of Vancomycin, Ampicillin & Ceftriaxone for rule out meningitis.  Patient in renal failure.  Assessment/Plan: Vancomycin 1gm x1, requesting additional 1gm for 2gm load, then 1500mg   IV every 48 hours.  Goal trough 15-20 mcg/mL.  Verify that additional Vancomycin 1gm was given Ceftriaxone 2gm q12 Ampicillin 2gm x1, then 1gm  q12  No data recorded.  No results for input(s): WBC in the last 168 hours.  Invalid input(s):  CREATININE  Recent Labs Lab 12/05/15 1454  CREATININE 10.00*   Estimated Creatinine Clearance: 6.1 mL/min (by C-G formula based on SCr of 10 mg/dL (H)).    Allergies  Allergen Reactions  . Ace Inhibitors Cough  . Shellfish Allergy Other (See Comments)    Pt cannot recall at this time   Antimicrobials this admission: 11/3 Vancomycin  >>  11/3 Ampicillin >>  11/3 Ceftriaxone >>   Levels/dose changes this admission:  Microbiology results: 11/3 BCx: sent 11/3 UCx: sent   Thank you for allowing pharmacy to be a part of this patient's care.  Otho BellowsGreen, Kary Colaizzi L PharmD Pager [phone] 12/05/2015, 6:27 PM

## 2015-12-06 ENCOUNTER — Inpatient Hospital Stay (HOSPITAL_COMMUNITY): Payer: Medicare Other

## 2015-12-06 DIAGNOSIS — R400 Somnolence: Secondary | ICD-10-CM

## 2015-12-06 DIAGNOSIS — N179 Acute kidney failure, unspecified: Principal | ICD-10-CM

## 2015-12-06 LAB — BASIC METABOLIC PANEL
Anion gap: 11 (ref 5–15)
BUN: 52 mg/dL — AB (ref 6–20)
CALCIUM: 7.8 mg/dL — AB (ref 8.9–10.3)
CO2: 23 mmol/L (ref 22–32)
Chloride: 100 mmol/L — ABNORMAL LOW (ref 101–111)
Creatinine, Ser: 5.88 mg/dL — ABNORMAL HIGH (ref 0.44–1.00)
GFR calc Af Amer: 8 mL/min — ABNORMAL LOW (ref >60)
GFR, EST NON AFRICAN AMERICAN: 7 mL/min — AB (ref >60)
GLUCOSE: 97 mg/dL (ref 65–99)
Potassium: 5 mmol/L (ref 3.5–5.1)
Sodium: 134 mmol/L — ABNORMAL LOW (ref 135–145)

## 2015-12-06 LAB — BLOOD GAS, ARTERIAL
Acid-base deficit: 1.1 mmol/L (ref 0.0–2.0)
Bicarbonate: 23.2 mmol/L (ref 20.0–28.0)
Drawn by: 345601
O2 Content: 2 L/min
O2 Saturation: 98.3 %
Patient temperature: 97.5
pCO2 arterial: 38.3 mmHg (ref 32.0–48.0)
pH, Arterial: 7.395 (ref 7.350–7.450)
pO2, Arterial: 136 mmHg — ABNORMAL HIGH (ref 83.0–108.0)

## 2015-12-06 LAB — GLUCOSE, CAPILLARY
GLUCOSE-CAPILLARY: 115 mg/dL — AB (ref 65–99)
GLUCOSE-CAPILLARY: 133 mg/dL — AB (ref 65–99)
GLUCOSE-CAPILLARY: 144 mg/dL — AB (ref 65–99)
GLUCOSE-CAPILLARY: 88 mg/dL (ref 65–99)
Glucose-Capillary: 78 mg/dL (ref 65–99)
Glucose-Capillary: 87 mg/dL (ref 65–99)
Glucose-Capillary: 88 mg/dL (ref 65–99)

## 2015-12-06 LAB — CBC
HCT: 17.7 % — ABNORMAL LOW (ref 36.0–46.0)
HCT: 25.7 % — ABNORMAL LOW (ref 36.0–46.0)
Hemoglobin: 5.7 g/dL — CL (ref 12.0–15.0)
Hemoglobin: 8.2 g/dL — ABNORMAL LOW (ref 12.0–15.0)
MCH: 27.9 pg (ref 26.0–34.0)
MCH: 27.5 pg (ref 26.0–34.0)
MCHC: 32.2 g/dL (ref 30.0–36.0)
MCHC: 31.9 g/dL (ref 30.0–36.0)
MCV: 86.8 fL (ref 78.0–100.0)
MCV: 86.2 fL (ref 78.0–100.0)
PLATELETS: 261 10*3/uL (ref 150–400)
Platelets: 303 K/uL (ref 150–400)
RBC: 2.04 MIL/uL — ABNORMAL LOW (ref 3.87–5.11)
RBC: 2.98 MIL/uL — ABNORMAL LOW (ref 3.87–5.11)
RDW: 13.4 % (ref 11.5–15.5)
RDW: 13.1 % (ref 11.5–15.5)
WBC: 15.8 K/uL — ABNORMAL HIGH (ref 4.0–10.5)
WBC: 12.7 10*3/uL — AB (ref 4.0–10.5)

## 2015-12-06 LAB — CK: Total CK: 1501 U/L — ABNORMAL HIGH (ref 38–234)

## 2015-12-06 LAB — RENAL FUNCTION PANEL
Albumin: 2.9 g/dL — ABNORMAL LOW (ref 3.5–5.0)
Albumin: 2.5 g/dL — ABNORMAL LOW (ref 3.5–5.0)
Anion gap: 13 (ref 5–15)
Anion gap: 9 (ref 5–15)
BUN: 101 mg/dL — ABNORMAL HIGH (ref 6–20)
BUN: 53 mg/dL — ABNORMAL HIGH (ref 6–20)
CALCIUM: 9.1 mg/dL (ref 8.9–10.3)
CHLORIDE: 99 mmol/L — AB (ref 101–111)
CO2: 24 mmol/L (ref 22–32)
CO2: 14 mmol/L — AB (ref 22–32)
CREATININE: 5.58 mg/dL — AB (ref 0.44–1.00)
CREATININE: 9.01 mg/dL — AB (ref 0.44–1.00)
Calcium: 8.2 mg/dL — ABNORMAL LOW (ref 8.9–10.3)
Chloride: 99 mmol/L — ABNORMAL LOW (ref 101–111)
GFR calc Af Amer: 5 mL/min — ABNORMAL LOW (ref >60)
GFR calc non Af Amer: 4 mL/min — ABNORMAL LOW (ref >60)
GFR, EST AFRICAN AMERICAN: 8 mL/min — AB (ref >60)
GFR, EST NON AFRICAN AMERICAN: 7 mL/min — AB (ref >60)
GLUCOSE: 87 mg/dL (ref 65–99)
Glucose, Bld: 103 mg/dL — ABNORMAL HIGH (ref 65–99)
POTASSIUM: 5.3 mmol/L — AB (ref 3.5–5.1)
Phosphorus: 7.7 mg/dL — ABNORMAL HIGH (ref 2.5–4.6)
Phosphorus: 5.2 mg/dL — ABNORMAL HIGH (ref 2.5–4.6)
Potassium: >7.5 mmol/L (ref 3.5–5.1)
SODIUM: 126 mmol/L — AB (ref 135–145)
Sodium: 132 mmol/L — ABNORMAL LOW (ref 135–145)

## 2015-12-06 LAB — ECHOCARDIOGRAM COMPLETE

## 2015-12-06 LAB — PROCALCITONIN
Procalcitonin: 0.74 ng/mL
Procalcitonin: 0.82 ng/mL

## 2015-12-06 LAB — LACTIC ACID, PLASMA
Lactic Acid, Venous: 2.7 mmol/L (ref 0.5–1.9)
Lactic Acid, Venous: 1.4 mmol/L (ref 0.5–1.9)

## 2015-12-06 LAB — MAGNESIUM
MAGNESIUM: 2.2 mg/dL (ref 1.7–2.4)
Magnesium: 2.3 mg/dL (ref 1.7–2.4)

## 2015-12-06 LAB — TROPONIN I: TROPONIN I: 0.13 ng/mL — AB (ref <0.03)

## 2015-12-06 LAB — BRAIN NATRIURETIC PEPTIDE: B Natriuretic Peptide: 187.8 pg/mL — ABNORMAL HIGH (ref 0.0–100.0)

## 2015-12-06 MED ORDER — SODIUM CHLORIDE 0.9 % IV BOLUS (SEPSIS): 2000.0000 mL | Freq: Once | INTRAVENOUS | Status: DC

## 2015-12-06 MED ORDER — VANCOMYCIN HCL 10 G IV SOLR: 1500.0000 mg | INTRAVENOUS | Status: DC

## 2015-12-06 MED ORDER — ATROPINE SULFATE 1 MG/10ML IJ SOSY
PREFILLED_SYRINGE | INTRAMUSCULAR | Status: AC
  Filled 2015-12-06: qty 10

## 2015-12-06 MED ORDER — PIPERACILLIN-TAZOBACTAM IN DEX 2-0.25 GM/50ML IV SOLN
2.2500 g | Freq: Three times a day (TID) | INTRAVENOUS | Status: DC
  Administered 2015-12-06 – 2015-12-08 (×8): 2.25 g via INTRAVENOUS
  Filled 2015-12-06 (×12): qty 50

## 2015-12-06 MED ORDER — SODIUM BICARBONATE 8.4 % IV SOLN
INTRAVENOUS | Status: DC
  Filled 2015-12-06 (×2): qty 50

## 2015-12-06 MED ORDER — SODIUM CHLORIDE 0.9 % IV BOLUS (SEPSIS)
2000.0000 mL | Freq: Once | INTRAVENOUS | Status: AC
  Administered 2015-12-06: 2000 mL via INTRAVENOUS

## 2015-12-06 MED ORDER — VANCOMYCIN HCL IN DEXTROSE 1-5 GM/200ML-% IV SOLN
1000.0000 mg | INTRAVENOUS | Status: AC
  Administered 2015-12-06: 1000 mg via INTRAVENOUS
  Filled 2015-12-06: qty 200

## 2015-12-06 MED ORDER — SODIUM BICARBONATE 8.4 % IV SOLN
INTRAVENOUS | Status: DC
  Filled 2015-12-06 (×3): qty 150

## 2015-12-06 NOTE — Procedures (Signed)
Central Venous Catheter Insertion Procedure Note Heidi Campbell [id number] 16-Jul-1945  Procedure: Insertion of Hemodialysis Catheter Indications: Acute renal failure with hyperkalemia and metabolic acidosis.  Procedure Details Consent: Risks of procedure as well as the alternatives and risks of each were explained to the (patient/caregiver).  Consent for procedure obtained. Time Out: Verified patient identification, verified procedure, site/side was marked, verified correct patient position, special equipment/implants available, medications/allergies/relevent history reviewed, required imaging and test results available.  Performed  Maximum sterile technique was used including antiseptics, cap, gloves, gown, hand hygiene, mask and sheet. Skin prep: Chlorhexidine; local anesthetic administered A 20 cm hemodialysis catheter was placed in the right internal jugular vein using the Seldinger technique and ultrasound guidance.  Evaluation Blood flow good Complications: No apparent complications Patient did tolerate procedure well. Chest X-ray ordered to verify placement.  CXR: pending.  Coralyn HellingVineet Deshon Hsiao, MD [facility] 12/06/2015, 1:52 AM Pager:  [phone] After 3pm call: [phone]

## 2015-12-06 NOTE — Progress Notes (Signed)
Pharmacy Antibiotic Follow-up Note  Heidi Campbell is a 70 y.o. year-old female [facility] 12/05/[facility].  The patient started on Vancomycin, Ampicillin & Ceftriaxone for sepsis/rule out meningitis. [facility]. Abx changed to Vancomycin and Zosyn for likely source wound infection/UTI. Patient in renal failure - to get HD cath and HD.  Assessment/Plan: Vanc 1gm IV now to complete 2 gm load (this was not given last night) - will f/u HD schedule for further dosing Zosyn 2.25gm IV q8h (dosed over 30 min). Once HD starts, will change to 3.375gm IV q12h (doses over 4 hours) Will f/u micro data and pt's clinical condition  Temp (24hrs), Avg:97 F (36.1 C), Min:96 F (35.6 C), Max:98 F (36.7 C)   Recent Labs Lab 12/05/15 1500 12/06/15 0500  WBC 10.6* 15.8*     Recent Labs Lab 12/05/15 1454 12/05/15 1500 12/05/15 2342  CREATININE 10.00* 8.70* 9.01*   Estimated Creatinine Clearance: 6.8 mL/min (by C-G formula based on SCr of 9.01 mg/dL (H)).    Allergies  Allergen Reactions  . Contrast Media [Iodinated Diagnostic Agents]   . Latex     Unknown reaction per MAR   . Ace Inhibitors Cough  . Shellfish Allergy Other (See Comments)    Pt cannot recall at this time   Antimicrobials this admission: 11/3 Vancomycin  >>  11/3 Ampicillin >>  11/3 Ceftriaxone >>   Levels/dose changes this admission:  Microbiology results: 11/3 BCx: sent 11/3 UCx: sent   Thank you for allowing pharmacy to be a part of this patient's care.  Otho BellowsGreen, Terri L PharmD Pager [phone] 12/06/2015, 7:02 AM

## 2015-12-06 NOTE — Progress Notes (Signed)
[facility] hospitalist to resume care on 11/5. Dr Woods notified. 

## 2015-12-06 NOTE — Progress Notes (Addendum)
Patient ID: Orson AloeEvon V Marney, female   DOB: 19-May-1945, 70 y.o.   MRN: [id number]  [facility] Progress Note   Assessment/ Plan:   1. AKI: (baseline 1.0-1.2) appears to be from volume depletion (GI losses) with hypotension in the face of ARB. UA results reviewed and will be repeated to track WBCs/RBCs. Proteinuric likely from DM. Underwent emergent HD overnight for uremia and critical hyperkalemia. Poor UOP (oliguric) overnight. Continue IVFs and monitor labs today to assess for need to intervene. Remains lethargic and with some myoclonic jerking.  2. Hyperkalemia: improved with HD and initial temporizing measures, bradycardia improved/resolved. 3. Altered mental status: consistent with uremic encephalopathy-  Monitor s/p HD and decide on need for further HD 4. Anemia: acute hemoglobin drop noted this AM- hemoglobin recheck pending. No overt loss. 5. S/p bilateral L3/L4 decompressive laminectomy and foraminotomy: seen this morning by neurosurgery and repeat imaging pending.  Subjective:   Underwent emergent HD last night. Somnolent this morning and denies any chest pain or shortness of breath.   Objective:   BP (!) 98/48   Pulse 76   Temp 98 F (36.7 C) (Oral)   Resp 12   Ht 5\' 3"  (1.6 m)   Wt 107 kg (235 lb 14.3 oz)   SpO2 100%   BMI 41.79 kg/m   Intake/Output Summary (Last 24 hours) at 12/06/15 0734 Last data filed at 12/06/15 0600  Gross per 24 hour  Intake           741.25 ml  Output              125 ml  Net           616.25 ml   Weight change:   Physical Exam: WUJ:WJXBJYNWGGen:somnolent- awakens to conversation/exam. Some myoclonic jerking of arms noted. NFA:OZHYQCVS:pulse regular rhythm, normal s1 and s2 Resp:Coarse breath sounds bilaterally, no rales/rhonchi MVH:QIONAbd:soft, obese, NT Ext:No edema noted  Imaging: Dg Chest Port 1 View  Result Date: 12/06/2015 CLINICAL DATA:  Central line placement.  Initial encounter. EXAM: PORTABLE CHEST 1 VIEW COMPARISON:  Chest radiograph  performed 12/05/2015 FINDINGS: The patient's right-sided dual-lumen catheter is noted ending at the right cavoatrial junction. Mild vascular congestion is noted. Mild bibasilar atelectasis is seen. No pleural effusion or pneumothorax identified. The cardiomediastinal silhouette is mildly enlarged. No acute osseous abnormalities are identified. IMPRESSION: 1. Right-sided dual-lumen catheter noted ending about the right cavoatrial junction. 2. Mild vascular congestion and mild cardiomegaly. Mild bibasilar atelectasis seen. Electronically Signed   By: Roanna RaiderJeffery  Chang M.D.   On: 12/06/2015 02:37   Dg Chest Port 1 View  Result Date: 12/05/2015 CLINICAL DATA:  Unresponsive, hypotensive, bradycardia, history asthma, hypertension, diabetes mellitus EXAM: PORTABLE CHEST 1 VIEW COMPARISON:  Portable exam 1451 hours compared to 11/24/2015 FINDINGS: Enlargement of cardiac silhouette. Atherosclerotic calcification aorta. Mediastinal contours and pulmonary vascularity normal. Mild RIGHT basilar atelectasis. Lungs otherwise clear. No pleural effusion or pneumothorax. IMPRESSION: Enlargement of cardiac silhouette with mild RIGHT basilar atelectasis. Aortic atherosclerosis. Electronically Signed   By: Ulyses SouthwardMark  Boles M.D.   On: 12/05/2015 15:49    Labs: BMET  Recent Labs Lab 12/05/15 1454 12/05/15 1500 12/05/15 1733 12/05/15 2342 12/06/15 0500  NA 123* 128*  --  126* 132*  K 8.3* 8.4* 7.1* >7.5* 5.3*  CL 100* 98*  --  99* 99*  CO2  --  18*  --  14* 24  GLUCOSE 83 82  --  87 103*  BUN 109* 100*  --  101* 53*  CREATININE 10.00* 8.70*  --  9.01* 5.58*  CALCIUM  --  8.6*  --  9.1 8.2*  PHOS  --   --   --  7.7* 5.2*   CBC  Recent Labs Lab 12/05/15 1454 12/05/15 1500 12/06/15 0500  WBC  --  10.6* 15.8*  NEUTROABS  --  7.9*  --   HGB 8.8* 9.2* 5.7*  HCT 26.0* 28.5* 17.7*  MCV  --  88.0 86.8  PLT  --  277 303    Medications:    . atropine      . insulin aspart  0-9 Units Subcutaneous Q4H  .  piperacillin-tazobactam (ZOSYN)  IV  2.25 g Intravenous Q8H  . vancomycin  1,000 mg Intravenous NOW   Zetta BillsJay Omari Koslosky, MD 12/06/2015, 7:34 AM

## 2015-12-06 NOTE — Progress Notes (Signed)
CRITICAL VALUE ALERT  Critical value received:  Trop 0.13  Date of notification:  11/4  Time of notification:  0255  Critical value read back:Yes.    Nurse who received alert:  Fuller CanadaAmy Sherlyne Crownover  MD notified (1st page):  Marinda ElkGanof, resident  Time of first page:  [phone]  MD notified (2nd page):  Time of second page:  Responding MD:  ganof  Time MD responded:  [phone]

## 2015-12-06 NOTE — Progress Notes (Signed)
CRITICAL VALUE ALERT  Critical value received:  hgb 5.7  Date of notification:  11/4  Time of notification:  0655  Critical value read back:Yes.    Nurse who received alert:  [place]  MD notified (1st page):  nestor  Time of first page:  [phone]  MD notified (2nd page):  Time of second page:  Responding MD:  Jamison Neighbornestor  Time MD responded:  [phone]

## 2015-12-06 NOTE — H&P (Signed)
PULMONARY / CRITICAL CARE MEDICINE   Name: Heidi Campbell MRN: [id number] DOB: 02-07-1945    ADMISSION DATE:  12/05/2015 CONSULTATION DATE:  12/05/2015  REFERRING MD:  Delano Metzobert Schertz, MD  CHIEF COMPLAINT:  Hyperkalemia and ARF  Brief: 70 yo female had lumbar spine surgery on 11/07/15.  She initially was d/cd to home.  Family reports she was having trouble with standing/walking, and was placed in rehab.  She was reported to have trouble with urine incontinence, but then developed urine retention.  She was also having trouble with constipation.  She was able to move legs.  She developed progressive mental status change.  [facility]  She was found to have acute renal failure, hyperkalemia, and acidosis.  She was given tx for acute correction of hyperkalemia.  She was seen by nephrology.  [facility]  She reports feeling symptomatic improvement after receiving several IV fluid boluses.  Family reports that she also seems more alert.  She had foley catheter placed, and has been making urine.  Somnolent, but wakes up easily.  Moves all extremities. Follows commands.  HR regular.  No wheeze/  Abd soft.  Mild drainage from back wound site.   STUDIES:  11/3-CXR: Enlargement of cardiac silhouette with mild RIGHT basilar atelectasis.  CULTURES: 11/3-blood cultures>> 11/3-Urine culture>>  ANTIBIOTICS: Vanc 11/3>> CTX 11/3>>11/4 Zosyn 11/4>>   LINES/TUBES: PIV in left arm 11/3>> HD cath in rt IJ 11/4>> Foley 11/3>>  SIGNIFICANT EVENTS: [facility], Hyperkalemia (K of 8.5), Uremia (BUN 100) and ARF (Cr 8.7)  Subjective:  Patient awake and alert. Denies problem this morning. Denies chest pain, shortness of breath or body pain  VITAL SIGNS: BP (!) 98/48   Pulse 76   Temp 98 F (36.7 C) (Oral)   Resp 12   Ht 5\' 3"  (1.6 m)   Wt 107 kg (235 lb 14.3 oz)   SpO2 100%   BMI 41.79 kg/m    HEMODYNAMICS:  Not on pressors  VENTILATOR SETTINGS:  On 2L by [place]  INTAKE / OUTPUT: I/O last 3 completed shifts: In: 110 [IV Piggyback:110] Out: -   PHYSICAL EXAMINATION: GEN: awake and responds to questions apprppriately Head: normocephalic and atraumatic  Eyes: without conjunctival injection, sclera anicteric, pupils equal round and reactive to light CVS: RRR, normal s1 and s2, no murmurs, no edema, DP pulses 2+ bilaterally RESP: on 2L by [place]. No increased work of breathing, good air movement bilaterally, no crackles or wheeze.  GI: Bowel sounds present and normal, soft, non-tender,non-distended GU: Foley catheter in place SKIN: Surgical wound on her back NEURO: Awake and responds to questions appropriately. No apparent cranial level deficits. Motor 5/5 in LE's. Patellar reflex 2+ bilaterally  LABS:  BMET  Recent Labs Lab 12/05/15 1454 12/05/15 1500 12/05/15 1733 12/05/15 2342  NA 123* 128*  --  126*  K 8.3* 8.4* 7.1* >7.5*  CL 100* 98*  --  99*  CO2  --  18*  --  14*  BUN 109* 100*  --  101*  CREATININE 10.00* 8.70*  --  9.01*  GLUCOSE 83 82  --  87    Electrolytes  Recent Labs Lab 12/05/15 1500 12/05/15 2342  CALCIUM 8.6* 9.1  MG  --  2.3  PHOS  --  7.7*    CBC  Recent Labs Lab 12/05/15 1454 12/05/15 1500 12/06/15 0500  WBC  --  10.6* 15.8*  HGB 8.8* 9.2* 5.7*  HCT 26.0* 28.5* 17.7*  PLT  --  277 303    Coag's No results for input(s): APTT, INR in the last 168 hours.  Sepsis Markers  Recent Labs Lab 12/05/15 1454 12/05/15 1745 12/05/15 2342 12/05/15 2358  LATICACIDVEN 1.93* 2.58*  --  2.7*  PROCALCITON  --   --  0.74  --     ABG  Recent Labs Lab 12/05/15 1458 12/06/15 0605  PHART 7.264* 7.395  PCO2ART 37.6 38.3  PO2ART 336* 136*    Liver Enzymes  Recent Labs Lab 12/05/15 1500 12/05/15 2342  AST 35  --   ALT 23  --   ALKPHOS 52  --   BILITOT 0.8  --   ALBUMIN 3.3* 2.9*    Cardiac Enzymes  Recent Labs Lab  12/06/15 0042  TROPONINI 0.13*    Glucose  Recent Labs Lab 12/05/15 1434 12/06/15 0044 12/06/15 0330  GLUCAP 83 88 133*     DISCUSSION: Patient with no prior renal disease, now with hypokalemia to 8.5, AKI (sCr 8.7) and uremia (BUN 100). Somnolence likely due to uremia. She is also on pain medications and benzo that can contribute to this. She has a lactic acid of 2.58. Lactic acidosis likely due to metformin versus infectious etiology. White blood cell 10.6.  ASSESSMENT / PLAN:  PULMONARY A:  -Asthma-stable -CXR without infiltrate or edema  P:   -Oxygen as needed to maintain saturation over 92% -Albuterol as needed  CARDIOVASCULAR A:  -HTN: stable.  -?CAD -CXR with cardiomegaly.  -EKG without ischemic changes. Trop 0.13 -Intermittent bradycardia  P:  -Hold home BP  meds -Echocardiogram -IV fluid as needed   RENAL A:   -Hyperkalemia: improving -Hyponatremia: improving  -Uremia: improving -ARF: improving P:   -Serial BMP and lytes -IV NS  GASTROINTESTINAL A:   -Emesis and conistipation at home  P:   -NPO pending MS improvement  -RN Swallow Eval -Pepcid -Miralax after swallow eval or Suppository  HEMATOLOGIC A:   -Anemia: Hgb 9.2 (b/l 11) -Mild leukocytosis   P:  -Serial CBC  INFECTIOUS A:   -Recent spinal surgery. Wound with purulent discharge -UTI-UA dirty -Mild lactic acidosis to 2.7 -Surgical wound -Vanc/CTX 11/3>>11/4 -Vanc/Zosyn 11/4>> P:   -Wound care conslt -Appreciate NS recs (Dr. Guilford ShiGreg Cramer) -MRI lumbar spine -Follow cultures -Continue Vanc/Zosyn  ENDOCRINE A:   -Diabetes P:   -SSI-renal  NEUROLOGIC A:   -Somnolence likely due to uremia vs ?sepsis, medication. Ammonia normal -S/p Narcan P:   -Continue monitoring -CT head if worsening -UDS  FAMILY  - Updates: Son and daughters at bedside  - Daughter Reinaldo Berber(Adonica Muffett phone [phone]) HCPOA. She didn't bring the paper to hospital   Candelaria Stagersaye Natara Monfort, MD.   12/06/2015  6:55 AM PGY-2, [facility]

## 2015-12-06 NOTE — Consult Note (Signed)
Reason for Consult: Fever and lethargy sepsis status post lumbar surgery with wound drainage Referring Physician: Critical-care  Heidi Campbell is an 70 y.o. female.  HPI: Patient is a 70 year old female who underwent L3-4 posterior lumbar interbody fusion back in the beginning of October. Patient went to rehabilitation bounced back last night with a decline in mental status fever hypotension clinical exam consistent with sepsis unknown source but did have some concern for wound and wound drainage from her lumbar surgery. Patient was admitted was hydrated and placed on IV antibiotics and it appears the patient feel significantly better this morning. He currently says her legs feel much better than it did before surgery she denies any new numbness or tingling denies any weakness in her legs.  Past Medical History:  Diagnosis Date  . ALLERGIC RHINITIS   . Arthritis   . ASTHMA   . Asthma   . Diabetes mellitus without complication ([place])   . Esophageal reflux   . Hypertension   . SINUSITIS, ACUTE     Past Surgical History:  Procedure Laterality Date  . ABDOMINAL HYSTERECTOMY    . arthroscopic knee Right 2015  . BACK SURGERY    . BREAST REDUCTION SURGERY Bilateral 2006  . BREAST SURGERY    . HAND SURGERY Left    damaged nerve in hand    Family History  Problem Relation Age of Onset  . Diabetes Mother   . Heart attack Father   . Heart disease Father     Social History:  reports that she has never smoked. She has never used smokeless tobacco. She reports that she does not drink alcohol or use drugs.  Allergies:  Allergies  Allergen Reactions  . Contrast Media [Iodinated Diagnostic Agents]   . Latex     Unknown reaction per MAR   . Ace Inhibitors Cough  . Shellfish Allergy Other (See Comments)    Pt cannot recall at this time    Medications: I have reviewed the patient's current medications.  Results for orders placed or performed during the hospital encounter of 12/05/15  (from the past 48 hour(s))  CBG monitoring, ED     Status: None   Collection Time: 12/05/15  2:34 PM  Result Value Ref Range   Glucose-Capillary 83 65 - 99 mg/dL  Urinalysis, Routine w reflex microscopic (not at [facility])     Status: Abnormal   Collection Time: 12/05/15  2:48 PM  Result Value Ref Range   Color, Urine YELLOW YELLOW   APPearance TURBID (A) CLEAR   Specific Gravity, Urine 1.021 1.005 - 1.030   pH 5.5 5.0 - 8.0   Glucose, UA NEGATIVE NEGATIVE mg/dL   Hgb urine dipstick SMALL (A) NEGATIVE   Bilirubin Urine NEGATIVE NEGATIVE   Ketones, ur NEGATIVE NEGATIVE mg/dL   Protein, ur 100 (A) NEGATIVE mg/dL   Nitrite NEGATIVE NEGATIVE   Leukocytes, UA LARGE (A) NEGATIVE  Urine microscopic-add on     Status: Abnormal   Collection Time: 12/05/15  2:48 PM  Result Value Ref Range   Squamous Epithelial / LPF 0-5 (A) NONE SEEN   WBC, UA TOO NUMEROUS TO COUNT 0 - 5 WBC/hpf   RBC / HPF 6-30 0 - 5 RBC/hpf   Bacteria, UA MANY (A) NONE SEEN  I-Stat CG4 Lactic Acid, ED  (not at  [facility])     Status: Abnormal   Collection Time: 12/05/15  2:54 PM  Result Value Ref Range   Lactic Acid, Venous 1.93 (HH) 0.5 -  1.9 mmol/L  I-Stat Chem 8, ED     Status: Abnormal   Collection Time: 12/05/15  2:54 PM  Result Value Ref Range   Sodium 123 (L) 135 - 145 mmol/L   Potassium 8.3 (HH) 3.5 - 5.1 mmol/L   Chloride 100 (L) 101 - 111 mmol/L   BUN 109 (H) 6 - 20 mg/dL   Creatinine, Ser 10.00 (H) 0.44 - 1.00 mg/dL   Glucose, Bld 83 65 - 99 mg/dL   Calcium, Ion 1.00 (L) 1.15 - 1.40 mmol/L   TCO2 17 0 - 100 mmol/L   Hemoglobin 8.8 (L) 12.0 - 15.0 g/dL   HCT 26.0 (L) 36.0 - 46.0 %   Comment NOTIFIED PHYSICIAN   Blood gas, arterial     Status: Abnormal   Collection Time: 12/05/15  2:58 PM  Result Value Ref Range   FIO2 100.00    Delivery systems NON-REBREATHER OXYGEN MASK    pH, Arterial 7.264 (L) 7.350 - 7.450   pCO2 arterial 37.6 32.0 - 48.0 mmHg   pO2, Arterial 336 (H) 83.0 - 108.0 mmHg   Bicarbonate  16.5 (L) 20.0 - 28.0 mmol/L   Acid-base deficit 9.3 (H) 0.0 - 2.0 mmol/L   O2 Saturation 99.1 %   Patient temperature 98.6    Collection site BRACHIAL ARTERY    Drawn by [id number]    Sample type ARTERIAL DRAW    Allens test (pass/fail) PASS PASS  Comprehensive metabolic panel     Status: Abnormal   Collection Time: 12/05/15  3:00 PM  Result Value Ref Range   Sodium 128 (L) 135 - 145 mmol/L   Potassium 8.4 (HH) 3.5 - 5.1 mmol/L    Comment: RESULT CHECKED CRITICAL RESULT CALLED TO, READ BACK BY AND VERIFIED WITH: K.INGLE RN [id number] A.QUIZON NO VISIBLE HEMOLYSIS    Chloride 98 (L) 101 - 111 mmol/L   CO2 18 (L) 22 - 32 mmol/L   Glucose, Bld 82 65 - 99 mg/dL   BUN 100 (H) 6 - 20 mg/dL   Creatinine, Ser 8.70 (H) 0.44 - 1.00 mg/dL   Calcium 8.6 (L) 8.9 - 10.3 mg/dL   Total Protein 7.1 6.5 - 8.1 g/dL   Albumin 3.3 (L) 3.5 - 5.0 g/dL   AST 35 15 - 41 U/L   ALT 23 14 - 54 U/L   Alkaline Phosphatase 52 38 - 126 U/L   Total Bilirubin 0.8 0.3 - 1.2 mg/dL   GFR calc non Af Amer 4 (L) >60 mL/min   GFR calc Af Amer 5 (L) >60 mL/min    Comment: (NOTE) The eGFR has been calculated using the CKD EPI equation. This calculation has not been validated in all clinical situations. eGFR's persistently <60 mL/min signify possible Chronic Kidney Disease.    Anion gap 12 5 - 15  CBC WITH DIFFERENTIAL     Status: Abnormal   Collection Time: 12/05/15  3:00 PM  Result Value Ref Range   WBC 10.6 (H) 4.0 - 10.5 K/uL   RBC 3.24 (L) 3.87 - 5.11 MIL/uL   Hemoglobin 9.2 (L) 12.0 - 15.0 g/dL   HCT 28.5 (L) 36.0 - 46.0 %   MCV 88.0 78.0 - 100.0 fL   MCH 28.4 26.0 - 34.0 pg   MCHC 32.3 30.0 - 36.0 g/dL   RDW 13.2 11.5 - 15.5 %   Platelets 277 150 - 400 K/uL   Neutrophils Relative % 75 %   Neutro Abs 7.9 (H) 1.7 - 7.7 K/uL  Lymphocytes Relative 13 %   Lymphs Abs 1.4 0.7 - 4.0 K/uL   Monocytes Relative 12 %   Monocytes Absolute 1.3 (H) 0.1 - 1.0 K/uL   Eosinophils Relative 0 %   Eosinophils Absolute  0.0 0.0 - 0.7 K/uL   Basophils Relative 0 %   Basophils Absolute 0.0 0.0 - 0.1 K/uL  I-stat troponin, ED     Status: None   Collection Time: 12/05/15  3:13 PM  Result Value Ref Range   Troponin i, poc 0.05 0.00 - 0.08 ng/mL   Comment 3            Comment: Due to the release kinetics of cTnI, a negative result within the first hours of the onset of symptoms does not rule out myocardial infarction with certainty. If myocardial infarction is still suspected, repeat the test at appropriate intervals.   Blood Culture (routine x 2)     Status: None (Preliminary result)   Collection Time: 12/05/15  5:00 PM  Result Value Ref Range   Specimen Description      BLOOD LEFT FOREARM Performed at [facility]    Special Requests BOTTLES DRAWN AEROBIC AND ANAEROBIC [facility]    Culture PENDING    Report Status PENDING   Ammonia     Status: None   Collection Time: 12/05/15  5:33 PM  Result Value Ref Range   Ammonia 15 9 - 35 umol/L  Potassium     Status: Abnormal   Collection Time: 12/05/15  5:33 PM  Result Value Ref Range   Potassium 7.1 (HH) 3.5 - 5.1 mmol/L    Comment: CRITICAL RESULT CALLED TO, READ BACK BY AND VERIFIED WITH: JAKE,ED AT 9735 ON 12/05/15 BY MOSLEY,J   I-Stat CG4 Lactic Acid, ED     Status: Abnormal   Collection Time: 12/05/15  5:45 PM  Result Value Ref Range   Lactic Acid, Venous 2.58 (HH) 0.5 - 1.9 mmol/L   Comment NOTIFIED PHYSICIAN   MRSA PCR Screening     Status: None   Collection Time: 12/05/15  8:10 PM  Result Value Ref Range   MRSA by PCR NEGATIVE NEGATIVE    Comment:        The GeneXpert MRSA Assay (FDA approved for NASAL specimens only), is one component of a comprehensive MRSA colonization surveillance program. It is not intended to diagnose MRSA infection nor to guide or monitor treatment for MRSA infections.   Renal function panel     Status: Abnormal   Collection Time: 12/05/15 11:42 PM  Result Value Ref Range   Sodium 126 (L) 135 - 145  mmol/L   Potassium >7.5 (HH) 3.5 - 5.1 mmol/L    Comment: CRITICAL RESULT CALLED TO, READ BACK BY AND VERIFIED WITH: KEATON,A RN 11/04/20170107 JORDANS NO VISIBLE HEMOLYSIS    Chloride 99 (L) 101 - 111 mmol/L   CO2 14 (L) 22 - 32 mmol/L   Glucose, Bld 87 65 - 99 mg/dL   BUN 101 (H) 6 - 20 mg/dL   Creatinine, Ser 9.01 (H) 0.44 - 1.00 mg/dL   Calcium 9.1 8.9 - 10.3 mg/dL   Phosphorus 7.7 (H) 2.5 - 4.6 mg/dL   Albumin 2.9 (L) 3.5 - 5.0 g/dL   GFR calc non Af Amer 4 (L) >60 mL/min   GFR calc Af Amer 5 (L) >60 mL/min    Comment: (NOTE) The eGFR has been calculated using the CKD EPI equation. This calculation has not been validated in all clinical situations. eGFR's  persistently <60 mL/min signify possible Chronic Kidney Disease.    Anion gap 13 5 - 15  Magnesium     Status: None   Collection Time: 12/05/15 11:42 PM  Result Value Ref Range   Magnesium 2.3 1.7 - 2.4 mg/dL  Procalcitonin - Baseline     Status: None   Collection Time: 12/05/15 11:42 PM  Result Value Ref Range   Procalcitonin 0.74 ng/mL    Comment:        Interpretation: PCT > 0.5 ng/mL and <= 2 ng/mL: Systemic infection (sepsis) is possible, but other conditions are known to elevate PCT as well. (NOTE)         ICU PCT Algorithm               Non ICU PCT Algorithm    ----------------------------     ------------------------------         PCT < 0.25 ng/mL                 PCT < 0.1 ng/mL     Stopping of antibiotics            Stopping of antibiotics       strongly encouraged.               strongly encouraged.    ----------------------------     ------------------------------       PCT level decrease by               PCT < 0.25 ng/mL       >= 80% from peak PCT       OR PCT 0.25 - 0.5 ng/mL          Stopping of antibiotics                                             encouraged.     Stopping of antibiotics           encouraged.    ----------------------------     ------------------------------       PCT level  decrease by              PCT >= 0.25 ng/mL       < 80% from peak PCT        AND PCT >= 0.5 ng/mL             Continuing antibiotics                                              encouraged.       Continuing antibiotics            encouraged.    ----------------------------     ------------------------------     PCT level increase compared          PCT > 0.5 ng/mL         with peak PCT AND          PCT >= 0.5 ng/mL             Escalation of antibiotics  strongly encouraged.      Escalation of antibiotics        strongly encouraged.   CK     Status: Abnormal   Collection Time: 12/05/15 11:42 PM  Result Value Ref Range   Total CK 1,501 (H) 38 - 234 U/L  Lactic acid, plasma     Status: Abnormal   Collection Time: 12/05/15 11:58 PM  Result Value Ref Range   Lactic Acid, Venous 2.7 (HH) 0.5 - 1.9 mmol/L    Comment: CRITICAL RESULT CALLED TO, READ BACK BY AND VERIFIED WITH: KEATON,A RN 11/04/20170107 JORDANS   Troponin I     Status: Abnormal   Collection Time: 12/06/15 12:42 AM  Result Value Ref Range   Troponin I 0.13 (HH) <0.03 ng/mL    Comment: CRITICAL RESULT CALLED TO, READ BACK BY AND VERIFIED WITH: KEATON,A RN 12/06/2015 0246 JORDANS   Brain natriuretic peptide     Status: Abnormal   Collection Time: 12/06/15 12:42 AM  Result Value Ref Range   B Natriuretic Peptide 187.8 (H) 0.0 - 100.0 pg/mL  Glucose, capillary     Status: None   Collection Time: 12/06/15 12:44 AM  Result Value Ref Range   Glucose-Capillary 88 65 - 99 mg/dL   Comment 1 Notify RN    Comment 2 Document in Chart   Glucose, capillary     Status: Abnormal   Collection Time: 12/06/15  3:30 AM  Result Value Ref Range   Glucose-Capillary 133 (H) 65 - 99 mg/dL   Comment 1 Notify RN    Comment 2 Document in Chart   Lactic acid, plasma     Status: None   Collection Time: 12/06/15  4:34 AM  Result Value Ref Range   Lactic Acid, Venous 1.4 0.5 - 1.9 mmol/L  Renal  function panel     Status: Abnormal   Collection Time: 12/06/15  5:00 AM  Result Value Ref Range   Sodium 132 (L) 135 - 145 mmol/L   Potassium 5.3 (H) 3.5 - 5.1 mmol/L    Comment: DELTA CHECK NOTED   Chloride 99 (L) 101 - 111 mmol/L   CO2 24 22 - 32 mmol/L   Glucose, Bld 103 (H) 65 - 99 mg/dL   BUN 53 (H) 6 - 20 mg/dL   Creatinine, Ser 5.58 (H) 0.44 - 1.00 mg/dL    Comment: DELTA CHECK NOTED   Calcium 8.2 (L) 8.9 - 10.3 mg/dL   Phosphorus 5.2 (H) 2.5 - 4.6 mg/dL   Albumin 2.5 (L) 3.5 - 5.0 g/dL   GFR calc non Af Amer 7 (L) >60 mL/min   GFR calc Af Amer 8 (L) >60 mL/min    Comment: (NOTE) The eGFR has been calculated using the CKD EPI equation. This calculation has not been validated in all clinical situations. eGFR's persistently <60 mL/min signify possible Chronic Kidney Disease.    Anion gap 9 5 - 15  Magnesium     Status: None   Collection Time: 12/06/15  5:00 AM  Result Value Ref Range   Magnesium 2.2 1.7 - 2.4 mg/dL  Procalcitonin     Status: None   Collection Time: 12/06/15  5:00 AM  Result Value Ref Range   Procalcitonin 0.82 ng/mL    Comment:        Interpretation: PCT > 0.5 ng/mL and <= 2 ng/mL: Systemic infection (sepsis) is possible, but other conditions are known to elevate PCT as well. (NOTE)         ICU  PCT Algorithm               Non ICU PCT Algorithm    ----------------------------     ------------------------------         PCT < 0.25 ng/mL                 PCT < 0.1 ng/mL     Stopping of antibiotics            Stopping of antibiotics       strongly encouraged.               strongly encouraged.    ----------------------------     ------------------------------       PCT level decrease by               PCT < 0.25 ng/mL       >= 80% from peak PCT       OR PCT 0.25 - 0.5 ng/mL          Stopping of antibiotics                                             encouraged.     Stopping of antibiotics           encouraged.    ----------------------------      ------------------------------       PCT level decrease by              PCT >= 0.25 ng/mL       < 80% from peak PCT        AND PCT >= 0.5 ng/mL             Continuing antibiotics                                              encouraged.       Continuing antibiotics            encouraged.    ----------------------------     ------------------------------     PCT level increase compared          PCT > 0.5 ng/mL         with peak PCT AND          PCT >= 0.5 ng/mL             Escalation of antibiotics                                          strongly encouraged.      Escalation of antibiotics        strongly encouraged.   CBC     Status: Abnormal   Collection Time: 12/06/15  5:00 AM  Result Value Ref Range   WBC 15.8 (H) 4.0 - 10.5 K/uL    Comment: QUESTIONABLE RESULTS, RECOMMEND RECOLLECT TO VERIFY   RBC 2.04 (L) 3.87 - 5.11 MIL/uL    Comment: QUESTIONABLE RESULTS, RECOMMEND RECOLLECT TO VERIFY   Hemoglobin 5.7 (LL) 12.0 - 15.0 g/dL    Comment: REPEATED TO VERIFY CRITICAL RESULT CALLED TO, READ BACK BY AND VERIFIED WITH: A.KORSLIEN,RN [id number] 12/06/15  G.[id number] QUESTIONABLE RESULTS, RECOMMEND RECOLLECT TO VERIFY    HCT 17.7 (L) 36.0 - 46.0 %    Comment: QUESTIONABLE RESULTS, RECOMMEND RECOLLECT TO VERIFY   MCV 86.8 78.0 - 100.0 fL    Comment: QUESTIONABLE RESULTS, RECOMMEND RECOLLECT TO VERIFY   MCH 27.9 26.0 - 34.0 pg    Comment: QUESTIONABLE RESULTS, RECOMMEND RECOLLECT TO VERIFY   MCHC 32.2 30.0 - 36.0 g/dL    Comment: QUESTIONABLE RESULTS, RECOMMEND RECOLLECT TO VERIFY   RDW 13.4 11.5 - 15.5 %    Comment: QUESTIONABLE RESULTS, RECOMMEND RECOLLECT TO VERIFY   Platelets 303 150 - 400 K/uL    Comment: QUESTIONABLE RESULTS, RECOMMEND RECOLLECT TO VERIFY  Blood gas, arterial     Status: Abnormal   Collection Time: 12/06/15  6:05 AM  Result Value Ref Range   O2 Content 2.0 L/min   Delivery systems NASAL CANNULA    pH, Arterial 7.395 7.350 - 7.450   pCO2 arterial 38.3 32.0 - 48.0  mmHg   pO2, Arterial 136 (H) 83.0 - 108.0 mmHg   Bicarbonate 23.2 20.0 - 28.0 mmol/L   Acid-base deficit 1.1 0.0 - 2.0 mmol/L   O2 Saturation 98.3 %   Patient temperature 97.5    Collection site RIGHT RADIAL    Drawn by [id number]    Sample type ARTERIAL DRAW    Allens test (pass/fail) PASS PASS  CBC     Status: Abnormal   Collection Time: 12/06/15  7:30 AM  Result Value Ref Range   WBC 12.7 (H) 4.0 - 10.5 K/uL   RBC 2.98 (L) 3.87 - 5.11 MIL/uL   Hemoglobin 8.2 (L) 12.0 - 15.0 g/dL    Comment: REPEATED TO VERIFY DELTA CHECK NOTED    HCT 25.7 (L) 36.0 - 46.0 %   MCV 86.2 78.0 - 100.0 fL   MCH 27.5 26.0 - 34.0 pg   MCHC 31.9 30.0 - 36.0 g/dL   RDW 13.1 11.5 - 15.5 %   Platelets 261 150 - 400 K/uL    Dg Chest Port 1 View  Result Date: 12/06/2015 CLINICAL DATA:  Central line placement.  Initial encounter. EXAM: PORTABLE CHEST 1 VIEW COMPARISON:  Chest radiograph performed 12/05/2015 FINDINGS: The patient's right-sided dual-lumen catheter is noted ending at the right cavoatrial junction. Mild vascular congestion is noted. Mild bibasilar atelectasis is seen. No pleural effusion or pneumothorax identified. The cardiomediastinal silhouette is mildly enlarged. No acute osseous abnormalities are identified. IMPRESSION: 1. Right-sided dual-lumen catheter noted ending about the right cavoatrial junction. 2. Mild vascular congestion and mild cardiomegaly. Mild bibasilar atelectasis seen. Electronically Signed   By: Garald Balding M.D.   On: 12/06/2015 02:37   Dg Chest Port 1 View  Result Date: 12/05/2015 CLINICAL DATA:  Unresponsive, hypotensive, bradycardia, history asthma, hypertension, diabetes mellitus EXAM: PORTABLE CHEST 1 VIEW COMPARISON:  Portable exam 1451 hours compared to 11/24/2015 FINDINGS: Enlargement of cardiac silhouette. Atherosclerotic calcification aorta. Mediastinal contours and pulmonary vascularity normal. Mild RIGHT basilar atelectasis. Lungs otherwise clear. No pleural  effusion or pneumothorax. IMPRESSION: Enlargement of cardiac silhouette with mild RIGHT basilar atelectasis. Aortic atherosclerosis. Electronically Signed   By: Lavonia Dana M.D.   On: 12/05/2015 15:49    Review of Systems  Constitutional: Positive for chills and fever.  Musculoskeletal: Positive for myalgias.  Neurological: Positive for dizziness and weakness.   Blood pressure (!) 98/48, pulse 76, temperature 98 F (36.7 C), temperature source Oral, resp. rate 12, height '5\' 3"'$  (1.6 m), weight 107 kg (235 lb 14.3  oz), SpO2 100 %. Physical Exam  Constitutional: She appears well-developed.  HENT:  Head: Normocephalic.  Eyes: Pupils are equal, round, and reactive to light.  Neck: Normal range of motion.  Neurological: She is alert. She has normal strength. GCS eye subscore is 4. GCS verbal subscore is 5. GCS motor subscore is 6.  Patient is awake alert moves all extremities seems to have at least 4+ to 5 out of 5 strength in her lower extremities. Wound shows mild dehiscence with granulation tissue no significant drainage on the current dressing.    Assessment/Plan: 70 year old female with clinical exam consistent with sepsis high white count and fever, uremia renal failure,  and hypotension only 4 weeks out from lumbar fusion recommend MRI scan of her lumbar spine to evaluate for concerning infection. Clinically patient seems to be improving on IV antibiotics and with fluid resuscitation. Recommend continuing IV antibiotics obtain an MRI scan of her lumbar spine will not be able to use contrast secondary to her renal failure and possible history of contrast allergy. The patient seems to be neurologically returning back to her baseline.   [place] Sloop P 12/06/2015, 8:18 AM

## 2015-12-06 NOTE — Progress Notes (Signed)
CRITICAL VALUE ALERT  Critical value received:  K+ >7.5, Lactic Acid 2.7  Date of notification:  10/4  Time of notification:  0107  Critical value read back:Yes.    Nurse who received alert:  Fuller CanadaAmy Abdirahim Flavell RN  MD notified (1st page):  Rolland BimlerSood Md, verbal  Time of first page:  0107  MD notified (2nd page):  Time of second page:  Responding MD:  Craige CottaSood MD  Time MD responded:  [phone]

## 2015-12-06 NOTE — Progress Notes (Signed)
Initial Nutrition Assessment  DOCUMENTATION CODES:  Morbid obesity  INTERVENTION:  Monitor PO intake as diet is advanced and reassess need for snacks, supplements, education as warranted  NUTRITION DIAGNOSIS:  Increased nutrient needs related to wound healing, acute illness (AKI) as evidenced by estimated nutritional requirements for these conditions.   GOAL:  Patient will meet greater than or equal to 90% of their needs  MONITOR:  PO intake, Supplement acceptance, Diet advancement, Weight trends, Labs  REASON FOR ASSESSMENT:  Malnutrition Screening Tool    ASSESSMENT:  70 y/o female with PMHx DM, GERD, HTN, and recent Lumbar surgery. Was at rehab facility when found to be lethargic, having episodes of emesis, poor Po intake, decreased UOP and disorientation. Presented to ED and worked up for hyperkalemia and ARF s/p emergent dialysis.   Pt asleep on RD arrival. Family member reports that pt has only gone ~24 hours w/o PO intake. Symptoms of N/V/D were intermittent. At baseline, did not take any vitamins/minerals. She did drink Ensure.   Wt wise, family member does not report any recent weight changes. UBW range appears to be 225-235 lbs.   Pt currently NPO due to lethargy, however RN reports this has improved greatly in the past 24 hours. Likely will not require nutrition support.   Will monitor PO intake as oral intake resumes. Will hold off ordering Ensure until K/phos improved  NFPE: No muscle or fat wasting.   Labs: Phos/K/renal labs all improved. BG: 103, Hyponatremia improving.  Medications: Insulin, Zosyn, Bicarb in dextrose D5   Recent Labs Lab 12/05/15 1500 12/05/15 1733 12/05/15 2342 12/06/15 0500  NA 128*  --  126* 132*  K 8.4* 7.1* >7.5* 5.3*  CL 98*  --  99* 99*  CO2 18*  --  14* 24  BUN 100*  --  101* 53*  CREATININE 8.70*  --  9.01* 5.58*  CALCIUM 8.6*  --  9.1 8.2*  MG  --   --  2.3 2.2  PHOS  --   --  7.7* 5.2*  GLUCOSE 82  --  87 103*    Diet  Order:  Diet NPO time specified  Skin:  Stage 2 PU to sacrum  Last BM:  PTA  Height:  Ht Readings from Last 1 Encounters:  12/06/15 5\' 3"  (1.6 m)   Weight:  Wt Readings from Last 1 Encounters:  12/06/15 235 lb 14.3 oz (107 kg)   Wt Readings from Last 10 Encounters:  12/06/15 235 lb 14.3 oz (107 kg)  11/24/15 235 lb (106.6 kg)  11/22/15 235 lb (106.6 kg)  11/07/15 231 lb (104.8 kg)  10/30/15 231 lb 9.6 oz (105.1 kg)  05/27/15 228 lb 12.8 oz (103.8 kg)  04/28/15 230 lb 9.6 oz (104.6 kg)  03/25/15 232 lb 3.2 oz (105.3 kg)  12/12/14 226 lb (102.5 kg)  11/14/14 227 lb (103 kg)   Ideal Body Weight:  52.27 kg  BMI:  Body mass index is 41.79 kg/m.  Estimated Nutritional Needs:  Kcal:  1500-1700 kcals (15-17 kcal/kg bw) Protein:  68-78 g Pro (1.3-1.5 g/kg ibw) Fluid:  Per MD  EDUCATION NEEDS:  No education needs identified at this time  Christophe LouisNathan Keona Bilyeu RD, LDN, CNSC Clinical Nutrition Pager: [phone] 12/06/2015 11:51 AM

## 2015-12-06 NOTE — Progress Notes (Signed)
eLink Physician-Brief Progress Note Patient Name: Heidi Campbell DOB: 05/16/1945 MRN: [id number]   Date of Service  12/06/2015  HPI/Events of Note  Request for diet. Patient now more awake, alert and follows commands. Able to handle sips of water without issues. Hx of DM.  eICU Interventions  Will order: 1. Carb modified clear liquid diet.      Intervention Category Minor Interventions: Routine modifications to care plan (e.g. PRN medications for pain, fever)  Heidi Campbell 12/06/2015, 7:10 PM

## 2015-12-06 NOTE — Consult Note (Signed)
WOC Nurse wound consult note Reason for Consult: intertriginous dermatitis in the intragluteal cleft and sub pannicular skin fold (intragluteal cleft area has resulted in a partial thickness skin loss), chronic, non-healing wound of the mid-thoracic region.  Wound type: Moisture associated skin damage, specifically intertriginous dermatitis (ITD), surgical Pressure Ulcer POA: No Measurement:Mid-thoracic spine:  4.5cm x 0.4cm x 0.2cm depth, 75% red, moist, 25% yellow slough. Intragluteal lesion:  1.5cm x 0.4cm x 0.2cm, red, moist Wound bed:As described above Drainage (amount, consistency, odor) Scant serous Periwound:Intact, dry Dressing procedure/placement/frequency: I have provided Nursing with guidance via the Orders for care of the intragluteal skin fold lesion and the sub pannicular area using our house antimicrobial textile.  The mid-thoracic area will require twice daily dressing changes using saline moistened gauze.  Regular turning and repositioning is in place and patient is on a mattress replacement with low air loss feature, but I will still provide pressure redistribution heel boots for pressure injury prevention in a high risk patient. WOC nursing team will not follow, but will remain available to this patient, the nursing and medical teams.  Please re-consult if needed. Thanks, Ladona MowLaurie Rambo Sarafian, MSN, RN, GNP, Hans EdenCWOCN, CWON-AP, FAAN  Pager# [phone]

## 2015-12-07 DIAGNOSIS — Z794 Long term (current) use of insulin: Secondary | ICD-10-CM

## 2015-12-07 DIAGNOSIS — N170 Acute kidney failure with tubular necrosis: Secondary | ICD-10-CM

## 2015-12-07 DIAGNOSIS — E119 Type 2 diabetes mellitus without complications: Secondary | ICD-10-CM

## 2015-12-07 DIAGNOSIS — E785 Hyperlipidemia, unspecified: Secondary | ICD-10-CM

## 2015-12-07 DIAGNOSIS — E669 Obesity, unspecified: Secondary | ICD-10-CM

## 2015-12-07 LAB — URINE MICROSCOPIC-ADD ON

## 2015-12-07 LAB — RENAL FUNCTION PANEL
Albumin: 2.3 g/dL — ABNORMAL LOW (ref 3.5–5.0)
Anion gap: 10 (ref 5–15)
BUN: 57 mg/dL — AB (ref 6–20)
CHLORIDE: 98 mmol/L — AB (ref 101–111)
CO2: 27 mmol/L (ref 22–32)
CREATININE: 6.24 mg/dL — AB (ref 0.44–1.00)
Calcium: 8 mg/dL — ABNORMAL LOW (ref 8.9–10.3)
GFR calc Af Amer: 7 mL/min — ABNORMAL LOW (ref >60)
GFR, EST NON AFRICAN AMERICAN: 6 mL/min — AB (ref >60)
GLUCOSE: 76 mg/dL (ref 65–99)
Phosphorus: 5.8 mg/dL — ABNORMAL HIGH (ref 2.5–4.6)
Potassium: 5.2 mmol/L — ABNORMAL HIGH (ref 3.5–5.1)
Sodium: 135 mmol/L (ref 135–145)

## 2015-12-07 LAB — URINE CULTURE: Culture: 40000 — AB

## 2015-12-07 LAB — URINALYSIS, ROUTINE W REFLEX MICROSCOPIC
Bilirubin Urine: NEGATIVE
GLUCOSE, UA: NEGATIVE mg/dL
KETONES UR: NEGATIVE mg/dL
NITRITE: NEGATIVE
PROTEIN: 100 mg/dL — AB
Specific Gravity, Urine: 1.013 (ref 1.005–1.030)
pH: 6 (ref 5.0–8.0)

## 2015-12-07 LAB — HEPATITIS B CORE ANTIBODY, TOTAL: HEP B C TOTAL AB: NEGATIVE

## 2015-12-07 LAB — GLUCOSE, CAPILLARY
GLUCOSE-CAPILLARY: 107 mg/dL — AB (ref 65–99)
GLUCOSE-CAPILLARY: 98 mg/dL (ref 65–99)
GLUCOSE-CAPILLARY: 219 mg/dL — AB (ref 65–99)
Glucose-Capillary: 73 mg/dL (ref 65–99)
Glucose-Capillary: 123 mg/dL — ABNORMAL HIGH (ref 65–99)
Glucose-Capillary: 205 mg/dL — ABNORMAL HIGH (ref 65–99)
Glucose-Capillary: 178 mg/dL — ABNORMAL HIGH (ref 65–99)

## 2015-12-07 LAB — HEPATITIS B SURFACE ANTIGEN: Hepatitis B Surface Ag: NEGATIVE

## 2015-12-07 LAB — PROCALCITONIN: Procalcitonin: 0.81 ng/mL

## 2015-12-07 LAB — HEPATITIS B SURFACE ANTIBODY,QUALITATIVE: Hep B S Ab: NONREACTIVE

## 2015-12-07 LAB — MAGNESIUM: Magnesium: 2 mg/dL (ref 1.7–2.4)

## 2015-12-07 MED ORDER — INSULIN GLARGINE 100 UNIT/ML ~~LOC~~ SOLN
10.0000 [IU] | Freq: Every day | SUBCUTANEOUS | Status: DC
  Administered 2015-12-07 – 2015-12-12 (×6): 10 [IU] via SUBCUTANEOUS
  Filled 2015-12-07 (×6): qty 0.1

## 2015-12-07 MED ORDER — PATIROMER SORBITEX CALCIUM 8.4 G PO PACK
16.8000 g | PACK | Freq: Once | ORAL | Status: AC
  Administered 2015-12-07: 16.8 g via ORAL
  Filled 2015-12-07: qty 4

## 2015-12-07 MED ORDER — DEXTROSE-NACL 5-0.9 % IV SOLN
INTRAVENOUS | Status: DC
  Administered 2015-12-07 – 2015-12-08 (×2): via INTRAVENOUS

## 2015-12-07 NOTE — Progress Notes (Signed)
Patient ID: Heidi Campbell, female   DOB: Jun 25, 1945, 70 y.o.   MRN: [id number]  [facility] Progress Note   Assessment/ Plan:   1. AKI: (baseline 1.0-1.2) appears to be from volume depletion (GI losses) with hypotension in the face of ARB. She underwent emergency hemodialysis for critical hyperkalemia on Friday night/Saturday morning. Unfortunately, poor urine output noted overnight with some rise of creatinine and mild hyperkalemia. She is 6 L positive on input/output since admission (appropriate for suspected volume depletion). Agree with continuing gentle range of normal saline at 50 mL per hour. Will discontinue sodium bicarbonate now that bicarbonate level is 27. 2. Hyperkalemia: Appears to be noncritical and without associated arrhythmia/telemetry change-will prescribe Veltassa for potassium lowering. 3. Altered mental status: Suspected to have been from metabolic encephalopathy including uremic encephalopathy-  appears to have improved with hemodialysis, will continue to monitor. 4. Anemia: acute hemoglobin drop noted this AM- hemoglobin recheck pending. No overt loss. 5. S/p bilateral L3/L4 decompressive laminectomy and foraminotomy: seen this morning by neurosurgery and repeat imaging pending.  Subjective:   Reports to be feeling somewhat better today--family concerned about her diet. Denies any chest pain or shortness of breath.   Objective:   BP (!) 116/58   Pulse 77   Temp 98.5 F (36.9 C) (Axillary)   Resp 10   Ht 5\' 3"  (1.6 m)   Wt 112.7 kg (248 lb 7.3 oz)   SpO2 100%   BMI 44.01 kg/m   Intake/Output Summary (Last 24 hours) at 12/07/15 0930 Last data filed at 12/07/15 0800  Gross per 24 hour  Intake          5189.17 ml  Output              330 ml  Net          4859.17 ml   Weight change: 5.7 kg (12 lb 9.1 oz)  Physical Exam: Gen: She is intermittently asleep when I saw her, awakens to conversation appropriately and answers questions WUJ:WJXBJCVS:pulse regular  rhythm, normal s1 and s2 Resp: Clear breath sounds bilaterally, no rales/rhonchi Abd: soft, obese, NT Ext: No edema noted  Imaging: Mr Lumbar Spine Wo Contrast  Result Date: 12/06/2015 CLINICAL DATA:  Recent lumbar surgery with sepsis. EXAM: MRI LUMBAR SPINE WITHOUT CONTRAST TECHNIQUE: Multiplanar, multisequence MR imaging of the lumbar spine was performed. No intravenous contrast was administered. COMPARISON:  11/23/2015 FINDINGS: Segmentation: 5 lumbar type vertebral bodies. The last full intervertebral disc space is labeled L5-S1. This is consistent with the prior MRI. Alignment:  Normal Vertebrae: Mild stable endplate reactive changes. No findings suspicious for diskitis or osteomyelitis. Conus medullaris: Extends to the T12-L1. Level and appears normal. Paraspinal and other soft tissues: Stable postop fluid collection both superficial and deep to the muscle fascia. This could be a postoperative seroma or liquified hematoma. The it does not have the appearance of a CSF leak. Disc levels: T12-L1: Stable left-sided disc extrusion coursing both above and below the disc space. Moderate mass effect on the left side of thecal sac is stable. L1-2: No significant findings or interval change. L2-3:  No significant findings or interval change. L3-4: Posterior and interbody fusion changes. Wide decompressive laminectomy. No spinal or foraminal stenosis. Stable postoperative fluid collection as discussed above. L4-5: Stable bulging annulus and advanced facet disease with mild bilateral lateral recess stenosis. No significant spinal or foraminal stenosis. L5-S1: Stable low disc disease and facet disease. No focal disc protrusion, spinal or foraminal stenosis. IMPRESSION: 1.  Stable MR appearance of the lumbar spine since the recent examination from 11/23/2015. Specific findings as discussed above. 2. Stable postoperative fluid collection extending from L2-L4. 3. Stable moderate-sized disc extrusion at T12-L1. 4. No  definite MR findings to suggest diskitis or osteomyelitis. Electronically Signed   By: Rudie MeyerP.  Gallerani M.D.   On: 12/06/2015 16:32   [place] Renal  Result Date: 12/06/2015 CLINICAL DATA:  Low urine output.  Acute renal failure. EXAM: RENAL / URINARY TRACT ULTRASOUND COMPLETE COMPARISON:  CT, 12/04/2011 FINDINGS: Right Kidney: Length: 9.9 cm. Echogenicity within normal limits. No mass or hydronephrosis visualized. Left Kidney: Length: 9.9 cm. Echogenicity within normal limits. No mass or hydronephrosis visualized. Bladder: Decompressed with a Foley catheter. IMPRESSION: Normal renal ultrasound.  No hydronephrosis. Electronically Signed   By: Amie Portlandavid  Ormond M.D.   On: 12/06/2015 14:59   Dg Chest Port 1 View  Result Date: 12/06/2015 CLINICAL DATA:  Central line placement.  Initial encounter. EXAM: PORTABLE CHEST 1 VIEW COMPARISON:  Chest radiograph performed 12/05/2015 FINDINGS: The patient's right-sided dual-lumen catheter is noted ending at the right cavoatrial junction. Mild vascular congestion is noted. Mild bibasilar atelectasis is seen. No pleural effusion or pneumothorax identified. The cardiomediastinal silhouette is mildly enlarged. No acute osseous abnormalities are identified. IMPRESSION: 1. Right-sided dual-lumen catheter noted ending about the right cavoatrial junction. 2. Mild vascular congestion and mild cardiomegaly. Mild bibasilar atelectasis seen. Electronically Signed   By: Roanna RaiderJeffery  Chang M.D.   On: 12/06/2015 02:37   Dg Chest Port 1 View  Result Date: 12/05/2015 CLINICAL DATA:  Unresponsive, hypotensive, bradycardia, history asthma, hypertension, diabetes mellitus EXAM: PORTABLE CHEST 1 VIEW COMPARISON:  Portable exam 1451 hours compared to 11/24/2015 FINDINGS: Enlargement of cardiac silhouette. Atherosclerotic calcification aorta. Mediastinal contours and pulmonary vascularity normal. Mild RIGHT basilar atelectasis. Lungs otherwise clear. No pleural effusion or pneumothorax. IMPRESSION:  Enlargement of cardiac silhouette with mild RIGHT basilar atelectasis. Aortic atherosclerosis. Electronically Signed   By: Ulyses SouthwardMark  Boles M.D.   On: 12/05/2015 15:49    Labs: BMET  Recent Labs Lab 12/05/15 1454 12/05/15 1500 12/05/15 1733 12/05/15 2342 12/06/15 0500 12/06/15 1400 12/07/15 0146  NA 123* 128*  --  126* 132* 134* 135  K 8.3* 8.4* 7.1* >7.5* 5.3* 5.0 5.2*  CL 100* 98*  --  99* 99* 100* 98*  CO2  --  18*  --  14* 24 23 27   GLUCOSE 83 82  --  87 103* 97 76  BUN 109* 100*  --  101* 53* 52* 57*  CREATININE 10.00* 8.70*  --  9.01* 5.58* 5.88* 6.24*  CALCIUM  --  8.6*  --  9.1 8.2* 7.8* 8.0*  PHOS  --   --   --  7.7* 5.2*  --  5.8*   CBC  Recent Labs Lab 12/05/15 1454 12/05/15 1500 12/06/15 0500 12/06/15 0730  WBC  --  10.6* 15.8* 12.7*  NEUTROABS  --  7.9*  --   --   HGB 8.8* 9.2* 5.7* 8.2*  HCT 26.0* 28.5* 17.7* 25.7*  MCV  --  88.0 86.8 86.2  PLT  --  277 303 261    Medications:    . insulin aspart  0-9 Units Subcutaneous Q4H  . piperacillin-tazobactam (ZOSYN)  IV  2.25 g Intravenous Q8H   Zetta BillsJay Connie Hilgert, MD 12/07/2015, 9:30 AM

## 2015-12-07 NOTE — Progress Notes (Signed)
Pts current BS was 73. Pt alert and oriented x3. Gave pt apple juice with sugar packets, will reassess BS within 30 mins

## 2015-12-07 NOTE — Progress Notes (Signed)
PROGRESS NOTE    Heidi Campbell  WJX:[id number] DOB: 1945/10/18 DOA: 12/05/2015 PCP: Astrid Divine, MD   Brief Narrative: Heidi Campbell is a 70 y.o. with a history of diabetes mellitus, hypertension, hyperlipidemia, CAD, recent back surgery. Patient presented with altered mental status and found to have acute renal failure with hyperkalemia and likely wound infection.   Assessment & Plan:   Principal Problem:   Acute renal failure (HCC) Active Problems:   Type 2 diabetes mellitus treated with insulin (HCC)   Hypertension   Obesity   Hyperlipidemia   Pressure injury of skin   Somnolence   Acute renal failure Appears secondary to hypotension. Patient currently making urine which is improving. S/p dialysis on 11/4.   Hyperkalemia Improved with dialysis. Appears to be rising again -Nephrology recommendations: Veltassa -renal function panel  Wound infection At the site of previous surgical incision. MRI significant for persistent post-op fluid collection with no evidence of discitis or osteomyelitis -neurosurgery recommendations -continue vancomycin and zosyn  Altered mental status Resolved with dialysis.   Hypertension Normotensive -continue to hold clonidine -hold irbesartan-hydrochlorothiazide -continue to hold amlodipine -continue to hold spironolactone  Hyperlipidemia -hold Crestor in setting of rhabdomyolysis  Diabetes mellitus On metformin, victoza, Lantus 34 and Novolog 15 bid -continue sliding scale insulin -will start Lantus 10u since patient is eating and now has acute renal failure  Anemia Chronic anemia. Normocytic. Acute drop on 11/4 which improved without blood admin per chart review. Currently stable -repeat CBC   DVT prophylaxis: SCDs Code Status: Full code Family Communication: Daughter, sister and brother in law at bedside Disposition Plan: Pending improvement of renal function   Consultants:    Nephrology  Neurosurgery  Procedures:   Dialysis  Antimicrobials:   Ceftriaxone (113>11/4)  Vancomycin (11/3>>  Zosyn (11/4>>   Subjective: Patient reports feeling better. No pain.   Objective: Vitals:   12/07/15 0309 12/07/15 0333 12/07/15 0800 12/07/15 1200  BP: (!) 117/55  (!) 116/58 125/64  Pulse:   77 87  Resp:   10 13  Temp: 98.5 F (36.9 C)  98.5 F (36.9 C) 98.5 F (36.9 C)  TempSrc: Axillary  Axillary Axillary  SpO2:   100% 100%  Weight:  112.7 kg (248 lb 7.3 oz)    Height:        Intake/Output Summary (Last 24 hours) at 12/07/15 1411 Last data filed at 12/07/15 1200  Gross per 24 hour  Intake           1077.5 ml  Output              120 ml  Net            957.5 ml   Filed Weights   12/06/15 0242 12/06/15 0512 12/07/15 0333  Weight: 107 kg (235 lb 14.3 oz) 107 kg (235 lb 14.3 oz) 112.7 kg (248 lb 7.3 oz)    Examination:  General exam: Appears calm and comfortable Respiratory system: Clear to auscultation. Respiratory effort normal. Cardiovascular system: S1 & S2 heard, RRR. No murmurs, rubs, gallops or clicks. Gastrointestinal system: Abdomen is nondistended, soft and nontender. Normal bowel sounds heard. Central nervous system: Alert and oriented. No focal neurological deficits. Extremities: No edema. No calf tenderness Skin: No cyanosis. No rashes Psychiatry: Judgement and insight appear normal. Mood & affect appropriate.     Data Reviewed: I have personally reviewed following labs and imaging studies  CBC:  Recent Labs Lab 12/05/15 1454 12/05/15 1500 12/06/15 0500 12/06/15 0730  WBC  --  10.6* 15.8* 12.7*  NEUTROABS  --  7.9*  --   --   HGB 8.8* 9.2* 5.7* 8.2*  HCT 26.0* 28.5* 17.7* 25.7*  MCV  --  88.0 86.8 86.2  PLT  --  277 303 261   Basic Metabolic Panel:  Recent Labs Lab 12/05/15 1500 12/05/15 1733 12/05/15 2342 12/06/15 0500 12/06/15 1400 12/07/15 0146  NA 128*  --  126* 132* 134* 135  K 8.4* 7.1* >7.5*  5.3* 5.0 5.2*  CL 98*  --  99* 99* 100* 98*  CO2 18*  --  14* 24 23 27   GLUCOSE 82  --  87 103* 97 76  BUN 100*  --  101* 53* 52* 57*  CREATININE 8.70*  --  9.01* 5.58* 5.88* 6.24*  CALCIUM 8.6*  --  9.1 8.2* 7.8* 8.0*  MG  --   --  2.3 2.2  --  2.0  PHOS  --   --  7.7* 5.2*  --  5.8*   GFR: Estimated Creatinine Clearance: 10.1 mL/min (by C-G formula based on SCr of 6.24 mg/dL (H)). Liver Function Tests:  Recent Labs Lab 12/05/15 1500 12/05/15 2342 12/06/15 0500 12/07/15 0146  AST 35  --   --   --   ALT 23  --   --   --   ALKPHOS 52  --   --   --   BILITOT 0.8  --   --   --   PROT 7.1  --   --   --   ALBUMIN 3.3* 2.9* 2.5* 2.3*   No results for input(s): LIPASE, AMYLASE in the last 168 hours.  Recent Labs Lab 12/05/15 1733  AMMONIA 15   Coagulation Profile: No results for input(s): INR, PROTIME in the last 168 hours. Cardiac Enzymes:  Recent Labs Lab 12/05/15 2342 12/06/15 0042  CKTOTAL 1,501*  --   TROPONINI  --  0.13*   BNP (last 3 results) No results for input(s): PROBNP in the last 8760 hours. HbA1C: No results for input(s): HGBA1C in the last 72 hours. CBG:  Recent Labs Lab 12/07/15 0324 12/07/15 0359 12/07/15 0634 12/07/15 0813 12/07/15 1259  GLUCAP 73 123* 107* 98 205*   Lipid Profile: No results for input(s): CHOL, HDL, LDLCALC, TRIG, CHOLHDL, LDLDIRECT in the last 72 hours. Thyroid Function Tests: No results for input(s): TSH, T4TOTAL, FREET4, T3FREE, THYROIDAB in the last 72 hours. Anemia Panel: No results for input(s): VITAMINB12, FOLATE, FERRITIN, TIBC, IRON, RETICCTPCT in the last 72 hours. Sepsis Labs:  Recent Labs Lab 12/05/15 1454 12/05/15 1745 12/05/15 2342 12/05/15 2358 12/06/15 0434 12/06/15 0500 12/07/15 0146  PROCALCITON  --   --  0.74  --   --  0.82 0.81  LATICACIDVEN 1.93* 2.58*  --  2.7* 1.4  --   --     Recent Results (from the past 240 hour(s))  Urine culture     Status: Abnormal   Collection Time: 12/05/15   2:48 PM  Result Value Ref Range Status   Specimen Description URINE, RANDOM  Final   Special Requests NONE  Final   Culture 40,000 COLONIES/mL ENTEROCOCCUS FAECALIS (A)  Final   Report Status 12/07/2015 FINAL  Final   Organism ID, Bacteria ENTEROCOCCUS FAECALIS (A)  Final      Susceptibility   Enterococcus faecalis - MIC*    AMPICILLIN <=2 SENSITIVE Sensitive     LEVOFLOXACIN 1 SENSITIVE Sensitive     NITROFURANTOIN <=16 SENSITIVE Sensitive  VANCOMYCIN 1 SENSITIVE Sensitive     * 40,000 COLONIES/mL ENTEROCOCCUS FAECALIS  Blood Culture (routine x 2)     Status: None (Preliminary result)   Collection Time: 12/05/15  3:00 PM  Result Value Ref Range Status   Specimen Description BLOOD RIGHT HAND  Final   Special Requests IN PEDIATRIC BOTTLE 5CC  Final   Culture   Final    NO GROWTH 2 DAYS [facility]    Report Status PENDING  Incomplete  Blood Culture (routine x 2)     Status: None (Preliminary result)   Collection Time: 12/05/15  5:00 PM  Result Value Ref Range Status   Specimen Description BLOOD LEFT FOREARM  Final   Special Requests BOTTLES DRAWN AEROBIC AND ANAEROBIC 6CC  Final   Culture   Final    NO GROWTH 2 DAYS [facility]    Report Status PENDING  Incomplete  MRSA PCR Screening     Status: None   Collection Time: 12/05/15  8:10 PM  Result Value Ref Range Status   MRSA by PCR NEGATIVE NEGATIVE Final    Comment:        The GeneXpert MRSA Assay (FDA approved for NASAL specimens only), is one component of a comprehensive MRSA colonization surveillance program. It is not intended to diagnose MRSA infection nor to guide or monitor treatment for MRSA infections.          Radiology Studies: Mr Lumbar Spine Wo Contrast  Result Date: 12/06/2015 CLINICAL DATA:  Recent lumbar surgery with sepsis. EXAM: MRI LUMBAR SPINE WITHOUT CONTRAST TECHNIQUE: Multiplanar, multisequence MR imaging of the lumbar spine was performed. No  intravenous contrast was administered. COMPARISON:  11/23/2015 FINDINGS: Segmentation: 5 lumbar type vertebral bodies. The last full intervertebral disc space is labeled L5-S1. This is consistent with the prior MRI. Alignment:  Normal Vertebrae: Mild stable endplate reactive changes. No findings suspicious for diskitis or osteomyelitis. Conus medullaris: Extends to the T12-L1. Level and appears normal. Paraspinal and other soft tissues: Stable postop fluid collection both superficial and deep to the muscle fascia. This could be a postoperative seroma or liquified hematoma. The it does not have the appearance of a CSF leak. Disc levels: T12-L1: Stable left-sided disc extrusion coursing both above and below the disc space. Moderate mass effect on the left side of thecal sac is stable. L1-2: No significant findings or interval change. L2-3:  No significant findings or interval change. L3-4: Posterior and interbody fusion changes. Wide decompressive laminectomy. No spinal or foraminal stenosis. Stable postoperative fluid collection as discussed above. L4-5: Stable bulging annulus and advanced facet disease with mild bilateral lateral recess stenosis. No significant spinal or foraminal stenosis. L5-S1: Stable low disc disease and facet disease. No focal disc protrusion, spinal or foraminal stenosis. IMPRESSION: 1. Stable MR appearance of the lumbar spine since the recent examination from 11/23/2015. Specific findings as discussed above. 2. Stable postoperative fluid collection extending from L2-L4. 3. Stable moderate-sized disc extrusion at T12-L1. 4. No definite MR findings to suggest diskitis or osteomyelitis. Electronically Signed   By: Rudie MeyerP.  Gallerani M.D.   On: 12/06/2015 16:32   [place] Renal  Result Date: 12/06/2015 CLINICAL DATA:  Low urine output.  Acute renal failure. EXAM: RENAL / URINARY TRACT ULTRASOUND COMPLETE COMPARISON:  CT, 12/04/2011 FINDINGS: Right Kidney: Length: 9.9 cm. Echogenicity within normal  limits. No mass or hydronephrosis visualized. Left Kidney: Length: 9.9 cm. Echogenicity within normal limits. No mass or hydronephrosis visualized. Bladder: Decompressed  with a Foley catheter. IMPRESSION: Normal renal ultrasound.  No hydronephrosis. Electronically Signed   By: Amie Portland M.D.   On: 12/06/2015 14:59   Dg Chest Port 1 View  Result Date: 12/06/2015 CLINICAL DATA:  Central line placement.  Initial encounter. EXAM: PORTABLE CHEST 1 VIEW COMPARISON:  Chest radiograph performed 12/05/2015 FINDINGS: The patient's right-sided dual-lumen catheter is noted ending at the right cavoatrial junction. Mild vascular congestion is noted. Mild bibasilar atelectasis is seen. No pleural effusion or pneumothorax identified. The cardiomediastinal silhouette is mildly enlarged. No acute osseous abnormalities are identified. IMPRESSION: 1. Right-sided dual-lumen catheter noted ending about the right cavoatrial junction. 2. Mild vascular congestion and mild cardiomegaly. Mild bibasilar atelectasis seen. Electronically Signed   By: Roanna Raider M.D.   On: 12/06/2015 02:37   Dg Chest Port 1 View  Result Date: 12/05/2015 CLINICAL DATA:  Unresponsive, hypotensive, bradycardia, history asthma, hypertension, diabetes mellitus EXAM: PORTABLE CHEST 1 VIEW COMPARISON:  Portable exam 1451 hours compared to 11/24/2015 FINDINGS: Enlargement of cardiac silhouette. Atherosclerotic calcification aorta. Mediastinal contours and pulmonary vascularity normal. Mild RIGHT basilar atelectasis. Lungs otherwise clear. No pleural effusion or pneumothorax. IMPRESSION: Enlargement of cardiac silhouette with mild RIGHT basilar atelectasis. Aortic atherosclerosis. Electronically Signed   By: Ulyses Southward M.D.   On: 12/05/2015 15:49        Scheduled Meds: . insulin aspart  0-9 Units Subcutaneous Q4H  . piperacillin-tazobactam (ZOSYN)  IV  2.25 g Intravenous Q8H   Continuous Infusions: . dextrose 5 % and 0.9% NaCl 50 mL/hr at  12/07/15 1023     LOS: 2 days     Jacquelin Hawking [facility] 12/07/2015, 2:11 PM Pager: [phone]  If 7PM-7AM, please contact night-coverage [email or web address] Password [email or web address] 12/07/2015, 2:11 PM

## 2015-12-07 NOTE — Progress Notes (Signed)
Patient ID: Heidi Campbell, female   DOB: Dec 25, 1945, 70 y.o.   MRN: [id number] Patient feels better no leg pain no numbness no tingling  Neurologic stable 4+ to 5 out of 5 strength in her lower extremities  MRI shows normal postoperative fluid collection no radiographic or clinical evidence of wound infection. Continue wound care for superficial dehiscence of her incision.  Continue antibiotics

## 2015-12-08 DIAGNOSIS — B952 Enterococcus as the cause of diseases classified elsewhere: Secondary | ICD-10-CM

## 2015-12-08 LAB — RENAL FUNCTION PANEL
ALBUMIN: 2.4 g/dL — AB (ref 3.5–5.0)
ANION GAP: 14 (ref 5–15)
BUN: 52 mg/dL — AB (ref 6–20)
CALCIUM: 8.4 mg/dL — AB (ref 8.9–10.3)
CO2: 23 mmol/L (ref 22–32)
Chloride: 98 mmol/L — ABNORMAL LOW (ref 101–111)
Creatinine, Ser: 6.09 mg/dL — ABNORMAL HIGH (ref 0.44–1.00)
GFR calc Af Amer: 7 mL/min — ABNORMAL LOW (ref >60)
GFR, EST NON AFRICAN AMERICAN: 6 mL/min — AB (ref >60)
Glucose, Bld: 109 mg/dL — ABNORMAL HIGH (ref 65–99)
PHOSPHORUS: 5.3 mg/dL — AB (ref 2.5–4.6)
POTASSIUM: 4.1 mmol/L (ref 3.5–5.1)
Sodium: 135 mmol/L (ref 135–145)

## 2015-12-08 LAB — CBC
HCT: 27 % — ABNORMAL LOW (ref 36.0–46.0)
HEMOGLOBIN: 8.7 g/dL — AB (ref 12.0–15.0)
MCH: 28 pg (ref 26.0–34.0)
MCHC: 32.2 g/dL (ref 30.0–36.0)
MCV: 86.8 fL (ref 78.0–100.0)
PLATELETS: 252 10*3/uL (ref 150–400)
RBC: 3.11 MIL/uL — AB (ref 3.87–5.11)
RDW: 12.8 % (ref 11.5–15.5)
WBC: 10.8 10*3/uL — ABNORMAL HIGH (ref 4.0–10.5)

## 2015-12-08 LAB — MAGNESIUM: MAGNESIUM: 1.9 mg/dL (ref 1.7–2.4)

## 2015-12-08 LAB — GLUCOSE, CAPILLARY
GLUCOSE-CAPILLARY: 124 mg/dL — AB (ref 65–99)
GLUCOSE-CAPILLARY: 128 mg/dL — AB (ref 65–99)
GLUCOSE-CAPILLARY: 139 mg/dL — AB (ref 65–99)
Glucose-Capillary: 113 mg/dL — ABNORMAL HIGH (ref 65–99)
Glucose-Capillary: 180 mg/dL — ABNORMAL HIGH (ref 65–99)
Glucose-Capillary: 200 mg/dL — ABNORMAL HIGH (ref 65–99)
Glucose-Capillary: 214 mg/dL — ABNORMAL HIGH (ref 65–99)

## 2015-12-08 MED ORDER — ACETAMINOPHEN 325 MG PO TABS
650.0000 mg | ORAL_TABLET | ORAL | Status: DC | PRN
  Administered 2015-12-08 – 2015-12-11 (×4): 650 mg via ORAL
  Filled 2015-12-08 (×5): qty 2

## 2015-12-08 NOTE — Progress Notes (Signed)
Pharmacy Antibiotic Note Heidi Campbell is a 70 y.o. female admitted on 12/05/2015 that is currently on day 4 of Zosyn and vancomycin for empiric coverage of AMS and potential wound infection. Pt noted to have AKI on admission and required HD on 12/06/15.  SCr remains elevated from baseline and nephrology following.  Last dose of vancomycin was received on 11/4.    Plan: 1. Zosyn 2.25gm IV q8h (dosed over 30 min).  2. Obtain vancomycin random in am to assess clearance of vancomycin and help guide further dosing needs  Height: 5\' 3"  (160 cm) Weight: 238 lb 8.6 oz (108.2 kg) IBW/kg (Calculated) : 52.4  Temp (24hrs), Avg:99.4 F (37.4 C), Min:98.1 F (36.7 C), Max:100.2 F (37.9 C)   Recent Labs Lab 12/05/15 1454 12/05/15 1500 12/05/15 1745 12/05/15 2342 12/05/15 2358 12/06/15 0434 12/06/15 0500 12/06/15 0730 12/06/15 1400 12/07/15 0146 12/08/15 0226  WBC  --  10.6*  --   --   --   --  15.8* 12.7*  --   --  10.8*  CREATININE 10.00* 8.70*  --  9.01*  --   --  5.58*  --  5.88* 6.24* 6.09*  LATICACIDVEN 1.93*  --  2.58*  --  2.7* 1.4  --   --   --   --   --     Estimated Creatinine Clearance: 10.1 mL/min (by C-G formula based on SCr of 6.09 mg/dL (H)).    Allergies  Allergen Reactions  . Contrast Media [Iodinated Diagnostic Agents]   . Latex     Unknown reaction per MAR   . Ace Inhibitors Cough  . Shellfish Allergy Other (See Comments)    Pt cannot recall at this time    Antimicrobials this admission: 11/3 Ampicillin>>11/3 11/3 Ceftriaxone >>11/3  11/3 Vancomycin >>  11/4 Zosyn >>  Dose adjustments this admission: n/a  Microbiology results: 11/3 BCx: ngtd 11/3 UCx: E.faecalis pan S   Thank you for allowing pharmacy to be a part of this patient's care.  Pollyann SamplesAndy Ramaya Guile, PharmD, BCPS 12/08/2015, 1:17 PM Pager: [phone]

## 2015-12-08 NOTE — Progress Notes (Signed)
Patient admitted with sepsis and renal failure. Source of sepsis unknown.  Patient with some back pain and some residual left anterior thigh/knee pain which is been present ever since surgery and actually predated surgery.  On exam she is somewhat sedated but awakens easily and appears appropriate. Patient has no nuchal rigidity. Straight leg raising is negative bilaterally. Her wound has some mild superficial dehiscence but no evidence of infection. There is no evidence of surrounding cellulitis. There is no wound tenderness. There is no wound drainage. Her motor and sensory examination are intact.  I reviewed the patient's MRI scan of her lumbar spine. This demonstrates normal postoperative change with a small amount of subcutaneous fluid consistent with resolving seroma. Nothing looks under pressure nor does anything look inflamed or irritated.  I see no current evidence of wound infection and I do not think this is the likely source of her sepsis. Continue IV antibiotics and fluid resuscitation for now. May mobilize from my standpoint. Continue dry dressings overlying her lumbar spine but no further wound care need be done.

## 2015-12-08 NOTE — Progress Notes (Signed)
Patient ID: Heidi Campbell, female   DOB: 11-12-1945, 70 y.o.   MRN: [id number]  [facility] Progress Note   Background: Heidi Campbell is a 70 y.o. female with history of T2DM (30 years), hypertension, hyperlipidemia, CAD, asthma, and recent back surgery (L3-4 posterior lumbar interbody fusion back). Patient presented from SNF with altered mental status and found to have acute renal failure with hyperkalemia and possible wound infection. Family reported N/V x 1 week prior to admission and decreased UOP that switched to anuria by time of presentation. Initially with bradycardia that improved with IV Ca. Admission labs BUN 100, creatinine 10, K 8.3 (baseline creatinine 10.6 11/24/15. We were asked to see. Pt rec'd acute HD via temp cath 11/3 + IVF's with bicarb.     Assessment/ Plan:    1. AKI: (baseline 1.0-1.2) appears to be from volume depletion (GI losses) with hypotension in the face of ARB. She underwent emergency hemodialysis for critical hyperkalemia overnight 11/3. Improvement in UOP and hyperkalemia. She is 5.7 L positive on input/output since admission (appropriate for suspected volume depletion). Will discontinue IVFs today because appears fluid overload and has excellent urine output. Bicarb discontinued 11/5, as now > 20. 2. Hyperkalemia: Resolved s/p HD 11/3 and Veltassa 11/5 for potassium lowering. Continue monitoring on telemetry. Would allow renal diet, advancing as tolerated.  3. Altered mental status: Suspected to have been from metabolic encephalopathy including uremic encephalopathy-  appears to have improved with hemodialysis, will continue to monitor. Not planning for further HD, assuming continued improvement.  4. Anemia: acute hemoglobin drop noted 11/5 but appears to have been lab error with normal repeat. No overt loss. 5. S/p bilateral L3/L4 decompressive laminectomy and foraminotomy: Neurosurgery following and recommended continuing antibiotics. MRI with  postoperative fluid collection but not suggestive of infection.   Subjective:   Denies nausea. Has an appetite and interested in eating more.    Objective:   BP 138/65 (BP Location: Right Arm)   Pulse 81   Temp 100 F (37.8 C) (Oral)   Resp 11   Ht 5\' 3"  (1.6 m)   Wt 238 lb 8.6 oz (108.2 kg)   SpO2 98%   BMI 42.26 kg/m   Intake/Output Summary (Last 24 hours) at 12/08/15 0813 Last data filed at 12/08/15 0800  Gross per 24 hour  Intake             1175 ml  Output             1850 ml  Net             -675 ml   Net positive 5.7 since admission.   Weight change: -9 lb 14.7 oz (-4.5 kg)  Physical Exam: Gen: Obese female, very pleasant, alert and awake CVS: Pulse regular rhythm, normal s1 and s2 Resp: Bibasilar crackles, no rales/rhonchi, no increased WOB Abd: soft, obese, NT Ext: +1 pitting edema at thighs  Imaging: Mr Lumbar Spine Wo Contrast  Result Date: 12/06/2015 CLINICAL DATA:  Recent lumbar surgery with sepsis. EXAM: MRI LUMBAR SPINE WITHOUT CONTRAST TECHNIQUE: Multiplanar, multisequence MR imaging of the lumbar spine was performed. No intravenous contrast was administered. COMPARISON:  11/23/2015 FINDINGS: Segmentation: 5 lumbar type vertebral bodies. The last full intervertebral disc space is labeled L5-S1. This is consistent with the prior MRI. Alignment:  Normal Vertebrae: Mild stable endplate reactive changes. No findings suspicious for diskitis or osteomyelitis. Conus medullaris: Extends to the T12-L1. Level and appears normal. Paraspinal and other soft tissues: Stable  postop fluid collection both superficial and deep to the muscle fascia. This could be a postoperative seroma or liquified hematoma. The it does not have the appearance of a CSF leak. Disc levels: T12-L1: Stable left-sided disc extrusion coursing both above and below the disc space. Moderate mass effect on the left side of thecal sac is stable. L1-2: No significant findings or interval change. L2-3:  No  significant findings or interval change. L3-4: Posterior and interbody fusion changes. Wide decompressive laminectomy. No spinal or foraminal stenosis. Stable postoperative fluid collection as discussed above. L4-5: Stable bulging annulus and advanced facet disease with mild bilateral lateral recess stenosis. No significant spinal or foraminal stenosis. L5-S1: Stable low disc disease and facet disease. No focal disc protrusion, spinal or foraminal stenosis. IMPRESSION: 1. Stable MR appearance of the lumbar spine since the recent examination from 11/23/2015. Specific findings as discussed above. 2. Stable postoperative fluid collection extending from L2-L4. 3. Stable moderate-sized disc extrusion at T12-L1. 4. No definite MR findings to suggest diskitis or osteomyelitis. Electronically Signed   By: Rudie MeyerP.  Gallerani M.D.   On: 12/06/2015 16:32   [place] Renal  Result Date: 12/06/2015 CLINICAL DATA:  Low urine output.  Acute renal failure. EXAM: RENAL / URINARY TRACT ULTRASOUND COMPLETE COMPARISON:  CT, 12/04/2011 FINDINGS: Right Kidney: Length: 9.9 cm. Echogenicity within normal limits. No mass or hydronephrosis visualized. Left Kidney: Length: 9.9 cm. Echogenicity within normal limits. No mass or hydronephrosis visualized. Bladder: Decompressed with a Foley catheter. IMPRESSION: Normal renal ultrasound.  No hydronephrosis. Electronically Signed   By: Amie Portlandavid  Ormond M.D.   On: 12/06/2015 14:59     Recent Labs Lab 12/05/15 1454 12/05/15 1500 12/05/15 1733 12/05/15 2342 12/06/15 0500 12/06/15 1400 12/07/15 0146 12/08/15 0226  NA 123* 128*  --  126* 132* 134* 135 135  K 8.3* 8.4* 7.1* >7.5* 5.3* 5.0 5.2* 4.1  CL 100* 98*  --  99* 99* 100* 98* 98*  CO2  --  18*  --  14* 24 23 27 23   GLUCOSE 83 82  --  87 103* 97 76 109*  BUN 109* 100*  --  101* 53* 52* 57* 52*  CREATININE 10.00* 8.70*  --  9.01* 5.58* 5.88* 6.24* 6.09*  CALCIUM  --  8.6*  --  9.1 8.2* 7.8* 8.0* 8.4*  PHOS  --   --   --  7.7* 5.2*  --   5.8* 5.3*     Recent Labs Lab 12/05/15 1500 12/06/15 0500 12/06/15 0730 12/08/15 0226  WBC 10.6* 15.8* 12.7* 10.8*  NEUTROABS 7.9*  --   --   --   HGB 9.2* 5.7* 8.2* 8.7*  HCT 28.5* 17.7* 25.7* 27.0*  MCV 88.0 86.8 86.2 86.8  PLT 277 303 261 252    Medications:    . insulin aspart  0-9 Units Subcutaneous Q4H  . insulin glargine  10 Units Subcutaneous Daily  . piperacillin-tazobactam (ZOSYN)  IV  2.25 g Intravenous Q8H   Dani GobbleHillary Fitzgerald, MD [facility], PGY-2 12/08/2015, 8:13 AM   I have seen and examined this patient and agree with plan and assessment in the above note with renal recommendations/intervention highlighted. Creatinine 6.24->6.09 past 24 hours without additional HD and UOP 1.8 liters past 24 hours. Despite some asterixus on exam, no other HD indication today. Will not HD today, monitor over next 24 hours. Volume up (after IVF resuscitation) so will stop fluids at this point.   Rheana Casebolt B,MD 12/08/2015 4:57 PM

## 2015-12-08 NOTE — Care Management Important Message (Signed)
Important Message  Patient Details  Name: Heidi Campbell MRN: [id number] Date of Birth: [birth year]   Medicare Important Message Given:  Yes    [name], Dorian Duval Abena 12/08/2015, 1:06 PM

## 2015-12-08 NOTE — Progress Notes (Signed)
PROGRESS NOTE    Heidi Campbell  DGL:[id number] DOB: Oct 26, 1945 DOA: 12/05/2015 PCP: Astrid Divine, MD   Brief Narrative: Heidi Campbell is a 70 y.o. with a history of diabetes mellitus, hypertension, hyperlipidemia, CAD, recent back surgery. Patient presented with altered mental status and found to have acute renal failure with hyperkalemia and likely wound infection.   Assessment & Plan:   Principal Problem:   Acute renal failure (HCC) Active Problems:   Type 2 diabetes mellitus treated with insulin (HCC)   Hypertension   Obesity   Hyperlipidemia   Pressure injury of skin   Somnolence   Acute renal failure Appears secondary to hypotension. Patient currently making urine which is improving. S/p dialysis on 11/4. GFR impaired and stable -trend creatinine  Hyperkalemia Improved with dialysis. -Nephrology recommendations: Veltassa -renal function panel  ?Wound infection At the site of previous surgical incision. MRI significant for persistent post-op fluid collection with no evidence of discitis or osteomyelitis. No evidence of infection on exam per wound care description. -neurosurgery recommendations: wound not likely source of infection -discontinue zosyn  UTI Enterococcus faecalis on culture -continue vancomycin  Altered mental status Resolved with dialysis. Alert and oriented.  Hypertension Normotensive -continue to hold clonidine -hold irbesartan-hydrochlorothiazide in setting of renal failure -continue to hold amlodipine -continue to hold spironolactone in setting of renal failure  Hyperlipidemia -hold Crestor in setting of rhabdomyolysis  Diabetes mellitus On metformin, victoza, Lantus 34 and Novolog 15 bid -continue sliding scale insulin -will start Lantus 10u since patient is eating and now has acute renal failure  Anemia Chronic anemia. Normocytic. Acute drop on 11/4 which improved without blood admin per chart review. Currently  stable   DVT prophylaxis: SCDs Code Status: Full code Family Communication: Daughter, sister and brother in law at bedside Disposition Plan: Pending improvement of renal function   Consultants:   Nephrology  Neurosurgery  Procedures:   Dialysis  Antimicrobials:   Ceftriaxone (113>11/4)  Zosyn (11/4>>11/6)  Vancomycin (11/3>>   Subjective: No issues today.  Objective: Vitals:   12/08/15 0354 12/08/15 0800 12/08/15 0825 12/08/15 1147  BP: 138/65  (!) 149/66   Pulse: 81 81 84   Resp: 12 11 (!) 9   Temp: 100 F (37.8 C)  99 F (37.2 C) 99.2 F (37.3 C)  TempSrc: Oral  Oral Axillary  SpO2: 98% 98% 97%   Weight:      Height:        Intake/Output Summary (Last 24 hours) at 12/08/15 1529 Last data filed at 12/08/15 1319  Gross per 24 hour  Intake             1385 ml  Output             3375 ml  Net            -1990 ml   Filed Weights   12/06/15 0512 12/07/15 0333 12/08/15 0352  Weight: 107 kg (235 lb 14.3 oz) 112.7 kg (248 lb 7.3 oz) 108.2 kg (238 lb 8.6 oz)    Examination:  General exam: Appears calm and comfortable Respiratory system: Clear to auscultation. Respiratory effort normal. Cardiovascular system: S1 & S2 heard, RRR. No murmurs, rubs, gallops or clicks. Gastrointestinal system: Abdomen is nondistended, soft and nontender. Normal bowel sounds heard. Central nervous system: Alert and oriented. No focal neurological deficits. Extremities: No edema. No calf tenderness Skin: No cyanosis. No rashes Psychiatry: Judgement and insight appear normal. Mood & affect depressed. Appears slightly withdrawn.  Data Reviewed: I have personally reviewed following labs and imaging studies  CBC:  Recent Labs Lab 12/05/15 1454 12/05/15 1500 12/06/15 0500 12/06/15 0730 12/08/15 0226  WBC  --  10.6* 15.8* 12.7* 10.8*  NEUTROABS  --  7.9*  --   --   --   HGB 8.8* 9.2* 5.7* 8.2* 8.7*  HCT 26.0* 28.5* 17.7* 25.7* 27.0*  MCV  --  88.0 86.8 86.2 86.8   PLT  --  277 303 261 252   Basic Metabolic Panel:  Recent Labs Lab 12/05/15 2342 12/06/15 0500 12/06/15 1400 12/07/15 0146 12/08/15 0226  NA 126* 132* 134* 135 135  K >7.5* 5.3* 5.0 5.2* 4.1  CL 99* 99* 100* 98* 98*  CO2 14* 24 23 27 23   GLUCOSE 87 103* 97 76 109*  BUN 101* 53* 52* 57* 52*  CREATININE 9.01* 5.58* 5.88* 6.24* 6.09*  CALCIUM 9.1 8.2* 7.8* 8.0* 8.4*  MG 2.3 2.2  --  2.0 1.9  PHOS 7.7* 5.2*  --  5.8* 5.3*   GFR: Estimated Creatinine Clearance: 10.1 mL/min (by C-G formula based on SCr of 6.09 mg/dL (H)). Liver Function Tests:  Recent Labs Lab 12/05/15 1500 12/05/15 2342 12/06/15 0500 12/07/15 0146 12/08/15 0226  AST 35  --   --   --   --   ALT 23  --   --   --   --   ALKPHOS 52  --   --   --   --   BILITOT 0.8  --   --   --   --   PROT 7.1  --   --   --   --   ALBUMIN 3.3* 2.9* 2.5* 2.3* 2.4*   No results for input(s): LIPASE, AMYLASE in the last 168 hours.  Recent Labs Lab 12/05/15 1733  AMMONIA 15   Coagulation Profile: No results for input(s): INR, PROTIME in the last 168 hours. Cardiac Enzymes:  Recent Labs Lab 12/05/15 2342 12/06/15 0042  CKTOTAL 1,501*  --   TROPONINI  --  0.13*   BNP (last 3 results) No results for input(s): PROBNP in the last 8760 hours. HbA1C: No results for input(s): HGBA1C in the last 72 hours. CBG:  Recent Labs Lab 12/07/15 2028 12/08/15 0015 12/08/15 0358 12/08/15 0808 12/08/15 1146  GLUCAP 178* 139* 113* 124* 180*   Lipid Profile: No results for input(s): CHOL, HDL, LDLCALC, TRIG, CHOLHDL, LDLDIRECT in the last 72 hours. Thyroid Function Tests: No results for input(s): TSH, T4TOTAL, FREET4, T3FREE, THYROIDAB in the last 72 hours. Anemia Panel: No results for input(s): VITAMINB12, FOLATE, FERRITIN, TIBC, IRON, RETICCTPCT in the last 72 hours. Sepsis Labs:  Recent Labs Lab 12/05/15 1454 12/05/15 1745 12/05/15 2342 12/05/15 2358 12/06/15 0434 12/06/15 0500 12/07/15 0146  PROCALCITON  --    --  0.74  --   --  0.82 0.81  LATICACIDVEN 1.93* 2.58*  --  2.7* 1.4  --   --     Recent Results (from the past 240 hour(s))  Urine culture     Status: Abnormal   Collection Time: 12/05/15  2:48 PM  Result Value Ref Range Status   Specimen Description URINE, RANDOM  Final   Special Requests NONE  Final   Culture 40,000 COLONIES/mL ENTEROCOCCUS FAECALIS (A)  Final   Report Status 12/07/2015 FINAL  Final   Organism ID, Bacteria ENTEROCOCCUS FAECALIS (A)  Final      Susceptibility   Enterococcus faecalis - MIC*    AMPICILLIN <=  2 SENSITIVE Sensitive     LEVOFLOXACIN 1 SENSITIVE Sensitive     NITROFURANTOIN <=16 SENSITIVE Sensitive     VANCOMYCIN 1 SENSITIVE Sensitive     * 40,000 COLONIES/mL ENTEROCOCCUS FAECALIS  Blood Culture (routine x 2)     Status: None (Preliminary result)   Collection Time: 12/05/15  3:00 PM  Result Value Ref Range Status   Specimen Description BLOOD RIGHT HAND  Final   Special Requests IN PEDIATRIC BOTTLE 5CC  Final   Culture   Final    NO GROWTH 3 DAYS Performed at [facility]    Report Status PENDING  Incomplete  Blood Culture (routine x 2)     Status: None (Preliminary result)   Collection Time: 12/05/15  5:00 PM  Result Value Ref Range Status   Specimen Description BLOOD LEFT FOREARM  Final   Special Requests BOTTLES DRAWN AEROBIC AND ANAEROBIC 6CC  Final   Culture   Final    NO GROWTH 3 DAYS Performed at [facility]    Report Status PENDING  Incomplete  MRSA PCR Screening     Status: None   Collection Time: 12/05/15  8:10 PM  Result Value Ref Range Status   MRSA by PCR NEGATIVE NEGATIVE Final    Comment:        The GeneXpert MRSA Assay (FDA approved for NASAL specimens only), is one component of a comprehensive MRSA colonization surveillance program. It is not intended to diagnose MRSA infection nor to guide or monitor treatment for MRSA infections.          Radiology Studies: Mr Lumbar Spine Wo  Contrast  Result Date: 12/06/2015 CLINICAL DATA:  Recent lumbar surgery with sepsis. EXAM: MRI LUMBAR SPINE WITHOUT CONTRAST TECHNIQUE: Multiplanar, multisequence MR imaging of the lumbar spine was performed. No intravenous contrast was administered. COMPARISON:  11/23/2015 FINDINGS: Segmentation: 5 lumbar type vertebral bodies. The last full intervertebral disc space is labeled L5-S1. This is consistent with the prior MRI. Alignment:  Normal Vertebrae: Mild stable endplate reactive changes. No findings suspicious for diskitis or osteomyelitis. Conus medullaris: Extends to the T12-L1. Level and appears normal. Paraspinal and other soft tissues: Stable postop fluid collection both superficial and deep to the muscle fascia. This could be a postoperative seroma or liquified hematoma. The it does not have the appearance of a CSF leak. Disc levels: T12-L1: Stable left-sided disc extrusion coursing both above and below the disc space. Moderate mass effect on the left side of thecal sac is stable. L1-2: No significant findings or interval change. L2-3:  No significant findings or interval change. L3-4: Posterior and interbody fusion changes. Wide decompressive laminectomy. No spinal or foraminal stenosis. Stable postoperative fluid collection as discussed above. L4-5: Stable bulging annulus and advanced facet disease with mild bilateral lateral recess stenosis. No significant spinal or foraminal stenosis. L5-S1: Stable low disc disease and facet disease. No focal disc protrusion, spinal or foraminal stenosis. IMPRESSION: 1. Stable MR appearance of the lumbar spine since the recent examination from 11/23/2015. Specific findings as discussed above. 2. Stable postoperative fluid collection extending from L2-L4. 3. Stable moderate-sized disc extrusion at T12-L1. 4. No definite MR findings to suggest diskitis or osteomyelitis. Electronically Signed   By: Rudie Meyer M.D.   On: 12/06/2015 16:32        Scheduled  Meds: . insulin aspart  0-9 Units Subcutaneous Q4H  . insulin glargine  10 Units Subcutaneous Daily  . piperacillin-tazobactam (ZOSYN)  IV  2.25 g Intravenous Q8H  Continuous Infusions:    LOS: 3 days     Jacquelin HawkingRalph Alanzo Lamb [facility] 12/08/2015, 3:29 PM Pager: [phone]  If 7PM-7AM, please contact night-coverage [email or web address] Password [email or web address] 12/08/2015, 3:29 PM

## 2015-12-09 LAB — RENAL FUNCTION PANEL
ALBUMIN: 2.3 g/dL — AB (ref 3.5–5.0)
ANION GAP: 12 (ref 5–15)
BUN: 41 mg/dL — ABNORMAL HIGH (ref 6–20)
CO2: 26 mmol/L (ref 22–32)
Calcium: 9 mg/dL (ref 8.9–10.3)
Chloride: 101 mmol/L (ref 101–111)
Creatinine, Ser: 5.23 mg/dL — ABNORMAL HIGH (ref 0.44–1.00)
GFR calc Af Amer: 9 mL/min — ABNORMAL LOW (ref >60)
GFR, EST NON AFRICAN AMERICAN: 8 mL/min — AB (ref >60)
GLUCOSE: 84 mg/dL (ref 65–99)
PHOSPHORUS: 4.8 mg/dL — AB (ref 2.5–4.6)
POTASSIUM: 3.3 mmol/L — AB (ref 3.5–5.1)
Sodium: 139 mmol/L (ref 135–145)

## 2015-12-09 LAB — GLUCOSE, CAPILLARY
GLUCOSE-CAPILLARY: 79 mg/dL (ref 65–99)
GLUCOSE-CAPILLARY: 126 mg/dL — AB (ref 65–99)
GLUCOSE-CAPILLARY: 175 mg/dL — AB (ref 65–99)
GLUCOSE-CAPILLARY: 152 mg/dL — AB (ref 65–99)
Glucose-Capillary: 157 mg/dL — ABNORMAL HIGH (ref 65–99)

## 2015-12-09 LAB — MAGNESIUM: Magnesium: 1.8 mg/dL (ref 1.7–2.4)

## 2015-12-09 LAB — VANCOMYCIN, RANDOM: VANCOMYCIN RM: 12

## 2015-12-09 MED ORDER — AMLODIPINE BESYLATE 10 MG PO TABS
10.0000 mg | ORAL_TABLET | Freq: Every day | ORAL | Status: DC
  Administered 2015-12-09 – 2015-12-12 (×4): 10 mg via ORAL
  Filled 2015-12-09 (×4): qty 1

## 2015-12-09 MED ORDER — POTASSIUM CHLORIDE CRYS ER 20 MEQ PO TBCR
20.0000 meq | EXTENDED_RELEASE_TABLET | Freq: Two times a day (BID) | ORAL | Status: AC
  Administered 2015-12-09 (×2): 20 meq via ORAL
  Filled 2015-12-09 (×2): qty 1

## 2015-12-09 MED ORDER — POTASSIUM CHLORIDE CRYS ER 20 MEQ PO TBCR: 20.0000 meq | EXTENDED_RELEASE_TABLET | Freq: Two times a day (BID) | ORAL | Status: DC

## 2015-12-09 MED ORDER — VANCOMYCIN HCL IN DEXTROSE 1-5 GM/200ML-% IV SOLN
1000.0000 mg | Freq: Once | INTRAVENOUS | Status: AC
  Administered 2015-12-09: 1000 mg via INTRAVENOUS
  Filled 2015-12-09: qty 200

## 2015-12-09 NOTE — Progress Notes (Signed)
Pharmacy Antibiotic Note Heidi Campbell is a 70 y.o. female admitted on 12/05/2015 that is currently on vancomycin for empiric coverage of AMS and potential wound infection. Pt noted to have AKI on admission and required HD on 12/06/15.  SCr remains elevated from baseline and nephrology following.  Last dose of vancomycin was received on 11/4.  Random level below goal.  Plan: Vanc 1g IV x1 and continue to monitor plans.  Height: 5\' 3"  (160 cm) Weight: 239 lb 3.2 oz (108.5 kg) IBW/kg (Calculated) : 52.4  Temp (24hrs), Avg:98.9 F (37.2 C), Min:98.5 F (36.9 C), Max:99.2 F (37.3 C)   Recent Labs Lab 12/05/15 1454 12/05/15 1500 12/05/15 1745  12/05/15 2358 12/06/15 0434 12/06/15 0500 12/06/15 0730 12/06/15 1400 12/07/15 0146 12/08/15 0226 12/09/15 0400  WBC  --  10.6*  --   --   --   --  15.8* 12.7*  --   --  10.8*  --   CREATININE 10.00* 8.70*  --   < >  --   --  5.58*  --  5.88* 6.24* 6.09* 5.23*  LATICACIDVEN 1.93*  --  2.58*  --  2.7* 1.4  --   --   --   --   --   --   VANCORANDOM  --   --   --   --   --   --   --   --   --   --   --  12  < > = values in this interval not displayed.  Estimated Creatinine Clearance: 11.8 mL/min (by C-G formula based on SCr of 5.23 mg/dL (H)).    Allergies  Allergen Reactions  . Contrast Media [Iodinated Diagnostic Agents]   . Latex     Unknown reaction per MAR   . Ace Inhibitors Cough  . Shellfish Allergy Other (See Comments)    Pt cannot recall at this time    Antimicrobials this admission: 11/3 Ampicillin>>11/3 11/3 Ceftriaxone >>11/3 11/3 Vancomycin >> 11/4 Zosyn >> 11/6  Dose adjustments this admission: n/a  Microbiology results: 11/3 BCx: ngtd 11/3 UCx: E.faecalis pan S   Thank you for allowing pharmacy to be a part of this patient's care.  Vernard GamblesVeronda Khameron Gruenwald, PharmD, BCPS 12/09/2015 5:56 AM

## 2015-12-09 NOTE — Progress Notes (Signed)
Patient ID: Heidi Campbell, female   DOB: 03/08/1945, 70 y.o.   MRN: [id number]  [facility] Progress Note   Background: Heidi Aloevon V Pospisil is a 70 y.o. female with history of T2DM (30 years), hypertension, hyperlipidemia, CAD, asthma, and recent back surgery (L3-4 posterior lumbar interbody fusion back). Patient presented from SNF with altered mental status and found to have acute renal failure with hyperkalemia and possible wound infection. Family reported N/V x 1 week prior to admission and decreased UOP that switched to anuria by time of presentation. Initially with bradycardia that improved with IV Ca. Admission labs BUN 100, creatinine 10, K 8.3 (baseline creatinine 10.6 11/24/15. We were asked to see. Pt rec'd acute HD via temp cath 11/3 + IVF's with bicarb.     Assessment/ Plan:    1. AKI: (baseline 1.0-1.2) appears to be from volume depletion (GI losses) with hypotension in the face of ARB. She underwent emergency hemodialysis for critical hyperkalemia overnight 11/3. Has done well since initial dialysis session with downtrending SCr and BUN. No longer with hyperkalemia. UOP excellent at 4.0L. Fluid overload improving at 2.8 from 5.8 L. Do not think further dialysis will be needed and will order for HD cath to be pulled today.  2. Hyperkalemia: Resolved s/p HD 11/3 and Veltassa 11/5 for potassium lowering. Renal diet. Continue monitoring on telemetry. Now slightly low at 3.3; replete with 20 mEq Kdur BID x 2 doses.  3. Altered mental status: Suspected to have been from metabolic encephalopathy including uremic encephalopathy-  No longer has asterixis. BUN 41 from 52 yesterday.  4. Anemia: acute hemoglobin drop noted 11/5 but appears to have been lab error with normal repeat. No overt loss. MCV normal. Continue to monitor.  5. HTN: SBPs in the 170s/180s. Primary team restarted home norvasc 10 mg.  6. S/p bilateral L3/L4 decompressive laminectomy and foraminotomy: Neurosurgery  following. MRI with postoperative fluid collection but not suggestive of infection. Afebrile but slight leukocytosis 11/6 to 10.8. Would recommend discontinuing antibiotics as soon as possible to decrease risk of further kidney injury.   I have seen and examined this patient and agree with plan and assessment with renal recommendations/intervention highlighted in the note by Dr. Farley LyMoen. Pt recovering from AKI with improved creatinine, excellent UOP without diuretics. She will not require further dialysis and catheter can be removed.   Earlee Herald B,MD 12/09/2015 1:39 PM   Subjective:   Continues to have a good appetite and no nausea.   Objective:   BP (!) 177/74 (BP Location: Right Arm)   Pulse 81   Temp 99.3 F (37.4 C) (Oral)   Resp 14   Ht 5\' 3"  (1.6 m)   Wt 239 lb 3.2 oz (108.5 kg)   SpO2 99%   BMI 42.37 kg/m   Intake/Output Summary (Last 24 hours) at 12/09/15 0732 Last data filed at 12/09/15 0636  Gross per 24 hour  Intake             1220 ml  Output             4025 ml  Net            -2805 ml   Net positive 2.8 since admission.   Weight change: 10.6 oz (0.3 kg)  Physical Exam: Gen: Obese female, very pleasant, alert and awake CVS: Pulse regular rhythm, normal s1 and s2 Resp: Lungs CTAB, no increased WOB Abd: soft, obese, NT Ext: Swollen hands and feet, trace/1+ edema at thighs Neuro: No  longer with asterixis. AOx3.   Imaging: No results found.   Recent Labs Lab 12/05/15 1500 12/05/15 1733 12/05/15 2342 12/06/15 0500 12/06/15 1400 12/07/15 0146 12/08/15 0226 12/09/15 0400  NA 128*  --  126* 132* 134* 135 135 139  K 8.4* 7.1* >7.5* 5.3* 5.0 5.2* 4.1 3.3*  CL 98*  --  99* 99* 100* 98* 98* 101  CO2 18*  --  14* 24 23 27 23 26   GLUCOSE 82  --  87 103* 97 76 109* 84  BUN 100*  --  101* 53* 52* 57* 52* 41*  CREATININE 8.70*  --  9.01* 5.58* 5.88* 6.24* 6.09* 5.23*  CALCIUM 8.6*  --  9.1 8.2* 7.8* 8.0* 8.4* 9.0  PHOS  --   --  7.7* 5.2*  --  5.8* 5.3*  4.8*     Recent Labs Lab 12/05/15 1500 12/06/15 0500 12/06/15 0730 12/08/15 0226  WBC 10.6* 15.8* 12.7* 10.8*  NEUTROABS 7.9*  --   --   --   HGB 9.2* 5.7* 8.2* 8.7*  HCT 28.5* 17.7* 25.7* 27.0*  MCV 88.0 86.8 86.2 86.8  PLT 277 303 261 252    Medications:    . insulin aspart  0-9 Units Subcutaneous Q4H  . insulin glargine  10 Units Subcutaneous Daily  . potassium chloride  20 mEq Oral BID  . vancomycin  1,000 mg Intravenous Once   Dani GobbleHillary Fitzgerald, MD [facility], PGY-2 12/09/2015, 7:32 AM

## 2015-12-09 NOTE — Progress Notes (Addendum)
PROGRESS NOTE    Heidi Campbell  ZOX:[id number] DOB: 05-May-1945 DOA: 12/05/2015 PCP: Astrid Divine, MD   Brief Narrative: Heidi Campbell is a 70 y.o. with a history of diabetes mellitus, hypertension, hyperlipidemia, CAD, recent back surgery. Patient presented with altered mental status and found to have acute renal failure with hyperkalemia and likely wound infection.   Assessment & Plan:   Principal Problem:   Acute renal failure (HCC) Active Problems:   Type 2 diabetes mellitus treated with insulin (HCC)   Hypertension   Obesity   Hyperlipidemia   Pressure injury of skin   Somnolence   Acute renal failure Appears secondary to hypotension. Patient currently making urine which is improving. S/p dialysis on 11/4. GFR impaired and stable. UOP charted at 4L which is significantly more than intake. Unsure of this. Will continue to trend -trend creatinine -UOP -nephrology recommendations  Right hand swelling Likely secondary to IV infiltration. Has associated sensitivity -OT consult. May benefit from a cock up splint  Hyperkalemia Hypokalemia Improved with dialysis and Valtassa but is now hypokalemic -Nephrology recommendations -renal function panel -Kdur  ?Wound infection S/p bilateral L3/L4 decompressive laminectomy and foraminotomy At the site of previous surgical incision. MRI significant for persistent post-op fluid collection with no evidence of discitis or osteomyelitis. No evidence of infection on exam per wound care description. -neurosurgery recommendations: wound not likely source of infection -PT Eval  UTI Enterococcus faecalis on culture -continue vancomycin  Altered mental status Resolved with dialysis. Alert and oriented.  Hypertension Normotensive -continue to hold clonidine -Restart amlodipine today -hold irbesartan-hydrochlorothiazide in setting of renal failure -continue to hold spironolactone in setting of renal  failure  Hyperlipidemia -hold Crestor in setting of rhabdomyolysis  Diabetes mellitus On metformin, victoza, Lantus 34 and Novolog 15 bid.  -continue sliding scale insulin -continue Lantus 10u. May need to increase if kidney function improve  Anemia Chronic anemia. Normocytic. Acute drop on 11/4 which improved without blood admin per chart review. Currently stable   DVT prophylaxis: SCDs Code Status: Full code Family Communication: Daughter at bedside Disposition Plan: Pending improvement of renal function. Transfer to telemetry   Consultants:   Nephrology  Neurosurgery  Procedures:   Dialysis  Antimicrobials:   Ceftriaxone (113>11/4)  Zosyn (11/4>>11/6)  Vancomycin (11/3>>   Subjective: Patient has a better appetite today. Pain in her right hand with swelling.  Objective: Vitals:   12/09/15 0408 12/09/15 0636 12/09/15 0725 12/09/15 0951  BP:  (!) 171/63 (!) 177/74 (!) 182/78  Pulse:  78 81   Resp:  12 14   Temp:   99.3 F (37.4 C)   TempSrc:   Oral   SpO2:  98% 99%   Weight: 108.5 kg (239 lb 3.2 oz)     Height:        Intake/Output Summary (Last 24 hours) at 12/09/15 1151 Last data filed at 12/09/15 0636  Gross per 24 hour  Intake              710 ml  Output             4025 ml  Net            -3315 ml   Filed Weights   12/07/15 0333 12/08/15 0352 12/09/15 0408  Weight: 112.7 kg (248 lb 7.3 oz) 108.2 kg (238 lb 8.6 oz) 108.5 kg (239 lb 3.2 oz)    Examination:  General exam: Appears calm and comfortable Respiratory system: Clear to auscultation. Respiratory effort normal. Cardiovascular  system: S1 & S2 heard, RRR. No murmurs, rubs, gallops or clicks. Gastrointestinal system: Abdomen is nondistended, soft and nontender. Normal bowel sounds heard. Central nervous system: Alert and oriented. No focal neurological deficits. Extremities: No calf tenderness bilateral hand puffiness with worse greater on right. Right hand sensitive to touch, moreso  on ventral aspect Skin: No cyanosis. No rashes Psychiatry: Judgement and insight appear normal. Mood & affect slightly depressed    Data Reviewed: I have personally reviewed following labs and imaging studies  CBC:  Recent Labs Lab 12/05/15 1454 12/05/15 1500 12/06/15 0500 12/06/15 0730 12/08/15 0226  WBC  --  10.6* 15.8* 12.7* 10.8*  NEUTROABS  --  7.9*  --   --   --   HGB 8.8* 9.2* 5.7* 8.2* 8.7*  HCT 26.0* 28.5* 17.7* 25.7* 27.0*  MCV  --  88.0 86.8 86.2 86.8  PLT  --  277 303 261 252   Basic Metabolic Panel:  Recent Labs Lab 12/05/15 2342 12/06/15 0500 12/06/15 1400 12/07/15 0146 12/08/15 0226 12/09/15 0400  NA 126* 132* 134* 135 135 139  K >7.5* 5.3* 5.0 5.2* 4.1 3.3*  CL 99* 99* 100* 98* 98* 101  CO2 14* 24 23 27 23 26   GLUCOSE 87 103* 97 76 109* 84  BUN 101* 53* 52* 57* 52* 41*  CREATININE 9.01* 5.58* 5.88* 6.24* 6.09* 5.23*  CALCIUM 9.1 8.2* 7.8* 8.0* 8.4* 9.0  MG 2.3 2.2  --  2.0 1.9 1.8  PHOS 7.7* 5.2*  --  5.8* 5.3* 4.8*   GFR: Estimated Creatinine Clearance: 11.8 mL/min (by C-G formula based on SCr of 5.23 mg/dL (H)). Liver Function Tests:  Recent Labs Lab 12/05/15 1500 12/05/15 2342 12/06/15 0500 12/07/15 0146 12/08/15 0226 12/09/15 0400  AST 35  --   --   --   --   --   ALT 23  --   --   --   --   --   ALKPHOS 52  --   --   --   --   --   BILITOT 0.8  --   --   --   --   --   PROT 7.1  --   --   --   --   --   ALBUMIN 3.3* 2.9* 2.5* 2.3* 2.4* 2.3*   No results for input(s): LIPASE, AMYLASE in the last 168 hours.  Recent Labs Lab 12/05/15 1733  AMMONIA 15   Coagulation Profile: No results for input(s): INR, PROTIME in the last 168 hours. Cardiac Enzymes:  Recent Labs Lab 12/05/15 2342 12/06/15 0042  CKTOTAL 1,501*  --   TROPONINI  --  0.13*   BNP (last 3 results) No results for input(s): PROBNP in the last 8760 hours. HbA1C: No results for input(s): HGBA1C in the last 72 hours. CBG:  Recent Labs Lab 12/08/15 1715  12/08/15 2110 12/08/15 2337 12/09/15 0331 12/09/15 0723  GLUCAP 200* 214* 128* 79 126*   Lipid Profile: No results for input(s): CHOL, HDL, LDLCALC, TRIG, CHOLHDL, LDLDIRECT in the last 72 hours. Thyroid Function Tests: No results for input(s): TSH, T4TOTAL, FREET4, T3FREE, THYROIDAB in the last 72 hours. Anemia Panel: No results for input(s): VITAMINB12, FOLATE, FERRITIN, TIBC, IRON, RETICCTPCT in the last 72 hours. Sepsis Labs:  Recent Labs Lab 12/05/15 1454 12/05/15 1745 12/05/15 2342 12/05/15 2358 12/06/15 0434 12/06/15 0500 12/07/15 0146  PROCALCITON  --   --  0.74  --   --  0.82 0.81  LATICACIDVEN 1.93*  2.58*  --  2.7* 1.4  --   --     Recent Results (from the past 240 hour(s))  Urine culture     Status: Abnormal   Collection Time: 12/05/15  2:48 PM  Result Value Ref Range Status   Specimen Description URINE, RANDOM  Final   Special Requests NONE  Final   Culture 40,000 COLONIES/mL ENTEROCOCCUS FAECALIS (A)  Final   Report Status 12/07/2015 FINAL  Final   Organism ID, Bacteria ENTEROCOCCUS FAECALIS (A)  Final      Susceptibility   Enterococcus faecalis - MIC*    AMPICILLIN <=2 SENSITIVE Sensitive     LEVOFLOXACIN 1 SENSITIVE Sensitive     NITROFURANTOIN <=16 SENSITIVE Sensitive     VANCOMYCIN 1 SENSITIVE Sensitive     * 40,000 COLONIES/mL ENTEROCOCCUS FAECALIS  Blood Culture (routine x 2)     Status: None (Preliminary result)   Collection Time: 12/05/15  3:00 PM  Result Value Ref Range Status   Specimen Description BLOOD RIGHT HAND  Final   Special Requests IN PEDIATRIC BOTTLE 5CC  Final   Culture   Final    NO GROWTH 3 DAYS [facility] [place]    Report Status PENDING  Incomplete  Blood Culture (routine x 2)     Status: None (Preliminary result)   Collection Time: 12/05/15  5:00 PM  Result Value Ref Range Status   Specimen Description BLOOD LEFT FOREARM  Final   Special Requests BOTTLES DRAWN AEROBIC AND ANAEROBIC 6CC  Final   Culture    Final    NO GROWTH 3 DAYS [facility]     Report Status PENDING  Incomplete  MRSA PCR Screening     Status: None   Collection Time: 12/05/15  8:10 PM  Result Value Ref Range Status   MRSA by PCR NEGATIVE NEGATIVE Final    Comment:        The GeneXpert MRSA Assay (FDA approved for NASAL specimens only), is one component of a comprehensive MRSA colonization surveillance program. It is not intended to diagnose MRSA infection nor to guide or monitor treatment for MRSA infections.          Radiology Studies: No results found.      Scheduled Meds: . amLODipine  10 mg Oral Daily  . insulin aspart  0-9 Units Subcutaneous Q4H  . insulin glargine  10 Units Subcutaneous Daily  . potassium chloride  20 mEq Oral BID   Continuous Infusions:    LOS: 4 days     Heidi HawkingRalph Jenissa Tyrell [facility] 12/09/2015, 11:51 AM Pager: [phone]  If 7PM-7AM, please contact night-coverage [email or web address] Password [email or web address] 12/09/2015, 11:51 AM

## 2015-12-09 NOTE — Progress Notes (Signed)
Pt transferred to 6E13, report called by Loma SenderBrittney Avery, RN to 6E RN. All belongings sent with family to new room including pt's glasses.

## 2015-12-10 DIAGNOSIS — N390 Urinary tract infection, site not specified: Secondary | ICD-10-CM

## 2015-12-10 DIAGNOSIS — D72829 Elevated white blood cell count, unspecified: Secondary | ICD-10-CM

## 2015-12-10 DIAGNOSIS — N183 Chronic kidney disease, stage 3 (moderate): Secondary | ICD-10-CM

## 2015-12-10 LAB — GLUCOSE, CAPILLARY
GLUCOSE-CAPILLARY: 116 mg/dL — AB (ref 65–99)
GLUCOSE-CAPILLARY: 119 mg/dL — AB (ref 65–99)
GLUCOSE-CAPILLARY: 138 mg/dL — AB (ref 65–99)
GLUCOSE-CAPILLARY: 206 mg/dL — AB (ref 65–99)
Glucose-Capillary: 138 mg/dL — ABNORMAL HIGH (ref 65–99)
Glucose-Capillary: 192 mg/dL — ABNORMAL HIGH (ref 65–99)

## 2015-12-10 LAB — CBC
HCT: 28 % — ABNORMAL LOW (ref 36.0–46.0)
HEMOGLOBIN: 9.1 g/dL — AB (ref 12.0–15.0)
MCH: 27.8 pg (ref 26.0–34.0)
MCHC: 32.5 g/dL (ref 30.0–36.0)
MCV: 85.6 fL (ref 78.0–100.0)
Platelets: 242 10*3/uL (ref 150–400)
RBC: 3.27 MIL/uL — AB (ref 3.87–5.11)
RDW: 12.3 % (ref 11.5–15.5)
WBC: 11.3 10*3/uL — AB (ref 4.0–10.5)

## 2015-12-10 LAB — RENAL FUNCTION PANEL
ALBUMIN: 2.5 g/dL — AB (ref 3.5–5.0)
ANION GAP: 9 (ref 5–15)
BUN: 29 mg/dL — ABNORMAL HIGH (ref 6–20)
CALCIUM: 9.4 mg/dL (ref 8.9–10.3)
CO2: 24 mmol/L (ref 22–32)
Chloride: 105 mmol/L (ref 101–111)
Creatinine, Ser: 4.07 mg/dL — ABNORMAL HIGH (ref 0.44–1.00)
GFR calc non Af Amer: 10 mL/min — ABNORMAL LOW (ref >60)
GFR, EST AFRICAN AMERICAN: 12 mL/min — AB (ref >60)
GLUCOSE: 114 mg/dL — AB (ref 65–99)
PHOSPHORUS: 3.7 mg/dL (ref 2.5–4.6)
Potassium: 3.2 mmol/L — ABNORMAL LOW (ref 3.5–5.1)
SODIUM: 138 mmol/L (ref 135–145)

## 2015-12-10 LAB — CULTURE, BLOOD (ROUTINE X 2)
CULTURE: NO GROWTH
CULTURE: NO GROWTH

## 2015-12-10 LAB — MAGNESIUM: MAGNESIUM: 1.7 mg/dL (ref 1.7–2.4)

## 2015-12-10 MED ORDER — HYDRALAZINE HCL 50 MG PO TABS
50.0000 mg | ORAL_TABLET | Freq: Three times a day (TID) | ORAL | Status: DC
  Administered 2015-12-10 – 2015-12-12 (×6): 50 mg via ORAL
  Filled 2015-12-10 (×7): qty 1

## 2015-12-10 MED ORDER — ONDANSETRON HCL 4 MG/2ML IJ SOLN
4.0000 mg | Freq: Four times a day (QID) | INTRAMUSCULAR | Status: DC | PRN
  Administered 2015-12-10: 4 mg via INTRAVENOUS
  Filled 2015-12-10: qty 2

## 2015-12-10 MED ORDER — POTASSIUM CHLORIDE CRYS ER 20 MEQ PO TBCR
40.0000 meq | EXTENDED_RELEASE_TABLET | Freq: Two times a day (BID) | ORAL | Status: AC
  Administered 2015-12-10 (×2): 40 meq via ORAL
  Filled 2015-12-10 (×2): qty 2

## 2015-12-10 MED ORDER — ROSUVASTATIN CALCIUM 20 MG PO TABS
40.0000 mg | ORAL_TABLET | Freq: Every day | ORAL | Status: DC
Start: 2015-12-11 — End: 2015-12-12
  Administered 2015-12-11 – 2015-12-12 (×2): 40 mg via ORAL
  Filled 2015-12-10 (×2): qty 2

## 2015-12-10 MED ORDER — POTASSIUM CHLORIDE CRYS ER 20 MEQ PO TBCR: 40.0000 meq | EXTENDED_RELEASE_TABLET | Freq: Once | ORAL | Status: DC

## 2015-12-10 NOTE — Progress Notes (Signed)
Pt. Nauseated and paged Schorr,NP.

## 2015-12-10 NOTE — Progress Notes (Signed)
Pt. transferred from 4E via bed to 6E- 13; pt. alert and oriented to person, place; and slow to respond and somewhat foggy affect. Family with pt.; oriented pt. to room.

## 2015-12-10 NOTE — Evaluation (Addendum)
Physical Therapy Evaluation Patient Details Name: Heidi Campbell MRN: [id number] DOB: 03/03/1945 Today's Date: 12/10/2015   History of Present Illness  70 y.o. with a history of diabetes mellitus, hypertension, hyperlipidemia, CAD, recent back surgery. Patient presented with altered mental status and found to have acute renal failure with hyperkalemia and likely wound infection.  Clinical Impression  Patient demonstrates deficits in functional mobility as indicated below. Will need continued skilled PT to address deficits and maximize function will see as indicated and progress as tolerated. Recommend return to ST SNF upon acute discharge as patient will continue to need assist for mobility and safety.    Follow Up Recommendations SNF    Equipment Recommendations  None recommended by PT    Recommendations for Other Services       Precautions / Restrictions Precautions Precautions: Back;Fall Precaution Booklet Issued: No Precaution Comments: patient recent back surgery, precautions in place Required Braces or Orthoses: Spinal Brace (does not have brace available at this time) Restrictions Weight Bearing Restrictions: No      Mobility  Bed Mobility Overal bed mobility: Needs Assistance Bed Mobility: Rolling;Sidelying to Sit Rolling: Min assist Sidelying to sit: Min assist       General bed mobility comments: increased time and effort to perform, limited by fatigue. min assist for positioning   Transfers Overall transfer level: Needs assistance Equipment used: Rolling walker (2 wheeled) Transfers: Sit to/from [employer]Stand;Stand Pivot Transfers Sit to Stand: Min assist;+2 physical assistance Stand pivot transfers: Min assist;+2 physical assistance       General transfer comment: VCs for hand placement and positioning, increased time to elevate to standing with multiple attempts required, moderate assist to power up. Patient with some anxiety and self  limitation  Ambulation/Gait             General Gait Details: patient unable to attempt ambulation today due to discomfort  Stairs            Wheelchair Mobility    Modified Rankin (Stroke Patients Only)       Balance Overall balance assessment: Needs assistance   Sitting balance-Leahy Scale: Fair Sitting balance - Comments: tolerated EOb     Standing balance-Leahy Scale: Poor Standing balance comment: heavy reliance on RW                             Pertinent Vitals/Pain Pain Assessment: Faces Faces Pain Scale: Hurts a little bit Pain Location: left foot  Pain Descriptors / Indicators: Sore Pain Intervention(s): Monitored during session    Home Living Family/patient expects to be discharged to:: Skilled nursing [facility] Living Arrangements: Children Available Help at Discharge: Family;Available 24 hours/day Type of Home: House Home Access: Stairs to enter Entrance Stairs-Rails: Can reach both Entrance Stairs-Number of Steps: 2 Home Layout: One level Home Equipment: Grab bars - tub/shower;Grab bars - toilet;Shower seat - built in;Crutches      Prior Function Level of Independence: Independent         Comments: lives alone, family to assist short term until pt independent     Hand Dominance   Dominant Hand: Right    Extremity/Trunk Assessment   Upper Extremity Assessment: Overall WFL for tasks assessed           Lower Extremity Assessment: Overall WFL for tasks assessed      Cervical / Trunk Assessment:  (s/p recent back surgery)  Communication   Communication: No difficulties  Cognition Arousal/Alertness:  Awake/alert Behavior During Therapy: Flat affect Overall Cognitive Status: Within Functional Limits for tasks assessed                      General Comments General comments (skin integrity, edema, etc.): wound noted of back incision with some purulent edges noted.    Exercises      Assessment/Plan    PT Assessment Patient needs continued PT services  PT Problem List Decreased activity tolerance;Decreased balance;Decreased mobility          PT Treatment Interventions DME instruction;Gait training;Stair training;Functional mobility training;Therapeutic activities;Therapeutic exercise;Balance training;Neuromuscular re-education;Cognitive remediation;Patient/family education    PT Goals (Current goals can be found in the Care Plan section)  Acute Rehab PT Goals Patient Stated Goal: Need to get strong at rehab before home PT Goal Formulation: With patient Time For Goal Achievement: 12/24/15 Potential to Achieve Goals: Good    Frequency Min 3X/week   Barriers to discharge        Co-evaluation PT/OT/SLP Co-Evaluation/Treatment: Yes Reason for Co-Treatment: For patient/therapist safety;Complexity of the patient's impairments (multi-system involvement);Necessary to address cognition/behavior during functional activity PT goals addressed during session: Mobility/safety with mobility OT goals addressed during session: ADL's and self-care       End of Session   Activity Tolerance: Patient tolerated treatment well Patient left: in chair;with call bell/phone within reach Nurse Communication: Mobility status         Time: 0940-1006 PT Time Calculation (min) (ACUTE ONLY): 26 min   Charges:   PT Evaluation $PT Eval Moderate Complexity: 1 Procedure     PT G CodesFabio Asa:        Merit Gadsby J 12/10/2015, 10:12 AM Charlotte Crumbevon Jensen [place], PT DPT  [phone]

## 2015-12-10 NOTE — Progress Notes (Signed)
Patient ID: Heidi Campbell, female   DOB: 1945/08/17, 70 y.o.   MRN: [id number]  [facility] Progress Note   Subjective:   Had nausea overnight. Feels well this morning.   Objective:   BP (!) 160/62 (BP Location: Right Arm)   Pulse 66   Temp 98.7 F (37.1 C) (Oral)   Resp 16   Ht 5\' 3"  (1.6 m)   Wt 230 lb 12.8 oz (104.7 kg)   SpO2 99%   BMI 40.88 kg/m   Intake/Output Summary (Last 24 hours) at 12/10/15 0806 Last data filed at 12/10/15 0425  Gross per 24 hour  Intake              600 ml  Output             4500 ml  Net            -3900 ml   Net negative 1.0 since admission.   Weight change: -8 lb 6.4 oz (-3.81 kg)  Physical Exam: Gen: Obese female, very pleasant, alert and awake, sitting up in bed  CVS: RRR, normal s1 and s2 Resp: Lungs CTAB, no increased WOB Abd: soft, obese, NT Ext: Trace edema at thighs. Swelling of feet and hands much improved.  Neuro: No longer with asterixis. AOx3.   Imaging: No results found.   Recent Labs Lab 12/05/15 2342 12/06/15 0500 12/06/15 1400 12/07/15 0146 12/08/15 0226 12/09/15 0400 12/10/15 0555  NA 126* 132* 134* 135 135 139 138  K >7.5* 5.3* 5.0 5.2* 4.1 3.3* 3.2*  CL 99* 99* 100* 98* 98* 101 105  CO2 14* 24 23 27 23 26 24   GLUCOSE 87 103* 97 76 109* 84 114*  BUN 101* 53* 52* 57* 52* 41* 29*  CREATININE 9.01* 5.58* 5.88* 6.24* 6.09* 5.23* 4.07*  CALCIUM 9.1 8.2* 7.8* 8.0* 8.4* 9.0 9.4  PHOS 7.7* 5.2*  --  5.8* 5.3* 4.8* 3.7     Recent Labs Lab 12/05/15 1500 12/06/15 0500 12/06/15 0730 12/08/15 0226 12/10/15 0555  WBC 10.6* 15.8* 12.7* 10.8* 11.3*  NEUTROABS 7.9*  --   --   --   --   HGB 9.2* 5.7* 8.2* 8.7* 9.1*  HCT 28.5* 17.7* 25.7* 27.0* 28.0*  MCV 88.0 86.8 86.2 86.8 85.6  PLT 277 303 261 252 242    Medications:    . amLODipine  10 mg Oral Daily  . insulin aspart  0-9 Units Subcutaneous Q4H  . insulin glargine  10 Units Subcutaneous Daily   Background: Heidi Campbell is a 70 y.o.  female with history of T2DM (30 years), hypertension, hyperlipidemia, CAD, asthma, and recent back surgery (L3-4 posterior lumbar interbody fusion back). Patient presented from SNF with altered mental status and found to have acute renal failure with hyperkalemia and possible wound infection. Family reported N/V x 1 week prior to admission and decreased UOP that switched to anuria by time of presentation. Initially with bradycardia that improved with IV Ca. Admission labs BUN 100, creatinine 10, K 8.3 (baseline creatinine 10.6 11/24/15. We were asked to see. Pt rec'd acute HD via temp cath 11/3 + IVF's with bicarb.     Patient continues to improve with no further dialysis treatments or administration of IVFs. Nephrology will sign off at this time with patient to follow-up with Dr. Eliott Nineunham for outpatient Nephrology appointment November 20th (arrive at 11 a.m. for 11:30 a.m. appointment). Please see bolded summary recommendations. If she is discharged in the next several days,  she will need to have labs done next week and forwarded to Dr. Elza Rafterunham's office.   Assessment/ Plan:    1. AKI: (baseline 1.0-1.2) appears to be from volume depletion (GI losses) with hypotension in the face of ARB. She underwent emergency hemodialysis for critical hyperkalemia overnight 11/3. Has done well since initial dialysis session with downtrending SCr and BUN. No longer with hyperkalemia. UOP excellent at 4.5L. Now net negative. HD cath discontinued 11/7, as no further need. Continue to trend SCr and K. 2. Hyperkalemia: Resolved s/p HD 11/3 and Veltassa 11/5 for potassium lowering. Renal diet. Continue monitoring on telemetry. Now slightly low at 3.2; replete with 40 mEq Kdur BID x 2 doses. Replete K as needed, appears to be auto-diuresing at present.  3. Altered mental status: Resolved. Suspected to have been from metabolic encephalopathy including uremic encephalopathy-  No longer has asterixis. BUN to 29 from 41 yesterday. 4.  Anemia: acute hemoglobin drop noted 11/5 but appears to have been lab error with normal repeat. No overt loss. MCV normal. Continue to monitor.  5. HTN: SBPs in the 160s/170s. Primary team restarted home norvasc 10 mg 11/7. Restart home hydralazine 50 mg TID. Would not restart ARB.  6. S/p bilateral L3/L4 decompressive laminectomy and foraminotomy: Neurosurgery following. MRI with postoperative fluid collection but not suggestive of infection. Afebrile but slight leukocytosis to 11.3. Last dose of vancomycin 11/7 (Ceftriazone 11/3>11/4; zosyn 11/4>11/6; vancomycin 11/3>>11/7). Monitor for worsening leukocytosis/fever. 7. Insignificant growth on UCx 11/3 of E.faecalis - pan S    Dani GobbleHillary Fitzgerald, MD [facility], PGY-2 12/10/2015, 8:06 AM   I have seen and examined this patient and agree with plan and assessment in the above note with renal recommendations/intervention highlighted. Please see all bolded and highlighted problems. Renal function continues to improve and pt is autodiuresing. Requiring intermittent K repletion and this will need followed up. Would not restart ARB in this patient.  I will see her in the office on 12/22/15 (arrive at 11AM for 11:30 appt) and until renal function returns to baseline. If she is discharged in the next few days would need labs next week to be forwarded to my office.  Levie Wages B,MD 12/10/2015 12:41 PM

## 2015-12-10 NOTE — Evaluation (Signed)
Occupational Therapy Evaluation Patient Details Name: Heidi Campbell MRN: [id number] DOB: [birth year] Today's Date: 12/10/2015    History of Present Illness 70 y.o. with a history of diabetes mellitus, hypertension, hyperlipidemia, CAD, recent back surgery. Patient presented with altered mental status and found to have acute renal failure with hyperkalemia and likely wound infection.   Clinical Impression   Pt was participating in rehab at SNF prior to admission. Presents with generalized weakness, impaired balance, back pain, impaired memory and slow processing. 2 person assist required for OOB this visit, with pt self limiting. Educated pt in back precautions and elevating R UE for edema management. Recommending return to SNF upon d/c. Will follow acutely.    Follow Up Recommendations  SNF;Supervision/Assistance - 24 hour    Equipment Recommendations       Recommendations for Other Services       Precautions / Restrictions Precautions Precautions: Back;Fall Precaution Booklet Issued: No Precaution Comments: pt able to recall 1 of 3 back precautions, educated Required Braces or Orthoses: Spinal Brace (brace not available) Restrictions Weight Bearing Restrictions: No      Mobility Bed Mobility Overal bed mobility: Needs Assistance Bed Mobility: Rolling;Sidelying to Sit Rolling: Min assist Sidelying to sit: Mod assist       General bed mobility comments: assist to advance LEs to EOB and to raise trunk  Transfers Overall transfer level: Needs assistance Equipment used: Rolling walker (2 wheeled) Transfers: Sit to/from UGI CorporationStand;Stand Pivot Transfers Sit to Stand: Min assist;+2 physical assistance Stand pivot transfers: Min assist;+2 physical assistance       General transfer comment: VCs for hand placement and positioning, increased time to elevate to standing with multiple attempts required, moderate assist to power up. Patient with some anxiety and self  limitation    Balance Overall balance assessment: Needs assistance   Sitting balance-Leahy Scale: Fair Sitting balance - Comments: tolerated EOb     Standing balance-Leahy Scale: Poor Standing balance comment: heavy reliance on RW                            ADL Overall ADL's : Needs assistance/impaired Eating/Feeding: Independent;Sitting   Grooming: Wash/dry hands;Wash/dry face;Sitting;Set up   Upper Body Bathing: Minimal assitance;Sitting   Lower Body Bathing: Total assistance;Sit to/from stand   Upper Body Dressing : Minimal assistance;Sitting   Lower Body Dressing: Total assistance;Sit to/from stand   Toilet Transfer: Minimal assistance;Stand-pivot;RW                   Vision     Perception     Praxis      Pertinent Vitals/Pain Pain Assessment: Faces Faces Pain Scale: Hurts little more Pain Location: back Pain Descriptors / Indicators: Sore Pain Intervention(s): Monitored during session     Hand Dominance Right   Extremity/Trunk Assessment Upper Extremity Assessment Upper Extremity Assessment: Overall WFL for tasks assessed   Lower Extremity Assessment Lower Extremity Assessment: Defer to PT evaluation   Cervical / Trunk Assessment Cervical / Trunk Assessment: Other exceptions (recent back sx)   Communication Communication Communication: No difficulties   Cognition Arousal/Alertness: Awake/alert Behavior During Therapy: Flat affect Overall Cognitive Status: Impaired/Different from baseline Area of Impairment: Memory;Problem solving     Memory: Decreased recall of precautions       Problem Solving: Slow processing     General Comments       Exercises       Shoulder Instructions  Home Living Family/patient expects to be discharged to:: Skilled nursing facility [facility] Living Arrangements: Children Available Help at Discharge: Family;Available 24 hours/day Type of Home: House Home Access: Stairs to  enter Entergy CorporationEntrance Stairs-Number of Steps: 2 Entrance Stairs-Rails: Can reach both Home Layout: One level               Home Equipment: Grab bars - tub/shower;Grab bars - toilet;Shower seat - built in;Crutches          Prior Functioning/Environment Level of Independence: Independent        Comments: lives alone, family to assist short term until pt independent        OT Problem List: Decreased strength;Decreased activity tolerance;Impaired balance (sitting and/or standing);Decreased cognition;Decreased knowledge of use of DME or AE;Decreased knowledge of precautions;Obesity;Pain   OT Treatment/Interventions: Self-care/ADL training;DME and/or AE instruction;Therapeutic activities;Cognitive remediation/compensation;Patient/family education;Balance training    OT Goals(Current goals can be found in the care plan section) Acute Rehab OT Goals Patient Stated Goal: Need to get strong at rehab before home OT Goal Formulation: With patient Time For Goal Achievement: 12/17/15 Potential to Achieve Goals: Good ADL Goals Pt Will Perform Grooming: with min guard assist;standing Pt Will Perform Lower Body Bathing: with min guard assist;with adaptive equipment;sit to/from stand Pt Will Perform Lower Body Dressing: with min guard assist;with adaptive equipment;sit to/from stand Pt Will Transfer to Toilet: with min guard assist;ambulating;bedside commode (over toilet) Pt Will Perform Toileting - Clothing Manipulation and hygiene: with min guard assist;sit to/from stand Additional ADL Goal #1: Pt will perform bed mobility with min assist adhering to back precautions. Additional ADL Goal #2: Pt will state 3/3 back precautions.  OT Frequency: Min 2X/week   Barriers to D/C:            Co-evaluation PT/OT/SLP Co-Evaluation/Treatment: Yes Reason for Co-Treatment: For patient/therapist safety PT goals addressed during session: Mobility/safety with mobility OT goals addressed during session:  ADL's and self-care      End of Session Equipment Utilized During Treatment: Rolling walker  Activity Tolerance: Patient tolerated treatment well Patient left: in chair;with call bell/phone within reach   Time: 0945-1010 OT Time Calculation (min): 25 min Charges:  OT General Charges $OT Visit: 1 Procedure OT Evaluation $OT Eval Moderate Complexity: 1 Procedure G-Codes:    Evern BioMayberry, Abraham Margulies Lynn 12/10/2015, 11:50 AM

## 2015-12-10 NOTE — Progress Notes (Signed)
Patient ID: Heidi Campbell, female   DOB: [birth year], 70 y.o.   MRN: [id number]  PROGRESS NOTE    Heidi Campbell  [id number] DOB: [birth year] DOA: 12/05/2015  PCP: Astrid DivineGRIFFIN,ELAINE COLLINS, MD   Brief Narrative:  70 y.o. with a history of diabetes mellitus, hypertension, hyperlipidemia, CAD, recent back surgery (L3-4 posterior lumbar interbody fusion back) who presented from with altered mental status and found to have acute renal failure with hyperkalemia and likely wound infection. Family also reported nausea and vomiting for 1 week PTA and decreased urine output.    Assessment & Plan:   Acute renal failure superimposed on CKD stage 3 - Appears secondary to volume depletion (GI losses) with hypotension in the setting of ARB - Baseline Cr 1.39 about 7 months ago  - Pt underwent emergency hemodialysis for critical hyperkalemia overnight 11/3 - Continue to monitor intake and output  - Nephrology following - Cr improving   Right hand swelling - Likely secondary to IV infiltration. Has associated sensitivity - Seems better this am   Hyperkalemia / Hypokalemia - Hyperkalemia improved with dialysis and Valtassa but subsequently hypokalemic which is being supplemented   Wound infection / S/p bilateral L3/L4 decompressive laminectomy and foraminotomy - At the site of previous surgical incision. MRI significant for persistent post-op fluid collection with no evidence of discitis or osteomyelitis. No evidence of infection on exam per wound care description. - Neurosurgery recommendations: wound not likely source of infection  UTI / Leukocytosis  - Enterococcus faecalis on culture -continue vancomycin  Acute metabolic encephalopathy  - Likely due to electrolyte abnormalities - Mental status much better this am   Hypertension, essential - Continue norvasc  Dyslipidemia associated with type 2 DM - Resume Crestor   Diabetes mellitus with peripheral circulatory disease with  long term insulin use - On metformin, victoza, Lantus 34 and Novolog 15 bid at home - CBG's in past 24 hours: 138, 116, 119 - Currently on Lantus 10 units daily and SSI  Anemia of chronic kidney disease  - Hgb stable at 9.1   DVT prophylaxis: SCDs Code Status: Full code Family Communication: Family not at the bedside this am Disposition Plan: SNF once Cr closer to normal values    Consultants:   Nephrology  Neurosurgery  Procedures:   Dialysis  Antimicrobials:   Ceftriaxone (113>11/4)  Zosyn (11/4>>11/6)  Vancomycin 11/3 -->  Subjective: No overnight events.   Objective: Vitals:   12/09/15 2000 12/09/15 2116 12/10/15 0421 12/10/15 1024  BP: (!) 174/84 (!) 172/68 (!) 160/62 (!) 189/70  Pulse: 84 84 66 78  Resp: 13 16 16 17   Temp: 98.8 F (37.1 C) 98.7 F (37.1 C) 98.7 F (37.1 C) 98.2 F (36.8 C)  TempSrc: Oral Oral Oral Oral  SpO2: 98% 99% 99% 97%  Weight:  104.7 kg (230 lb 12.8 oz)    Height:  5\' 3"  (1.6 m)      Intake/Output Summary (Last 24 hours) at 12/10/15 1108 Last data filed at 12/10/15 0930  Gross per 24 hour  Intake              780 ml  Output             5250 ml  Net            -4470 ml   Filed Weights   12/08/15 0352 12/09/15 0408 12/09/15 2116  Weight: 108.2 kg (238 lb 8.6 oz) 108.5 kg (239 lb 3.2 oz) 104.7 kg (230 lb 12.8 oz)  Examination:  General exam: Appears calm and comfortable  Respiratory system: Clear to auscultation. Respiratory effort normal. Cardiovascular system: S1 & S2 heard, Rate controlled  Gastrointestinal system: Abdomen is nondistended, soft and nontender. No organomegaly or masses felt. Normal bowel sounds heard. Central nervous system: Alert and oriented. No focal neurological deficits. Extremities: Symmetric 5 x 5 power. Skin: No rashes, lesions or ulcers Psychiatry: Judgement and insight appear normal. Mood & affect appropriate.   Data Reviewed: I have personally reviewed following labs and  imaging studies  CBC:  Recent Labs Lab 12/05/15 1500 12/06/15 0500 12/06/15 0730 12/08/15 0226 12/10/15 0555  WBC 10.6* 15.8* 12.7* 10.8* 11.3*  NEUTROABS 7.9*  --   --   --   --   HGB 9.2* 5.7* 8.2* 8.7* 9.1*  HCT 28.5* 17.7* 25.7* 27.0* 28.0*  MCV 88.0 86.8 86.2 86.8 85.6  PLT 277 303 261 252 242   Basic Metabolic Panel:  Recent Labs Lab 12/06/15 0500 12/06/15 1400 12/07/15 0146 12/08/15 0226 12/09/15 0400 12/10/15 0555  NA 132* 134* 135 135 139 138  K 5.3* 5.0 5.2* 4.1 3.3* 3.2*  CL 99* 100* 98* 98* 101 105  CO2 24 23 27 23 26 24   GLUCOSE 103* 97 76 109* 84 114*  BUN 53* 52* 57* 52* 41* 29*  CREATININE 5.58* 5.88* 6.24* 6.09* 5.23* 4.07*  CALCIUM 8.2* 7.8* 8.0* 8.4* 9.0 9.4  MG 2.2  --  2.0 1.9 1.8 1.7  PHOS 5.2*  --  5.8* 5.3* 4.8* 3.7   GFR: Estimated Creatinine Clearance: 14.9 mL/min (by C-G formula based on SCr of 4.07 mg/dL (H)). Liver Function Tests:  Recent Labs Lab 12/05/15 1500  12/06/15 0500 12/07/15 0146 12/08/15 0226 12/09/15 0400 12/10/15 0555  AST 35  --   --   --   --   --   --   ALT 23  --   --   --   --   --   --   ALKPHOS 52  --   --   --   --   --   --   BILITOT 0.8  --   --   --   --   --   --   PROT 7.1  --   --   --   --   --   --   ALBUMIN 3.3*  < > 2.5* 2.3* 2.4* 2.3* 2.5*  < > = values in this interval not displayed. No results for input(s): LIPASE, AMYLASE in the last 168 hours.  Recent Labs Lab 12/05/15 1733  AMMONIA 15   Coagulation Profile: No results for input(s): INR, PROTIME in the last 168 hours. Cardiac Enzymes:  Recent Labs Lab 12/05/15 2342 12/06/15 0042  CKTOTAL 1,501*  --   TROPONINI  --  0.13*   BNP (last 3 results) No results for input(s): PROBNP in the last 8760 hours. HbA1C: No results for input(s): HGBA1C in the last 72 hours. CBG:  Recent Labs Lab 12/09/15 1611 12/09/15 2006 12/10/15 0051 12/10/15 0429 12/10/15 0736  GLUCAP 152* 157* 138* 116* 119*   Lipid Profile: No results for  input(s): CHOL, HDL, LDLCALC, TRIG, CHOLHDL, LDLDIRECT in the last 72 hours. Thyroid Function Tests: No results for input(s): TSH, T4TOTAL, FREET4, T3FREE, THYROIDAB in the last 72 hours. Anemia Panel: No results for input(s): VITAMINB12, FOLATE, FERRITIN, TIBC, IRON, RETICCTPCT in the last 72 hours. Urine analysis:    Component Value Date/Time   COLORURINE YELLOW 12/07/2015 1130  APPEARANCEUR TURBID (A) 12/07/2015 1130   LABSPEC 1.013 12/07/2015 1130   PHURINE 6.0 12/07/2015 1130   GLUCOSEU NEGATIVE 12/07/2015 1130   GLUCOSEU NEGATIVE 07/03/2013 0856   HGBUR LARGE (A) 12/07/2015 1130   BILIRUBINUR NEGATIVE 12/07/2015 1130   BILIRUBINUR Neg 08/06/2014 1058   KETONESUR NEGATIVE 12/07/2015 1130   PROTEINUR 100 (A) 12/07/2015 1130   UROBILINOGEN 0.2 08/06/2014 1058   UROBILINOGEN 1.0 07/26/2014 1804   NITRITE NEGATIVE 12/07/2015 1130   LEUKOCYTESUR MODERATE (A) 12/07/2015 1130   Sepsis Labs: @LABRCNTIP (procalcitonin:4,lacticidven:4)    Urine culture     Status: Abnormal   Collection Time: 12/05/15  2:48 PM  Result Value Ref Range Status   Specimen Description URINE, RANDOM  Final   Special Requests NONE  Final   Report Status 12/07/2015 FINAL  Final   Organism ID, Bacteria ENTEROCOCCUS FAECALIS (A)  Final      Susceptibility   Enterococcus faecalis - MIC*    AMPICILLIN <=2 SENSITIVE Sensitive     LEVOFLOXACIN 1 SENSITIVE Sensitive     NITROFURANTOIN <=16 SENSITIVE Sensitive     VANCOMYCIN 1 SENSITIVE Sensitive     * 40,000 COLONIES/mL ENTEROCOCCUS FAECALIS  Blood Culture (routine x 2)     Status: None   Collection Time: 12/05/15  3:00 PM  Result Value Ref Range Status   Specimen Description BLOOD RIGHT HAND  Final   Special Requests IN PEDIATRIC BOTTLE 5CC  Final   Culture   Final    NO GROWTH 5 DAYS    Report Status 12/10/2015 FINAL  Final  Blood Culture (routine x 2)     Status: None   Collection Time: 12/05/15  5:00 PM  Result Value Ref Range Status   Specimen  Description BLOOD LEFT FOREARM  Final   Culture   Final    NO GROWTH 5 DAYS   Report Status 12/10/2015 FINAL  Final  MRSA PCR Screening     Status: None   Collection Time: 12/05/15  8:10 PM  Result Value Ref Range Status   MRSA by PCR NEGATIVE NEGATIVE Final      Radiology Studies: Mr Lumbar Spine Wo Contrast Result Date: 12/06/2015 1. Stable MR appearance of the lumbar spine since the recent examination from 11/23/2015. Specific findings as discussed above. 2. Stable postoperative fluid collection extending from L2-L4. 3. Stable moderate-sized disc extrusion at T12-L1. 4. No definite MR findings to suggest diskitis or osteomyelitis.   US Renal Result Date: 12/06/2015 Normal renal ultrasound.  No hydronephrosis.    Scheduled Meds: . amLODipine  10 mg Oral Daily  . insulin aspart  0-9 Units Subcutaneous Q4H  . insulin glargine  10 Units Subcutaneous Daily  . potassium chloride  40 mEq Oral BID   Continuous Infusions:   LOS: 5 days    Time spent: 25 minutes  Greater than 50% of the time spent on counseling and coordinating the care.   Manson Passey, MD [facility] Pager [phone]  If 7PM-7AM, please contact night-coverage [email or web address] Password [email or web address] 12/10/2015, 11:08 AM

## 2015-12-10 NOTE — Progress Notes (Signed)
Offered Zofran for nausea and stated she felt better.

## 2015-12-11 DIAGNOSIS — E784 Other hyperlipidemia: Secondary | ICD-10-CM

## 2015-12-11 LAB — GLUCOSE, CAPILLARY
GLUCOSE-CAPILLARY: 106 mg/dL — AB (ref 65–99)
GLUCOSE-CAPILLARY: 137 mg/dL — AB (ref 65–99)
Glucose-Capillary: 135 mg/dL — ABNORMAL HIGH (ref 65–99)
Glucose-Capillary: 138 mg/dL — ABNORMAL HIGH (ref 65–99)
Glucose-Capillary: 170 mg/dL — ABNORMAL HIGH (ref 65–99)
Glucose-Capillary: 187 mg/dL — ABNORMAL HIGH (ref 65–99)

## 2015-12-11 LAB — CBC
HCT: 28.5 % — ABNORMAL LOW (ref 36.0–46.0)
Hemoglobin: 9.3 g/dL — ABNORMAL LOW (ref 12.0–15.0)
MCH: 27.9 pg (ref 26.0–34.0)
MCHC: 32.6 g/dL (ref 30.0–36.0)
MCV: 85.6 fL (ref 78.0–100.0)
PLATELETS: 246 10*3/uL (ref 150–400)
RBC: 3.33 MIL/uL — AB (ref 3.87–5.11)
RDW: 12.4 % (ref 11.5–15.5)
WBC: 11.8 10*3/uL — ABNORMAL HIGH (ref 4.0–10.5)

## 2015-12-11 LAB — RENAL FUNCTION PANEL
ALBUMIN: 2.6 g/dL — AB (ref 3.5–5.0)
ANION GAP: 8 (ref 5–15)
BUN: 23 mg/dL — ABNORMAL HIGH (ref 6–20)
CALCIUM: 9.4 mg/dL (ref 8.9–10.3)
CO2: 20 mmol/L — AB (ref 22–32)
CREATININE: 3.28 mg/dL — AB (ref 0.44–1.00)
Chloride: 111 mmol/L (ref 101–111)
GFR calc non Af Amer: 13 mL/min — ABNORMAL LOW (ref >60)
GFR, EST AFRICAN AMERICAN: 15 mL/min — AB (ref >60)
GLUCOSE: 136 mg/dL — AB (ref 65–99)
PHOSPHORUS: 2.9 mg/dL (ref 2.5–4.6)
Potassium: 3.8 mmol/L (ref 3.5–5.1)
SODIUM: 139 mmol/L (ref 135–145)

## 2015-12-11 LAB — MAGNESIUM: Magnesium: 1.5 mg/dL — ABNORMAL LOW (ref 1.7–2.4)

## 2015-12-11 NOTE — Progress Notes (Signed)
Pt seems to be recovering well. Labs improving. Afebrile.  Superificial wound dehiscence appears to be healing well with no evidence of infection.  No new rec's

## 2015-12-11 NOTE — Progress Notes (Addendum)
Patient ID: Heidi Campbell, female   DOB: [birth year], 70 y.o.   MRN: [id number]  PROGRESS NOTE    RYHANNA DUNSMORE  [id number] DOB: Nov 28, 1945 DOA: 12/05/2015  PCP: Astrid Divine, MD   Brief Narrative:  70 y.o. with a history of diabetes mellitus, hypertension, hyperlipidemia, CAD, recent back surgery (L3-4 posterior lumbar interbody fusion back) who presented from with altered mental status and found to have acute renal failure with hyperkalemia and likely wound infection. Family also reported nausea and vomiting for 1 week PTA and decreased urine output.    Assessment & Plan:   Acute renal failure superimposed on CKD stage 3 - Appears secondary to volume depletion (GI losses) with hypotension in the setting of ARB - Baseline Cr 1.39 about 7 months ago  - Pt underwent emergency hemodialysis for critical hyperkalemia overnight 11/3 - Nephrology following - Cr continues to improve   Right hand swelling - Likely secondary to IV infiltration. Has associated sensitivity - Improving   Hyperkalemia / Hypokalemia - Hyperkalemia improved with dialysis and Valtassa - Subsequently hypokalemic which is being supplemented and potassium now WNL  Wound infection / S/p bilateral L3/L4 decompressive laminectomy and foraminotomy - At the site of previous surgical incision. MRI significant for persistent post-op fluid collection with no evidence of discitis or osteomyelitis.  - Neurosurgery recommendations: wound not likely source of infection  UTI / Leukocytosis  - Enterococcus faecalis on culture - Vanco, pharmacy dosing   Acute metabolic encephalopathy  - Likely due to electrolyte abnormalities - Mental status better   Hypertension, essential - Continue Norvasc and hydralazine   Dyslipidemia associated with type 2 DM - Continue Crestor   Diabetes mellitus with peripheral circulatory disease with long term insulin use - On metformin, victoza, Lantus 34 and Novolog 15  bid at home - CBG's in past 24 hours:106, 137, 135 - Currently on Lantus 10 units daily and SSI  Anemia of chronic kidney disease  - Hgb stable at 9.1, 9.3   DVT prophylaxis: SCDs Code Status: Full code Family Communication: Family not at the bedside this am Disposition Plan: SNF once Cr closer to normal values    Consultants:   Nephrology  Neurosurgery  Procedures:   Dialysis  Antimicrobials:   Ceftriaxone (113>11/4)  Zosyn (11/4>>11/6)  Vancomycin 11/3 -->  Subjective: No overnight events.   Objective: Vitals:   12/10/15 1710 12/10/15 2109 12/11/15 0527 12/11/15 0934  BP: (!) 142/53 (!) 133/55 (!) 157/54 (!) 165/63  Pulse: 79 82 78 86  Resp: 17   17  Temp: 98.3 F (36.8 C) 98.2 F (36.8 C) 98.2 F (36.8 C) 99.1 F (37.3 C)  TempSrc: Oral Oral Oral Oral  SpO2: 100% 99% 99% 98%  Weight:      Height:        Intake/Output Summary (Last 24 hours) at 12/11/15 1044 Last data filed at 12/11/15 9562  Gross per 24 hour  Intake              520 ml  Output             2175 ml  Net            -1655 ml   Filed Weights   12/08/15 0352 12/09/15 0408 12/09/15 2116  Weight: 108.2 kg (238 lb 8.6 oz) 108.5 kg (239 lb 3.2 oz) 104.7 kg (230 lb 12.8 oz)    Examination:  General exam: Appears calm and comfortable  Respiratory system: Clear to auscultation. Respiratory effort normal.  Cardiovascular system: S1 & S2 heard, Rate controlled  Gastrointestinal system: Abdomen is nondistended, soft and nontender. No organomegaly or masses felt. Normal bowel sounds heard. Central nervous system: Alert and oriented. No focal neurological deficits. Extremities: Symmetric 5 x 5 power. Skin: No rashes, lesions or ulcers Psychiatry: Judgement and insight appear normal. Mood & affect appropriate.   Data Reviewed: I have personally reviewed following labs and imaging studies  CBC:  Recent Labs Lab 12/05/15 1500 12/06/15 0500 12/06/15 0730 12/08/15 0226  12/10/15 0555 12/11/15 0506  WBC 10.6* 15.8* 12.7* 10.8* 11.3* 11.8*  NEUTROABS 7.9*  --   --   --   --   --   HGB 9.2* 5.7* 8.2* 8.7* 9.1* 9.3*  HCT 28.5* 17.7* 25.7* 27.0* 28.0* 28.5*  MCV 88.0 86.8 86.2 86.8 85.6 85.6  PLT 277 303 261 252 242 246   Basic Metabolic Panel:  Recent Labs Lab 12/07/15 0146 12/08/15 0226 12/09/15 0400 12/10/15 0555 12/11/15 0506  NA 135 135 139 138 139  K 5.2* 4.1 3.3* 3.2* 3.8  CL 98* 98* 101 105 111  CO2 27 23 26 24  20*  GLUCOSE 76 109* 84 114* 136*  BUN 57* 52* 41* 29* 23*  CREATININE 6.24* 6.09* 5.23* 4.07* 3.28*  CALCIUM 8.0* 8.4* 9.0 9.4 9.4  MG 2.0 1.9 1.8 1.7 1.5*  PHOS 5.8* 5.3* 4.8* 3.7 2.9   GFR: Estimated Creatinine Clearance: 18.5 mL/min (by C-G formula based on SCr of 3.28 mg/dL (H)). Liver Function Tests:  Recent Labs Lab 12/05/15 1500  12/07/15 0146 12/08/15 0226 12/09/15 0400 12/10/15 0555 12/11/15 0506  AST 35  --   --   --   --   --   --   ALT 23  --   --   --   --   --   --   ALKPHOS 52  --   --   --   --   --   --   BILITOT 0.8  --   --   --   --   --   --   PROT 7.1  --   --   --   --   --   --   ALBUMIN 3.3*  < > 2.3* 2.4* 2.3* 2.5* 2.6*  < > = values in this interval not displayed. No results for input(s): LIPASE, AMYLASE in the last 168 hours.  Recent Labs Lab 12/05/15 1733  AMMONIA 15   Coagulation Profile: No results for input(s): INR, PROTIME in the last 168 hours. Cardiac Enzymes:  Recent Labs Lab 12/05/15 2342 12/06/15 0042  CKTOTAL 1,501*  --   TROPONINI  --  0.13*   BNP (last 3 results) No results for input(s): PROBNP in the last 8760 hours. HbA1C: No results for input(s): HGBA1C in the last 72 hours. CBG:  Recent Labs Lab 12/10/15 1650 12/10/15 2010 12/11/15 0008 12/11/15 0425 12/11/15 0748  GLUCAP 192* 206* 106* 137* 135*   Lipid Profile: No results for input(s): CHOL, HDL, LDLCALC, TRIG, CHOLHDL, LDLDIRECT in the last 72 hours. Thyroid Function Tests: No results for  input(s): TSH, T4TOTAL, FREET4, T3FREE, THYROIDAB in the last 72 hours. Anemia Panel: No results for input(s): VITAMINB12, FOLATE, FERRITIN, TIBC, IRON, RETICCTPCT in the last 72 hours. Urine analysis:    Component Value Date/Time   COLORURINE YELLOW 12/07/2015 1130   APPEARANCEUR TURBID (A) 12/07/2015 1130   LABSPEC 1.013 12/07/2015 1130   PHURINE 6.0 12/07/2015 1130   GLUCOSEU NEGATIVE 12/07/2015 1130  GLUCOSEU NEGATIVE 07/03/2013 0856   HGBUR LARGE (A) 12/07/2015 1130   BILIRUBINUR NEGATIVE 12/07/2015 1130   BILIRUBINUR Neg 08/06/2014 1058   KETONESUR NEGATIVE 12/07/2015 1130   PROTEINUR 100 (A) 12/07/2015 1130   UROBILINOGEN 0.2 08/06/2014 1058   UROBILINOGEN 1.0 07/26/2014 1804   NITRITE NEGATIVE 12/07/2015 1130   LEUKOCYTESUR MODERATE (A) 12/07/2015 1130   Sepsis Labs: @LABRCNTIP (procalcitonin:4,lacticidven:4)    Urine culture     Status: Abnormal   Collection Time: 12/05/15  2:48 PM  Result Value Ref Range Status   Specimen Description URINE, RANDOM  Final   Special Requests NONE  Final   Report Status 12/07/2015 FINAL  Final   Organism ID, Bacteria ENTEROCOCCUS FAECALIS (A)  Final      Susceptibility   Enterococcus faecalis - MIC*    AMPICILLIN <=2 SENSITIVE Sensitive     LEVOFLOXACIN 1 SENSITIVE Sensitive     NITROFURANTOIN <=16 SENSITIVE Sensitive     VANCOMYCIN 1 SENSITIVE Sensitive     * 40,000 COLONIES/mL ENTEROCOCCUS FAECALIS  Blood Culture (routine x 2)     Status: None   Collection Time: 12/05/15  3:00 PM  Result Value Ref Range Status   Specimen Description BLOOD RIGHT HAND  Final   Special Requests IN PEDIATRIC BOTTLE 5CC  Final   Culture   Final    NO GROWTH 5 DAYS    Report Status 12/10/2015 FINAL  Final  Blood Culture (routine x 2)     Status: None   Collection Time: 12/05/15  5:00 PM  Result Value Ref Range Status   Specimen Description BLOOD LEFT FOREARM  Final   Culture   Final    NO GROWTH 5 DAYS   Report Status 12/10/2015 FINAL  Final   MRSA PCR Screening     Status: None   Collection Time: 12/05/15  8:10 PM  Result Value Ref Range Status   MRSA by PCR NEGATIVE NEGATIVE Final      Radiology Studies: Mr Lumbar Spine Wo Contrast Result Date: 12/06/2015 1. Stable MR appearance of the lumbar spine since the recent examination from 11/23/2015. Specific findings as discussed above. 2. Stable postoperative fluid collection extending from L2-L4. 3. Stable moderate-sized disc extrusion at T12-L1. 4. No definite MR findings to suggest diskitis or osteomyelitis.   [place] Renal Result Date: 12/06/2015 Normal renal ultrasound.  No hydronephrosis.    Scheduled Meds: . amLODipine  10 mg Oral Daily  . hydrALAZINE  50 mg Oral Q8H  . insulin aspart  0-9 Units Subcutaneous Q4H  . insulin glargine  10 Units Subcutaneous Daily  . rosuvastatin  40 mg Oral Q breakfast   Continuous Infusions:   LOS: 6 days    Time spent: 15 minutes  Greater than 50% of the time spent on counseling and coordinating the care.   Manson PasseyEVINE, Ezmeralda Stefanick, MD [facility] Pager [phone]  If 7PM-7AM, please contact night-coverage [email or web address] Password [email or web address] 12/11/2015, 10:44 AM

## 2015-12-12 DIAGNOSIS — I10 Essential (primary) hypertension: Secondary | ICD-10-CM

## 2015-12-12 DIAGNOSIS — E876 Hypokalemia: Secondary | ICD-10-CM

## 2015-12-12 LAB — RENAL FUNCTION PANEL
Albumin: 2.8 g/dL — ABNORMAL LOW (ref 3.5–5.0)
Anion gap: 7 (ref 5–15)
BUN: 24 mg/dL — AB (ref 6–20)
CHLORIDE: 109 mmol/L (ref 101–111)
CO2: 22 mmol/L (ref 22–32)
CREATININE: 2.88 mg/dL — AB (ref 0.44–1.00)
Calcium: 9.6 mg/dL (ref 8.9–10.3)
GFR calc Af Amer: 18 mL/min — ABNORMAL LOW (ref >60)
GFR, EST NON AFRICAN AMERICAN: 16 mL/min — AB (ref >60)
GLUCOSE: 140 mg/dL — AB (ref 65–99)
POTASSIUM: 3.5 mmol/L (ref 3.5–5.1)
Phosphorus: 3.1 mg/dL (ref 2.5–4.6)
Sodium: 138 mmol/L (ref 135–145)

## 2015-12-12 LAB — CBC
HEMATOCRIT: 27.7 % — AB (ref 36.0–46.0)
HEMOGLOBIN: 8.8 g/dL — AB (ref 12.0–15.0)
MCH: 27.6 pg (ref 26.0–34.0)
MCHC: 31.8 g/dL (ref 30.0–36.0)
MCV: 86.8 fL (ref 78.0–100.0)
Platelets: 226 10*3/uL (ref 150–400)
RBC: 3.19 MIL/uL — ABNORMAL LOW (ref 3.87–5.11)
RDW: 12.6 % (ref 11.5–15.5)
WBC: 11.2 10*3/uL — ABNORMAL HIGH (ref 4.0–10.5)

## 2015-12-12 LAB — BASIC METABOLIC PANEL
Anion gap: 8 (ref 5–15)
BUN: 25 mg/dL — AB (ref 6–20)
CALCIUM: 9.5 mg/dL (ref 8.9–10.3)
CHLORIDE: 109 mmol/L (ref 101–111)
CO2: 22 mmol/L (ref 22–32)
CREATININE: 2.81 mg/dL — AB (ref 0.44–1.00)
GFR calc Af Amer: 19 mL/min — ABNORMAL LOW (ref >60)
GFR calc non Af Amer: 16 mL/min — ABNORMAL LOW (ref >60)
GLUCOSE: 125 mg/dL — AB (ref 65–99)
Potassium: 3.6 mmol/L (ref 3.5–5.1)
Sodium: 139 mmol/L (ref 135–145)

## 2015-12-12 LAB — GLUCOSE, CAPILLARY
GLUCOSE-CAPILLARY: 129 mg/dL — AB (ref 65–99)
GLUCOSE-CAPILLARY: 177 mg/dL — AB (ref 65–99)
GLUCOSE-CAPILLARY: 178 mg/dL — AB (ref 65–99)
Glucose-Capillary: 137 mg/dL — ABNORMAL HIGH (ref 65–99)
Glucose-Capillary: 225 mg/dL — ABNORMAL HIGH (ref 65–99)

## 2015-12-12 LAB — MAGNESIUM: MAGNESIUM: 1.7 mg/dL (ref 1.7–2.4)

## 2015-12-12 MED ORDER — POTASSIUM CHLORIDE ER 20 MEQ PO TBCR: 20.0000 meq | EXTENDED_RELEASE_TABLET | Freq: Every day | ORAL | 0 refills | Status: DC

## 2015-12-12 MED ORDER — INSULIN GLARGINE 100 UNIT/ML SOLOSTAR PEN: 10.0000 [IU] | PEN_INJECTOR | Freq: Every day | SUBCUTANEOUS | 2 refills | Status: AC

## 2015-12-12 MED ORDER — POTASSIUM CHLORIDE ER 10 MEQ PO TBCR: 10.0000 meq | EXTENDED_RELEASE_TABLET | Freq: Every day | ORAL | 0 refills | Status: DC

## 2015-12-12 MED ORDER — INSULIN ASPART 100 UNIT/ML FLEXPEN: PEN_INJECTOR | SUBCUTANEOUS | 3 refills | Status: DC

## 2015-12-12 NOTE — Discharge Summary (Addendum)
Physician Discharge Summary  Heidi Campbell EAV:[id number] DOB: 21-Oct-1945 DOA: 12/05/2015  PCP: Astrid Divine, MD   Admit date: 12/05/2015 Discharge date: 12/12/2015  Recommendations for Outpatient Follow-up:  Please note we made changes to insulin regimen, Lantus 10 units at bedtime instead of patient's regular dose at home 34 units at bedtime. Patient also takes NovoLog 15 units twice a day but we changed that to 5 units twice a day. Please increase insulin dose if needed if CBGs are not controlled on current regimen.  Please recheck creatinine next week and please send the results to [facility]. Dr. Eliott Nine of renal will see the pt 11/20 at 11 am in office.   Please note the patient is on low-dose potassium supplementation. Continue to recheck potassium level along with creatinine to make sure it is stable. Specifically for potassium that it is within normal limits.  Please note we are temporarily holding aspirin until creatinine closer to normal values.    Discharge Diagnoses:  Principal Problem:   Acute renal failure (HCC) Active Problems:   Type 2 diabetes mellitus treated with insulin (HCC)   Hypertension   Obesity   Hyperlipidemia   Pressure injury of skin   Somnolence    Discharge Condition: stable   Diet recommendation: as tolerated   History of present illness:  70 y.o.with a history of diabetes mellitus, hypertension, hyperlipidemia, CAD, recent back surgery (L3-4 posterior lumbar interbody fusion back) who presented from with altered mental status and found to have acute renal failure with hyperkalemia and likely wound infection. Family also reported nausea and vomiting for 1 week PTA and decreased urine output.   Hospital Course:   Assessment & Plan:  Acute renal failure superimposed on CKD stage 3 - Appears secondary to volume depletion (GI losses) with hypotension in the setting of ARB - Baseline Cr 1.39 about 7 months ago  - Pt  underwent emergency hemodialysis for critical hyperkalemia overnight 11/3 - Nephrology has set up an appointment 12/22/2015 for office visit - Cr continues to improve   Right hand swelling - Likely secondary to IV infiltration. Has associated sensitivity - Improving   Hyperkalemia / Hypokalemia - Hyperkalemia improved with dialysis and V[place]assa - Subsequently hypokalemic which was supplemented and potassium now WNL  Wound infection / S/p bilateral L3/L4 decompressive laminectomy and foraminotomy - At the site of previous surgical incision. MRI significant for persistent post-op fluid collection with no evidence of discitis or osteomyelitis.  - Neurosurgery recommendations: wound not likely source of infection  UTI / Leukocytosis  - Enterococcus faecalis on culture - Has received vanco, no fevers and white blood cell count almost within normal limits so no further antibiotics on discharge  Acute metabolic encephalopathy  - Likely due to electrolyte abnormalities - Mental status better   Hypertension, essential - Continue Norvasc and hydralazine   Dyslipidemia associated with type 2 DM - Continue Crestor   Diabetes mellitus with peripheral circulatory disease with long term insulin use - Currently on Lantus 10 units daily and novolog 5 units BID  Anemia of chronic kidney disease  - Hgb 8.8 - No reports of bleeding    DVT prophylaxis:SCDs Code Status:Full code Family Communication:Family not at the bedside this am   Consultants:  Nephrology  Neurosurgery  Procedures:  Dialysis  Antimicrobials:   Ceftriaxone (113>11/4)  Zosyn (11/4>>11/6)  Vancomycin 11/3 --> 12/11/2015    Signed:  Manson Passey, MD  [facility] 12/12/2015, 11:23 AM  Pager #: [phone]  Time spent in  minutes: less than 30 minutes    Discharge Exam: Vitals:   12/12/15 0423 12/12/15 1047  BP: (!) 145/53 (!) 168/67  Pulse: 78 87  Resp: 18 17  Temp:  98.6 F (37 C) 98.1 F (36.7 C)   Vitals:   12/11/15 1713 12/11/15 2033 12/12/15 0423 12/12/15 1047  BP: (!) 171/63 (!) 154/65 (!) 145/53 (!) 168/67  Pulse: 87 80 78 87  Resp: 16 17 18 17   Temp: 98.4 F (36.9 C) 98.8 F (37.1 C) 98.6 F (37 C) 98.1 F (36.7 C)  TempSrc: Oral Oral Oral Oral  SpO2: 100% 95% 96% 99%  Weight:  100.3 kg (221 lb 1.6 oz)    Height:        General: Pt is alert, follows commands appropriately, not in acute distress Cardiovascular: Regular rate and rhythm, S1/S2 +, no murmurs Respiratory: Clear to auscultation bilaterally, no wheezing, no crackles, no rhonchi Abdominal: Soft, non tender, non distended, bowel sounds +, no guarding Extremities: no cyanosis, pulses palpable bilaterally DP and PT Neuro: Grossly nonfocal  Discharge Instructions  Discharge Instructions    Call MD for:  persistant nausea and vomiting    Complete by:  As directed    Call MD for:  redness, tenderness, or signs of infection (pain, swelling, redness, odor or green/yellow discharge around incision site)    Complete by:  As directed    Call MD for:  severe uncontrolled pain    Complete by:  As directed    Diet - low sodium heart healthy    Complete by:  As directed    Discharge instructions    Complete by:  As directed    Please note we made changes to insulin regimen, Lantus 10 units at bedtime instead of patient's regular dose at home 34 units at bedtime. Patient also takes NovoLog 15 units twice a day but we changed that to 5 units twice a day. Please increase insulin dose if needed if CBGs are not controlled on current regimen.   Increase activity slowly    Complete by:  As directed        Medication List    STOP taking these medications   anti-nausea solution   aspirin EC 81 MG tablet   cloNIDine 0.2 MG tablet Commonly known as:  CATAPRES   irbesartan-hydrochlorothiazide 300-12.5 MG tablet Commonly known as:  AVALIDE   liraglutide 18 MG/3ML Sopn Commonly  known as:  VICTOZA   metFORMIN 1000 MG tablet Commonly known as:  GLUCOPHAGE   methocarbamol 500 MG tablet Commonly known as:  ROBAXIN   sertraline 50 MG tablet Commonly known as:  ZOLOFT   spironolactone 50 MG tablet Commonly known as:  ALDACTONE     TAKE these medications   acetaminophen 325 MG tablet Commonly known as:  TYLENOL Take 325-650 mg by mouth 3 (three) times daily.   albuterol 108 (90 Base) MCG/ACT inhaler Commonly known as:  PROVENTIL HFA;VENTOLIN HFA Inhale 2 puffs into the lungs every 6 (six) hours as needed for wheezing or shortness of breath.   amLODipine 10 MG tablet Commonly known as:  NORVASC Take 1 tablet (10 mg total) by mouth daily. What changed:  when to take this   bisacodyl 10 MG suppository Commonly known as:  DULCOLAX Place 10 mg rectally as needed for moderate constipation (every 4 hours for constipation).   doxazosin 4 MG tablet Commonly known as:  CARDURA take 1 TABLET AT BEDTIME What changed:  how much to take  how  to take this  when to take this  additional instructions   ezetimibe 10 MG tablet Commonly known as:  ZETIA Take 1 tablet (10 mg total) by mouth daily. What changed:  when to take this   fluticasone 50 MCG/ACT nasal spray Commonly known as:  FLONASE Place 1 spray into both nostrils daily as needed for allergies.   Fluticasone-Salmeterol 100-50 MCG/DOSE Aepb Commonly known as:  ADVAIR Inhale 1 puff into the lungs 2 (two) times daily.   gabapentin 100 MG capsule Commonly known as:  NEURONTIN Take 200 mg by mouth 3 (three) times daily.   glucose blood test strip Commonly known as:  ONETOUCH VERIO TEST BLOOD SUGAR twice a day Dx code E11.9   hydrALAZINE 50 MG tablet Commonly known as:  APRESOLINE Take 1 tablet by mouth 3 times a day. What changed:  how much to take  how to take this  when to take this  additional instructions   insulin aspart 100 UNIT/ML FlexPen Commonly known as:  NOVOLOG  FLEXPEN Inject 5 units twice a day What changed:  additional instructions   Insulin Glargine 100 UNIT/ML Solostar Pen Commonly known as:  LANTUS Inject 10 Units into the skin daily at 10 pm. What changed:  how much to take   Insulin Pen Needle 32G X 4 MM Misc Commonly known as:  BD PEN NEEDLE NANO U/F Use 4 per day   omeprazole 20 MG capsule Commonly known as:  PRILOSEC Take 20 mg by mouth daily at 6 (six) AM.   ondansetron 4 MG disintegrating tablet Commonly known as:  ZOFRAN ODT Take 1 tablet (4 mg total) by mouth every 8 (eight) hours as needed for nausea or vomiting.   ONETOUCH DELICA LANCETS 33G Misc Use to check blood sugar twice a day dx code E11.9   polyethylene glycol packet Commonly known as:  MIRALAX / GLYCOLAX Take 17 g by mouth daily as needed for moderate constipation.   potassium chloride 10 MEQ tablet Commonly known as:  K-DUR Take 1 tablet (10 mEq total) by mouth daily.   rosuvastatin 40 MG tablet Commonly known as:  CRESTOR Take 1 tablet (40 mg total) by mouth daily. What changed:  when to take this        Follow-up Information    Astrid Divine, MD. Schedule an appointment as soon as possible for a visit in 2 week(s).   Specialty:  Family Medicine Contact information: [address] [place] [address] [phone]        Sadie Haber, MD Follow up on 12/22/2015.   Specialty:  Nephrology Why:  11 am  Contact information: [address] [place] [address] [phone]            The results of significant diagnostics from this hospitalization (including imaging, microbiology, ancillary and laboratory) are listed below for reference.    Significant Diagnostic Studies: Dg Chest 2 View  Result Date: 11/24/2015 CLINICAL DATA:  Fall 2 days ago. Left knee pain. Initial encounter. EXAM: CHEST  2 VIEW COMPARISON:  07/26/2014 FINDINGS: The cardiac silhouette is upper limits of normal in size. Aortic  atherosclerosis is noted. The patient has taken a shallower inspiration than on the prior study. Hazy increased density in the lung bases this attributed to overlying soft tissues. No focal airspace opacity, edema, pleural effusion, or pneumothorax is identified. No acute osseous abnormality is seen. IMPRESSION: 1. No active cardiopulmonary disease. 2. Aortic atherosclerosis. Electronically Signed   By: Sebastian Ache  M.D.   On: 11/24/2015 17:50   Mr Lumbar Spine Wo Contrast  Result Date: 12/06/2015 CLINICAL DATA:  Recent lumbar surgery with sepsis. EXAM: MRI LUMBAR SPINE WITHOUT CONTRAST TECHNIQUE: Multiplanar, multisequence MR imaging of the lumbar spine was performed. No intravenous contrast was administered. COMPARISON:  11/23/2015 FINDINGS: Segmentation: 5 lumbar type vertebral bodies. The last full intervertebral disc space is labeled L5-S1. This is consistent with the prior MRI. Alignment:  Normal Vertebrae: Mild stable endplate reactive changes. No findings suspicious for diskitis or osteomyelitis. Conus medullaris: Extends to the T12-L1. Level and appears normal. Paraspinal and other soft tissues: Stable postop fluid collection both superficial and deep to the muscle fascia. This could be a postoperative seroma or liquified hematoma. The it does not have the appearance of a CSF leak. Disc levels: T12-L1: Stable left-sided disc extrusion coursing both above and below the disc space. Moderate mass effect on the left side of thecal sac is stable. L1-2: No significant findings or interval change. L2-3:  No significant findings or interval change. L3-4: Posterior and interbody fusion changes. Wide decompressive laminectomy. No spinal or foraminal stenosis. Stable postoperative fluid collection as discussed above. L4-5: Stable bulging annulus and advanced facet disease with mild bilateral lateral recess stenosis. No significant spinal or foraminal stenosis. L5-S1: Stable low disc disease and facet disease. No  focal disc protrusion, spinal or foraminal stenosis. IMPRESSION: 1. Stable MR appearance of the lumbar spine since the recent examination from 11/23/2015. Specific findings as discussed above. 2. Stable postoperative fluid collection extending from L2-L4. 3. Stable moderate-sized disc extrusion at T12-L1. 4. No definite MR findings to suggest diskitis or osteomyelitis. Electronically Signed   By: Rudie Meyer M.D.   On: 12/06/2015 16:32   Mr Lumbar Spine W Wo Contrast  Result Date: 11/23/2015 CLINICAL DATA:  Gradually worsening 8/10 low back pain radiating to bilateral lower extremities for 2 days. Status post L3-4 PLIF November 07, 2015. EXAM: MRI LUMBAR SPINE WITHOUT AND WITH CONTRAST TECHNIQUE: Multiplanar and multiecho pulse sequences of the lumbar spine were obtained without and with intravenous contrast. CONTRAST:  20mL MULTIHANCE GADOBENATE DIMEGLUMINE 529 MG/ML IV SOLN COMPARISON:  MRI lumbar spine October 11, 2015 FINDINGS: SEGMENTATION: For the purposes of this report, the last well-formed intervertebral disc will be described as L5-S1. ALIGNMENT: Maintenance of the lumbar lordosis. No malalignment. VERTEBRAE:Status post L3-4 PLIF, pedicle screws result local susceptibility artifact. Restored L3-4 disc height with disc prosthesis. Stable moderate L5-S1 disc height loss and decreased T2 signal within the lumbar disc compatible with mild desiccation. Moderate subacute on chronic discogenic endplate change L5-S1. No suspicious osseous or intradiscal enhancement. CONUS MEDULLARIS: Conus medullaris terminates at L1-2 and demonstrates normal morphology and signal characteristics. Cauda equina is normal. No abnormal cord, leptomeningeal or epidural enhancement. PARASPINAL AND SOFT TISSUES: 2.9 x 3.1 x 9.2 cm (transverse by AP by CC) paraspinal fluid collection extending to the L3-4 surgical bed with smooth and thin peripheral enhancement. Scattered fluid fluid levels within the collection. Moderate symmetric  paraspinal muscle denervation below the the level of surgical intervention. DISC LEVELS: T12-L1: Chronic LEFT central broad-based disc extrusion measuring up to 5 mm in AP dimension with contiguous 18 mm of superior and inferior migration, minimally enhancing granulation tissue. Mild ventral thecal sac effacement without canal stenosis. No neural foraminal narrowing. L1-2: Tiny central disc protrusion no canal stenosis or neural foraminal narrowing. L2-3: No disc bulge. Mild facet arthropathy without canal stenosis or neural foraminal narrowing. L3-4: PLIF, posterior decompression. Fluid collection extend to  the laminectomy surgical beds without thecal sac deformity. No canal stenosis. Neural foramina predominately obscured. L4-5: Small broad-based disc bulge. Moderate RIGHT and severe LEFT facet arthropathy and ligamentum flavum redundancy. Nonenhancing trace RIGHT facet effusion is likely reactive. No canal stenosis. Moderate stable neural foraminal narrowing. L5-S1: Small broad-based disc bulge. Severe RIGHT and moderate LEFT facet arthropathy. RIGHT laminectomy suspected. No canal stenosis. Moderate RIGHT and mild LEFT neural foraminal narrowing. IMPRESSION: Status post recent L3-4 PLIF. 2.9 x 3.1 by IX 0.2 cm paraspinal hematoma/seroma extending to surgical bed without convincing evidence of infection. Old suspected RIGHT L5-S1 laminectomy. Old T12-L1 moderate disc extrusion. No canal stenosis. Similar moderate L4-5 and L5-S1 neural foraminal narrowing. Electronically Signed   By: Awilda Metroourtnay  Bloomer M.D.   On: 11/23/2015 03:53   [place] Renal  Result Date: 12/06/2015 CLINICAL DATA:  Low urine output.  Acute renal failure. EXAM: RENAL / URINARY TRACT ULTRASOUND COMPLETE COMPARISON:  CT, 12/04/2011 FINDINGS: Right Kidney: Length: 9.9 cm. Echogenicity within normal limits. No mass or hydronephrosis visualized. Left Kidney: Length: 9.9 cm. Echogenicity within normal limits. No mass or hydronephrosis visualized.  Bladder: Decompressed with a Foley catheter. IMPRESSION: Normal renal ultrasound.  No hydronephrosis. Electronically Signed   By: Amie Portlandavid  Ormond M.D.   On: 12/06/2015 14:59   Dg Chest Port 1 View  Result Date: 12/06/2015 CLINICAL DATA:  Central line placement.  Initial encounter. EXAM: PORTABLE CHEST 1 VIEW COMPARISON:  Chest radiograph performed 12/05/2015 FINDINGS: The patient's right-sided dual-lumen catheter is noted ending at the right cavoatrial junction. Mild vascular congestion is noted. Mild bibasilar atelectasis is seen. No pleural effusion or pneumothorax identified. The cardiomediastinal silhouette is mildly enlarged. No acute osseous abnormalities are identified. IMPRESSION: 1. Right-sided dual-lumen catheter noted ending about the right cavoatrial junction. 2. Mild vascular congestion and mild cardiomegaly. Mild bibasilar atelectasis seen. Electronically Signed   By: Roanna RaiderJeffery  Chang M.D.   On: 12/06/2015 02:37   Dg Chest Port 1 View  Result Date: 12/05/2015 CLINICAL DATA:  Unresponsive, hypotensive, bradycardia, history asthma, hypertension, diabetes mellitus EXAM: PORTABLE CHEST 1 VIEW COMPARISON:  Portable exam 1451 hours compared to 11/24/2015 FINDINGS: Enlargement of cardiac silhouette. Atherosclerotic calcification aorta. Mediastinal contours and pulmonary vascularity normal. Mild RIGHT basilar atelectasis. Lungs otherwise clear. No pleural effusion or pneumothorax. IMPRESSION: Enlargement of cardiac silhouette with mild RIGHT basilar atelectasis. Aortic atherosclerosis. Electronically Signed   By: Ulyses SouthwardMark  Boles M.D.   On: 12/05/2015 15:49   Dg Knee Complete 4 Views Left  Result Date: 11/24/2015 CLINICAL DATA:  Left knee pain after fall. EXAM: LEFT KNEE - COMPLETE 4+ VIEW COMPARISON:  None. FINDINGS: No fracture, dislocation or joint effusion in the left knee. Small superior left patellar enthesophyte. 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 No radiopaque foreign body. IMPRESSION: 1. No fracture, joint effusion or malalignment. 2. Mild-to-moderate tricompartmental left knee osteoarthritis, most prominent in the medial compartment. Electronically Signed   By: Delbert PhenixJason A Poff M.D.   On: 11/24/2015 17:49    Microbiology: Recent Results (from the past 240 hour(s))  Urine culture     Status: Abnormal   Collection Time: 12/05/15  2:48 PM  Result Value Ref Range Status   Specimen Description URINE, RANDOM  Final   Special Requests NONE  Final   Culture 40,000 COLONIES/mL ENTEROCOCCUS FAECALIS (A)  Final   Report Status 12/07/2015 FINAL  Final   Organism ID, Bacteria ENTEROCOCCUS FAECALIS (A)  Final      Susceptibility   Enterococcus  faecalis - MIC*    AMPICILLIN <=2 SENSITIVE Sensitive     LEVOFLOXACIN 1 SENSITIVE Sensitive     NITROFURANTOIN <=16 SENSITIVE Sensitive     VANCOMYCIN 1 SENSITIVE Sensitive     * 40,000 COLONIES/mL ENTEROCOCCUS FAECALIS  Blood Culture (routine x 2)     Status: None   Collection Time: 12/05/15  3:00 PM  Result Value Ref Range Status   Specimen Description BLOOD RIGHT HAND  Final   Special Requests IN PEDIATRIC BOTTLE 5CC  Final   Culture   Final    NO GROWTH 5 DAYS Performed at [facility]    Report Status 12/10/2015 FINAL  Final  Blood Culture (routine x 2)     Status: None   Collection Time: 12/05/15  5:00 PM  Result Value Ref Range Status   Specimen Description BLOOD LEFT FOREARM  Final   Special Requests BOTTLES DRAWN AEROBIC AND ANAEROBIC 6CC  Final   Culture   Final    NO GROWTH 5 DAYS Performed at [facility]    Report Status 12/10/2015 FINAL  Final  MRSA PCR Screening     Status: None   Collection Time: 12/05/15  8:10 PM  Result Value Ref Range Status   MRSA by PCR NEGATIVE NEGATIVE Final    Comment:        The GeneXpert MRSA Assay (FDA approved for NASAL specimens only), is one component of a comprehensive MRSA colonization surveillance  program. It is not intended to diagnose MRSA infection nor to guide or monitor treatment for MRSA infections.      Labs: Basic Metabolic Panel:  Recent Labs Lab 12/08/15 0226 12/09/15 0400 12/10/15 0555 12/11/15 0506 12/12/15 0500 12/12/15 0546  NA 135 139 138 139 139 138  K 4.1 3.3* 3.2* 3.8 3.6 3.5  CL 98* 101 105 111 109 109  CO2 23 26 24  20* 22 22  GLUCOSE 109* 84 114* 136* 125* 140*  BUN 52* 41* 29* 23* 25* 24*  CREATININE 6.09* 5.23* 4.07* 3.28* 2.81* 2.88*  CALCIUM 8.4* 9.0 9.4 9.4 9.5 9.6  MG 1.9 1.8 1.7 1.5* 1.7  --   PHOS 5.3* 4.8* 3.7 2.9  --  3.1   Liver Function Tests:  Recent Labs Lab 12/05/15 1500  12/08/15 0226 12/09/15 0400 12/10/15 0555 12/11/15 0506 12/12/15 0546  AST 35  --   --   --   --   --   --   ALT 23  --   --   --   --   --   --   ALKPHOS 52  --   --   --   --   --   --   BILITOT 0.8  --   --   --   --   --   --   PROT 7.1  --   --   --   --   --   --   ALBUMIN 3.3*  < > 2.4* 2.3* 2.5* 2.6* 2.8*  < > = values in this interval not displayed. No results for input(s): LIPASE, AMYLASE in the last 168 hours.  Recent Labs Lab 12/05/15 1733  AMMONIA 15   CBC:  Recent Labs Lab 12/05/15 1500  12/06/15 0730 12/08/15 0226 12/10/15 0555 12/11/15 0506 12/12/15 0500  WBC 10.6*  < > 12.7* 10.8* 11.3* 11.8* 11.2*  NEUTROABS 7.9*  --   --   --   --   --   --  HGB 9.2*  < > 8.2* 8.7* 9.1* 9.3* 8.8*  HCT 28.5*  < > 25.7* 27.0* 28.0* 28.5* 27.7*  MCV 88.0  < > 86.2 86.8 85.6 85.6 86.8  PLT 277  < > 261 252 242 246 226  < > = values in this interval not displayed. Cardiac Enzymes:  Recent Labs Lab 12/05/15 2342 12/06/15 0042  CKTOTAL 1,501*  --   TROPONINI  --  0.13*   BNP: BNP (last 3 results)  Recent Labs  12/06/15 0042  BNP 187.8*    ProBNP (last 3 results) No results for input(s): PROBNP in the last 8760 hours.  CBG:  Recent Labs Lab 12/11/15 1632 12/11/15 2032 12/12/15 0018 12/12/15 0422 12/12/15 0746   GLUCAP 170* 187* 177* 129* 137*

## 2015-12-12 NOTE — Progress Notes (Signed)
Occupational Therapy Treatment Patient Details Name: Heidi Campbell MRN: [id number] DOB: 16-Mar-1945 Today's Date: 12/12/2015    History of present illness 70 y.o. with a history of diabetes mellitus, hypertension, hyperlipidemia, CAD, recent back surgery. Patient presented with altered mental status and found to have acute renal failure with hyperkalemia and likely wound infection.   OT comments  Pt moving slowly, requiring less assist for bed mobility and sit to stand, but continues to need total assist for LB ADL, pericare and donning brace. SNF continue to be the optimal d/c setting. Will continue to follow.  Follow Up Recommendations  SNF;Supervision/Assistance - 24 hour    Equipment Recommendations       Recommendations for Other Services      Precautions / Restrictions Precautions Precautions: Back;Fall Precaution Comments: pt able to recall 2/3 back precautions, reeducated Required Braces or Orthoses: Spinal Brace Spinal Brace: Applied in sitting position Restrictions Weight Bearing Restrictions: No       Mobility Bed Mobility Overal bed mobility: Needs Assistance Bed Mobility: Rolling;Sidelying to Sit Rolling: Min assist Sidelying to sit: Min assist       General bed mobility comments: assist for LEs  Transfers Overall transfer level: Needs assistance Equipment used: Rolling walker (2 wheeled)   Sit to Stand: Min assist;From elevated surface         General transfer comment: min assist to rise, cues for hand placement    Balance     Sitting balance-Leahy Scale: Fair       Standing balance-Leahy Scale: Poor                     ADL Overall ADL's : Needs assistance/impaired     Grooming: Wash/dry hands;Wash/dry face;Sitting;Set up   Upper Body Bathing: Minimal assitance;Sitting   Lower Body Bathing: Total assistance;Sit to/from stand Lower Body Bathing Details (indicate cue type and reason): educated in use of long bath sponge Upper  Body Dressing : Minimal assistance;Sitting Upper Body Dressing Details (indicate cue type and reason): max assist for back brace Lower Body Dressing: Total assistance;Sit to/from stand       Toileting- ArchitectClothing Manipulation and Hygiene: Total assistance;Sit to/from stand                Vision                     Perception     Praxis      Cognition   Behavior During Therapy: [facility] for tasks assessed/performed;Flat affect Overall Cognitive Status: Impaired/Different from baseline Area of Impairment: Memory;Problem solving     Memory: Decreased recall of precautions        Problem Solving: Slow processing      Extremity/Trunk Assessment               Exercises     Shoulder Instructions       General Comments      Pertinent Vitals/ Pain       Pain Assessment: Faces Faces Pain Scale: Hurts a little bit Pain Location: back Pain Descriptors / Indicators: Grimacing Pain Intervention(s): Monitored during session;Repositioned  Home Living                                          Prior Functioning/Environment              Frequency  Min 2X/week  Progress Toward Goals  OT Goals(current goals can now be found in the care plan section)  Progress towards OT goals: Not progressing toward goals - comment (pt self limits)  Acute Rehab OT Goals Patient Stated Goal: Need to get strong at rehab before home Time For Goal Achievement: 12/17/15 Potential to Achieve Goals: Good  Plan Discharge plan remains appropriate    Co-evaluation                 End of Session Equipment Utilized During Treatment: Rolling walker;Back brace   Activity Tolerance Patient tolerated treatment well   Patient Left in chair;with call bell/phone within reach   Nurse Communication          Time: [id number] OT Time Calculation (min): 38 min  Charges: OT General Charges $OT Visit: 1 Procedure OT Treatments $Self Care/Home  Management : 38-52 mins  Evern BioMayberry, Yannet Rincon Lynn 12/12/2015, 10:31 AM  [phone]

## 2015-12-12 NOTE — Progress Notes (Signed)
Foley pulled and HD IJ catheter left in place per Dr Elisabeth Pigeonevine.

## 2015-12-12 NOTE — Final Progress Note (Signed)
IV access was d/c'd. Vitals are stable. Skin is intact except as charted in most recent assessments. Pt to be escorted out by ambulance team, to be driven [facility].  [facility]-R[facility].  Wound cleaned, bandage changed.

## 2015-12-12 NOTE — Clinical Social Work Note (Addendum)
**  Patient discharged [facility] earlier this evening, transported by ambulance. Daughter Westley ChandlerCoshenda was at the bedside and aware of discharge and ambulance transport.**  Clinical Social Work Assessment  Patient Details  Name: Heidi Campbell MRN: [id number] Date of Birth: November 26, 1945  Date of referral:  12/12/15               Reason for consult:  Facility Placement                Permission sought to share information with:  Family Supports Permission granted to share information::  Yes, Verbal Permission Granted  Name::     Naval architectCoshena Okey  Agency::     Relationship::  Daughter  Contact Information:  [phone]  Housing/Transportation Living arrangements for the past 2 months:  Skilled Nursing Facility (Patient [facility] hospital [facility]) Source of Information:  Patient Patient Interpreter Needed:  None Criminal Activity/Legal Involvement Pertinent to Current Situation/Hospitalization:  No - Comment as needed Significant Relationships:  Adult Children Lives with:  Facility Resident (At home patient lives with her son Kern AlbertaRitchie Senk) Do you feel safe going back to the place where you live?  Yes (Patient feels safe at home, but understands and agrees that continued rehab needed before she can return home.) Need for family participation in patient care:  Yes (Comment)  Care giving concerns:  Patient [facility] where she is receiving rehab. Ms. Suzie PortelaMoffitt indicated that she plans to return [facility] to [facility].  Social Worker assessment / plan:  CSW talked with patient at the bedside. Ms. Suzie PortelaMoffitt was alert, oriented and was sitting up in a chair at bedside. Patient was open to talking with CSW regarding her discharge plans. Ms. Suzie PortelaMoffitt reported that she lives with her son and has two daughters; one daughter lives in [place] and the other daughter lives in [place].  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  Patient/Family's Understanding of and Emotional Response to Diagnosis, Current Treatment, and Prognosis:  Not discussed.  Emotional Assessment Appearance:  Appears younger than stated age Attitude/Demeanor/Rapport:  Other (Appropriate) Affect (typically observed):  Appropriate, Pleasant Orientation:  Oriented to Self, Oriented to Place, Oriented to  Time, Oriented to Situation Alcohol / Substance use:  Never Used Psych involvement (Current and /or in the community):  No (Comment)  Discharge Needs  Concerns to be addressed:  Discharge Planning Concerns Readmission within the last 30 days:  Yes Current discharge risk:  None Barriers to Discharge:  No Barriers Identified   Cristobal GoldmannCrawford, Diante Barley Bradley, LCSW 12/12/2015, 11:12 PM

## 2015-12-12 NOTE — Care Management Important Message (Signed)
Important Message  Patient Details  Name: Heidi Campbell MRN: [id number] Date of Birth: 05-Dec-1945   Medicare Important Message Given:  Yes    Pier Laux 12/12/2015, 12:12 PM

## 2015-12-12 NOTE — NC FL2 (Signed)
[place] MEDICAID FL2 LEVEL OF CARE SCREENING TOOL     IDENTIFICATION  Patient Name: Heidi Campbell Birthdate: 11-Aug-1945 Sex: female Admission Date (Current Location): 12/05/2015  [facility] [place]   Facility and Address:  [facility] [place], [address], [place], [place] [address]      Provider Number: [id number]  Attending Physician Name and Address:  Alison MurrayAlma M Devine, MD  Relative Name and Phone Number:  Marich,Ritchie-Son; [phone] (h)  [phone] (mobile);  Martine,Adonica-Daughter; [phone]     Current Level of Care: Hospital Recommended Level of Care: Skilled Nursing Facility [facility] Prior Approval Number:    Date Approved/Denied:   PASRR Number: [id number] A (Eff. 11/24/15)  Discharge Plan: SNF    Current Diagnoses: Patient Active Problem List   Diagnosis Date Noted  . Somnolence   . Acute renal failure (HCC) 12/05/2015  . Pressure injury of skin 12/05/2015  . Degenerative spondylolisthesis 11/07/2015  . Carotid artery disease (HCC) 06/20/2014  . Bradycardia 10/12/2013  . Fatigue 10/12/2013  . Hyperlipidemia 05/14/2013  . Type II or unspecified type diabetes mellitus without mention of complication, uncontrolled 02/19/2013  . Coronary atherosclerosis of native coronary artery 12/22/2012  . Obesity 12/22/2012  . Hypertension   . SINUSITIS, ACUTE 04/01/2008  . Type 2 diabetes mellitus treated with insulin (HCC) 02/23/2007  . ALLERGIC RHINITIS 02/23/2007  . ASTHMA 02/23/2007  . Esophageal reflux 02/23/2007    Orientation RESPIRATION BLADDER Height & Weight     Self, Time, Situation  Normal Incontinent, Indwelling catheter (Urethral catheter for acute urinary retention) Weight: 221 lb 1.6 oz (100.3 kg) Height:  5\' 3"  (160 cm)  BEHAVIORAL SYMPTOMS/MOOD NEUROLOGICAL BOWEL NUTRITION STATUS      Continent Diet (Low sodium - Heart healthy)  AMBULATORY STATUS COMMUNICATION OF NEEDS  Skin   Extensive Assist (Patient unable to ambulate with PT on 11/8 due to discomfort) Verbally Other (Comment) (Stage 2 pressure ulcer to medial, mid-sacrum )                       Personal Care Assistance Level of Assistance  Bathing, Feeding, Dressing Bathing Assistance: Maximum assistance Feeding assistance: Independent (Assistance with set-up) Dressing Assistance: Maximum assistance (Upper body min assist and lower body total assist)     Functional Limitations Info    Sight Info: Impaired Hearing Info: Impaired Speech Info: Adequate    SPECIAL CARE FACTORS FREQUENCY  PT (By licensed PT), OT (By licensed OT)     PT Frequency: Evaluated 11/8 and a minimum of 3X per week therapy recommended OT Frequency: Evaluated 11/8 and a minimum of 2X per week therapy recommended            Contractures Contractures Info: Not present    Additional Factors Info  Code Status, Allergies, Insulin Sliding Scale Code Status Info: Full Allergies Info: Constrast media, Latex, Ace Inhibitors, Shellfish allergy   Insulin Sliding Scale Info: 0-9 Units every 4 hours       Current Medications (12/12/2015):  This is the current hospital active medication list Current Facility-Administered Medications  Medication Dose Route Frequency Provider Last Rate Last Dose  . 0.9 %  sodium chloride infusion  100 mL Intravenous PRN Delano Metzobert Schertz, MD      . 0.9 %  sodium chloride infusion  100 mL Intravenous PRN Delano Metzobert Schertz, MD      . 0.9 %  sodium chloride infusion  250 mL Intravenous PRN Roslynn AmbleJennings E Nestor,  MD      . acetaminophen (TYLENOL) tablet 650 mg  650 mg Oral Q4H PRN Leanne ChangKatherine P Schorr, NP   650 mg at 12/11/15 2235  . alteplase (CATHFLO ACTIVASE) injection 2 mg  2 mg Intracatheter Once PRN Delano Metzobert Schertz, MD      . amLODipine (NORVASC) tablet 10 mg  10 mg Oral Daily Narda Bondsalph A Nettey, MD   10 mg at 12/12/15 1043  . heparin injection 1,000 Units  1,000 Units Dialysis PRN Delano Metzobert Schertz, MD    1,000 Units at 12/11/15 0615  . heparin injection 2,000 Units  2,000 Units Dialysis PRN Delano Metzobert Schertz, MD      . hydrALAZINE (APRESOLINE) tablet 50 mg  50 mg Oral Q8H Hillary Percell BostonMoen Fitzgerald, MD   50 mg at 12/12/15 0539  . insulin aspart (novoLOG) injection 0-9 Units  0-9 Units Subcutaneous Q4H Coralyn HellingVineet Sood, MD   2 Units at 12/12/15 1309  . insulin glargine (LANTUS) injection 10 Units  10 Units Subcutaneous Daily Narda Bondsalph A Nettey, MD   10 Units at 12/12/15 1044  . ipratropium-albuterol (DUONEB) 0.5-2.5 (3) MG/3ML nebulizer solution 3 mL  3 mL Nebulization Q2H PRN Coralyn HellingVineet Sood, MD      . lidocaine (PF) (XYLOCAINE) 1 % injection 5 mL  5 mL Intradermal PRN Delano Metzobert Schertz, MD      . lidocaine-prilocaine (EMLA) cream 1 application  1 application Topical PRN Delano Metzobert Schertz, MD      . ondansetron [facility](ZOFRAN) injection 4 mg  4 mg Intravenous Q6H PRN Leanne ChangKatherine P Schorr, NP   4 mg at 12/10/15 0106  . pentafluoroprop-tetrafluoroeth (GEBAUERS) aerosol 1 application  1 application Topical PRN Delano Metzobert Schertz, MD      . rosuvastatin (CRESTOR) tablet 40 mg  40 mg Oral Q breakfast Alison MurrayAlma M Devine, MD   40 mg at 12/12/15 [id number]     Discharge Medications: Please see discharge summary for a list of discharge medications.  Relevant Imaging Results:  Relevant Lab Results:   Additional Information ss#[id number]  Cristobal GoldmannCrawford, Angeleah Labrake Bradley, LCSW

## 2015-12-12 NOTE — Discharge Instructions (Signed)
Acute Kidney Injury °Acute kidney injury is any condition in which there is sudden (acute) damage to the kidneys. Acute kidney injury was previously known as acute kidney failure or acute renal failure. The kidneys are two organs that lie on either side of the spine between the middle of the back and the front of the abdomen. The kidneys: °· Remove wastes and extra water from the blood.   °· Produce important hormones. These help keep bones strong, regulate blood pressure, and help create red blood cells.   °· Balance the fluids and chemicals in the blood and tissues. °A small amount of kidney damage may not cause problems, but a large amount of damage may make it difficult or impossible for the kidneys to work the way they should. Acute kidney injury may develop into long-lasting (chronic) kidney disease. It may also develop into a life-threatening disease called end-stage kidney disease. Acute kidney injury can get worse very quickly, so it should be treated right away. Early treatment may prevent other kidney diseases from developing. °CAUSES  °· A problem with blood flow to the kidneys. This may be caused by:   °¨ Blood loss.   °¨ Heart disease.   °¨ Severe burns.   °¨ Liver disease. °· Direct damage to the kidneys. This may be caused by: °¨ Some medicines.   °¨ A kidney infection.   °¨ Poisoning or consuming toxic substances.   °¨ A surgical wound.   °¨ A blow to the kidney area.   °· A problem with urine flow. This may be caused by:   °¨ Cancer.   °¨ Kidney stones.   °¨ An enlarged prostate. °SIGNS AND SYMPTOMS  °· Swelling (edema) of the legs, ankles, or feet.   °· Tiredness (lethargy).   °· Nausea or vomiting.   °· Confusion.   °· Problems with urination, such as:   °¨ Painful or burning feeling during urination.   °¨ Decreased urine production.   °¨ Frequent accidents in children who are potty trained.   °¨ Bloody urine.   °· Muscle twitches and cramps.   °· Shortness of breath.   °· Seizures.   °· Chest  pain or pressure. °Sometimes, no symptoms are present.  °DIAGNOSIS °Acute kidney injury may be detected and diagnosed by tests, including blood, urine, imaging, or kidney biopsy tests.  °TREATMENT °Treatment of acute kidney injury varies depending on the cause and severity of the kidney damage. In mild cases, no treatment may be needed. The kidneys may heal on their own. If acute kidney injury is more severe, your health care provider will treat the cause of the kidney damage, help the kidneys heal, and prevent complications from occurring. Severe cases may require a procedure to remove toxic wastes from the body (dialysis) or surgery to repair kidney damage. Surgery may involve:  °· Repair of a torn kidney.   °· Removal of an obstruction. °HOME CARE INSTRUCTIONS °· Follow your prescribed diet. °· Take medicines only as directed by your health care provider.  °· Do not take any new medicines (prescription, over-the-counter, or nutritional supplements) unless approved by your health care provider. Many medicines can worsen your kidney damage or may need to have the dose adjusted.   °· Keep all follow-up visits as directed by your health care provider. This is important. °· Observe your condition to make sure you are healing as expected. °SEEK IMMEDIATE MEDICAL CARE IF: °· You are feeling ill or have severe pain in the back or side.   °· Your symptoms return or you have new symptoms. °· You have any symptoms of end-stage kidney disease. These include:   °¨ Persistent itchiness.   °¨   Loss of appetite.   °¨ Headaches.   °¨ Abnormally dark or light skin. °¨ Numbness in the hands or feet.   °¨ Easy bruising.   °¨ Frequent hiccups.   °¨ Menstruation stops.   °· You have a fever. °· You have increased urine production. °· You have pain or bleeding when urinating. °MAKE SURE YOU:  °· Understand these instructions. °· Will watch your condition. °· Will get help right away if you are not doing well or get worse. °  °This  information is not intended to replace advice given to you by your health care provider. Make sure you discuss any questions you have with your health care provider. °  °Document Released: 08/03/2010 Document Revised: 02/08/2014 Document Reviewed: 09/17/2011 °Elsevier Interactive Patient Education ©2016 [employer] ° °

## 2015-12-12 NOTE — Progress Notes (Signed)
Contacted [facility] to arrange ambulance transport back to SNF [facility]  Peri MarisAndrew Dnyla Antonetti, MBA, BSN, RN

## 2015-12-16 ENCOUNTER — Emergency Department (HOSPITAL_COMMUNITY)
Admission: EM | Admit: 2015-12-16 | Discharge: 2015-12-16 | Disposition: A | Discharge status: Home / Self Care | Payer: Medicare Other | Source: Home / Self Care | Attending: Emergency Medicine | Admitting: Emergency Medicine

## 2015-12-16 ENCOUNTER — Encounter (HOSPITAL_COMMUNITY): Payer: Self-pay | Admitting: Emergency Medicine

## 2015-12-16 DIAGNOSIS — R079 Chest pain, unspecified: Secondary | ICD-10-CM | POA: Diagnosis not present

## 2015-12-16 DIAGNOSIS — E119 Type 2 diabetes mellitus without complications: Secondary | ICD-10-CM | POA: Insufficient documentation

## 2015-12-16 DIAGNOSIS — J45909 Unspecified asthma, uncomplicated: Secondary | ICD-10-CM | POA: Insufficient documentation

## 2015-12-16 DIAGNOSIS — I10 Essential (primary) hypertension: Secondary | ICD-10-CM

## 2015-12-16 DIAGNOSIS — Z79899 Other long term (current) drug therapy: Secondary | ICD-10-CM | POA: Insufficient documentation

## 2015-12-16 DIAGNOSIS — Z794 Long term (current) use of insulin: Secondary | ICD-10-CM | POA: Insufficient documentation

## 2015-12-16 DIAGNOSIS — I2510 Atherosclerotic heart disease of native coronary artery without angina pectoris: Secondary | ICD-10-CM | POA: Insufficient documentation

## 2015-12-16 DIAGNOSIS — Z452 Encounter for adjustment and management of vascular access device: Secondary | ICD-10-CM

## 2015-12-16 DIAGNOSIS — Z91040 Latex allergy status: Secondary | ICD-10-CM | POA: Insufficient documentation

## 2015-12-16 DIAGNOSIS — I214 Non-ST elevation (NSTEMI) myocardial infarction: Secondary | ICD-10-CM | POA: Diagnosis not present

## 2015-12-16 NOTE — ED Notes (Signed)
Physician paged

## 2015-12-16 NOTE — ED Provider Notes (Signed)
[facility]-EMERGENCY DEPT Provider Note   CSN: [id number] Arrival date & time: 12/16/15  1059     History   Chief Complaint Chief Complaint  Patient presents with  . catheter removal    HPI Abrie Perlie GoldV Ellerson is a 70 y.o. female.  [id number] year old female with past medical history of recent episode of acute renal failure requiring dialysis who presents for removal of her dialysis catheter.Patient currently in a nursing facility. She just had lab work returned and reportedly no longer needs her catheter. She was advised to report to the ED for removal by her nephrologist. Denies any complaints. Denies any trauma, redness, or pain to the area. She continues to feel improved after her recent admission.  Past Medical History:  Diagnosis Date  . ALLERGIC RHINITIS   . Arthritis   . ASTHMA   . Asthma   . Diabetes mellitus without complication (HCC)   . Esophageal reflux   . Hypertension   . SINUSITIS, ACUTE     Patient Active Problem List   Diagnosis Date Noted  . Somnolence   . Acute renal failure (HCC) 12/05/2015  . Pressure injury of skin 12/05/2015  . Degenerative spondylolisthesis 11/07/2015  . Carotid artery disease (HCC) 06/20/2014  . Bradycardia 10/12/2013  . Fatigue 10/12/2013  . Hyperlipidemia 05/14/2013  . Type II or unspecified type diabetes mellitus without mention of complication, uncontrolled 02/19/2013  . Coronary atherosclerosis of native coronary artery 12/22/2012  . Obesity 12/22/2012  . Hypertension   . SINUSITIS, ACUTE 04/01/2008  . Type 2 diabetes mellitus treated with insulin (HCC) 02/23/2007  . ALLERGIC RHINITIS 02/23/2007  . ASTHMA 02/23/2007  . Esophageal reflux 02/23/2007    Past Surgical History:  Procedure Laterality Date  . ABDOMINAL HYSTERECTOMY    . arthroscopic knee Right 2015  . BACK SURGERY    . BREAST REDUCTION SURGERY Bilateral 2006  . BREAST SURGERY    . HAND SURGERY Left    damaged nerve in hand    OB History    No data available       Home Medications    Prior to Admission medications   Medication Sig Start Date End Date Taking? Authorizing Provider  acetaminophen (TYLENOL) 325 MG tablet Take 325 mg by mouth 3 (three) times daily.    Yes Historical Provider, MD  albuterol (PROVENTIL HFA;VENTOLIN HFA) 108 (90 BASE) MCG/ACT inhaler Inhale 2 puffs into the lungs every 6 (six) hours as needed for wheezing or shortness of breath.   Yes Historical Provider, MD  amLODipine (NORVASC) 10 MG tablet Take 1 tablet (10 mg total) by mouth daily. 07/26/14  Yes Donnetta HutchingBrian Cook, MD  bisacodyl (DULCOLAX) 10 MG suppository Place 10 mg rectally every 4 (four) hours as needed for moderate constipation.    Yes Historical Provider, MD  doxazosin (CARDURA) 4 MG tablet take 1 TABLET AT BEDTIME Patient taking differently: Take 4 mg by mouth at bedtime.  04/15/15  Yes Reather LittlerAjay Kumar, MD  ezetimibe (ZETIA) 10 MG tablet Take 1 tablet (10 mg total) by mouth daily. Patient taking differently: Take 10 mg by mouth at bedtime.  07/20/13  Yes Reather LittlerAjay Kumar, MD  fluticasone (FLONASE) 50 MCG/ACT nasal spray Place 1 spray into both nostrils daily as needed for allergies.    Yes Historical Provider, MD  Fluticasone-Salmeterol (ADVAIR) 100-50 MCG/DOSE AEPB Inhale 1 puff into the lungs 2 (two) times daily.    Yes Historical Provider, MD  gabapentin (NEURONTIN) 400 MG capsule Take 400 mg by mouth 3 (three) times daily.  Yes Historical Provider, MD  glucose blood (ONETOUCH VERIO) test strip TEST BLOOD SUGAR twice a day Dx code E11.9 04/15/15  Yes Reather LittlerAjay Kumar, MD  hydrALAZINE (APRESOLINE) 50 MG tablet Take 1 tablet by mouth 3 times a day. Patient taking differently: Take 50 mg by mouth 3 (three) times daily.  10/17/14  Yes Corky CraftsJayadeep S Varanasi, MD  insulin aspart (NOVOLOG) 100 UNIT/ML injection Inject 5 Units into the skin 2 (two) times daily.   Yes Historical Provider, MD  Insulin Glargine (LANTUS) 100 UNIT/ML Solostar Pen Inject 10 Units into the skin daily at 10 pm. 12/12/15   Yes Alison MurrayAlma M Devine, MD  Insulin Pen Needle (BD PEN NEEDLE NANO U/F) 32G X 4 MM MISC Use 4 per day 04/15/15  Yes Reather LittlerAjay Kumar, MD  omeprazole (PRILOSEC) 20 MG capsule Take 20 mg by mouth daily at 6 (six) AM.    Yes Historical Provider, MD  ondansetron (ZOFRAN ODT) 4 MG disintegrating tablet Take 1 tablet (4 mg total) by mouth every 8 (eight) hours as needed for nausea or vomiting. 01/27/15  Yes Shawn C Joy, PA-C  ONETOUCH DELICA LANCETS 33G MISC Use to check blood sugar twice a day dx code E11.9 04/15/15  Yes Reather LittlerAjay Kumar, MD  polyethylene glycol (MIRALAX / GLYCOLAX) packet Take 17 g by mouth daily as needed (For constipation.).    Yes Historical Provider, MD  potassium chloride (K-DUR) 10 MEQ tablet Take 1 tablet (10 mEq total) by mouth daily. 12/12/15  Yes Alison MurrayAlma M Devine, MD  rosuvastatin (CRESTOR) 40 MG tablet Take 1 tablet (40 mg total) by mouth daily. Patient taking differently: Take 40 mg by mouth at bedtime.  12/22/12  Yes Corky CraftsJayadeep S Varanasi, MD    Family History Family History  Problem Relation Age of Onset  . Diabetes Mother   . Heart attack Father   . Heart disease Father     Social History Social History  Substance Use Topics  . Smoking status: Never Smoker  . Smokeless tobacco: Never Used  . Alcohol use No     Allergies   Contrast media [iodinated diagnostic agents]; Latex; Ace inhibitors; and Shellfish allergy   Review of Systems Review of Systems  Constitutional: Negative for chills and fever.  HENT: Negative for congestion, rhinorrhea and sore throat.   Eyes: Negative for visual disturbance.  Respiratory: Negative for cough, shortness of breath and wheezing.   Cardiovascular: Negative for chest pain and leg swelling.  Gastrointestinal: Negative for abdominal pain, diarrhea, nausea and vomiting.  Genitourinary: Negative for dysuria, flank pain, vaginal bleeding and vaginal discharge.  Musculoskeletal: Negative for neck pain.  Skin: Negative for rash.    Allergic/Immunologic: Negative for immunocompromised state.  Neurological: Negative for syncope and headaches.  Hematological: Does not bruise/bleed easily.  All other systems reviewed and are negative.    Physical Exam Updated Vital Signs BP 143/55 (BP Location: Left Arm)   Pulse 82   Temp 98.7 F (37.1 C) (Oral)   Resp 16   SpO2 99%   Physical Exam  Constitutional: She is oriented to person, place, and time. She appears well-developed and well-nourished. No distress.  HENT:  Head: Normocephalic and atraumatic.  Eyes: Conjunctivae are normal.  Neck: Neck supple.  Cardiovascular: Normal rate, regular rhythm and normal heart sounds.  Exam reveals no friction rub.   No murmur heard. Pulmonary/Chest: Effort normal and breath sounds normal. No respiratory distress. She has no wheezes. She has no rales.  Non-tunneled right internal jugular catheter is clean,  dry, and intact.  Abdominal: She exhibits no distension.  Musculoskeletal: She exhibits no edema.  Neurological: She is alert and oriented to person, place, and time.  Skin: Skin is warm. Capillary refill takes less than 2 seconds.  Psychiatric: She has a normal mood and affect.  Nursing note and vitals reviewed.    ED Treatments / Results  Labs (all labs ordered are listed, but only abnormal results are displayed) Labs Reviewed - No data to display  EKG  EKG Interpretation None       Radiology No results found.  Procedures Procedures (including critical care time)  Medications Ordered in ED Medications - No data to display   Initial Impression / Assessment and Plan / ED Course  I have reviewed the triage vital signs and the nursing notes.  Pertinent labs & imaging results that were available during my care of the patient were reviewed by me and considered in my medical decision making (see chart for details).  Clinical Course     70 year old female with past medical history as above here for removal  of right dialysis catheter. Confirmed with Dr. Arlean Hopping of neurology the patient is to have catheter removed. IV team consulted and catheter removed uneventfully. Patient monitored for 20 minutes after removal with no recurrence of hematoma, restp distress, or other abnormalities. Discharged in stable condition.  Final Clinical Impressions(s) / ED Diagnoses   Final diagnoses:  Encounter for removal of vascular catheter    New Prescriptions Discharge Medication List as of 12/16/2015  3:07 PM       Shaune Pollack, MD 12/16/15 1725

## 2015-12-16 NOTE — ED Notes (Signed)
Per MD [phone]) [facility]patient coming in to have dialysis cath removed-MD talked to Dr. Arlean HoppingSchertz to arrange procedure-please page MD when patients arrives/roomed-[phone]

## 2015-12-16 NOTE — ED Triage Notes (Signed)
[facility]. [facility].

## 2015-12-17 ENCOUNTER — Encounter (HOSPITAL_COMMUNITY): Payer: Self-pay | Admitting: Emergency Medicine

## 2015-12-17 ENCOUNTER — Emergency Department (HOSPITAL_COMMUNITY): Payer: Medicare Other

## 2015-12-17 ENCOUNTER — Inpatient Hospital Stay (HOSPITAL_COMMUNITY)
Admission: EM | Admit: 2015-12-17 | Discharge: 2015-12-28 | DRG: 281 | Disposition: A | Discharge status: Skilled Nursing Facility | Payer: Medicare Other | Attending: Internal Medicine | Admitting: Internal Medicine

## 2015-12-17 DIAGNOSIS — Z794 Long term (current) use of insulin: Secondary | ICD-10-CM | POA: Diagnosis not present

## 2015-12-17 DIAGNOSIS — W19XXXA Unspecified fall, initial encounter: Secondary | ICD-10-CM | POA: Diagnosis present

## 2015-12-17 DIAGNOSIS — I10 Essential (primary) hypertension: Secondary | ICD-10-CM | POA: Diagnosis not present

## 2015-12-17 DIAGNOSIS — D509 Iron deficiency anemia, unspecified: Secondary | ICD-10-CM | POA: Diagnosis present

## 2015-12-17 DIAGNOSIS — E7849 Other hyperlipidemia: Secondary | ICD-10-CM | POA: Diagnosis present

## 2015-12-17 DIAGNOSIS — K5900 Constipation, unspecified: Secondary | ICD-10-CM | POA: Diagnosis present

## 2015-12-17 DIAGNOSIS — D638 Anemia in other chronic diseases classified elsewhere: Secondary | ICD-10-CM | POA: Diagnosis present

## 2015-12-17 DIAGNOSIS — D7582 Heparin induced thrombocytopenia (HIT): Secondary | ICD-10-CM | POA: Diagnosis present

## 2015-12-17 DIAGNOSIS — Z91041 Radiographic dye allergy status: Secondary | ICD-10-CM

## 2015-12-17 DIAGNOSIS — K219 Gastro-esophageal reflux disease without esophagitis: Secondary | ICD-10-CM | POA: Diagnosis present

## 2015-12-17 DIAGNOSIS — Z8249 Family history of ischemic heart disease and other diseases of the circulatory system: Secondary | ICD-10-CM | POA: Diagnosis not present

## 2015-12-17 DIAGNOSIS — E1165 Type 2 diabetes mellitus with hyperglycemia: Secondary | ICD-10-CM | POA: Diagnosis present

## 2015-12-17 DIAGNOSIS — E784 Other hyperlipidemia: Secondary | ICD-10-CM | POA: Diagnosis not present

## 2015-12-17 DIAGNOSIS — I2510 Atherosclerotic heart disease of native coronary artery without angina pectoris: Secondary | ICD-10-CM | POA: Diagnosis present

## 2015-12-17 DIAGNOSIS — R0789 Other chest pain: Secondary | ICD-10-CM | POA: Diagnosis present

## 2015-12-17 DIAGNOSIS — N184 Chronic kidney disease, stage 4 (severe): Secondary | ICD-10-CM | POA: Diagnosis present

## 2015-12-17 DIAGNOSIS — I472 Ventricular tachycardia: Secondary | ICD-10-CM | POA: Diagnosis present

## 2015-12-17 DIAGNOSIS — Z833 Family history of diabetes mellitus: Secondary | ICD-10-CM

## 2015-12-17 DIAGNOSIS — E119 Type 2 diabetes mellitus without complications: Secondary | ICD-10-CM | POA: Diagnosis not present

## 2015-12-17 DIAGNOSIS — I499 Cardiac arrhythmia, unspecified: Secondary | ICD-10-CM

## 2015-12-17 DIAGNOSIS — R079 Chest pain, unspecified: Secondary | ICD-10-CM | POA: Diagnosis present

## 2015-12-17 DIAGNOSIS — I214 Non-ST elevation (NSTEMI) myocardial infarction: Principal | ICD-10-CM | POA: Diagnosis present

## 2015-12-17 DIAGNOSIS — D649 Anemia, unspecified: Secondary | ICD-10-CM | POA: Diagnosis not present

## 2015-12-17 DIAGNOSIS — Z6839 Body mass index (BMI) 39.0-39.9, adult: Secondary | ICD-10-CM | POA: Diagnosis not present

## 2015-12-17 DIAGNOSIS — I878 Other specified disorders of veins: Secondary | ICD-10-CM

## 2015-12-17 DIAGNOSIS — Z90710 Acquired absence of both cervix and uterus: Secondary | ICD-10-CM

## 2015-12-17 DIAGNOSIS — R739 Hyperglycemia, unspecified: Secondary | ICD-10-CM

## 2015-12-17 DIAGNOSIS — R748 Abnormal levels of other serum enzymes: Secondary | ICD-10-CM | POA: Diagnosis not present

## 2015-12-17 DIAGNOSIS — R778 Other specified abnormalities of plasma proteins: Secondary | ICD-10-CM

## 2015-12-17 DIAGNOSIS — D75829 Heparin-induced thrombocytopenia, unspecified: Secondary | ICD-10-CM

## 2015-12-17 DIAGNOSIS — Z79899 Other long term (current) drug therapy: Secondary | ICD-10-CM

## 2015-12-17 DIAGNOSIS — M79609 Pain in unspecified limb: Secondary | ICD-10-CM | POA: Diagnosis not present

## 2015-12-17 DIAGNOSIS — R7989 Other specified abnormal findings of blood chemistry: Secondary | ICD-10-CM

## 2015-12-17 DIAGNOSIS — I6522 Occlusion and stenosis of left carotid artery: Secondary | ICD-10-CM | POA: Diagnosis not present

## 2015-12-17 DIAGNOSIS — Z888 Allergy status to other drugs, medicaments and biological substances status: Secondary | ICD-10-CM

## 2015-12-17 DIAGNOSIS — R52 Pain, unspecified: Secondary | ICD-10-CM

## 2015-12-17 DIAGNOSIS — I129 Hypertensive chronic kidney disease with stage 1 through stage 4 chronic kidney disease, or unspecified chronic kidney disease: Secondary | ICD-10-CM | POA: Diagnosis present

## 2015-12-17 DIAGNOSIS — R5383 Other fatigue: Secondary | ICD-10-CM

## 2015-12-17 DIAGNOSIS — G459 Transient cerebral ischemic attack, unspecified: Secondary | ICD-10-CM

## 2015-12-17 DIAGNOSIS — D696 Thrombocytopenia, unspecified: Secondary | ICD-10-CM | POA: Diagnosis present

## 2015-12-17 DIAGNOSIS — I6523 Occlusion and stenosis of bilateral carotid arteries: Secondary | ICD-10-CM | POA: Diagnosis present

## 2015-12-17 DIAGNOSIS — N179 Acute kidney failure, unspecified: Secondary | ICD-10-CM | POA: Diagnosis present

## 2015-12-17 DIAGNOSIS — M79605 Pain in left leg: Secondary | ICD-10-CM | POA: Diagnosis present

## 2015-12-17 DIAGNOSIS — R262 Difficulty in walking, not elsewhere classified: Secondary | ICD-10-CM

## 2015-12-17 DIAGNOSIS — E785 Hyperlipidemia, unspecified: Secondary | ICD-10-CM | POA: Diagnosis present

## 2015-12-17 DIAGNOSIS — E669 Obesity, unspecified: Secondary | ICD-10-CM | POA: Diagnosis present

## 2015-12-17 DIAGNOSIS — I639 Cerebral infarction, unspecified: Secondary | ICD-10-CM | POA: Diagnosis not present

## 2015-12-17 HISTORY — DX: Chronic kidney disease, stage 4 (severe): N18.4

## 2015-12-17 HISTORY — DX: Non-ST elevation (NSTEMI) myocardial infarction: I21.4

## 2015-12-17 LAB — BASIC METABOLIC PANEL
ANION GAP: 8 (ref 5–15)
BUN: 30 mg/dL — ABNORMAL HIGH (ref 6–20)
CHLORIDE: 109 mmol/L (ref 101–111)
CO2: 19 mmol/L — ABNORMAL LOW (ref 22–32)
Calcium: 9.4 mg/dL (ref 8.9–10.3)
Creatinine, Ser: 2.63 mg/dL — ABNORMAL HIGH (ref 0.44–1.00)
GFR calc Af Amer: 20 mL/min — ABNORMAL LOW (ref >60)
GFR, EST NON AFRICAN AMERICAN: 17 mL/min — AB (ref >60)
GLUCOSE: 320 mg/dL — AB (ref 65–99)
POTASSIUM: 4 mmol/L (ref 3.5–5.1)
Sodium: 136 mmol/L (ref 135–145)

## 2015-12-17 LAB — I-STAT TROPONIN, ED
Troponin i, poc: 0.51 ng/mL (ref 0.00–0.08)
Troponin i, poc: 1.5 ng/mL (ref 0.00–0.08)

## 2015-12-17 LAB — CBC
HEMATOCRIT: 27.9 % — AB (ref 36.0–46.0)
HEMOGLOBIN: 8.9 g/dL — AB (ref 12.0–15.0)
MCH: 27.6 pg (ref 26.0–34.0)
MCHC: 31.9 g/dL (ref 30.0–36.0)
MCV: 86.6 fL (ref 78.0–100.0)
Platelets: 51 10*3/uL — ABNORMAL LOW (ref 150–400)
RBC: 3.22 MIL/uL — ABNORMAL LOW (ref 3.87–5.11)
RDW: 13.1 % (ref 11.5–15.5)
WBC: 11 10*3/uL — ABNORMAL HIGH (ref 4.0–10.5)

## 2015-12-17 LAB — TROPONIN I: TROPONIN I: 0.96 ng/mL — AB (ref <0.03)

## 2015-12-17 MED ORDER — ACETAMINOPHEN 325 MG PO TABS
650.0000 mg | ORAL_TABLET | Freq: Four times a day (QID) | ORAL | Status: DC | PRN
  Administered 2015-12-19 – 2015-12-28 (×5): 650 mg via ORAL
  Filled 2015-12-17 (×4): qty 2

## 2015-12-17 MED ORDER — EZETIMIBE 10 MG PO TABS
10.0000 mg | ORAL_TABLET | Freq: Every day | ORAL | Status: DC
  Administered 2015-12-18 – 2015-12-27 (×10): 10 mg via ORAL
  Filled 2015-12-17 (×10): qty 1

## 2015-12-17 MED ORDER — PRO-STAT SUGAR FREE PO LIQD
30.0000 mL | Freq: Two times a day (BID) | ORAL | Status: DC
  Administered 2015-12-18 – 2015-12-28 (×18): 30 mL via ORAL
  Filled 2015-12-17 (×16): qty 30

## 2015-12-17 MED ORDER — INSULIN GLARGINE 100 UNIT/ML ~~LOC~~ SOLN
10.0000 [IU] | Freq: Every day | SUBCUTANEOUS | Status: DC
  Administered 2015-12-18: 10 [IU] via SUBCUTANEOUS
  Filled 2015-12-17 (×2): qty 0.1

## 2015-12-17 MED ORDER — BISACODYL 10 MG RE SUPP: 10.0000 mg | RECTAL | Status: DC | PRN

## 2015-12-17 MED ORDER — ASPIRIN 325 MG PO TABS
325.0000 mg | ORAL_TABLET | Freq: Every day | ORAL | Status: DC
  Filled 2015-12-17: qty 1

## 2015-12-17 MED ORDER — PANTOPRAZOLE SODIUM 40 MG PO TBEC
40.0000 mg | DELAYED_RELEASE_TABLET | Freq: Every day | ORAL | Status: DC
  Administered 2015-12-18 – 2015-12-28 (×11): 40 mg via ORAL
  Filled 2015-12-17 (×11): qty 1

## 2015-12-17 MED ORDER — ONDANSETRON 4 MG PO TBDP
4.0000 mg | ORAL_TABLET | Freq: Three times a day (TID) | ORAL | Status: DC | PRN
  Filled 2015-12-17: qty 1

## 2015-12-17 MED ORDER — DOXAZOSIN MESYLATE 4 MG PO TABS
4.0000 mg | ORAL_TABLET | Freq: Every day | ORAL | Status: DC
  Administered 2015-12-18 – 2015-12-27 (×10): 4 mg via ORAL
  Filled 2015-12-17 (×10): qty 1

## 2015-12-17 MED ORDER — NITROGLYCERIN 0.4 MG SL SUBL: 0.4000 mg | SUBLINGUAL_TABLET | SUBLINGUAL | Status: DC | PRN

## 2015-12-17 MED ORDER — ALPRAZOLAM 0.25 MG PO TABS
0.2500 mg | ORAL_TABLET | Freq: Two times a day (BID) | ORAL | Status: DC | PRN
  Administered 2015-12-18: 0.25 mg via ORAL
  Filled 2015-12-17: qty 1

## 2015-12-17 MED ORDER — MOMETASONE FURO-FORMOTEROL FUM 100-5 MCG/ACT IN AERO
2.0000 | INHALATION_SPRAY | Freq: Two times a day (BID) | RESPIRATORY_TRACT | Status: DC
  Administered 2015-12-18 – 2015-12-28 (×17): 2 via RESPIRATORY_TRACT
  Filled 2015-12-17 (×4): qty 8.8

## 2015-12-17 MED ORDER — MORPHINE SULFATE (PF) 4 MG/ML IV SOLN
2.0000 mg | INTRAVENOUS | Status: DC | PRN
  Administered 2015-12-21: 2 mg via INTRAVENOUS
  Filled 2015-12-17: qty 1

## 2015-12-17 MED ORDER — TECHNETIUM TO 99M ALBUMIN AGGREGATED
4.3000 | Freq: Once | INTRAVENOUS | Status: AC | PRN
  Administered 2015-12-17: 4.3 via INTRAVENOUS

## 2015-12-17 MED ORDER — ONDANSETRON HCL 4 MG/2ML IJ SOLN
4.0000 mg | Freq: Four times a day (QID) | INTRAMUSCULAR | Status: DC | PRN
  Administered 2015-12-26: 4 mg via INTRAVENOUS
  Filled 2015-12-17 (×2): qty 2

## 2015-12-17 MED ORDER — HYDRALAZINE HCL 50 MG PO TABS
50.0000 mg | ORAL_TABLET | Freq: Three times a day (TID) | ORAL | Status: DC
  Administered 2015-12-18 – 2015-12-28 (×32): 50 mg via ORAL
  Filled 2015-12-17 (×7): qty 1
  Filled 2015-12-17: qty 2
  Filled 2015-12-17 (×3): qty 1
  Filled 2015-12-17: qty 2
  Filled 2015-12-17: qty 1
  Filled 2015-12-17: qty 2
  Filled 2015-12-17 (×4): qty 1
  Filled 2015-12-17: qty 2
  Filled 2015-12-17 (×3): qty 1
  Filled 2015-12-17: qty 2
  Filled 2015-12-17 (×3): qty 1
  Filled 2015-12-17: qty 2
  Filled 2015-12-17 (×3): qty 1
  Filled 2015-12-17: qty 2
  Filled 2015-12-17: qty 1

## 2015-12-17 MED ORDER — POLYETHYLENE GLYCOL 3350 17 G PO PACK
17.0000 g | PACK | Freq: Every day | ORAL | Status: DC | PRN
  Administered 2015-12-24 – 2015-12-27 (×2): 17 g via ORAL
  Filled 2015-12-17 (×2): qty 1

## 2015-12-17 MED ORDER — TECHNETIUM TC 99M DIETHYLENETRIAME-PENTAACETIC ACID: 32.9000 | Freq: Once | INTRAVENOUS | Status: DC | PRN

## 2015-12-17 MED ORDER — HEPARIN (PORCINE) IN NACL 100-0.45 UNIT/ML-% IJ SOLN
1000.0000 [IU]/h | INTRAMUSCULAR | Status: DC
  Administered 2015-12-17: 1000 [IU]/h via INTRAVENOUS
  Filled 2015-12-17: qty 250

## 2015-12-17 MED ORDER — FLUTICASONE PROPIONATE 50 MCG/ACT NA SUSP
1.0000 | Freq: Every day | NASAL | Status: DC | PRN
  Filled 2015-12-17: qty 16

## 2015-12-17 MED ORDER — AMLODIPINE BESYLATE 10 MG PO TABS
10.0000 mg | ORAL_TABLET | Freq: Every day | ORAL | Status: DC
  Administered 2015-12-18 – 2015-12-28 (×11): 10 mg via ORAL
  Filled 2015-12-17 (×3): qty 1
  Filled 2015-12-17: qty 2
  Filled 2015-12-17 (×2): qty 1
  Filled 2015-12-17: qty 2
  Filled 2015-12-17 (×4): qty 1

## 2015-12-17 MED ORDER — SODIUM CHLORIDE 0.9 % IV SOLN
INTRAVENOUS | Status: DC
  Administered 2015-12-18 – 2015-12-21 (×4): via INTRAVENOUS

## 2015-12-17 MED ORDER — ROSUVASTATIN CALCIUM 10 MG PO TABS
40.0000 mg | ORAL_TABLET | Freq: Every day | ORAL | Status: DC
  Administered 2015-12-18 – 2015-12-27 (×10): 40 mg via ORAL
  Filled 2015-12-17 (×5): qty 4
  Filled 2015-12-17: qty 2
  Filled 2015-12-17 (×4): qty 4

## 2015-12-17 MED ORDER — ZOLPIDEM TARTRATE 5 MG PO TABS: 5.0000 mg | ORAL_TABLET | Freq: Every evening | ORAL | Status: DC | PRN

## 2015-12-17 MED ORDER — HEPARIN BOLUS VIA INFUSION
4000.0000 [IU] | Freq: Once | INTRAVENOUS | Status: AC
  Administered 2015-12-17: 4000 [IU] via INTRAVENOUS
  Filled 2015-12-17: qty 4000

## 2015-12-17 MED ORDER — GABAPENTIN 400 MG PO CAPS
400.0000 mg | ORAL_CAPSULE | Freq: Three times a day (TID) | ORAL | Status: DC
  Administered 2015-12-18 – 2015-12-28 (×32): 400 mg via ORAL
  Filled 2015-12-17 (×32): qty 1

## 2015-12-17 MED ORDER — INSULIN ASPART 100 UNIT/ML ~~LOC~~ SOLN
0.0000 [IU] | Freq: Three times a day (TID) | SUBCUTANEOUS | Status: DC
Start: 2015-12-18 — End: 2015-12-18

## 2015-12-17 NOTE — ED Provider Notes (Addendum)
[facility]-EMERGENCY DEPT Provider Note   CSN: [id number] Arrival date & time: 12/17/15  1540     History   Chief Complaint Chief Complaint  Patient presents with  . Chest Pain    HPI Kearstyn NITI LEISURE is a 70 y.o. female.  HPI   70 year old obese female with history of GERD, hypertension, CAD, insulin-dependent diabetes, asthma, acute renal failure requiring dialysis,  presenting to the ER for evaluation of chest pain.Patient report reported having sudden onset of substernal sharp chest pain that started earlier today. Pain is lasting for seconds to minutes, with associated shortness of breath. She also report generalized fatigue during these episodes. The symptom is improving without any specific treatment. No associated fever, chills, lightheadedness, dizziness, nausea, diaphoresis during these episodes. Has a history of heartburn but states that this felt different. Denies any active pain at this time.  Pt was seen yesterday in the ER for removal of her dialysis catheter at the R IJ.  Patient report acute onset of renal failure requiring dialysis. She is unsure why she had the renal failure. She is able to make urine. She does complaining of persistent left leg pain specifically her left knee from a prior fall. She denies any prior history of PE or DVT.  EMS did mentioned pt was showing afib with RVR on initial EKG but that has since resolved.   Past Medical History:  Diagnosis Date  . ALLERGIC RHINITIS   . Arthritis   . ASTHMA   . Asthma   . Diabetes mellitus without complication (HCC)   . Esophageal reflux   . Hypertension   . SINUSITIS, ACUTE     Patient Active Problem List   Diagnosis Date Noted  . Somnolence   . Acute renal failure (HCC) 12/05/2015  . Pressure injury of skin 12/05/2015  . Degenerative spondylolisthesis 11/07/2015  . Carotid artery disease (HCC) 06/20/2014  . Bradycardia 10/12/2013  . Fatigue 10/12/2013  . Hyperlipidemia 05/14/2013  . Type II or  unspecified type diabetes mellitus without mention of complication, uncontrolled 02/19/2013  . Coronary atherosclerosis of native coronary artery 12/22/2012  . Obesity 12/22/2012  . Hypertension   . SINUSITIS, ACUTE 04/01/2008  . Type 2 diabetes mellitus treated with insulin (HCC) 02/23/2007  . ALLERGIC RHINITIS 02/23/2007  . ASTHMA 02/23/2007  . Esophageal reflux 02/23/2007    Past Surgical History:  Procedure Laterality Date  . ABDOMINAL HYSTERECTOMY    . arthroscopic knee Right 2015  . BACK SURGERY    . BREAST REDUCTION SURGERY Bilateral 2006  . BREAST SURGERY    . HAND SURGERY Left    damaged nerve in hand    OB History    No data available       Home Medications    Prior to Admission medications   Medication Sig Start Date End Date Taking? Authorizing Provider  acetaminophen (TYLENOL) 325 MG tablet Take 325 mg by mouth 3 (three) times daily.     Historical Provider, MD  albuterol (PROVENTIL HFA;VENTOLIN HFA) 108 (90 BASE) MCG/ACT inhaler Inhale 2 puffs into the lungs every 6 (six) hours as needed for wheezing or shortness of breath.    Historical Provider, MD  amLODipine (NORVASC) 10 MG tablet Take 1 tablet (10 mg total) by mouth daily. 07/26/14   Donnetta Hutching, MD  bisacodyl (DULCOLAX) 10 MG suppository Place 10 mg rectally every 4 (four) hours as needed for moderate constipation.     Historical Provider, MD  doxazosin (CARDURA) 4 MG tablet take 1 TABLET  AT BEDTIME Patient taking differently: Take 4 mg by mouth at bedtime.  04/15/15   Reather Littler, MD  ezetimibe (ZETIA) 10 MG tablet Take 1 tablet (10 mg total) by mouth daily. Patient taking differently: Take 10 mg by mouth at bedtime.  07/20/13   Reather Littler, MD  fluticasone (FLONASE) 50 MCG/ACT nasal spray Place 1 spray into both nostrils daily as needed for allergies.     Historical Provider, MD  Fluticasone-Salmeterol (ADVAIR) 100-50 MCG/DOSE AEPB Inhale 1 puff into the lungs 2 (two) times daily.     Historical Provider, MD    gabapentin (NEURONTIN) 400 MG capsule Take 400 mg by mouth 3 (three) times daily.    Historical Provider, MD  glucose blood (ONETOUCH VERIO) test strip TEST BLOOD SUGAR twice a day Dx code E11.9 04/15/15   Reather Littler, MD  hydrALAZINE (APRESOLINE) 50 MG tablet Take 1 tablet by mouth 3 times a day. Patient taking differently: Take 50 mg by mouth 3 (three) times daily.  10/17/14   Corky Crafts, MD  insulin aspart (NOVOLOG) 100 UNIT/ML injection Inject 5 Units into the skin 2 (two) times daily.    Historical Provider, MD  Insulin Glargine (LANTUS) 100 UNIT/ML Solostar Pen Inject 10 Units into the skin daily at 10 pm. 12/12/15   Alison Murray, MD  Insulin Pen Needle (BD PEN NEEDLE NANO U/F) 32G X 4 MM MISC Use 4 per day 04/15/15   Reather Littler, MD  omeprazole (PRILOSEC) 20 MG capsule Take 20 mg by mouth daily at 6 (six) AM.     Historical Provider, MD  ondansetron (ZOFRAN ODT) 4 MG disintegrating tablet Take 1 tablet (4 mg total) by mouth every 8 (eight) hours as needed for nausea or vomiting. 01/27/15   Shawn C Joy, PA-C  ONETOUCH DELICA LANCETS 33G MISC Use to check blood sugar twice a day dx code E11.9 04/15/15   Reather Littler, MD  polyethylene glycol (MIRALAX / GLYCOLAX) packet Take 17 g by mouth daily as needed (For constipation.).     Historical Provider, MD  potassium chloride (K-DUR) 10 MEQ tablet Take 1 tablet (10 mEq total) by mouth daily. 12/12/15   Alison Murray, MD  rosuvastatin (CRESTOR) 40 MG tablet Take 1 tablet (40 mg total) by mouth daily. Patient taking differently: Take 40 mg by mouth at bedtime.  12/22/12   Corky Crafts, MD    Family History Family History  Problem Relation Age of Onset  . Diabetes Mother   . Heart attack Father   . Heart disease Father     Social History Social History  Substance Use Topics  . Smoking status: Never Smoker  . Smokeless tobacco: Never Used  . Alcohol use No     Allergies   Contrast media [iodinated diagnostic agents]; Latex;  Ace inhibitors; and Shellfish allergy   Review of Systems Review of Systems  All other systems reviewed and are negative.    Physical Exam Updated Vital Signs BP 141/59   Pulse 67   Resp 12   SpO2 99%   Physical Exam  Constitutional: She appears well-developed and well-nourished. No distress.  Obese female laying in bed in no acute discomfort.  HENT:  Head: Atraumatic.  Eyes: Conjunctivae are normal.  Neck: Neck supple.  Site of a recent IJ removal to the right side of the neck appears to be normal, no signs of infection.  Cardiovascular: Normal rate, regular rhythm and intact distal pulses.   Pulmonary/Chest: Effort normal and  breath sounds normal. No respiratory distress. She has no wheezes. She has no rales. She exhibits no tenderness.  Abdominal: Soft. There is no tenderness.  Musculoskeletal:  Trace edema noted to bilateral lower extremities without palpable cords, erythema, or edema. Tenderness noted to the anterior aspect of left knee without signs of infection.  Neurological: She is alert.  Skin: No rash noted.  Psychiatric: She has a normal mood and affect.  Nursing note and vitals reviewed.    ED Treatments / Results  Labs (all labs ordered are listed, but only abnormal results are displayed) Labs Reviewed  BASIC METABOLIC PANEL - Abnormal; Notable for the following:       Result Value   CO2 19 (*)    Glucose, Bld 320 (*)    BUN 30 (*)    Creatinine, Ser 2.63 (*)    GFR calc non Af Amer 17 (*)    GFR calc Af Amer 20 (*)    All other components within normal limits  CBC - Abnormal; Notable for the following:    WBC 11.0 (*)    RBC 3.22 (*)    Hemoglobin 8.9 (*)    HCT 27.9 (*)    Platelets 51 (*)    All other components within normal limits  TROPONIN I - Abnormal; Notable for the following:    Troponin I 0.96 (*)    All other components within normal limits  I-STAT TROPOININ, ED - Abnormal; Notable for the following:    Troponin i, poc 0.51 (*)     All other components within normal limits  I-STAT TROPOININ, ED - Abnormal; Notable for the following:    Troponin i, poc 1.50 (*)    All other components within normal limits  CBC  HEPARIN LEVEL (UNFRACTIONATED)    EKG  EKG Interpretation None      Date: 12/17/2015  Rate: 82  Rhythm: normal sinus rhythm  QRS Axis: normal  Intervals: normal  ST/T Wave abnormalities: normal  Conduction Disutrbances: none  Narrative Interpretation:   Old EKG Reviewed: No significant changes noted     Radiology Dg Chest 2 View  Result Date: 12/17/2015 CLINICAL DATA:  Pt reports dull mid-sternal chest pains that increases with breathing and slight SOB. EXAM: CHEST - 2 VIEW COMPARISON:  12/06/2015 FINDINGS: Lungs are clear. Heart size and mediastinal contours are within normal limits. Atheromatous aorta. No effusion. Lumbar fusion hardware partially visualized. IMPRESSION: No acute cardiopulmonary disease. Electronically Signed   By: Corlis Leak  Hassell M.D.   On: 12/17/2015 17:05   Nm Pulmonary Perf And Vent  Result Date: 12/17/2015 CLINICAL DATA:  Chest pain and shortness of breath. EXAM: NUCLEAR MEDICINE VENTILATION - PERFUSION LUNG SCAN TECHNIQUE: Ventilation images were obtained in multiple projections using inhaled aerosol Tc-6260m DTPA. Perfusion images were obtained in multiple projections after intravenous injection of Tc-5360m MAA. RADIOPHARMACEUTICALS:  32.9 mCi Technetium-2360m DTPA aerosol inhalation and 4.06 mCi Technetium-4660m MAA IV COMPARISON:  Chest radiograph earlier this day FINDINGS: Ventilation: No focal ventilation defect. Perfusion: Single small (subsegmental) perfusion defect posterior medial left lower lobe. No additional or wedge shaped peripheral perfusion defects to suggest acute pulmonary embolism. IMPRESSION: Low probability for pulmonary embolus. Electronically Signed   By: Rubye OaksMelanie  Ehinger M.D.   On: 12/17/2015 19:47    Procedures Procedures (including critical care  time)  Medications Ordered in ED Medications  technetium TC 62M diethylenetriame-pentaacetic acid (DTPA) injection 32.9 millicurie (not administered)  heparin bolus via infusion 4,000 Units (not administered)  heparin ADULT infusion  100 units/mL (25000 units/2450mL sodium chloride 0.45%) (not administered)  technetium albumin aggregated (MAA) injection solution 4.3 millicurie (4.3 millicuries Intravenous Contrast Given 12/17/15 1905)     Initial Impression / Assessment and Plan / ED Course  I have reviewed the triage vital signs and the nursing notes.  Pertinent labs & imaging results that were available during my care of the patient were reviewed by me and considered in my medical decision making (see chart for details).  Clinical Course     BP 141/59   Pulse 67   Resp 12   SpO2 99%    Final Clinical Impressions(s) / ED Diagnoses   Final diagnoses:  Atypical chest pain  Elevated troponin    New Prescriptions New Prescriptions   No medications on file   4:14 PM Patient presents with acute onset of sharp midsternal chest pain shortness of breath and generalized fatigue. She recently had an IJ IV access to her right side of neck that was removed. She also has had L3/L4 decompressive laminectomy and foraminotomy on 10/6.  She's at increase risk of develop thromboembolism.   When she first seen by EMS, she was found to be in A. fib. She received three SL nitroglycerin and four 81 mg ASA.  Her A. fib was self converted following IV placement. Patient states she felt much better after conversion and is no longer experiencing chest pain.  Her CHADVASC score is 5, which puts her at moderate-high risk for stroke, she's a good candidate for anticoagulation.    9:01 PM Patient continues to remain chest pain-free. She has an elevated troponin of 0.51 initially. A repeat troponin 3 hours later shows an increase to 1.51 but she continues to denies having any active chest pain. A V/Q  study was obtained showing low probability for PE. Her chest x-ray is unremarkable. Her EKG shows no concerning changes. A repeat EKG without change.  Her blood pressure is stable. She is resting comfortably. She has a heart cath done in 2012 but I am unable to locate the results. Care discussed with Dr. Freida BusmanAllen, will consult cardiology for further care.    9:28 PM Appreciate consultation from oncall cardiologist Dr. Mayford Knifeurner who request a troponin I to be obtain and to consult her once it result.  Order placed.    10:40 PM Troponin I is elevated at 0.96.  No active CP.  I have updated the result with Dr. Mayford Knifeurner who request medicine for admission, and to continue trending cardiac enzyme.  Cardiology will see pt in the AM.  Will start heparin.    11:03 PM Care discussed with [facility] Dr. Clyde LundborgNiu who agrees to admit pt to telemetry, under his care.   CRITICAL CARE Performed by: Fayrene HelperRAN,Thomasene Dubow Total critical care time: 40 minutes Critical care time was exclusive of separately billable procedures and treating other patients. Critical care was necessary to treat or prevent imminent or life-threatening deterioration. Critical care was time spent personally by me on the following activities: development of treatment plan with patient and/or surrogate as well as nursing, discussions with consultants, evaluation of patient's response to treatment, examination of patient, obtaining history from patient or surrogate, ordering and performing treatments and interventions, ordering and review of laboratory studies, ordering and review of radiographic studies, pulse oximetry and re-evaluation of patient's condition.    Fayrene HelperBowie Koraline Phillipson, PA-C 12/17/15 [id number]    Lorre NickAnthony Allen, MD 12/17/15 2314    Fayrene HelperBowie Lorriann Hansmann, PA-C 12/17/15 [id number]

## 2015-12-17 NOTE — ED Triage Notes (Addendum)
Pt arrived from local nursing facility via EMS for acute onset CP without radiation. EMS found pt complaining of substernal CP and to be in A-fib upon arrival. Gave 3 nitro SL tabs and four 81 mg ASA. Pt self converted following IV placement, per EMS. Pt stated she felt "much better" after conversion and is no longer experiencing CP. Pt stated she had SOB and felt as thought "someone was sitting on her chest" during CP episode. VSS on arrival to ED.

## 2015-12-17 NOTE — ED Notes (Signed)
Spoke to NM.  States approx 1.5 hours for preparation for exam.  Made pt aware.

## 2015-12-17 NOTE — ED Notes (Signed)
NM called RN.  Will come to pick up patient shortly.

## 2015-12-17 NOTE — ED Notes (Signed)
Family asking for update.  PA made aware.  At bedside at this time.

## 2015-12-17 NOTE — Progress Notes (Signed)
ANTICOAGULATION CONSULT NOTE - Initial Consult  Pharmacy Consult for heparin Indication: chest pain/ACS  Allergies  Allergen Reactions  . Ace Inhibitors Cough  . Ampicillin Other (See Comments)    Sensitive per lab paperwork from facility  . Cefazolin Other (See Comments)    Sensitive per lab paperwork from facility  . Cefepime Other (See Comments)    Sensitive per lab paperwork from facility  . Ceftazidime Other (See Comments)    Sensitive per lab paperwork from facility  . Ceftriaxone Other (See Comments)    Sensitive per lab paperwork from facility  . Ciprofloxacin Other (See Comments)    Sensitive per lab paperwork from facility  . Contrast Media [Iodinated Diagnostic Agents] Other (See Comments)    Reaction unknown  . Ertapenem Other (See Comments)    Sensitive per lab paperwork from facility  . Gentamicin Other (See Comments)    Sensitive per lab paperwork from facility  . Imipenem Other (See Comments)    Sensitive per lab paperwork from facility  . Latex Other (See Comments)    Unknown reaction per MAR   . Levofloxacin Other (See Comments)    Sensitive per lab paperwork from facility  . Nitrofuran Derivatives Other (See Comments)    Sensitive per lab paperwork from facility  . Shellfish Allergy Other (See Comments)    Pt cannot recall at this time  . Tobramycin Other (See Comments)    Sensitive per lab paperwork from facility  . Zosyn [Piperacillin Sod-Tazobactam So] Other (See Comments)    Sensitive per lab paperwork from facility    Patient Measurements: TBW: 100.3 kg IBW: 52.4 kg  Heparin Dosing Weight: 76 kg   Assessment: 70 yo F presents on 11/15 with chest pain. Pharmacy consulted to dose heparin.   Goal of Therapy:  Heparin level 0.3-0.7 units/ml Monitor platelets by anticoagulation protocol: Yes   Plan:  Give heparin 4,000 unit bolus Start heparin gtt at 1,000 units/hr Check 8 hr HL Monitor daily HL, CBC, s/s of bleed  Enzo BiNathan Ia Leeb,  PharmD, [facility] Clinical Pharmacist Pager [phone] 12/17/2015 10:56 PM

## 2015-12-17 NOTE — H&P (Signed)
History and Physical    Heidi Campbell ZOX:[id number] DOB: 02/08/1945 DOA: 12/17/2015  Referring MD/NP/PA:   PCP: Astrid Divine, MD   Patient coming from:  The patient is coming from home.  At baseline, pt is independent for most of ADL.   Chief Complaint: chest pain  HPI: Heidi Campbell is a 70 y.o. female with medical history significant of hypertension, hyperlipidemia, diabetes mellitus, asthma, GERD, CAD, CKD-IV, arthritis, anemia, who presents with chest pain.  Patient states that her chest pain started this morning. It is located in the substernal area, constant, 7 out of 10 in severity, pressure-like pain. Nonradiating. It is not aggravated or alleviated by any known factors. It is associated with mild shortness of breath. No cough, fever or chills. She has left anterior leg pain, but no tenderness over calf areas. Patient was given aspirin and nitroglycerin. Her chest pain has subsided. Currently no chest pain and shortness of breath. Patient denies nausea, vomiting, abdominal, symptoms for UTI, diarrhea, unilateral weakness. Of note, pt was noted to have A fib with RVR by EMS, but no EKG recorded. Her EKG did not show A fib in ED.  ED Course: pt was found to have WBC 11.0, troponin 0.51-->1.50, platelets of 51, hemoglobin 8.9 which is at baseline, temperature normal, no tachycardia, oxygen saturation 98% on room air. Blood pressure 135/64. Chest x-ray negative for acute abnormalities. V/Q scan showed low probability for PE. Pt is admitted to telemetry bed as inpatient. Cardiology, Dr. Mayford Knife was consulted.   Review of Systems:   General: no fevers, chills, no changes in body weight, has fatigue HEENT: no blurry vision, hearing changes or sore throat Respiratory: had dyspnea, no coughing, wheezing CV: has chest pain, no palpitations GI: no nausea, vomiting, abdominal pain, diarrhea, constipation GU: no dysuria, burning on urination, increased urinary frequency, hematuria    Ext: no leg edema Neuro: no unilateral weakness, numbness, or tingling, no vision change or hearing loss Skin: no rash, no skin tear. MSK: No muscle spasm, no deformity, no limitation of range of movement in spin Heme: No easy bruising.  Travel history: No recent long distant travel.  Allergy:  Allergies  Allergen Reactions  . Ace Inhibitors Cough  . Ampicillin Other (See Comments)    Sensitive per lab paperwork from facility  . Cefazolin Other (See Comments)    Sensitive per lab paperwork from facility  . Cefepime Other (See Comments)    Sensitive per lab paperwork from facility  . Ceftazidime Other (See Comments)    Sensitive per lab paperwork from facility  . Ceftriaxone Other (See Comments)    Sensitive per lab paperwork from facility  . Ciprofloxacin Other (See Comments)    Sensitive per lab paperwork from facility  . Contrast Media [Iodinated Diagnostic Agents] Other (See Comments)    Reaction unknown  . Ertapenem Other (See Comments)    Sensitive per lab paperwork from facility  . Gentamicin Other (See Comments)    Sensitive per lab paperwork from facility  . Imipenem Other (See Comments)    Sensitive per lab paperwork from facility  . Latex Other (See Comments)    Unknown reaction per MAR   . Levofloxacin Other (See Comments)    Sensitive per lab paperwork from facility  . Nitrofuran Derivatives Other (See Comments)    Sensitive per lab paperwork from facility  . Shellfish Allergy Other (See Comments)    Pt cannot recall at this time  . Tobramycin Other (See Comments)  Sensitive per lab paperwork from facility  . Zosyn [Piperacillin Sod-Tazobactam So] Other (See Comments)    Sensitive per lab paperwork from facility    Past Medical History:  Diagnosis Date  . ALLERGIC RHINITIS   . Arthritis   . ASTHMA   . Asthma   . Diabetes mellitus without complication (HCC)   . Esophageal reflux   . Hypertension   . SINUSITIS, ACUTE     Past Surgical History:   Procedure Laterality Date  . ABDOMINAL HYSTERECTOMY    . arthroscopic knee Right 2015  . BACK SURGERY    . BREAST REDUCTION SURGERY Bilateral 2006  . BREAST SURGERY    . HAND SURGERY Left    damaged nerve in hand    Social History:  reports that she has never smoked. She has never used smokeless tobacco. She reports that she does not drink alcohol or use drugs.  Family History:  Family History  Problem Relation Age of Onset  . Diabetes Mother   . Heart attack Father   . Heart disease Father      Prior to Admission medications   Medication Sig Start Date End Date Taking? Authorizing Provider  acetaminophen (TYLENOL) 325 MG tablet Take 325 mg by mouth 3 (three) times daily.    Yes Historical Provider, MD  albuterol (PROVENTIL HFA;VENTOLIN HFA) 108 (90 BASE) MCG/ACT inhaler Inhale 2 puffs into the lungs every 6 (six) hours as needed for wheezing or shortness of breath.   Yes Historical Provider, MD  Amino Acids-Protein Hydrolys (FEEDING SUPPLEMENT, PRO-STAT SUGAR FREE 64,) LIQD Take 30 mLs by mouth 2 (two) times daily.   Yes Historical Provider, MD  amLODipine (NORVASC) 10 MG tablet Take 1 tablet (10 mg total) by mouth daily. 07/26/14  Yes Donnetta Hutching, MD  bisacodyl (DULCOLAX) 10 MG suppository Place 10 mg rectally every 4 (four) hours as needed for moderate constipation.    Yes Historical Provider, MD  doxazosin (CARDURA) 4 MG tablet take 1 TABLET AT BEDTIME Patient taking differently: Take 4 mg by mouth at bedtime.  04/15/15  Yes Reather Littler, MD  ezetimibe (ZETIA) 10 MG tablet Take 1 tablet (10 mg total) by mouth daily. Patient taking differently: Take 10 mg by mouth at bedtime.  07/20/13  Yes Reather Littler, MD  fluticasone (FLONASE) 50 MCG/ACT nasal spray Place 1 spray into both nostrils daily as needed for allergies.    Yes Historical Provider, MD  Fluticasone-Salmeterol (ADVAIR) 100-50 MCG/DOSE AEPB Inhale 1 puff into the lungs 2 (two) times daily.    Yes Historical Provider, MD   gabapentin (NEURONTIN) 400 MG capsule Take 400 mg by mouth 3 (three) times daily.   Yes Historical Provider, MD  hydrALAZINE (APRESOLINE) 50 MG tablet Take 1 tablet by mouth 3 times a day. Patient taking differently: Take 50 mg by mouth 3 (three) times daily.  10/17/14  Yes Corky Crafts, MD  insulin aspart (NOVOLOG) 100 UNIT/ML injection Inject 10-23 Units into the skin See admin instructions. Takes 10 units twice daily, but if CBG 300-350 give addt'l 5 units and >350 give addt'l 8 units   Yes Historical Provider, MD  Insulin Glargine (LANTUS) 100 UNIT/ML Solostar Pen Inject 10 Units into the skin daily at 10 pm. 12/12/15  Yes Alison Murray, MD  nitroGLYCERIN (NITROSTAT) 0.4 MG SL tablet Place 0.4 mg under the tongue every 5 (five) minutes as needed for chest pain.   Yes Historical Provider, MD  omeprazole (PRILOSEC) 20 MG capsule Take 20 mg  by mouth daily at 6 (six) AM.    Yes Historical Provider, MD  ondansetron (ZOFRAN ODT) 4 MG disintegrating tablet Take 1 tablet (4 mg total) by mouth every 8 (eight) hours as needed for nausea or vomiting. 01/27/15  Yes Shawn C Joy, PA-C  polyethylene glycol (MIRALAX / GLYCOLAX) packet Take 17 g by mouth daily as needed (For constipation.).    Yes Historical Provider, MD  potassium chloride (K-DUR) 10 MEQ tablet Take 1 tablet (10 mEq total) by mouth daily. 12/12/15  Yes Alison MurrayAlma M Devine, MD  rosuvastatin (CRESTOR) 40 MG tablet Take 1 tablet (40 mg total) by mouth daily. Patient taking differently: Take 40 mg by mouth at bedtime.  12/22/12  Yes Corky CraftsJayadeep S Varanasi, MD    Physical Exam: Vitals:   12/18/15 0015 12/18/15 0030 12/18/15 0045 12/18/15 0147  BP: 145/60 133/58 137/66 (!) 130/59  Pulse: 70 69 70 72  Resp: 14 12 12 16   Temp:    98.1 F (36.7 C)  TempSrc:    Oral  SpO2: 98% 98% 98% 100%  Weight:    99.3 kg (219 lb)  Height:    5\' 3"  (1.6 m)   General: Not in acute distress HEENT:       Eyes: PERRL, EOMI, no scleral icterus.       ENT: No  discharge from the ears and nose, no pharynx injection, no tonsillar enlargement.        Neck: No JVD, no bruit, no mass felt. Heme: No neck lymph node enlargement. Cardiac: S1/S2, RRR, 1/6 systolic urmurs, No gallops or rubs. Respiratory: No rales, wheezing, rhonchi or rubs. GI: Soft, nondistended, nontender, no rebound pain, no organomegaly, BS present. GU: No hematuria Ext: No pitting leg edema bilaterally. 2+DP/PT pulse bilaterally. Musculoskeletal: No joint deformities, No joint redness or warmth, no limitation of ROM in spin. Skin: No rashes.  Neuro: drowsy, but oriented X3, cranial nerves II-XII grossly intact, moves all extremities normally. Psych: Patient is not psychotic, no suicidal or hemocidal ideation.  Labs on Admission: I have personally reviewed following labs and imaging studies  CBC:  Recent Labs Lab 12/11/15 0506 12/12/15 0500 12/17/15 1624  WBC 11.8* 11.2* 11.0*  HGB 9.3* 8.8* 8.9*  HCT 28.5* 27.7* 27.9*  MCV 85.6 86.8 86.6  PLT 246 226 51*   Basic Metabolic Panel:  Recent Labs Lab 12/11/15 0506 12/12/15 0500 12/12/15 0546 12/17/15 1624  NA 139 139 138 136  K 3.8 3.6 3.5 4.0  CL 111 109 109 109  CO2 20* 22 22 19*  GLUCOSE 136* 125* 140* 320*  BUN 23* 25* 24* 30*  CREATININE 3.28* 2.81* 2.88* 2.63*  CALCIUM 9.4 9.5 9.6 9.4  MG 1.5* 1.7  --   --   PHOS 2.9  --  3.1  --    GFR: Estimated Creatinine Clearance: 22.4 mL/min (by C-G formula based on SCr of 2.63 mg/dL (H)). Liver Function Tests:  Recent Labs Lab 12/11/15 0506 12/12/15 0546  ALBUMIN 2.6* 2.8*   No results for input(s): LIPASE, AMYLASE in the last 168 hours. No results for input(s): AMMONIA in the last 168 hours. Coagulation Profile:  Recent Labs Lab 12/17/15 2333  INR 1.32   Cardiac Enzymes:  Recent Labs Lab 12/17/15 1959 12/17/15 2333  TROPONINI 0.96* 1.24*   BNP (last 3 results) No results for input(s): PROBNP in the last 8760 hours. HbA1C: No results for  input(s): HGBA1C in the last 72 hours. CBG:  Recent Labs Lab 12/12/15 0422 12/12/15  [id number] 12/12/15 1141 12/12/15 1630 12/18/15 0153  GLUCAP 129* 137* 178* 225* 178*   Lipid Profile: No results for input(s): CHOL, HDL, LDLCALC, TRIG, CHOLHDL, LDLDIRECT in the last 72 hours. Thyroid Function Tests: No results for input(s): TSH, T4TOTAL, FREET4, T3FREE, THYROIDAB in the last 72 hours. Anemia Panel:  Recent Labs  12/17/15 2333  VITAMINB12 284  FOLATE 10.4  FERRITIN 92  TIBC 277  IRON 43  RETICCTPCT 0.9   Urine analysis:    Component Value Date/Time   COLORURINE YELLOW 12/07/2015 1130   APPEARANCEUR TURBID (A) 12/07/2015 1130   LABSPEC 1.013 12/07/2015 1130   PHURINE 6.0 12/07/2015 1130   GLUCOSEU NEGATIVE 12/07/2015 1130   GLUCOSEU NEGATIVE 07/03/2013 0856   HGBUR LARGE (A) 12/07/2015 1130   BILIRUBINUR NEGATIVE 12/07/2015 1130   BILIRUBINUR Neg 08/06/2014 1058   KETONESUR NEGATIVE 12/07/2015 1130   PROTEINUR 100 (A) 12/07/2015 1130   UROBILINOGEN 0.2 08/06/2014 1058   UROBILINOGEN 1.0 07/26/2014 1804   NITRITE NEGATIVE 12/07/2015 1130   LEUKOCYTESUR MODERATE (A) 12/07/2015 1130   Sepsis Labs: @LABRCNTIP (procalcitonin:4,lacticidven:4) )No results found for this or any previous visit (from the past 240 hour(s)).   Radiological Exams on Admission: Dg Chest 2 View  Result Date: 12/17/2015 CLINICAL DATA:  Pt reports dull mid-sternal chest pains that increases with breathing and slight SOB. EXAM: CHEST - 2 VIEW COMPARISON:  12/06/2015 FINDINGS: Lungs are clear. Heart size and mediastinal contours are within normal limits. Atheromatous aorta. No effusion. Lumbar fusion hardware partially visualized. IMPRESSION: No acute cardiopulmonary disease. Electronically Signed   By: Corlis Leak  Hassell M.D.   On: 12/17/2015 17:05   Nm Pulmonary Perf And Vent  Result Date: 12/17/2015 CLINICAL DATA:  Chest pain and shortness of breath. EXAM: NUCLEAR MEDICINE VENTILATION - PERFUSION LUNG  SCAN TECHNIQUE: Ventilation images were obtained in multiple projections using inhaled aerosol Tc-5962m DTPA. Perfusion images were obtained in multiple projections after intravenous injection of Tc-7362m MAA. RADIOPHARMACEUTICALS:  32.9 mCi Technetium-7762m DTPA aerosol inhalation and 4.06 mCi Technetium-6562m MAA IV COMPARISON:  Chest radiograph earlier this day FINDINGS: Ventilation: No focal ventilation defect. Perfusion: Single small (subsegmental) perfusion defect posterior medial left lower lobe. No additional or wedge shaped peripheral perfusion defects to suggest acute pulmonary embolism. IMPRESSION: Low probability for pulmonary embolus. Electronically Signed   By: Rubye OaksMelanie  Ehinger M.D.   On: 12/17/2015 19:47     EKG: Independently reviewed. Sinus rhythm, QTC 476, low voltage, T-wave flattening.   Assessment/Plan Principal Problem:   NSTEMI (non-ST elevated myocardial infarction) (HCC) Active Problems:   Type 2 diabetes mellitus treated with insulin (HCC)   Esophageal reflux   Hypertension   Coronary atherosclerosis of native coronary artery   Hyperlipidemia   CKD (chronic kidney disease), stage IV (HCC)   Atypical chest pain   Normocytic anemia   Thrombocytopenia (HCC)   NSTEMI and hx of CAD:  trop  troponin 0.51-->1.50. Currently CP free. Hemodynamically stable. Cardiology, Dr. Mayford Knifeurner was consulted.  - will admit to tele bed as inpt - start IV heparin--> pt has thrombocytopenia with platelet of 51, need to monitoring predated closely. - cycle CE q6 x3 and repeat her EKG in the am  - Nitroglycerin, Morphine, and aspirin, crestor and zetia - Risk factor stratification: will check FLP, UDS and A1C  - 2d echo - f/u Card recommendations  Thrombocytopenia: Platelets 51, etiology is not clear. Patient is mildly drowsy, but oriented 3, no bleeding tendency. Will r/o TTP -check LDH and peripheral smear -f/u by CBC  DM-II: Last A1c 11.2 on 10/30/15, poorly controled. Patient is taking  NovoLog and Lantus at home. Blood sugar 320 on admission -will continue home dose of Lantus 10 units daily,  -SSI -Check A1c  GERD: -Protonix  HTN:  -continue amlodipine, hydralazine  HLD: Last LDL was 94 04/22/15 -Continue home medications: Zetia and crestor -Check FLP  CKD (chronic kidney disease), stage IV: stable. Recent creatinine 2.88 on 12/12/15, her creatinine is 2.63, BUN 31 admission. -Follow-up renal function bu BMP  Normocytic anemia: Hemoglobin 8.9 which is at baseline. -check anemia panel   DVT ppx: on IV Heparin   Code Status: Full code Family Communication: None at bed side.  Disposition Plan:  Anticipate discharge back to previous home environment Consults called: Card, dr. Mayford Knife   Admission status: Inpatient/tele  Date of Service 12/18/2015    Lorretta Harp [facility] Pager [phone]  If 7PM-7AM, please contact night-coverage [email or web address] Password [email or web address] 12/18/2015, 3:30 AM

## 2015-12-17 NOTE — ED Notes (Signed)
[facility]

## 2015-12-18 ENCOUNTER — Encounter (HOSPITAL_COMMUNITY): Payer: Self-pay | Admitting: General Practice

## 2015-12-18 DIAGNOSIS — Z794 Long term (current) use of insulin: Secondary | ICD-10-CM

## 2015-12-18 DIAGNOSIS — N184 Chronic kidney disease, stage 4 (severe): Secondary | ICD-10-CM

## 2015-12-18 DIAGNOSIS — E119 Type 2 diabetes mellitus without complications: Secondary | ICD-10-CM

## 2015-12-18 DIAGNOSIS — I2510 Atherosclerotic heart disease of native coronary artery without angina pectoris: Secondary | ICD-10-CM

## 2015-12-18 DIAGNOSIS — I214 Non-ST elevation (NSTEMI) myocardial infarction: Principal | ICD-10-CM

## 2015-12-18 DIAGNOSIS — K219 Gastro-esophageal reflux disease without esophagitis: Secondary | ICD-10-CM

## 2015-12-18 DIAGNOSIS — D649 Anemia, unspecified: Secondary | ICD-10-CM

## 2015-12-18 DIAGNOSIS — D696 Thrombocytopenia, unspecified: Secondary | ICD-10-CM

## 2015-12-18 LAB — IRON AND TIBC
Iron: 43 ug/dL (ref 28–170)
SATURATION RATIOS: 16 % (ref 10.4–31.8)
TIBC: 277 ug/dL (ref 250–450)
UIBC: 234 ug/dL

## 2015-12-18 LAB — GLUCOSE, CAPILLARY
GLUCOSE-CAPILLARY: 193 mg/dL — AB (ref 65–99)
Glucose-Capillary: 178 mg/dL — ABNORMAL HIGH (ref 65–99)
Glucose-Capillary: 162 mg/dL — ABNORMAL HIGH (ref 65–99)
Glucose-Capillary: 156 mg/dL — ABNORMAL HIGH (ref 65–99)
Glucose-Capillary: 170 mg/dL — ABNORMAL HIGH (ref 65–99)

## 2015-12-18 LAB — RETICULOCYTES
RBC.: 3.33 MIL/uL — AB (ref 3.87–5.11)
RETIC CT PCT: 0.9 % (ref 0.4–3.1)
Retic Count, Absolute: 30 10*3/uL (ref 19.0–186.0)

## 2015-12-18 LAB — RAPID URINE DRUG SCREEN, HOSP PERFORMED
Amphetamines: NOT DETECTED
Barbiturates: NOT DETECTED
Benzodiazepines: POSITIVE — AB
Cocaine: NOT DETECTED
OPIATES: NOT DETECTED
Tetrahydrocannabinol: NOT DETECTED

## 2015-12-18 LAB — COMPREHENSIVE METABOLIC PANEL
ALBUMIN: 2.8 g/dL — AB (ref 3.5–5.0)
ALT: 59 U/L — ABNORMAL HIGH (ref 14–54)
ANION GAP: 5 (ref 5–15)
AST: 74 U/L — ABNORMAL HIGH (ref 15–41)
Alkaline Phosphatase: 73 U/L (ref 38–126)
BILIRUBIN TOTAL: 0.5 mg/dL (ref 0.3–1.2)
BUN: 25 mg/dL — ABNORMAL HIGH (ref 6–20)
CO2: 22 mmol/L (ref 22–32)
Calcium: 9.4 mg/dL (ref 8.9–10.3)
Chloride: 114 mmol/L — ABNORMAL HIGH (ref 101–111)
Creatinine, Ser: 2.18 mg/dL — ABNORMAL HIGH (ref 0.44–1.00)
GFR calc Af Amer: 25 mL/min — ABNORMAL LOW (ref >60)
GFR calc non Af Amer: 22 mL/min — ABNORMAL LOW (ref >60)
GLUCOSE: 178 mg/dL — AB (ref 65–99)
POTASSIUM: 3.9 mmol/L (ref 3.5–5.1)
SODIUM: 141 mmol/L (ref 135–145)
TOTAL PROTEIN: 6.3 g/dL — AB (ref 6.5–8.1)

## 2015-12-18 LAB — APTT
APTT: 49 s — AB (ref 24–36)
APTT: 49 s — AB (ref 24–36)
APTT: 53 s — AB (ref 24–36)

## 2015-12-18 LAB — PROTIME-INR
INR: 1.32
PROTHROMBIN TIME: 16.5 s — AB (ref 11.4–15.2)

## 2015-12-18 LAB — LIPID PANEL
CHOL/HDL RATIO: 2.8 ratio
CHOLESTEROL: 81 mg/dL (ref 0–200)
HDL: 29 mg/dL — AB (ref >40)
LDL Cholesterol: 34 mg/dL (ref 0–99)
TRIGLYCERIDES: 89 mg/dL (ref <150)
VLDL: 18 mg/dL (ref 0–40)

## 2015-12-18 LAB — CBC
HCT: 27 % — ABNORMAL LOW (ref 36.0–46.0)
Hemoglobin: 8.6 g/dL — ABNORMAL LOW (ref 12.0–15.0)
MCH: 27.7 pg (ref 26.0–34.0)
MCHC: 31.9 g/dL (ref 30.0–36.0)
MCV: 86.8 fL (ref 78.0–100.0)
PLATELETS: 44 10*3/uL — AB (ref 150–400)
RBC: 3.11 MIL/uL — AB (ref 3.87–5.11)
RDW: 13.3 % (ref 11.5–15.5)
WBC: 10.2 10*3/uL (ref 4.0–10.5)

## 2015-12-18 LAB — VITAMIN B12: VITAMIN B 12: 284 pg/mL (ref 180–914)

## 2015-12-18 LAB — FERRITIN: FERRITIN: 92 ng/mL (ref 11–307)

## 2015-12-18 LAB — LACTATE DEHYDROGENASE: LDH: 361 U/L — ABNORMAL HIGH (ref 98–192)

## 2015-12-18 LAB — FOLATE: Folate: 10.4 ng/mL (ref >5.9)

## 2015-12-18 LAB — TROPONIN I
TROPONIN I: 0.89 ng/mL — AB (ref <0.03)
TROPONIN I: 0.53 ng/mL — AB (ref <0.03)
Troponin I: 1.24 ng/mL (ref <0.03)

## 2015-12-18 LAB — SAVE SMEAR

## 2015-12-18 LAB — HEPARIN LEVEL (UNFRACTIONATED): Heparin Unfractionated: 0.66 IU/mL (ref 0.30–0.70)

## 2015-12-18 LAB — MRSA PCR SCREENING: MRSA by PCR: NEGATIVE

## 2015-12-18 MED ORDER — ASPIRIN 81 MG PO CHEW
81.0000 mg | CHEWABLE_TABLET | Freq: Every day | ORAL | Status: DC
  Administered 2015-12-19 – 2015-12-23 (×5): 81 mg via ORAL
  Filled 2015-12-18 (×5): qty 1

## 2015-12-18 MED ORDER — INSULIN GLARGINE 100 UNIT/ML ~~LOC~~ SOLN
14.0000 [IU] | Freq: Every day | SUBCUTANEOUS | Status: DC
  Filled 2015-12-18: qty 0.14

## 2015-12-18 MED ORDER — INSULIN ASPART 100 UNIT/ML ~~LOC~~ SOLN
0.0000 [IU] | Freq: Four times a day (QID) | SUBCUTANEOUS | Status: DC
  Administered 2015-12-18 – 2015-12-19 (×4): 2 [IU] via SUBCUTANEOUS

## 2015-12-18 MED ORDER — INSULIN GLARGINE 100 UNIT/ML ~~LOC~~ SOLN
10.0000 [IU] | Freq: Every day | SUBCUTANEOUS | Status: DC
  Administered 2015-12-18 – 2015-12-19 (×2): 10 [IU] via SUBCUTANEOUS
  Filled 2015-12-18 (×3): qty 0.1

## 2015-12-18 MED ORDER — ARGATROBAN 50 MG/50ML IV SOLN
1.7000 ug/kg/min | INTRAVENOUS | Status: DC
  Administered 2015-12-18: 1 ug/kg/min via INTRAVENOUS
  Administered 2015-12-18: 1.2 ug/kg/min via INTRAVENOUS
  Administered 2015-12-19: 1.44 ug/kg/min via INTRAVENOUS
  Administered 2015-12-19: 1.7 ug/kg/min via INTRAVENOUS
  Administered 2015-12-19 (×2): 1.2 ug/kg/min via INTRAVENOUS
  Filled 2015-12-18 (×8): qty 50

## 2015-12-18 NOTE — Consult Note (Signed)
WOC Nurse wound consult note Reason for Consult: spinal and sacral wound Wound type: unstageable and surgical full thickness Pressure Ulcer POA: Yes Measurement:spinal wound 7cm x 0.75cm with 50% slough, 50% pink granulation tissue, slight serous drainage, no odor Coccyx wound unstageable 1cm x 0.75cm 90% dark wound bed, 10% pink granulation tissue, no drainage or odor Wound bed:See description above Drainage (amount, consistency, odor) See above Periwound:See description above Dressing procedure/placement/frequency: I have provided nurses with orders for To spinal wound and sacral wound, cleanse with NS, pat dry, cover with NS moistened gauze, cover with dry gauze, adhere with paper tape, change BID. We will not follow, but will remain available to this patient, to nursing, and the medical and/or surgical teams. Please re-consult if we need to assist further.    Barnett HatterMelinda Elise Gladden, RN-C, WTA-C Wound Treatment Associate

## 2015-12-18 NOTE — Progress Notes (Signed)
ANTICOAGULATION CONSULT NOTE - Follow Up Consult  Pharmacy Consult :  Switch to  IV Argatroban Indication: chest pain/ACS/NSTEMI;  ?HIT   Allergies  Allergen Reactions  . Ace Inhibitors Cough  . Ampicillin Other (See Comments)    Sensitive per lab paperwork from facility  . Cefazolin Other (See Comments)    Sensitive per lab paperwork from facility  . Cefepime Other (See Comments)    Sensitive per lab paperwork from facility  . Ceftazidime Other (See Comments)    Sensitive per lab paperwork from facility  . Ceftriaxone Other (See Comments)    Sensitive per lab paperwork from facility  . Ciprofloxacin Other (See Comments)    Sensitive per lab paperwork from facility  . Contrast Media [Iodinated Diagnostic Agents] Other (See Comments)    Reaction unknown  . Ertapenem Other (See Comments)    Sensitive per lab paperwork from facility  . Gentamicin Other (See Comments)    Sensitive per lab paperwork from facility  . Imipenem Other (See Comments)    Sensitive per lab paperwork from facility  . Latex Other (See Comments)    Unknown reaction per MAR   . Levofloxacin Other (See Comments)    Sensitive per lab paperwork from facility  . Nitrofuran Derivatives Other (See Comments)    Sensitive per lab paperwork from facility  . Shellfish Allergy Other (See Comments)    Pt cannot recall at this time  . Tobramycin Other (See Comments)    Sensitive per lab paperwork from facility  . Zosyn [Piperacillin Sod-Tazobactam So] Other (See Comments)    Sensitive per lab paperwork from facility    Patient Measurements: Height: 5\' 3"  (160 cm) Weight: 219 lb (99.3 kg) IBW/kg (Calculated) : 52.4 Heparin Dosing Weight: 76 kg  Vital Signs: Temp: 99.3 F (37.4 C) (11/16 2002) Temp Source: Oral (11/16 2002) BP: 135/55 (11/16 2002) Pulse Rate: 80 (11/16 2002)  Labs:  Recent Labs  12/17/15 1624  12/17/15 2333 12/18/15 0529 12/18/15 0745 12/18/15 1047 12/18/15 1226 12/18/15 1749  12/18/15 2205  HGB 8.9*  --   --  8.6*  --   --   --   --   --   HCT 27.9*  --   --  27.0*  --   --   --   --   --   PLT 51*  --   --  44*  --   --   --   --   --   APTT  --   --   --   --   --   --  49* 49* 53*  LABPROT  --   --  16.5*  --   --   --   --   --   --   INR  --   --  1.32  --   --   --   --   --   --   HEPARINUNFRC  --   --   --   --  0.66  --   --   --   --   CREATININE 2.63*  --   --   --   --  2.18*  --   --   --   TROPONINI  --   < > 1.24* 0.89*  --  0.53*  --   --   --   < > = values in this interval not displayed.  Estimated Creatinine Clearance: 27 mL/min (by C-G formula based on SCr of 2.18  mg/dL (H)).   Assessment: 70 yo F on Argatroban for ACS / R/O HIT. APTT = 53 (therapeutic) on 1.2 mcg/kg/min.   V/Q scan showed low probability for PE  Goal of Therapy:  Goal for Argatroban therap:  aPTT= 50-90 seconds Monitor platelets by anticoagulation protocol: Yes   Plan:   1. Continue argatroban at current of 1.2 mcg/kg/min (817ml/hr) 2. Follow up aPTT in the am    Pollyann SamplesAndy Yitty Roads, PharmD, BCPS 12/18/2015, 11:52 PM Pager: [phone]

## 2015-12-18 NOTE — Progress Notes (Signed)
ANTICOAGULATION CONSULT NOTE - Follow Up Consult  Pharmacy Consult :  Switch to  IV Argatroban Indication: chest pain/ACS/NSTEMI;  ?HIT   Allergies  Allergen Reactions  . Ace Inhibitors Cough  . Ampicillin Other (See Comments)    Sensitive per lab paperwork from facility  . Cefazolin Other (See Comments)    Sensitive per lab paperwork from facility  . Cefepime Other (See Comments)    Sensitive per lab paperwork from facility  . Ceftazidime Other (See Comments)    Sensitive per lab paperwork from facility  . Ceftriaxone Other (See Comments)    Sensitive per lab paperwork from facility  . Ciprofloxacin Other (See Comments)    Sensitive per lab paperwork from facility  . Contrast Media [Iodinated Diagnostic Agents] Other (See Comments)    Reaction unknown  . Ertapenem Other (See Comments)    Sensitive per lab paperwork from facility  . Gentamicin Other (See Comments)    Sensitive per lab paperwork from facility  . Imipenem Other (See Comments)    Sensitive per lab paperwork from facility  . Latex Other (See Comments)    Unknown reaction per MAR   . Levofloxacin Other (See Comments)    Sensitive per lab paperwork from facility  . Nitrofuran Derivatives Other (See Comments)    Sensitive per lab paperwork from facility  . Shellfish Allergy Other (See Comments)    Pt cannot recall at this time  . Tobramycin Other (See Comments)    Sensitive per lab paperwork from facility  . Zosyn [Piperacillin Sod-Tazobactam So] Other (See Comments)    Sensitive per lab paperwork from facility    Patient Measurements: Height: 5\' 3"  (160 cm) Weight: 219 lb (99.3 kg) IBW/kg (Calculated) : 52.4 Heparin Dosing Weight: 76 kg  Vital Signs: Temp: 98 F (36.7 C) (11/16 1542) Temp Source: Oral (11/16 1542) BP: 147/65 (11/16 1612) Pulse Rate: 71 (11/16 1542)  Labs:  Recent Labs  12/17/15 1624  12/17/15 2333 12/18/15 0529 12/18/15 0745 12/18/15 1047 12/18/15 1226 12/18/15 1749  HGB  8.9*  --   --  8.6*  --   --   --   --   HCT 27.9*  --   --  27.0*  --   --   --   --   PLT 51*  --   --  44*  --   --   --   --   APTT  --   --   --   --   --   --  49* 49*  LABPROT  --   --  16.5*  --   --   --   --   --   INR  --   --  1.32  --   --   --   --   --   HEPARINUNFRC  --   --   --   --  0.66  --   --   --   CREATININE 2.63*  --   --   --   --  2.18*  --   --   TROPONINI  --   < > 1.24* 0.89*  --  0.53*  --   --   < > = values in this interval not displayed.  Estimated Creatinine Clearance: 27 mL/min (by C-G formula based on SCr of 2.18 mg/dL (H)).   Assessment: 70 yo F on Argatroban for ACS / R/O HIT. APTT = 49 on 311mcg/kg/min.   V/Q scan showed low probability  for PE  Goal of Therapy:  Goal for Argatroban therap:  aPTT= 50-90 seconds Monitor platelets by anticoagulation protocol: Yes   Plan:   Increase argatroban rate to 1.2 mcg/kg/min (587ml/hr) F/u aPTT at 2200  Bayard HuggerMei Josanne Boerema, PharmD, BCPS  Clinical Pharmacist  Pager: [phone]   12/18/2015,7:04 PM

## 2015-12-18 NOTE — Progress Notes (Addendum)
ANTICOAGULATION CONSULT NOTE - Follow Up Consult  Pharmacy Consult for heparin  Indication: chest pain/ACS  Allergies  Allergen Reactions  . Ace Inhibitors Cough  . Ampicillin Other (See Comments)    Sensitive per lab paperwork from facility  . Cefazolin Other (See Comments)    Sensitive per lab paperwork from facility  . Cefepime Other (See Comments)    Sensitive per lab paperwork from facility  . Ceftazidime Other (See Comments)    Sensitive per lab paperwork from facility  . Ceftriaxone Other (See Comments)    Sensitive per lab paperwork from facility  . Ciprofloxacin Other (See Comments)    Sensitive per lab paperwork from facility  . Contrast Media [Iodinated Diagnostic Agents] Other (See Comments)    Reaction unknown  . Ertapenem Other (See Comments)    Sensitive per lab paperwork from facility  . Gentamicin Other (See Comments)    Sensitive per lab paperwork from facility  . Imipenem Other (See Comments)    Sensitive per lab paperwork from facility  . Latex Other (See Comments)    Unknown reaction per MAR   . Levofloxacin Other (See Comments)    Sensitive per lab paperwork from facility  . Nitrofuran Derivatives Other (See Comments)    Sensitive per lab paperwork from facility  . Shellfish Allergy Other (See Comments)    Pt cannot recall at this time  . Tobramycin Other (See Comments)    Sensitive per lab paperwork from facility  . Zosyn [Piperacillin Sod-Tazobactam So] Other (See Comments)    Sensitive per lab paperwork from facility    Patient Measurements: Height: 5\' 3"  (160 cm) Weight: 219 lb (99.3 kg) IBW/kg (Calculated) : 52.4 Heparin Dosing Weight: 76 kg  Vital Signs: Temp: 98.4 F (36.9 C) (11/16 0804) Temp Source: Oral (11/16 0804) BP: 129/59 (11/16 0942) Pulse Rate: 71 (11/16 0804)  Labs:  Recent Labs  12/17/15 1624 12/17/15 1959 12/17/15 2333 12/18/15 0529 12/18/15 0745  HGB 8.9*  --   --  8.6*  --   HCT 27.9*  --   --  27.0*  --    PLT 51*  --   --  44*  --   LABPROT  --   --  16.5*  --   --   INR  --   --  1.32  --   --   HEPARINUNFRC  --   --   --   --  0.66  CREATININE 2.63*  --   --   --   --   TROPONINI  --  0.96* 1.24* 0.89*  --     Estimated Creatinine Clearance: 22.4 mL/min (by C-G formula based on SCr of 2.63 mg/dL (H)).   Medications:  Prescriptions Prior to Admission  Medication Sig Dispense Refill Last Dose  . acetaminophen (TYLENOL) 325 MG tablet Take 325 mg by mouth 3 (three) times daily.    12/17/2015 at 2 doses  . albuterol (PROVENTIL HFA;VENTOLIN HFA) 108 (90 BASE) MCG/ACT inhaler Inhale 2 puffs into the lungs every 6 (six) hours as needed for wheezing or shortness of breath.   prn  . Amino Acids-Protein Hydrolys (FEEDING SUPPLEMENT, PRO-STAT SUGAR FREE 64,) LIQD Take 30 mLs by mouth 2 (two) times daily.   12/17/2015 at Unknown time  . amLODipine (NORVASC) 10 MG tablet Take 1 tablet (10 mg total) by mouth daily. 30 tablet 1 12/17/2015 at Unknown time  . bisacodyl (DULCOLAX) 10 MG suppository Place 10 mg rectally every 4 (four) hours as needed  for moderate constipation.    prn  . doxazosin (CARDURA) 4 MG tablet take 1 TABLET AT BEDTIME (Patient taking differently: Take 4 mg by mouth at bedtime. ) 30 tablet 3 12/16/2015 at Unknown time  . ezetimibe (ZETIA) 10 MG tablet Take 1 tablet (10 mg total) by mouth daily. (Patient taking differently: Take 10 mg by mouth at bedtime. ) 30 tablet 3 12/16/2015 at Unknown time  . fluticasone (FLONASE) 50 MCG/ACT nasal spray Place 1 spray into both nostrils daily as needed for allergies.    prn  . Fluticasone-Salmeterol (ADVAIR) 100-50 MCG/DOSE AEPB Inhale 1 puff into the lungs 2 (two) times daily.    12/16/2015 at Unknown time  . gabapentin (NEURONTIN) 400 MG capsule Take 400 mg by mouth 3 (three) times daily.   12/17/2015 at am  . hydrALAZINE (APRESOLINE) 50 MG tablet Take 1 tablet by mouth 3 times a day. (Patient taking differently: Take 50 mg by mouth 3 (three)  times daily. ) 90 tablet 11 12/17/2015 at 2 doses  . insulin aspart (NOVOLOG) 100 UNIT/ML injection Inject 10-23 Units into the skin See admin instructions. Takes 10 units twice daily, but if CBG 300-350 give addt'l 5 units and >350 give addt'l 8 units   12/17/2015 at Unknown time  . Insulin Glargine (LANTUS) 100 UNIT/ML Solostar Pen Inject 10 Units into the skin daily at 10 pm. 15 mL 2 12/16/2015 at Unknown time  . nitroGLYCERIN (NITROSTAT) 0.4 MG SL tablet Place 0.4 mg under the tongue every 5 (five) minutes as needed for chest pain.   12/17/2015 at Unknown time  . omeprazole (PRILOSEC) 20 MG capsule Take 20 mg by mouth daily at 6 (six) AM.    12/17/2015 at Unknown time  . ondansetron (ZOFRAN ODT) 4 MG disintegrating tablet Take 1 tablet (4 mg total) by mouth every 8 (eight) hours as needed for nausea or vomiting. 20 tablet 0 prn  . polyethylene glycol (MIRALAX / GLYCOLAX) packet Take 17 g by mouth daily as needed (For constipation.).    prn  . potassium chloride (K-DUR) 10 MEQ tablet Take 1 tablet (10 mEq total) by mouth daily. 30 tablet 0 12/17/2015 at Unknown time  . rosuvastatin (CRESTOR) 40 MG tablet Take 1 tablet (40 mg total) by mouth daily. (Patient taking differently: Take 40 mg by mouth at bedtime. ) 30 tablet 6 12/16/2015 at Unknown time    Assessment: 70 yo F presented on 11/15 with chest pain. Pharmacy consulted to dose heparin.  Initial 8 hour heparin level is 0.66 on heparin drip 1000 units/hr, therapeutic.  CBC low/stable with hgb 8.6, Hct 27, Pltc remains low 51>44.  ? H/o thrombocytopenia noted.  Will f/u  thrombocytopenia / heparin use with cardiologist.    V/Q scan showed low probability for PE  Goal of Therapy:  Heparin level 0.3-0.7 units/ml Monitor platelets by anticoagulation protocol: Yes   Plan:  Continue IV heparin drip rate at 1000 units/hr Repeat 6 hour heparin level to confirm therapeutic x2  Then daily heparin level, CBC.  F/u pltc,  Noah Delaineuth Aniket Paye,  RPh Clinical Pharmacist Pager: [phone] 12/18/2015,10:19 AM   Addendum:  Plan to switch to either Argatorban or Bivalirudin. Will check on our supply .  Dr. Rennis GoldenHilty to order HIT panel.   Noah Delaineuth Drinda Belgard, RPh Clinical Pharmacist Pager: [phone] 12/18/2015 11:12 AM

## 2015-12-18 NOTE — ED Notes (Signed)
Casimiro NeedleMichael, RN attempted report x 1

## 2015-12-18 NOTE — Progress Notes (Signed)
ANTICOAGULATION CONSULT NOTE - Follow Up Consult  Pharmacy Consult :  Switch to  IV Argatroban Indication: chest pain/ACS/NSTEMI;  ?HIT   Allergies  Allergen Reactions  . Ace Inhibitors Cough  . Ampicillin Other (See Comments)    Sensitive per lab paperwork from facility  . Cefazolin Other (See Comments)    Sensitive per lab paperwork from facility  . Cefepime Other (See Comments)    Sensitive per lab paperwork from facility  . Ceftazidime Other (See Comments)    Sensitive per lab paperwork from facility  . Ceftriaxone Other (See Comments)    Sensitive per lab paperwork from facility  . Ciprofloxacin Other (See Comments)    Sensitive per lab paperwork from facility  . Contrast Media [Iodinated Diagnostic Agents] Other (See Comments)    Reaction unknown  . Ertapenem Other (See Comments)    Sensitive per lab paperwork from facility  . Gentamicin Other (See Comments)    Sensitive per lab paperwork from facility  . Imipenem Other (See Comments)    Sensitive per lab paperwork from facility  . Latex Other (See Comments)    Unknown reaction per MAR   . Levofloxacin Other (See Comments)    Sensitive per lab paperwork from facility  . Nitrofuran Derivatives Other (See Comments)    Sensitive per lab paperwork from facility  . Shellfish Allergy Other (See Comments)    Pt cannot recall at this time  . Tobramycin Other (See Comments)    Sensitive per lab paperwork from facility  . Zosyn [Piperacillin Sod-Tazobactam So] Other (See Comments)    Sensitive per lab paperwork from facility    Patient Measurements: Height: 5\' 3"  (160 cm) Weight: 219 lb (99.3 kg) IBW/kg (Calculated) : 52.4 Heparin Dosing Weight: 76 kg  Vital Signs: Temp: 98.4 F (36.9 C) (11/16 0804) Temp Source: Oral (11/16 0804) BP: 129/59 (11/16 0942) Pulse Rate: 71 (11/16 0804)  Labs:  Recent Labs  12/17/15 1624  12/17/15 2333 12/18/15 0529 12/18/15 0745 12/18/15 1047  HGB 8.9*  --   --  8.6*  --   --    HCT 27.9*  --   --  27.0*  --   --   PLT 51*  --   --  44*  --   --   LABPROT  --   --  16.5*  --   --   --   INR  --   --  1.32  --   --   --   HEPARINUNFRC  --   --   --   --  0.66  --   CREATININE 2.63*  --   --   --   --  2.18*  TROPONINI  --   < > 1.24* 0.89*  --  0.53*  < > = values in this interval not displayed.  Estimated Creatinine Clearance: 27 mL/min (by C-G formula based on SCr of 2.18 mg/dL (H)).   Medications:  Prescriptions Prior to Admission  Medication Sig Dispense Refill Last Dose  . acetaminophen (TYLENOL) 325 MG tablet Take 325 mg by mouth 3 (three) times daily.    12/17/2015 at 2 doses  . albuterol (PROVENTIL HFA;VENTOLIN HFA) 108 (90 BASE) MCG/ACT inhaler Inhale 2 puffs into the lungs every 6 (six) hours as needed for wheezing or shortness of breath.   prn  . Amino Acids-Protein Hydrolys (FEEDING SUPPLEMENT, PRO-STAT SUGAR FREE 64,) LIQD Take 30 mLs by mouth 2 (two) times daily.   12/17/2015 at Unknown time  .  amLODipine (NORVASC) 10 MG tablet Take 1 tablet (10 mg total) by mouth daily. 30 tablet 1 12/17/2015 at Unknown time  . bisacodyl (DULCOLAX) 10 MG suppository Place 10 mg rectally every 4 (four) hours as needed for moderate constipation.    prn  . doxazosin (CARDURA) 4 MG tablet take 1 TABLET AT BEDTIME (Patient taking differently: Take 4 mg by mouth at bedtime. ) 30 tablet 3 12/16/2015 at Unknown time  . ezetimibe (ZETIA) 10 MG tablet Take 1 tablet (10 mg total) by mouth daily. (Patient taking differently: Take 10 mg by mouth at bedtime. ) 30 tablet 3 12/16/2015 at Unknown time  . fluticasone (FLONASE) 50 MCG/ACT nasal spray Place 1 spray into both nostrils daily as needed for allergies.    prn  . Fluticasone-Salmeterol (ADVAIR) 100-50 MCG/DOSE AEPB Inhale 1 puff into the lungs 2 (two) times daily.    12/16/2015 at Unknown time  . gabapentin (NEURONTIN) 400 MG capsule Take 400 mg by mouth 3 (three) times daily.   12/17/2015 at am  . hydrALAZINE (APRESOLINE) 50  MG tablet Take 1 tablet by mouth 3 times a day. (Patient taking differently: Take 50 mg by mouth 3 (three) times daily. ) 90 tablet 11 12/17/2015 at 2 doses  . insulin aspart (NOVOLOG) 100 UNIT/ML injection Inject 10-23 Units into the skin See admin instructions. Takes 10 units twice daily, but if CBG 300-350 give addt'l 5 units and >350 give addt'l 8 units   12/17/2015 at Unknown time  . Insulin Glargine (LANTUS) 100 UNIT/ML Solostar Pen Inject 10 Units into the skin daily at 10 pm. 15 mL 2 12/16/2015 at Unknown time  . nitroGLYCERIN (NITROSTAT) 0.4 MG SL tablet Place 0.4 mg under the tongue every 5 (five) minutes as needed for chest pain.   12/17/2015 at Unknown time  . omeprazole (PRILOSEC) 20 MG capsule Take 20 mg by mouth daily at 6 (six) AM.    12/17/2015 at Unknown time  . ondansetron (ZOFRAN ODT) 4 MG disintegrating tablet Take 1 tablet (4 mg total) by mouth every 8 (eight) hours as needed for nausea or vomiting. 20 tablet 0 prn  . polyethylene glycol (MIRALAX / GLYCOLAX) packet Take 17 g by mouth daily as needed (For constipation.).    prn  . potassium chloride (K-DUR) 10 MEQ tablet Take 1 tablet (10 mEq total) by mouth daily. 30 tablet 0 12/17/2015 at Unknown time  . rosuvastatin (CRESTOR) 40 MG tablet Take 1 tablet (40 mg total) by mouth daily. (Patient taking differently: Take 40 mg by mouth at bedtime. ) 30 tablet 6 12/16/2015 at Unknown time    Assessment: 70 yo F presented on 11/15 with chest pain. Pharmacy consulted to dose heparin IV infusion for chestpain /ACS.  Therapeutic heparin level on heparin 1000 units/hr.  Thrombocytopenia noted. 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  Pharmacy consulted to change IV heparin to Argatroban or bivalirudin infusion until result of HIT panel returns.   Also has Acute renal failure, thus will use Argatrogan.   Goal for Argatroban therap:  aPTT= 50-90 seconds   V/Q  scan showed low probability for PE  Goal of Therapy:  Heparin level 0.3-0.7 units/ml Monitor platelets by anticoagulation protocol: Yes   Plan:   Discontinue IV heparin drip. Switch to Argatroban infusion.  STAT aPTT.   When aPTT <90 sec, start Argatroban 681mcg/kg/min Check aPTT 2 hours after argatroban  therapy started and aPTT every 2 hours until two therapeutic levels are obtained.  Then daily aPTT. F/u HIT panel results   Noah Delaine, RPh Clinical Pharmacist Pager: [phone] 12/18/2015,12:10 PM

## 2015-12-18 NOTE — Progress Notes (Signed)
Inpatient Diabetes Program Recommendations  AACE/ADA: New Consensus Statement on Inpatient Glycemic Control (2015)  Target Ranges:  Prepandial:   less than 140 mg/dL      Peak postprandial:   less than 180 mg/dL (1-2 hours)      Critically ill patients:  140 - 180 mg/dL   Results for Heidi Campbell, Heidi Campbell (MRN [id number]) as of 12/18/2015 13:15  Ref. Range 12/18/2015 01:53 12/18/2015 07:53 12/18/2015 11:08  Glucose-Capillary Latest Ref Range: 65 - 99 mg/dL 782178 (H) 956162 (H) 213156 (H)   Review of Glycemic Control  Diabetes history: DM2 Outpatient Diabetes medications: Lantus 10 units QHS, Novolog 10 units BID with meals plus additional 5 units if CBG 300-350 mg/dl or 8 units if > 086350 mg/dl Current orders for Inpatient glycemic control: Lantus 14 units daily, Novolog 0-9 units Q6H  Inpatient Diabetes Program Recommendations: Insulin - Basal: Patient received Lantus 10 units at 2:22 am today and glucose since then has been 162 and 156 mg/dl and no other insulin has been given since then. Noted Lantus was increased from 10 to 14 units this morning. May want to consider decreasing Lantus back down. HgbA1C: A1C 11.2% on 10/30/15 indicating an average glucose of 275 mg/dl. Recommend patient follow up with Dr. Sharl MaKerr soon after discharge for assistance with improving glycemic control.   NOTE: Noted consult for Diabetes Coordinator. Spoke with patient over the phone about diabetes and home regimen for diabetes control. Patient reports that she is followed by Dr. Sharl MaKerr for diabetes management and she seen him last in July or August 2017. Per chart review, noted patient seen Dr. Lucianne MussKumar in the past and last seen him on 03/25/15. Patient states that she changed from Dr. Lucianne MussKumar to Dr. Sharl MaKerr over this summer.  Patient reports she takes Lantus 10 units QHS and Novolog 10 units BID with meals plus additional 5 units if CBG 300-350 mg/dl or 8 units if > 578350 mg/dl as an outpatient for diabetes control. Patient reports that she is  taking insulin as prescribed. Patient states that she checks her glucose 2-3 times per day and that it ranges from 200-300's mg/dl most of the time.  Discussed last A1C results in chart (11.2% on 10/30/15) and explained that her most current A1C indicates an average glucose of 275 mg/dl which correlates with reported glucose values at home. Discussed glucose and A1C goals. Discussed importance of checking CBGs and maintaining good CBG control to prevent long-term and short-term complications.  Stressed to the patient the importance of improving glycemic control to prevent further complications from uncontrolled diabetes. Discussed impact of nutrition, exercise, stress, sickness, and medications on diabetes control. Patient states that she tries to follow Carb Modified diet but she is not able to be very active due to her health.  Encouraged patient to check her glucose as prescribed and to keep a log book of glucose readings and insulin taken which she will need to take to follow up doctor appointments.  Patient verbalized understanding of information discussed and she states that she has no further questions at this time related to diabetes.  Thanks, Orlando PennerMarie Alessandria Henken, RN, MSN, CDE Diabetes Coordinator Inpatient Diabetes Program [phone] (Team Pager)

## 2015-12-18 NOTE — Progress Notes (Addendum)
PROGRESS NOTE    Heidi Campbell  MVH:[id number] DOB: Jan 23, 1946 DOA: 12/17/2015 PCP: Astrid Divine, MD   Brief Narrative:  Heidi Campbell is a 70 y.o. female with medical history significant of hypertension, hyperlipidemia, diabetes mellitus, asthma, GERD, CAD, CKD-IV, arthritis, anemia, who presents with chest pain.  Patient states that her chest pain started this morning. It is located in the substernal area, constant, 7 out of 10 in severity, pressure-like pain. Nonradiating. It is not aggravated or alleviated by any known factors. It is associated with mild shortness of breath. No cough, fever or chills. She has left anterior leg pain, but no tenderness over calf areas. Patient was given aspirin and nitroglycerin. Her chest pain has subsided. Currently no chest pain and shortness of breath. Patient denies nausea, vomiting, abdominal, symptoms for UTI, diarrhea, unilateral weakness. Of note, pt was noted to have A fib with RVR by EMS, but no EKG recorded. Her EKG did not show A fib in ED.   Assessment & Plan:   Principal Problem:   NSTEMI (non-ST elevated myocardial infarction) (HCC) Active Problems:   Type 2 diabetes mellitus treated with insulin (HCC)   Esophageal reflux   Hypertension   Coronary atherosclerosis of native coronary artery   Hyperlipidemia   CKD (chronic kidney disease), stage IV (HCC)   Atypical chest pain   Normocytic anemia   Thrombocytopenia (HCC)  #1 non-ST elevated MI Patient had presented with chest pain relieved with nitroglycerin. Troponin is trending down. Patient currently chest pain-free. Patient recently hospitalized for acute renal failure with critical hyperkalemia and creatinine up to 10 requiring emergent hemodialysis. Patient's renal function improving however has not normalized.. Patient also noted to have a thrombocytopenia with a platelet count of 51 which has worsened and now at 44. Due to thrombocytopenia and current creatinine which  has not yet normalized cardiology hesitant to perform cardiac catheterization on the patient. IV heparin has been changed to argatroban per cardiology. Continue low-dose aspirin at 81 mg daily, zetia, hydralazine, Crestor. Patient would recent 2-D echo done on 12/05/2015 with a EF of 60-65% with mild LVH. Repeat 2-D echo pending. Cardiology following and appreciate input and recommendations.  #2 type 2 diabetes mellitus CBGs have ranged from 162-193. Hemoglobin A1c was 11.2 on 10/30/2015. Hemoglobin A1c pending. Continue Lantus 10 units daily. Continue sliding scale insulin.  #3 recent acute renal failure Patient's last hospitalization from 12/05/2015-12/12/2015 was noted to be in acute renal failure with a creatinine as high as 10. Patient had to go emergent hemodialysis also secondary to severe hyperkalemia. Renal function has been gradually improving however has not normalized yet. Creatinine currently at 2.18. No nephrotoxins. Gentle hydration.  #4 gastroesophageal reflux disease PPI.  #5 thrombocytopenia Concern for HITT, as during last hospitalization patient's platelet count was normal. On admission they lay count of 51. Platelet count today at 44. Hit panel has been ordered per cardiology and IV heparin has been changed to argatroban.LDH slightly elevated at 361. Haptoglobin pending.  #6 hypertension Continue Cardura, Norvasc, hydralazine.  #7 hyperlipidemia Continue Zetia and Crestor.  #8 normocytic anemia Follow H&H.   DVT prophylaxis: argatroban Code Status: Full Family Communication: Updated patient. No family present. Disposition Plan: Pending cardiology evaluation and when thrombocytopenia has improved.   Consultants:   Cardiology: Dr. Rennis Golden 12/18/2015  Procedures:   Chest x-ray 12/17/2015  V/Q scan 12/17/2015  Antimicrobials:   None   Subjective: Patient states chest pain has improved. No shortness of breath. Patient denies any  bleeding.  Objective: Vitals:   12/18/15 0942 12/18/15 1200 12/18/15 1542 12/18/15 1612  BP: (!) 129/59   (!) 147/65  Pulse:  79 71   Resp:  15 17   Temp:  98.3 F (36.8 C) 98 F (36.7 C)   TempSrc:  Oral Oral   SpO2:  98%    Weight:      Height:        Intake/Output Summary (Last 24 hours) at 12/18/15 1814 Last data filed at 12/18/15 1700  Gross per 24 hour  Intake             1058 ml  Output              700 ml  Net              358 ml   Filed Weights   12/18/15 0147  Weight: 99.3 kg (219 lb)    Examination:  General exam: Appears calm and comfortable  Respiratory system: Clear to auscultation. Respiratory effort normal. Cardiovascular system: S1 & S2 heard, RRR. No JVD, murmurs, rubs, gallops or clicks. No pedal edema. Gastrointestinal system: Abdomen is nondistended, soft and nontender. No organomegaly or masses felt. Normal bowel sounds heard. Central nervous system: Alert and oriented. No focal neurological deficits. Extremities: Symmetric 5 x 5 power. Skin: No rashes, lesions or ulcers Psychiatry: Judgement and insight appear normal. Mood & affect appropriate.     Data Reviewed: I have personally reviewed following labs and imaging studies  CBC:  Recent Labs Lab 12/12/15 0500 12/17/15 1624 12/18/15 0529  WBC 11.2* 11.0* 10.2  HGB 8.8* 8.9* 8.6*  HCT 27.7* 27.9* 27.0*  MCV 86.8 86.6 86.8  PLT 226 51* 44*   Basic Metabolic Panel:  Recent Labs Lab 12/12/15 0500 12/12/15 0546 12/17/15 1624 12/18/15 1047  NA 139 138 136 141  K 3.6 3.5 4.0 3.9  CL 109 109 109 114*  CO2 22 22 19* 22  GLUCOSE 125* 140* 320* 178*  BUN 25* 24* 30* 25*  CREATININE 2.81* 2.88* 2.63* 2.18*  CALCIUM 9.5 9.6 9.4 9.4  MG 1.7  --   --   --   PHOS  --  3.1  --   --    GFR: Estimated Creatinine Clearance: 27 mL/min (by C-G formula based on SCr of 2.18 mg/dL (H)). Liver Function Tests:  Recent Labs Lab 12/12/15 0546 12/18/15 1047  AST  --  74*  ALT  --  59*   ALKPHOS  --  73  BILITOT  --  0.5  PROT  --  6.3*  ALBUMIN 2.8* 2.8*   No results for input(s): LIPASE, AMYLASE in the last 168 hours. No results for input(s): AMMONIA in the last 168 hours. Coagulation Profile:  Recent Labs Lab 12/17/15 2333  INR 1.32   Cardiac Enzymes:  Recent Labs Lab 12/17/15 1959 12/17/15 2333 12/18/15 0529 12/18/15 1047  TROPONINI 0.96* 1.24* 0.89* 0.53*   BNP (last 3 results) No results for input(s): PROBNP in the last 8760 hours. HbA1C: No results for input(s): HGBA1C in the last 72 hours. CBG:  Recent Labs Lab 12/12/15 1630 12/18/15 0153 12/18/15 0753 12/18/15 1108 12/18/15 1653  GLUCAP 225* 178* 162* 156* 193*   Lipid Profile:  Recent Labs  12/18/15 0529  CHOL 81  HDL 29*  LDLCALC 34  TRIG 89  CHOLHDL 2.8   Thyroid Function Tests: No results for input(s): TSH, T4TOTAL, FREET4, T3FREE, THYROIDAB in the last 72 hours. Anemia Panel:  Recent Labs  12/17/15 2333  VITAMINB12 284  FOLATE 10.4  FERRITIN 92  TIBC 277  IRON 43  RETICCTPCT 0.9   Sepsis Labs: No results for input(s): PROCALCITON, LATICACIDVEN in the last 168 hours.  Recent Results (from the past 240 hour(s))  MRSA PCR Screening     Status: None   Collection Time: 12/18/15  1:24 AM  Result Value Ref Range Status   MRSA by PCR NEGATIVE NEGATIVE Final    Comment:        The GeneXpert MRSA Assay (FDA approved for NASAL specimens only), is one component of a comprehensive MRSA colonization surveillance program. It is not intended to diagnose MRSA infection nor to guide or monitor treatment for MRSA infections.          Radiology Studies: Dg Chest 2 View  Result Date: 12/17/2015 CLINICAL DATA:  Pt reports dull mid-sternal chest pains that increases with breathing and slight SOB. EXAM: CHEST - 2 VIEW COMPARISON:  12/06/2015 FINDINGS: Lungs are clear. Heart size and mediastinal contours are within normal limits. Atheromatous aorta. No effusion.  Lumbar fusion hardware partially visualized. IMPRESSION: No acute cardiopulmonary disease. Electronically Signed   By: Corlis Leak  Hassell M.D.   On: 12/17/2015 17:05   Nm Pulmonary Perf And Vent  Result Date: 12/17/2015 CLINICAL DATA:  Chest pain and shortness of breath. EXAM: NUCLEAR MEDICINE VENTILATION - PERFUSION LUNG SCAN TECHNIQUE: Ventilation images were obtained in multiple projections using inhaled aerosol Tc-6042m DTPA. Perfusion images were obtained in multiple projections after intravenous injection of Tc-3742m MAA. RADIOPHARMACEUTICALS:  32.9 mCi Technetium-1242m DTPA aerosol inhalation and 4.06 mCi Technetium-2842m MAA IV COMPARISON:  Chest radiograph earlier this day FINDINGS: Ventilation: No focal ventilation defect. Perfusion: Single small (subsegmental) perfusion defect posterior medial left lower lobe. No additional or wedge shaped peripheral perfusion defects to suggest acute pulmonary embolism. IMPRESSION: Low probability for pulmonary embolus. Electronically Signed   By: Rubye OaksMelanie  Ehinger M.D.   On: 12/17/2015 19:47        Scheduled Meds: . amLODipine  10 mg Oral Daily  . [START ON 12/19/2015] aspirin  81 mg Oral Daily  . doxazosin  4 mg Oral QHS  . ezetimibe  10 mg Oral QHS  . feeding supplement (PRO-STAT SUGAR FREE 64)  30 mL Oral BID  . gabapentin  400 mg Oral TID  . hydrALAZINE  50 mg Oral TID  . insulin aspart  0-9 Units Subcutaneous Q6H  . insulin glargine  14 Units Subcutaneous Q2200  . mometasone-formoterol  2 puff Inhalation BID  . pantoprazole  40 mg Oral Daily  . rosuvastatin  40 mg Oral QHS   Continuous Infusions: . sodium chloride 75 mL/hr at 12/18/15 1800  . argatroban 1 mcg/kg/min (12/18/15 1800)     LOS: 1 day    Time spent: 35 mins    Ashonti Leandro, MD [facility] Pager [phone]  If 7PM-7AM, please contact night-coverage [email or web address] Password [email or web address] 12/18/2015, 6:14 PM

## 2015-12-18 NOTE — Consult Note (Signed)
CARDIOLOGY CONSULT NOTE   Patient ID: Heidi Aloevon V Streiff MRN: [id number] DOB/AGE: [birth year] 70 y.o.  Admit date: 12/17/2015  Primary Physician   Astrid DivineGRIFFIN,ELAINE COLLINS, MD Primary Cardiologist   Dr. Eldridge DaceVaranasi Reason for Consultation   Chest Pain Requesting Physician  Dr. Clyde LundborgNiu  HPI: Heidi Campbell is a 70 y.o. female with a history of CAD s/p LHC 2012 (50% proximal LAD plaque), HTN, IDDM, Asthma, GERD, L3-L4 spondylolisthesis s/p decompressive laminectomy/foraminotomies (11/07/2015), and recent ARF who presented to the ED with chest pain, SOB, and fatigue. Patient was at rest at home, sitting in her wheelchair yesterday, when she began to have a sudden onset retrosternal chest pain described as a heaviness without radiation to her arms, neck, jaw, or back. She had associated shortness of breath and numbness/tingling in her fingers. She feels she may have had some sweats. She denied any nausea, vomiting, lightheadedness, falls, syncope, or palpitations. She still makes urine and denies any hematuria, hematochezia, melena, or abnormal bleeding.  She has known CAD s/p LHC in 09/2010 which was notable for moderate proximal LAD disease with 50% plaque and moderate diffuse disease in the remainder of the vessel. She was medically managed without percutaneous intervention.  She was noted to be in Afib with RVR by EMS and given three sublingual NTG and ASA 324 mg with self-conversion to NSR. Her chest pain resolved after NTG by the time she arrived to the ED. Initial point-of-care troponin was 0.51. Troponin I trend has been 0.96 > 1.24 > 0.89. A V/Q scan was obtained which was low probability for PE. She was started on IV heparin for suspected ACS. Of note, her platelet count was 51,000; 44, 000 this morning. SCr is 2.63, trending down.  Of note, patient recently underwent L3/L4 decompressive laminectomy and foraminotomy on 11/07/15. She was later admitted from 11/3-11/10 for AMS and ARF requiring emergent  dialysis for hyperkalemia. She had her RIJ dialysis catheter removed on 12/16/15.  She denies smoking, EtOH, or illicit drug use.   Past Medical History:  Diagnosis Date  . ALLERGIC RHINITIS   . Arthritis   . ASTHMA   . Asthma   . Diabetes mellitus without complication (HCC)   . Esophageal reflux   . Hypertension   . SINUSITIS, ACUTE      Past Surgical History:  Procedure Laterality Date  . ABDOMINAL HYSTERECTOMY    . arthroscopic knee Right 2015  . BACK SURGERY    . BREAST REDUCTION SURGERY Bilateral 2006  . BREAST SURGERY    . HAND SURGERY Left    damaged nerve in hand    Allergies  Allergen Reactions  . Ace Inhibitors Cough  . Ampicillin Other (See Comments)    Sensitive per lab paperwork from facility  . Cefazolin Other (See Comments)    Sensitive per lab paperwork from facility  . Cefepime Other (See Comments)    Sensitive per lab paperwork from facility  . Ceftazidime Other (See Comments)    Sensitive per lab paperwork from facility  . Ceftriaxone Other (See Comments)    Sensitive per lab paperwork from facility  . Ciprofloxacin Other (See Comments)    Sensitive per lab paperwork from facility  . Contrast Media [Iodinated Diagnostic Agents] Other (See Comments)    Reaction unknown  . Ertapenem Other (See Comments)    Sensitive per lab paperwork from facility  . Gentamicin Other (See Comments)    Sensitive per lab paperwork from facility  . Imipenem Other (See Comments)  Sensitive per lab paperwork from facility  . Latex Other (See Comments)    Unknown reaction per MAR   . Levofloxacin Other (See Comments)    Sensitive per lab paperwork from facility  . Nitrofuran Derivatives Other (See Comments)    Sensitive per lab paperwork from facility  . Shellfish Allergy Other (See Comments)    Pt cannot recall at this time  . Tobramycin Other (See Comments)    Sensitive per lab paperwork from facility  . Zosyn [Piperacillin Sod-Tazobactam So] Other (See  Comments)    Sensitive per lab paperwork from facility    I have reviewed the patient's current medications . amLODipine  10 mg Oral Daily  . aspirin  325 mg Oral Daily  . doxazosin  4 mg Oral QHS  . ezetimibe  10 mg Oral QHS  . feeding supplement (PRO-STAT SUGAR FREE 64)  30 mL Oral BID  . gabapentin  400 mg Oral TID  . hydrALAZINE  50 mg Oral TID  . insulin aspart  0-9 Units Subcutaneous Q6H  . insulin glargine  14 Units Subcutaneous Q2200  . mometasone-formoterol  2 puff Inhalation BID  . pantoprazole  40 mg Oral Daily  . rosuvastatin  40 mg Oral QHS   . sodium chloride 75 mL/hr at 12/18/15 0221   acetaminophen, ALPRAZolam, bisacodyl, fluticasone, morphine injection, nitroGLYCERIN, ondansetron (ZOFRAN) IV, ondansetron, polyethylene glycol, technetium TC 61M diethylenetriame-pentaacetic acid, zolpidem  Prior to Admission medications   Medication Sig Start Date End Date Taking? Authorizing Provider  acetaminophen (TYLENOL) 325 MG tablet Take 325 mg by mouth 3 (three) times daily.    Yes Historical Provider, MD  albuterol (PROVENTIL HFA;VENTOLIN HFA) 108 (90 BASE) MCG/ACT inhaler Inhale 2 puffs into the lungs every 6 (six) hours as needed for wheezing or shortness of breath.   Yes Historical Provider, MD  Amino Acids-Protein Hydrolys (FEEDING SUPPLEMENT, PRO-STAT SUGAR FREE 64,) LIQD Take 30 mLs by mouth 2 (two) times daily.   Yes Historical Provider, MD  amLODipine (NORVASC) 10 MG tablet Take 1 tablet (10 mg total) by mouth daily. 07/26/14  Yes Donnetta Hutching, MD  bisacodyl (DULCOLAX) 10 MG suppository Place 10 mg rectally every 4 (four) hours as needed for moderate constipation.    Yes Historical Provider, MD  doxazosin (CARDURA) 4 MG tablet take 1 TABLET AT BEDTIME Patient taking differently: Take 4 mg by mouth at bedtime.  04/15/15  Yes Reather Littler, MD  ezetimibe (ZETIA) 10 MG tablet Take 1 tablet (10 mg total) by mouth daily. Patient taking differently: Take 10 mg by mouth at bedtime.   07/20/13  Yes Reather Littler, MD  fluticasone (FLONASE) 50 MCG/ACT nasal spray Place 1 spray into both nostrils daily as needed for allergies.    Yes Historical Provider, MD  Fluticasone-Salmeterol (ADVAIR) 100-50 MCG/DOSE AEPB Inhale 1 puff into the lungs 2 (two) times daily.    Yes Historical Provider, MD  gabapentin (NEURONTIN) 400 MG capsule Take 400 mg by mouth 3 (three) times daily.   Yes Historical Provider, MD  hydrALAZINE (APRESOLINE) 50 MG tablet Take 1 tablet by mouth 3 times a day. Patient taking differently: Take 50 mg by mouth 3 (three) times daily.  10/17/14  Yes Corky Crafts, MD  insulin aspart (NOVOLOG) 100 UNIT/ML injection Inject 10-23 Units into the skin See admin instructions. Takes 10 units twice daily, but if CBG 300-350 give addt'l 5 units and >350 give addt'l 8 units   Yes Historical Provider, MD  Insulin Glargine (LANTUS) 100  UNIT/ML Solostar Pen Inject 10 Units into the skin daily at 10 pm. 12/12/15  Yes Alison Murray, MD  nitroGLYCERIN (NITROSTAT) 0.4 MG SL tablet Place 0.4 mg under the tongue every 5 (five) minutes as needed for chest pain.   Yes Historical Provider, MD  omeprazole (PRILOSEC) 20 MG capsule Take 20 mg by mouth daily at 6 (six) AM.    Yes Historical Provider, MD  ondansetron (ZOFRAN ODT) 4 MG disintegrating tablet Take 1 tablet (4 mg total) by mouth every 8 (eight) hours as needed for nausea or vomiting. 01/27/15  Yes Shawn C Joy, PA-C  polyethylene glycol (MIRALAX / GLYCOLAX) packet Take 17 g by mouth daily as needed (For constipation.).    Yes Historical Provider, MD  potassium chloride (K-DUR) 10 MEQ tablet Take 1 tablet (10 mEq total) by mouth daily. 12/12/15  Yes Alison Murray, MD  rosuvastatin (CRESTOR) 40 MG tablet Take 1 tablet (40 mg total) by mouth daily. Patient taking differently: Take 40 mg by mouth at bedtime.  12/22/12  Yes Corky Crafts, MD     Social History   Social History  . Marital status: Widowed    Spouse name: N/A  .  Number of children: N/A  . Years of education: N/A   Occupational History  . Not on file.   Social History Main Topics  . Smoking status: Never Smoker  . Smokeless tobacco: Never Used  . Alcohol use No  . Drug use: No  . Sexual activity: Not on file   Other Topics Concern  . Not on file   Social History Narrative  . No narrative on file    Family Status  Relation Status  . Mother Deceased  . Father Deceased   Family History  Problem Relation Age of Onset  . Diabetes Mother   . Heart attack Father   . Heart disease Father       ROS:  Full 14 point review of systems complete and found to be negative unless listed above.  Physical Exam: Blood pressure (!) 129/59, pulse 71, temperature 98.4 F (36.9 C), temperature source Oral, resp. rate 15, height 5\' 3"  (1.6 m), weight 219 lb (99.3 kg), SpO2 100 %.  General: Well developed, well nourished, female in no acute distress Head: EOMI, No xanthomas. Normocephalic and atraumatic. Lungs: Resp regular and unlabored, CTA. Heart: RRR no s3, s4, or murmurs..   Neck: Carotid bruits b/l. No lymphadenopathy.  Abdomen: Bowel sounds present, abdomen soft and non-tender without masses or hernias noted. Msk:  Surgical dressing in place over lumbar spine. Extremities: No clubbing, cyanosis or edema. DP/PT/Radials 2+ and equal bilaterally. Neuro: Alert and oriented X 3. No focal deficits noted. Psych:  Good affect, responds appropriately Skin: No rashes or lesions noted.  Labs:   Lab Results  Component Value Date   WBC 10.2 12/18/2015   HGB 8.6 (L) 12/18/2015   HCT 27.0 (L) 12/18/2015   MCV 86.8 12/18/2015   PLT 44 (L) 12/18/2015    Recent Labs  12/17/15 2333  INR 1.32     Recent Labs Lab 12/12/15 0546 12/17/15 1624  NA 138 136  K 3.5 4.0  CL 109 109  CO2 22 19*  BUN 24* 30*  CREATININE 2.88* 2.63*  CALCIUM 9.6 9.4  GLUCOSE 140* 320*  ALBUMIN 2.8*  --    Magnesium  Date Value Ref Range Status  12/12/2015 1.7  1.7 - 2.4 mg/dL Final    Recent Labs  40/98/11 1959  12/17/15 2333 12/18/15 0529  TROPONINI 0.96* 1.24* 0.89*    Recent Labs  12/17/15 1626 12/17/15 2011  TROPIPOC 0.51* 1.50*   No results found for: PROBNP Lab Results  Component Value Date   CHOL 81 12/18/2015   HDL 29 (L) 12/18/2015   LDLCALC 34 12/18/2015   TRIG 89 12/18/2015   No results found for: DDIMER Lipase  Date/Time Value Ref Range Status  12/04/2011 04:51 PM 50 11 - 59 U/L Final   TSH  Date/Time Value Ref Range Status  05/11/2013 09:11 AM 3.28 0.35 - 5.50 uIU/mL Final   Vitamin B-12  Date/Time Value Ref Range Status  12/17/2015 11:33 PM 284 180 - 914 pg/mL Final    Comment:    (NOTE) This assay is not validated for testing neonatal or myeloproliferative syndrome specimens for Vitamin B12 levels.    Folate  Date/Time Value Ref Range Status  12/17/2015 11:33 PM 10.4 >5.9 ng/mL Final   Ferritin  Date/Time Value Ref Range Status  12/17/2015 11:33 PM 92 11 - 307 ng/mL Final   TIBC  Date/Time Value Ref Range Status  12/17/2015 11:33 PM 277 250 - 450 ug/dL Final   Iron  Date/Time Value Ref Range Status  12/17/2015 11:33 PM 43 28 - 170 ug/dL Final   Retic Ct Pct  Date/Time Value Ref Range Status  12/17/2015 11:33 PM 0.9 0.4 - 3.1 % Final    Echo:  TTE 12/05/2015: - Left ventricle: Wall thickness was increased in a pattern of mild   LVH. Systolic function was normal. The estimated ejection   fraction was in the range of 60% to 65%. - Pulmonary arteries: PA peak pressure: 44 mm Hg (S).  ECG:  NSR, low voltage III and aVF, normal R-wave progression  Radiology:  Dg Chest 2 View  Result Date: 12/17/2015 CLINICAL DATA:  Pt reports dull mid-sternal chest pains that increases with breathing and slight SOB. EXAM: CHEST - 2 VIEW COMPARISON:  12/06/2015 FINDINGS: Lungs are clear. Heart size and mediastinal contours are within normal limits. Atheromatous aorta. No effusion. Lumbar fusion hardware  partially visualized. IMPRESSION: No acute cardiopulmonary disease. Electronically Signed   By: Corlis Leak M.D.   On: 12/17/2015 17:05   Nm Pulmonary Perf And Vent  Result Date: 12/17/2015 CLINICAL DATA:  Chest pain and shortness of breath. EXAM: NUCLEAR MEDICINE VENTILATION - PERFUSION LUNG SCAN TECHNIQUE: Ventilation images were obtained in multiple projections using inhaled aerosol Tc-16m DTPA. Perfusion images were obtained in multiple projections after intravenous injection of Tc-30m MAA. RADIOPHARMACEUTICALS:  32.9 mCi Technetium-21m DTPA aerosol inhalation and 4.06 mCi Technetium-26m MAA IV COMPARISON:  Chest radiograph earlier this day FINDINGS: Ventilation: No focal ventilation defect. Perfusion: Single small (subsegmental) perfusion defect posterior medial left lower lobe. No additional or wedge shaped peripheral perfusion defects to suggest acute pulmonary embolism. IMPRESSION: Low probability for pulmonary embolus. Electronically Signed   By: Rubye Oaks M.D.   On: 12/17/2015 19:47    ASSESSMENT AND PLAN:    Principal Problem:   NSTEMI (non-ST elevated myocardial infarction) (HCC) Active Problems:   Type 2 diabetes mellitus treated with insulin (HCC)   Esophageal reflux   Hypertension   Coronary atherosclerosis of native coronary artery   Hyperlipidemia   CKD (chronic kidney disease), stage IV (HCC)   Atypical chest pain   Normocytic anemia   Thrombocytopenia (HCC)  70 year old female with a history of CAD s/p LHC 2012 (50% proximal LAD plaque), HTN, IDDM, Asthma, GERD, L3-L4 spondylolisthesis s/p decompressive  laminectomy/foraminotomies (11/07/2015), and recent ARF who presented to the ED with chest pain, SOB, and fatigue.  NSTEMI: Patient with known CAD presenting with retrosternal chest heaviness at rest. Troponins elevated to peak of 1.24, now trending down. EKG without acute ischemic changes. She did have recent surgery and is at risk for VTE, however V/Q scan was low  probability for PE. She was also suspected to be in Afib by EMS, however no rhythm strip/EKG captured for review. No atrial fibrillation noted overnight on review of telemetry. She has been started on IV heparin for ACS, however platelets are newly low this admission. Cath may not be feasible at this time given recent renal failure. -Continue prn nitro, morphine -Will d/c heparin; start Argatroban or Bivalirudin per pharmacy based on availability - preference for Bivalirudin   CAD: Known CAD s/p LHC in 09/2010 which was notable for moderate proximal LAD disease with 50% plaque and moderate diffuse disease in the remainder of the vessel. She was medically managed without percutaneous intervention. -On Crestor 40 mg qhs and Ezetimibe 10 mg qhs -Not on Beta Blocker, previously was taking but d/c'ed for low HR   ?Paroxysmal Atrial Fibrillation: CHADSVASc score is 5, 6.7% yearly stroke risk. Unclear if she was truly in atrial fibrillation that self-converted. Suspect this was ischemic related arrhythmia. We will hold off on long-term anticoagulation at this time. -Continue telemetry monitoring  Thrombocytopenia: Platelets 51 > 44. She had previously normal platelet counts with greater than 50% drop this admission. She denies any active bleeding. TTP workup in progress by primary. Currently on IV heparin. Discussed with pharmacy, appears she received 1 dose of heparin with dialysis on 11/9. 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 -d/c heparin as above -Argatroban or Bivalirudin per pharmacy as above -monitor for signs/symptoms of bleeding -Check HIT antibody  Renal Disease: Acute Renal Failure earlier this month s/p emergent HD on 11/3 for hyperkalemia thought secondary to ARB use. Appears renal function continues to recover, creatinine trending down. She is making urine. -Avoid nephrotoxic agents  HTN: BP stable this morning. -On amlodipine, hydralazine  S/p L3/L4 decompressive laminectomy and foraminotomy:  Patient with good strength in LE. WOC consulted for surgical site care. Will need PT/OT during admission.  T2DM: -SSI   Signed: Darreld McleanVishal Majour Frei, MD  [facility] PGY-2 12/18/2015, 11:04 AM

## 2015-12-19 ENCOUNTER — Inpatient Hospital Stay (HOSPITAL_COMMUNITY): Payer: Medicare Other

## 2015-12-19 ENCOUNTER — Encounter (HOSPITAL_COMMUNITY): Payer: Self-pay | Admitting: Interventional Radiology

## 2015-12-19 DIAGNOSIS — I10 Essential (primary) hypertension: Secondary | ICD-10-CM

## 2015-12-19 DIAGNOSIS — I472 Ventricular tachycardia: Secondary | ICD-10-CM

## 2015-12-19 DIAGNOSIS — R778 Other specified abnormalities of plasma proteins: Secondary | ICD-10-CM

## 2015-12-19 DIAGNOSIS — R748 Abnormal levels of other serum enzymes: Secondary | ICD-10-CM

## 2015-12-19 DIAGNOSIS — R5383 Other fatigue: Secondary | ICD-10-CM

## 2015-12-19 DIAGNOSIS — R7989 Other specified abnormal findings of blood chemistry: Secondary | ICD-10-CM

## 2015-12-19 HISTORY — PX: IR GENERIC HISTORICAL: IMG1180011

## 2015-12-19 LAB — COMPREHENSIVE METABOLIC PANEL
ALK PHOS: 62 U/L (ref 38–126)
ALT: 55 U/L — ABNORMAL HIGH (ref 14–54)
ANION GAP: 7 (ref 5–15)
AST: 89 U/L — ABNORMAL HIGH (ref 15–41)
Albumin: 2.6 g/dL — ABNORMAL LOW (ref 3.5–5.0)
BILIRUBIN TOTAL: 0.5 mg/dL (ref 0.3–1.2)
BUN: 24 mg/dL — ABNORMAL HIGH (ref 6–20)
CALCIUM: 8.9 mg/dL (ref 8.9–10.3)
CO2: 21 mmol/L — ABNORMAL LOW (ref 22–32)
Chloride: 111 mmol/L (ref 101–111)
Creatinine, Ser: 1.9 mg/dL — ABNORMAL HIGH (ref 0.44–1.00)
GFR, EST AFRICAN AMERICAN: 30 mL/min — AB (ref >60)
GFR, EST NON AFRICAN AMERICAN: 26 mL/min — AB (ref >60)
Glucose, Bld: 208 mg/dL — ABNORMAL HIGH (ref 65–99)
Potassium: 3.6 mmol/L (ref 3.5–5.1)
Sodium: 139 mmol/L (ref 135–145)
TOTAL PROTEIN: 6.1 g/dL — AB (ref 6.5–8.1)

## 2015-12-19 LAB — CBC WITH DIFFERENTIAL/PLATELET
BASOS ABS: 0 10*3/uL (ref 0.0–0.1)
BASOS PCT: 0 %
Basophils Absolute: 0 10*3/uL (ref 0.0–0.1)
Basophils Relative: 0 %
EOS ABS: 0.2 10*3/uL (ref 0.0–0.7)
Eosinophils Absolute: 0.2 10*3/uL (ref 0.0–0.7)
Eosinophils Relative: 3 %
Eosinophils Relative: 2 %
HEMATOCRIT: 25.9 % — AB (ref 36.0–46.0)
HEMATOCRIT: 24.8 % — AB (ref 36.0–46.0)
HEMOGLOBIN: 8.3 g/dL — AB (ref 12.0–15.0)
HEMOGLOBIN: 7.9 g/dL — AB (ref 12.0–15.0)
LYMPHS ABS: 2.3 10*3/uL (ref 0.7–4.0)
LYMPHS PCT: 29 %
Lymphocytes Relative: 28 %
Lymphs Abs: 2.6 10*3/uL (ref 0.7–4.0)
MCH: 28 pg (ref 26.0–34.0)
MCH: 27.7 pg (ref 26.0–34.0)
MCHC: 32 g/dL (ref 30.0–36.0)
MCHC: 31.9 g/dL (ref 30.0–36.0)
MCV: 87.5 fL (ref 78.0–100.0)
MCV: 87 fL (ref 78.0–100.0)
MONOS PCT: 8 %
Monocytes Absolute: 0.7 10*3/uL (ref 0.1–1.0)
Monocytes Absolute: 0.6 10*3/uL (ref 0.1–1.0)
Monocytes Relative: 7 %
NEUTROS ABS: 4.8 10*3/uL (ref 1.7–7.7)
NEUTROS ABS: 5.9 10*3/uL (ref 1.7–7.7)
NEUTROS PCT: 60 %
NEUTROS PCT: 64 %
Platelets: 47 10*3/uL — ABNORMAL LOW (ref 150–400)
Platelets: 54 10*3/uL — ABNORMAL LOW (ref 150–400)
RBC: 2.96 MIL/uL — AB (ref 3.87–5.11)
RBC: 2.85 MIL/uL — ABNORMAL LOW (ref 3.87–5.11)
RDW: 13.4 % (ref 11.5–15.5)
RDW: 13.1 % (ref 11.5–15.5)
WBC: 8 10*3/uL (ref 4.0–10.5)
WBC: 9.3 10*3/uL (ref 4.0–10.5)

## 2015-12-19 LAB — TSH: TSH: 4.317 u[IU]/mL (ref 0.350–4.500)

## 2015-12-19 LAB — AMMONIA: Ammonia: 16 umol/L (ref 9–35)

## 2015-12-19 LAB — GLUCOSE, CAPILLARY
GLUCOSE-CAPILLARY: 166 mg/dL — AB (ref 65–99)
GLUCOSE-CAPILLARY: 239 mg/dL — AB (ref 65–99)
GLUCOSE-CAPILLARY: 188 mg/dL — AB (ref 65–99)
Glucose-Capillary: 159 mg/dL — ABNORMAL HIGH (ref 65–99)
Glucose-Capillary: 178 mg/dL — ABNORMAL HIGH (ref 65–99)

## 2015-12-19 LAB — HEMOGLOBIN A1C
Hgb A1c MFr Bld: 9.8 % — ABNORMAL HIGH (ref 4.8–5.6)
MEAN PLASMA GLUCOSE: 235 mg/dL

## 2015-12-19 LAB — BASIC METABOLIC PANEL
Anion gap: 6 (ref 5–15)
BUN: 25 mg/dL — AB (ref 6–20)
CHLORIDE: 113 mmol/L — AB (ref 101–111)
CO2: 20 mmol/L — AB (ref 22–32)
CREATININE: 1.95 mg/dL — AB (ref 0.44–1.00)
Calcium: 9.1 mg/dL (ref 8.9–10.3)
GFR calc Af Amer: 29 mL/min — ABNORMAL LOW (ref >60)
GFR calc non Af Amer: 25 mL/min — ABNORMAL LOW (ref >60)
Glucose, Bld: 252 mg/dL — ABNORMAL HIGH (ref 65–99)
POTASSIUM: 4 mmol/L (ref 3.5–5.1)
SODIUM: 139 mmol/L (ref 135–145)

## 2015-12-19 LAB — BLOOD GAS, ARTERIAL
Acid-base deficit: 2.6 mmol/L — ABNORMAL HIGH (ref 0.0–2.0)
BICARBONATE: 21.5 mmol/L (ref 20.0–28.0)
DRAWN BY: 313061
FIO2: 21
O2 SAT: 97 %
PATIENT TEMPERATURE: 98.6
pCO2 arterial: 35.8 mmHg (ref 32.0–48.0)
pH, Arterial: 7.396 (ref 7.350–7.450)
pO2, Arterial: 90.3 mmHg (ref 83.0–108.0)

## 2015-12-19 LAB — PATHOLOGIST SMEAR REVIEW

## 2015-12-19 LAB — MAGNESIUM: MAGNESIUM: 1.8 mg/dL (ref 1.7–2.4)

## 2015-12-19 LAB — APTT
APTT: 47 s — AB (ref 24–36)
APTT: 47 s — AB (ref 24–36)
APTT: 48 s — AB (ref 24–36)

## 2015-12-19 LAB — HEPARIN INDUCED PLATELET AB (HIT ANTIBODY): HEPARIN INDUCED PLT AB: 2.638 {OD_unit} — AB (ref 0.000–0.400)

## 2015-12-19 LAB — TROPONIN I: Troponin I: 0.22 ng/mL (ref <0.03)

## 2015-12-19 LAB — HAPTOGLOBIN: HAPTOGLOBIN: 523 mg/dL — AB (ref 34–200)

## 2015-12-19 MED ORDER — INSULIN ASPART 100 UNIT/ML ~~LOC~~ SOLN
0.0000 [IU] | Freq: Three times a day (TID) | SUBCUTANEOUS | Status: DC
  Administered 2015-12-19: 2 [IU] via SUBCUTANEOUS
  Administered 2015-12-19 – 2015-12-20 (×2): 3 [IU] via SUBCUTANEOUS
  Administered 2015-12-20: 9 [IU] via SUBCUTANEOUS
  Administered 2015-12-20: 5 [IU] via SUBCUTANEOUS
  Administered 2015-12-21: 2 [IU] via SUBCUTANEOUS
  Administered 2015-12-21: 3 [IU] via SUBCUTANEOUS
  Administered 2015-12-21: 5 [IU] via SUBCUTANEOUS
  Administered 2015-12-22: 1 [IU] via SUBCUTANEOUS
  Administered 2015-12-22 – 2015-12-23 (×5): 2 [IU] via SUBCUTANEOUS
  Administered 2015-12-24: 3 [IU] via SUBCUTANEOUS
  Administered 2015-12-24 – 2015-12-25 (×3): 2 [IU] via SUBCUTANEOUS
  Administered 2015-12-25 – 2015-12-26 (×3): 5 [IU] via SUBCUTANEOUS
  Administered 2015-12-26 (×2): 2 [IU] via SUBCUTANEOUS
  Administered 2015-12-27 (×2): 3 [IU] via SUBCUTANEOUS
  Administered 2015-12-27: 2 [IU] via SUBCUTANEOUS

## 2015-12-19 MED ORDER — MAGNESIUM SULFATE 2 GM/50ML IV SOLN
2.0000 g | Freq: Once | INTRAVENOUS | Status: AC
  Administered 2015-12-19: 2 g via INTRAVENOUS
  Filled 2015-12-19: qty 50

## 2015-12-19 MED ORDER — MAGNESIUM SULFATE 2 GM/50ML IV SOLN
INTRAVENOUS | Status: AC
  Filled 2015-12-19: qty 50

## 2015-12-19 MED ORDER — MAGNESIUM SULFATE 50 % IJ SOLN
2.0000 g | Freq: Once | INTRAMUSCULAR | Status: DC
  Filled 2015-12-19: qty 4

## 2015-12-19 MED ORDER — INSULIN ASPART 100 UNIT/ML ~~LOC~~ SOLN
0.0000 [IU] | Freq: Every day | SUBCUTANEOUS | Status: DC
  Administered 2015-12-20: 2 [IU] via SUBCUTANEOUS
  Administered 2015-12-22: 3 [IU] via SUBCUTANEOUS
  Administered 2015-12-23: 2 [IU] via SUBCUTANEOUS

## 2015-12-19 MED ORDER — LIDOCAINE HCL 1 % IJ SOLN
INTRAMUSCULAR | Status: DC | PRN
  Administered 2015-12-19: 5 mL

## 2015-12-19 MED ORDER — ARGATROBAN 50 MG/50ML IV SOLN
2.0400 ug/kg/min | INTRAVENOUS | Status: DC
  Administered 2015-12-20 – 2015-12-23 (×19): 2.04 ug/kg/min via INTRAVENOUS
  Filled 2015-12-19 (×23): qty 50

## 2015-12-19 MED ORDER — LIDOCAINE HCL 1 % IJ SOLN
INTRAMUSCULAR | Status: AC
  Administered 2015-12-19: 14:00:00
  Filled 2015-12-19: qty 20

## 2015-12-19 NOTE — Progress Notes (Signed)
ANTICOAGULATION CONSULT NOTE - Follow Up Consult  Pharmacy Consult:  Argatroban Indication: NSTEMI;  ?HIT   Allergies  Allergen Reactions  . Heparin Other (See Comments)    Heparin antibody positive; SRA pending  . Ace Inhibitors Cough  . Ampicillin Other (See Comments)    Sensitive per lab paperwork from facility  . Cefazolin Other (See Comments)    Sensitive per lab paperwork from facility  . Cefepime Other (See Comments)    Sensitive per lab paperwork from facility  . Ceftazidime Other (See Comments)    Sensitive per lab paperwork from facility  . Ceftriaxone Other (See Comments)    Sensitive per lab paperwork from facility  . Ciprofloxacin Other (See Comments)    Sensitive per lab paperwork from facility  . Contrast Media [Iodinated Diagnostic Agents] Other (See Comments)    Reaction unknown  . Ertapenem Other (See Comments)    Sensitive per lab paperwork from facility  . Gentamicin Other (See Comments)    Sensitive per lab paperwork from facility  . Imipenem Other (See Comments)    Sensitive per lab paperwork from facility  . Latex Other (See Comments)    Unknown reaction per MAR   . Levofloxacin Other (See Comments)    Sensitive per lab paperwork from facility  . Nitrofuran Derivatives Other (See Comments)    Sensitive per lab paperwork from facility  . Shellfish Allergy Other (See Comments)    Pt cannot recall at this time  . Tobramycin Other (See Comments)    Sensitive per lab paperwork from facility  . Zosyn [Piperacillin Sod-Tazobactam So] Other (See Comments)    Sensitive per lab paperwork from facility    Patient Measurements: Height: 5\' 3"  (160 cm) Weight: 223 lb 4.8 oz (101.3 kg) IBW/kg (Calculated) : 52.4  Vital Signs: Temp: 99 F (37.2 C) (11/17 2023) Temp Source: Oral (11/17 2023) BP: 155/72 (11/17 2023) Pulse Rate: 82 (11/17 2023)  Labs:  Recent Labs  12/17/15 2333 12/18/15 0529 12/18/15 0745 12/18/15 1047  12/19/15 0513 12/19/15 0939  12/19/15 1545 12/19/15 1855 12/19/15 2140  HGB  --  8.6*  --   --   --  8.3*  --   --  7.9*  --   HCT  --  27.0*  --   --   --  25.9*  --   --  24.8*  --   PLT  --  44*  --   --   --  47*  --   --  54*  --   APTT  --   --   --   --   < >  --  47* 47*  --  48*  LABPROT 16.5*  --   --   --   --   --   --   --   --   --   INR 1.32  --   --   --   --   --   --   --   --   --   HEPARINUNFRC  --   --  0.66  --   --   --   --   --   --   --   CREATININE  --   --   --  2.18*  --   --  1.95*  --  1.90*  --   TROPONINI 1.24* 0.89*  --  0.53*  --   --   --   --  0.22*  --   < > =  values in this interval not displayed.  Estimated Creatinine Clearance: 31.3 mL/min (by C-G formula based on SCr of 1.9 mg/dL (H)).   Assessment: 70 yo F on Argatroban for NSTEMI, r/o HIT. Patient is a hard stick and now has PICC line.  aPTT = 48 sec on Argatroban infusion 1.7 mcg/kg/min . Goal 50-90sec.  Hgb down 8.3>7.9, pltc improved to 54k.  No bleeding reported. HIT antibody= 2.638; SRA ordered 11/16   Goal of Therapy:  Goal aPTT 50-90 seconds Monitor platelets by anticoagulation protocol: Yes   Plan:   -Increase Argatroban by 20% to 2.04 mcg/kg/min Check aPTT in 2hr- aPTT q2h until therapeutic aPTT x2 then check daily - Follow-up HIT panel - Monitor for s/sx of bleeding  Thank you for allowing pharmacy to be part of this patients care team. Noah Delaineuth Latoia Eyster, RPh Clinical Pharmacist Pager: [phone] 12/19/2015 10:31 PM

## 2015-12-19 NOTE — Progress Notes (Signed)
Patient had a short run of SVT while I was in the room with her. She was using the bedpan and denied any symptoms. Dr. Allena KatzPatel, resident made aware. Will continue to monitor

## 2015-12-19 NOTE — Consult Note (Signed)
Name: Heidi Campbell MRN: [id number] DOB: Aug 22, 1945    ADMISSION DATE:  12/17/2015 CONSULTATION DATE:  12/19/15  REFERRING MD :  Janee Mornhompson  CHIEF COMPLAINT:  Chest pain  BRIEF PATIENT DESCRIPTION: 85F admitted with NSTEMI. PMH significant for IDDM, CKD, GERD, HTN, CAD, thrombocytopenia.  SIGNIFICANT EVENTS  Reported lethargy 11/17 with low grade fever and short runs of what appeared to be V tach on the monitor.   HISTORY OF PRESENT ILLNESS:   Heidi Campbell is a 85F admitted with NSTEMI on 12/17/15 who reportedly became more lethargic today (11/17) with some occasional brief runs of asymptomatic V tach on the monitor. Patient denied chest pain / shortness of breath  / cough / dizziness / lightheadedness / nausea. PMH is significant for IDDM, GERD, HTN, CAD, CKD IV (with recent ARF requiring urgent iHD for hyperkalemia), thrombocytopenia. Since admission she was initially treated with heparin, then changed to argatroban out of concern for HIT, placed on ASA 81, and continued management for known CAD, paroxysmal A fib, CKD IV, HTN, recent L3/4 decompressive laminectomy/foraminotomy, GERD and diabetes.   This evening she and her family report that she just seems tired. She is awake, alert, oriented and briskly responsive. She was laughing with her family when I arrived. She denies chest pain, shortness of breath, cough, sputum, pain, nausea, vomiting, diarrhea, constipation, urinary urgency/frequency/pain. She was unaware that she had a fever last night. She cannot recall being aware of the reported runs of V tach. She desires to be full code and would want to be defibrillated should it be necessary. ABG showed pH 7.396, pCO2 35.8, pO2 90, bicarb 21.5.   PAST MEDICAL HISTORY :   has a past medical history of ALLERGIC RHINITIS; Arthritis; ASTHMA; Asthma; CKD (chronic kidney disease), stage IV (HCC); Diabetes mellitus without complication (HCC); Esophageal reflux; Hypertension; NSTEMI (non-ST  elevated myocardial infarction) (HCC); and SINUSITIS, ACUTE.  has a past surgical history that includes Abdominal hysterectomy; Breast surgery; arthroscopic knee (Right, 2015); Hand surgery (Left); Breast reduction surgery (Bilateral, 2006); Back surgery; ir generic historical (12/19/2015); and ir generic historical (12/19/2015). Prior to Admission medications   Medication Sig Start Date End Date Taking? Authorizing Provider  acetaminophen (TYLENOL) 325 MG tablet Take 325 mg by mouth 3 (three) times daily.    Yes Historical Provider, MD  albuterol (PROVENTIL HFA;VENTOLIN HFA) 108 (90 BASE) MCG/ACT inhaler Inhale 2 puffs into the lungs every 6 (six) hours as needed for wheezing or shortness of breath.   Yes Historical Provider, MD  Amino Acids-Protein Hydrolys (FEEDING SUPPLEMENT, PRO-STAT SUGAR FREE 64,) LIQD Take 30 mLs by mouth 2 (two) times daily.   Yes Historical Provider, MD  amLODipine (NORVASC) 10 MG tablet Take 1 tablet (10 mg total) by mouth daily. 07/26/14  Yes Donnetta HutchingBrian Cook, MD  bisacodyl (DULCOLAX) 10 MG suppository Place 10 mg rectally every 4 (four) hours as needed for moderate constipation.    Yes Historical Provider, MD  doxazosin (CARDURA) 4 MG tablet take 1 TABLET AT BEDTIME Patient taking differently: Take 4 mg by mouth at bedtime.  04/15/15  Yes Reather LittlerAjay Kumar, MD  ezetimibe (ZETIA) 10 MG tablet Take 1 tablet (10 mg total) by mouth daily. Patient taking differently: Take 10 mg by mouth at bedtime.  07/20/13  Yes Reather LittlerAjay Kumar, MD  fluticasone (FLONASE) 50 MCG/ACT nasal spray Place 1 spray into both nostrils daily as needed for allergies.    Yes Historical Provider, MD  Fluticasone-Salmeterol (ADVAIR) 100-50 MCG/DOSE AEPB Inhale 1 puff into the  lungs 2 (two) times daily.    Yes Historical Provider, MD  gabapentin (NEURONTIN) 400 MG capsule Take 400 mg by mouth 3 (three) times daily.   Yes Historical Provider, MD  hydrALAZINE (APRESOLINE) 50 MG tablet Take 1 tablet by mouth 3 times a  day. Patient taking differently: Take 50 mg by mouth 3 (three) times daily.  10/17/14  Yes Corky CraftsJayadeep S Varanasi, MD  insulin aspart (NOVOLOG) 100 UNIT/ML injection Inject 10-23 Units into the skin See admin instructions. Takes 10 units twice daily, but if CBG 300-350 give addt'l 5 units and >350 give addt'l 8 units   Yes Historical Provider, MD  Insulin Glargine (LANTUS) 100 UNIT/ML Solostar Pen Inject 10 Units into the skin daily at 10 pm. 12/12/15  Yes Alison MurrayAlma M Devine, MD  nitroGLYCERIN (NITROSTAT) 0.4 MG SL tablet Place 0.4 mg under the tongue every 5 (five) minutes as needed for chest pain.   Yes Historical Provider, MD  omeprazole (PRILOSEC) 20 MG capsule Take 20 mg by mouth daily at 6 (six) AM.    Yes Historical Provider, MD  ondansetron (ZOFRAN ODT) 4 MG disintegrating tablet Take 1 tablet (4 mg total) by mouth every 8 (eight) hours as needed for nausea or vomiting. 01/27/15  Yes Shawn C Joy, PA-C  polyethylene glycol (MIRALAX / GLYCOLAX) packet Take 17 g by mouth daily as needed (For constipation.).    Yes Historical Provider, MD  potassium chloride (K-DUR) 10 MEQ tablet Take 1 tablet (10 mEq total) by mouth daily. 12/12/15  Yes Alison MurrayAlma M Devine, MD  rosuvastatin (CRESTOR) 40 MG tablet Take 1 tablet (40 mg total) by mouth daily. Patient taking differently: Take 40 mg by mouth at bedtime.  12/22/12  Yes Corky CraftsJayadeep S Varanasi, MD   Allergies  Allergen Reactions  . Heparin Other (See Comments)    Heparin antibody positive; SRA pending  . Ace Inhibitors Cough  . Ampicillin Other (See Comments)    Sensitive per lab paperwork from facility  . Cefazolin Other (See Comments)    Sensitive per lab paperwork from facility  . Cefepime Other (See Comments)    Sensitive per lab paperwork from facility  . Ceftazidime Other (See Comments)    Sensitive per lab paperwork from facility  . Ceftriaxone Other (See Comments)    Sensitive per lab paperwork from facility  . Ciprofloxacin Other (See Comments)     Sensitive per lab paperwork from facility  . Contrast Media [Iodinated Diagnostic Agents] Other (See Comments)    Reaction unknown  . Ertapenem Other (See Comments)    Sensitive per lab paperwork from facility  . Gentamicin Other (See Comments)    Sensitive per lab paperwork from facility  . Imipenem Other (See Comments)    Sensitive per lab paperwork from facility  . Latex Other (See Comments)    Unknown reaction per MAR   . Levofloxacin Other (See Comments)    Sensitive per lab paperwork from facility  . Nitrofuran Derivatives Other (See Comments)    Sensitive per lab paperwork from facility  . Shellfish Allergy Other (See Comments)    Pt cannot recall at this time  . Tobramycin Other (See Comments)    Sensitive per lab paperwork from facility  . Zosyn [Piperacillin Sod-Tazobactam So] Other (See Comments)    Sensitive per lab paperwork from facility    FAMILY HISTORY:  family history includes Diabetes in her mother; Heart attack in her father; Heart disease in her father. SOCIAL HISTORY:  reports that she has never  smoked. She has never used smokeless tobacco. She reports that she does not drink alcohol or use drugs.  REVIEW OF SYSTEMS:   General: no weight change, no fever, no chills Cardiovascular: no chest pain, palpitations, orthopnea, or PND Respiratory: no cough, sputum production, shortness of breath Gastrointestinal: no nausea, vomiting, diarrhea, or constipation Genitourinary: no pain with urination, no foul odor, no frequency, no urgency Musculoskeletal: no joint pain or swelling, no muscle aches Skin: no rashes, sores, or ulcers Endocrine: no blood sugar problems, no thyroid problems Neurologic: no numbness, tingling, dizziness, or visual changes Hematologic: no bleeding, bruising, or clotting problems Psychiatric: no depression or anxiety, no suicidal ideation or intent  SUBJECTIVE:   VITAL SIGNS: Temp:  [98.4 F (36.9 C)-101.1 F (38.4 C)] 99.9 F (37.7  C) (11/17 1822) Pulse Rate:  [74-88] 83 (11/17 1651) Resp:  [10-15] 15 (11/17 1651) BP: (97-167)/(37-68) 167/66 (11/17 1651) SpO2:  [96 %-100 %] 99 % (11/17 1651) Weight:  [101.3 kg (223 lb 4.8 oz)] 101.3 kg (223 lb 4.8 oz) (11/17 0352)  PHYSICAL EXAMINATION:  General Well nourished, well developed, no apparent distress  HEENT No gross abnormalities. Oropharynx clear. Mallampati III. Adequate dentition.   Pulmonary Clear to auscultation bilaterally with no wheezes, rales or ronchi. Good effort, symmetrical expansion.   Cardiovascular Normal rate, regular rhythm. S1, s2. No m/r/g. Distal pulses palpable.  Abdomen Soft, non-tender, non-distended, positive bowel sounds, no palpable organomegaly or masses. Normoresonant to percussion.  Musculoskeletal Free range of motion throughout.   Lymphatics No cervical, supraclavicular or axillary adenopathy.   Neurologic Grossly intact. No focal deficits.   Skin/Integuement No rash, no cyanosis, no clubbing. No edema.      Recent Labs Lab 12/18/15 1047 12/19/15 0939 12/19/15 1855  NA 141 139 139  K 3.9 4.0 3.6  CL 114* 113* 111  CO2 22 20* 21*  BUN 25* 25* 24*  CREATININE 2.18* 1.95* 1.90*  GLUCOSE 178* 252* 208*    Recent Labs Lab 12/18/15 0529 12/19/15 0513 12/19/15 1855  HGB 8.6* 8.3* 7.9*  HCT 27.0* 25.9* 24.8*  WBC 10.2 8.0 9.3  PLT 44* 47* 54*   Ir Fluoro Guide Cv Line Right  Result Date: 12/19/2015 CLINICAL DATA:  Poor venous access.  Chronic kidney disease. EXAM: RIGHT IJ CENTRAL VENOUS CATHETER PLACEMENT UNDER ULTRASOUND AND FLUOROSCOPIC GUIDANCE FLUOROSCOPY TIME:  Less than 6 seconds TECHNIQUE: The procedure, risks (including but not limited to bleeding, infection, organ damage ), benefits, and alternatives were explained to the patient. Questions regarding the procedure were encouraged and answered. The patient understands and consents to the procedure. Patency of the right IJ vein was confirmed with ultrasound with  image documentation. An appropriate skin site was determined. Skin site was marked. Region was prepped using maximum barrier technique including cap and mask, sterile gown, sterile gloves, large sterile sheet, and Chlorhexidine as cutaneous antisepsis. The region was infiltrated locally with 1% lidocaine. Under real-time ultrasound guidance, the right IJ vein was accessed with a 21 gauge micropuncture needle; the needle tip within the vein was confirmed with ultrasound image documentation. The needle exchanged over a guidewire for peel-away sheath through which an 18cm 5 [place] PowerPICC dual lumen catheter was advanced. This was positioned with the tip at the cavoatrial junction. Spot chest radiograph shows good positioning and no pneumothorax. Catheter was flushed and sutured externally with 0-Prolene sutures. Patient tolerated the procedure well. COMPLICATIONS: None immediate IMPRESSION: 1. Technically successful right IJ double lumen power injectable central venous catheter placement. Electronically  Signed   By: Corlis Leak M.D.   On: 12/19/2015 14:05   Ir US Guide Vasc Access Right  Result Date: 12/19/2015 CLINICAL DATA:  Poor venous access.  Chronic kidney disease. EXAM: RIGHT IJ CENTRAL VENOUS CATHETER PLACEMENT UNDER ULTRASOUND AND FLUOROSCOPIC GUIDANCE FLUOROSCOPY TIME:  Less than 6 seconds TECHNIQUE: The procedure, risks (including but not limited to bleeding, infection, organ damage ), benefits, and alternatives were explained to the patient. Questions regarding the procedure were encouraged and answered. The patient understands and consents to the procedure. Patency of the right IJ vein was confirmed with ultrasound with image documentation. An appropriate skin site was determined. Skin site was marked. Region was prepped using maximum barrier technique including cap and mask, sterile gown, sterile gloves, large sterile sheet, and Chlorhexidine as cutaneous antisepsis. The region was infiltrated  locally with 1% lidocaine. Under real-time ultrasound guidance, the right IJ vein was accessed with a 21 gauge micropuncture needle; the needle tip within the vein was confirmed with ultrasound image documentation. The needle exchanged over a guidewire for peel-away sheath through which an 18cm 5 [place] PowerPICC dual lumen catheter was advanced. This was positioned with the tip at the cavoatrial junction. Spot chest radiograph shows good positioning and no pneumothorax. Catheter was flushed and sutured externally with 0-Prolene sutures. Patient tolerated the procedure well. COMPLICATIONS: None immediate IMPRESSION: 1. Technically successful right IJ double lumen power injectable central venous catheter placement. Electronically Signed   By: Corlis Leak M.D.   On: 12/19/2015 14:05    ASSESSMENT / PLAN:  Heidi Campbell is a pleasant 70F admitted with NSTEMI (troponin peak 1.24 on 11/16), currently on argatroban for a/c, who reportedly was thought to be more lethargic and noted to have short runs of V tach (asymptomatic) on the monitor. Cardiology is following. She will move to the ICU for closer monitoring. Labs are pending.  1) V tach - asymptomatic and brief; possibly artifact. Monitor in ICU. After discussion with cards, no antiarrrhythmic for now. Lytes pending; correct as needed.  2) NSTEMI - continue a/c, ASA, statin 3) CKD IV - making adequate urine. Monitor lytes, avoid nephrotoxins. Nephrology is consulted to optimize for possible LHC next week. Cr. currently 1.9 from 2.63 on admission. 4) CAD (50% LAD lesion) - no angina at present; monitor. Continue current therapy 5) IDDM - continue CBGs/insulin 6) HTN - continue current meds 7) Thrombocytopenia - monitor 8) HLD - continue statin, Zetia   Pulmonary and Critical Care Medicine [facility] Pager: [phone]  12/19/2015, 7:55 PM

## 2015-12-19 NOTE — Progress Notes (Signed)
Patient Name: Heidi Campbell Date of Encounter: 12/19/2015  Primary Cardiologist: Dr. Alanda Slim Problem List     Principal Problem:   NSTEMI (non-ST elevated myocardial infarction) [facility]) Active Problems:   Type 2 diabetes mellitus treated with insulin (HCC)   Esophageal reflux   Hypertension   Coronary atherosclerosis of native coronary artery   Hyperlipidemia   CKD (chronic kidney disease), stage IV (HCC)   Atypical chest pain   Normocytic anemia   Thrombocytopenia (HCC)     Subjective   She is sleepy, but otherwise feels well this morning, denies any further chest pain or shortness of breath. She had an elevated temp of 101.63F overnight, denies subjective fever or chills.  Inpatient Medications    Scheduled Meds: . amLODipine  10 mg Oral Daily  . aspirin  81 mg Oral Daily  . doxazosin  4 mg Oral QHS  . ezetimibe  10 mg Oral QHS  . feeding supplement (PRO-STAT SUGAR FREE 64)  30 mL Oral BID  . gabapentin  400 mg Oral TID  . hydrALAZINE  50 mg Oral TID  . insulin aspart  0-9 Units Subcutaneous Q6H  . insulin glargine  10 Units Subcutaneous Q2200  . mometasone-formoterol  2 puff Inhalation BID  . pantoprazole  40 mg Oral Daily  . rosuvastatin  40 mg Oral QHS   Continuous Infusions: . sodium chloride 75 mL/hr at 12/19/15 0513  . argatroban 1.2 mcg/kg/min (12/19/15 0742)   PRN Meds: acetaminophen, ALPRAZolam, bisacodyl, fluticasone, morphine injection, nitroGLYCERIN, ondansetron (ZOFRAN) IV, ondansetron, polyethylene glycol, technetium TC 72M diethylenetriame-pentaacetic acid, zolpidem   Vital Signs    Vitals:   12/18/15 2045 12/19/15 0012 12/19/15 0352 12/19/15 0731  BP:  (!) 125/54 (!) 97/37 (!) 141/53  Pulse:  88 74 78  Resp:   12 10  Temp:  (!) 101.1 F (38.4 C) 99 F (37.2 C) 98.4 F (36.9 C)  TempSrc:  Oral Oral Oral  SpO2: 96% 98% 97% 100%  Weight:   223 lb 4.8 oz (101.3 kg)   Height:        Intake/Output Summary (Last 24 hours) at  12/19/15 0813 Last data filed at 12/19/15 0500  Gross per 24 hour  Intake          2591.84 ml  Output              700 ml  Net          1891.84 ml   Filed Weights   12/18/15 0147 12/19/15 0352  Weight: 219 lb (99.3 kg) 223 lb 4.8 oz (101.3 kg)    Physical Exam    General: resting in bed, no acute distress Cardiac: RRR, systolic murmur heard at upper sternal borders, DP pulses +2 b/l Pulm: clear to auscultation bilaterally, moving normal volumes of air Abd: soft, nondistended Ext: warm and well perfused, no pedal edema Neuro: alert and oriented X3   Labs    CBC  Recent Labs  12/18/15 0529 12/19/15 0513  WBC 10.2 8.0  NEUTROABS  --  4.8  HGB 8.6* 8.3*  HCT 27.0* 25.9*  MCV 86.8 87.5  PLT 44* 47*   Basic Metabolic Panel  Recent Labs  12/17/15 1624 12/18/15 1047  NA 136 141  K 4.0 3.9  CL 109 114*  CO2 19* 22  GLUCOSE 320* 178*  BUN 30* 25*  CREATININE 2.63* 2.18*  CALCIUM 9.4 9.4   Liver Function Tests  Recent Labs  12/18/15 1047  AST  74*  ALT 59*  ALKPHOS 73  BILITOT 0.5  PROT 6.3*  ALBUMIN 2.8*   No results for input(s): LIPASE, AMYLASE in the last 72 hours. Cardiac Enzymes  Recent Labs  12/17/15 2333 12/18/15 0529 12/18/15 1047  TROPONINI 1.24* 0.89* 0.53*   BNP Invalid input(s): POCBNP D-Dimer No results for input(s): DDIMER in the last 72 hours. Hemoglobin A1C  Recent Labs  12/18/15 0529  HGBA1C 9.8*   Fasting Lipid Panel  Recent Labs  12/18/15 0529  CHOL 81  HDL 29*  LDLCALC 34  TRIG 89  CHOLHDL 2.8   Thyroid Function Tests No results for input(s): TSH, T4TOTAL, T3FREE, THYROIDAB in the last 72 hours.  Invalid input(s): FREET3  Telemetry    NSR, low voltage, few PVCs, short run sinus tach - Personally Reviewed  ECG    11/16: NSR, low voltage III and aVF, normal R-wave progression - Personally Reviewed  Radiology    Dg Chest 2 View  Result Date: 12/17/2015 CLINICAL DATA:  Pt reports dull mid-sternal  chest pains that increases with breathing and slight SOB. EXAM: CHEST - 2 VIEW COMPARISON:  12/06/2015 FINDINGS: Lungs are clear. Heart size and mediastinal contours are within normal limits. Atheromatous aorta. No effusion. Lumbar fusion hardware partially visualized. IMPRESSION: No acute cardiopulmonary disease. Electronically Signed   By: Corlis Leak  Hassell M.D.   On: 12/17/2015 17:05   Nm Pulmonary Perf And Vent  Result Date: 12/17/2015 CLINICAL DATA:  Chest pain and shortness of breath. EXAM: NUCLEAR MEDICINE VENTILATION - PERFUSION LUNG SCAN TECHNIQUE: Ventilation images were obtained in multiple projections using inhaled aerosol Tc-1762m DTPA. Perfusion images were obtained in multiple projections after intravenous injection of Tc-8662m MAA. RADIOPHARMACEUTICALS:  32.9 mCi Technetium-962m DTPA aerosol inhalation and 4.06 mCi Technetium-362m MAA IV COMPARISON:  Chest radiograph earlier this day FINDINGS: Ventilation: No focal ventilation defect. Perfusion: Single small (subsegmental) perfusion defect posterior medial left lower lobe. No additional or wedge shaped peripheral perfusion defects to suggest acute pulmonary embolism. IMPRESSION: Low probability for pulmonary embolus. Electronically Signed   By: Rubye OaksMelanie  Ehinger M.D.   On: 12/17/2015 19:47    Cardiac Studies   TTE 12/05/2015: - Left ventricle: Wall thickness was increased in a pattern of mild LVH. Systolic function was normal. The estimated ejection fraction was in the range of 60% to 65%. - Pulmonary arteries: PA peak pressure: 44 mm Hg (S).  Patient Profile     70 year old female with a history of CAD s/p LHC 2012 (50% proximal LAD plaque), HTN, IDDM, Asthma, GERD, L3-L4 spondylolisthesis s/p decompressive laminectomy/foraminotomies (11/07/2015), and recent ARF who presented to the ED with chest pain, SOB, and fatigue found to have NSTEMI.  Assessment & Plan    NSTEMI: Troponin peaked to 1.24 before trending down. EKG without acute  ischemic changes.  Cath may not be feasible at this time given recent renal failure and thrombocytopenia. 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 -Continue Argatroban per pharmacy -Continue prn nitro -Continue ASA 81 mg daily -Monitor for renal recovery and improved platelet count  CAD: Known CAD s/p LHC in 09/2010 which was notable for moderate proximal LAD disease with 50% plaque and moderate diffuse disease in the remainder of the vessel. She was medically managed without percutaneous intervention. -Continue Aspirin 81 mg daily -Continue Crestor 40 mg qhs and Ezetimibe 10 mg qhs -Not on Beta Blocker, previously was taking but d/c'ed for low HR   ?Paroxysmal Atrial Fibrillation: CHADSVASc  score is 5, 6.7% yearly stroke risk. Unclear if she was truly in atrial fibrillation that self-converted. Suspect this was ischemic related arrhythmia. We will hold off on long-term anticoagulation at this time. -Continue telemetry monitoring  Thrombocytopenia: Platelets 51 > 44 > 47. She had previously normal platelet counts with greater than 50% drop this admission. 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 Pathology smear review shows thrombocytopenia with normocytic anemia. -Continue Argatroban per pharmacy -monitor for signs/symptoms of bleeding -f/u HIT antibody  Renal Disease: Acute Renal Failure earlier this month s/p emergent HD on 11/3 for hyperkalemia thought secondary to ARB use and hypotension/volume depletion.  -Avoid nephrotoxic agents  HTN: BP stable this morning, hypotensive overnight. -On amlodipine 10 mg daily, hydralazine 50 mg TID, dozaxosin 4 mg qhs  S/p L3/L4 decompressive laminectomy and foraminotomy: Patient with good strength in LE. WOC consulted for surgical site care.  T2DM: Hgb A1c 9.8 -SSI and Lantus per primary  Signed, Darreld McleanVishal Cambrea Kirt, MD  [facility] PGY-2 12/19/2015, 8:13 AM

## 2015-12-19 NOTE — Progress Notes (Signed)
ANTICOAGULATION CONSULT NOTE - Follow Up Consult  Pharmacy Consult:  Argatroban Indication: NSTEMI;  ?HIT   Allergies  Allergen Reactions  . Heparin Other (See Comments)    Heparin antibody positive; SRA pending  . Ace Inhibitors Cough  . Ampicillin Other (See Comments)    Sensitive per lab paperwork from facility  . Cefazolin Other (See Comments)    Sensitive per lab paperwork from facility  . Cefepime Other (See Comments)    Sensitive per lab paperwork from facility  . Ceftazidime Other (See Comments)    Sensitive per lab paperwork from facility  . Ceftriaxone Other (See Comments)    Sensitive per lab paperwork from facility  . Ciprofloxacin Other (See Comments)    Sensitive per lab paperwork from facility  . Contrast Media [Iodinated Diagnostic Agents] Other (See Comments)    Reaction unknown  . Ertapenem Other (See Comments)    Sensitive per lab paperwork from facility  . Gentamicin Other (See Comments)    Sensitive per lab paperwork from facility  . Imipenem Other (See Comments)    Sensitive per lab paperwork from facility  . Latex Other (See Comments)    Unknown reaction per MAR   . Levofloxacin Other (See Comments)    Sensitive per lab paperwork from facility  . Nitrofuran Derivatives Other (See Comments)    Sensitive per lab paperwork from facility  . Shellfish Allergy Other (See Comments)    Pt cannot recall at this time  . Tobramycin Other (See Comments)    Sensitive per lab paperwork from facility  . Zosyn [Piperacillin Sod-Tazobactam So] Other (See Comments)    Sensitive per lab paperwork from facility    Patient Measurements: Height: 5\' 3"  (160 cm) Weight: 223 lb 4.8 oz (101.3 kg) IBW/kg (Calculated) : 52.4  Vital Signs: Temp: 98.9 F (37.2 C) (11/17 1133) Temp Source: Oral (11/17 1133) BP: 154/68 (11/17 1133) Pulse Rate: 80 (11/17 1133)  Labs:  Recent Labs  12/17/15 1624  12/17/15 2333 12/18/15 0529 12/18/15 0745 12/18/15 1047   12/18/15 2205 12/19/15 0513 12/19/15 0939 12/19/15 1545  HGB 8.9*  --   --  8.6*  --   --   --   --  8.3*  --   --   HCT 27.9*  --   --  27.0*  --   --   --   --  25.9*  --   --   PLT 51*  --   --  44*  --   --   --   --  47*  --   --   APTT  --   --   --   --   --   --   < > 53*  --  47* 47*  LABPROT  --   --  16.5*  --   --   --   --   --   --   --   --   INR  --   --  1.32  --   --   --   --   --   --   --   --   HEPARINUNFRC  --   --   --   --  0.66  --   --   --   --   --   --   CREATININE 2.63*  --   --   --   --  2.18*  --   --   --  1.95*  --  TROPONINI  --   < > 1.24* 0.89*  --  0.53*  --   --   --   --   --   < > = values in this interval not displayed.  Estimated Creatinine Clearance: 30.5 mL/min (by C-G formula based on SCr of 1.95 mg/dL (H)).   Assessment: 70 yo F on Argatroban for NSTEMI, r/o HIT. aPTT remains low at 47 sec despite increase to 1.44 mcg/kg/min. Patient is a hard stick and MD has requested placement of PICC.  No bleeding noted, Hgb is low stable, platelets increased slightly to 47 but will watch trend.  V/Q scan showed low probability for PE.  Goal of Therapy:  Goal aPTT 50-90 seconds Monitor platelets by anticoagulation protocol: Yes   Plan:   - Increase argatroban to 1.7 mcg/kg/min - 2 hr aPTT  - aPTT q2h until therapeutic aPTT x2 then check daily - Follow-up HIT panel - Monitor for s/sx of bleeding   Toys 'R' UsKimberly Kasaundra Fahrney, Pharm.D., BCPS Clinical Pharmacist Pager [phone] 12/19/2015 4:29 PM

## 2015-12-19 NOTE — Progress Notes (Signed)
Was called per nursing that patient was lethargic and not doing well. Per nursing notes also concern that patient had a 14 beat run of V. tach. Rapid response was called to bedside. Came and assessed the patient. Patient was alert denied any chest pain or shortness of breath and patient was following commands.  Gen.: Laying in bed. No acute distress Respiratory: Clear to auscultation bilaterally anterior lung fields. Cardiovascular: Regular rate rhythm no murmurs rubs or gallops Abdomen: Soft nontender nondistended positive bowel sounds Extremities: No clubbing cyanosis or edema.  Telemetry with concerns with V. tach versus artifact.  Assessment/plan #1 lethargy Questionable etiology. Concern for an arrhythmia as patient was noted to have runs of V. tach on telemetry. Patient denies any chest pain or shortness of breath. Check a CT head. Check ABG. Check a comprehensive metabolic profile. Check a CBC with differential, magnesium level, TSH, ammonia level, cycle cardiac enzymes every 6 hours 3, EKG. Continue IV fluids. Follow.  #2 V. tach Concerns on telemetry for ventricular tachycardia. Patient also noted to be lethargic. Patient admitted with chest pain and non-STEMI. EKG obtained with no widened QRS complex. Cardiology, PA at bedside assessing the patient as well and reviewing telemetry strips and EKG.?/? Artifact. Check a compressive metabolic profile. Check a magnesium level. Check a TSH. Cycle cardiac enzymes every 6 hours 3. IV fluids. We'll give magnesium 2 g IV 1. Follow. Cardiology following and appreciate input and recommendations.  Will transfer patient to the ICU for closer monitoring. Dr. Hosie PoissonSumner of pulmonary and critical care has been informed of transfer patient to the ICU.

## 2015-12-19 NOTE — Progress Notes (Signed)
PROGRESS NOTE    Heidi Campbell  JYN:[id number] DOB: 1945/08/18 DOA: 12/17/2015 PCP: Astrid DivineGRIFFIN,ELAINE COLLINS, MD   Brief Narrative:  Heidi Campbell is a 70 y.o. female with medical history significant of hypertension, hyperlipidemia, diabetes mellitus, asthma, GERD, CAD, CKD-IV, arthritis, anemia, who presents with chest pain.  Patient states that her chest pain started this morning. It is located in the substernal area, constant, 7 out of 10 in severity, pressure-like pain. Nonradiating. It is not aggravated or alleviated by any known factors. It is associated with mild shortness of breath. No cough, fever or chills. She has left anterior leg pain, but no tenderness over calf areas. Patient was given aspirin and nitroglycerin. Her chest pain has subsided. Currently no chest pain and shortness of breath. Patient denies nausea, vomiting, abdominal, symptoms for UTI, diarrhea, unilateral weakness. Of note, pt was noted to have A fib with RVR by EMS, but no EKG recorded. Her EKG did not show A fib in ED.   Assessment & Plan:   Principal Problem:   NSTEMI (non-ST elevated myocardial infarction) (HCC) Active Problems:   Type 2 diabetes mellitus treated with insulin (HCC)   Esophageal reflux   Hypertension   Coronary atherosclerosis of native coronary artery   Hyperlipidemia   CKD (chronic kidney disease), stage IV (HCC)   Atypical chest pain   Normocytic anemia   Thrombocytopenia (HCC)   Elevated troponin  #1 non-ST elevated MI Patient had presented with chest pain relieved with nitroglycerin. Troponin is trending down. Patient currently chest pain-free. Patient recently hospitalized for acute renal failure with critical hyperkalemia and creatinine up to 10 requiring emergent hemodialysis. Patient's renal function improving however has not normalized.. Patient also noted to have a thrombocytopenia with a platelet count of 51 which has worsened and now at 44. Due to thrombocytopenia and  current creatinine which has not yet normalized cardiology hesitant to perform cardiac catheterization on the patient. IV heparin has been changed to argatroban per cardiology. Continue low-dose aspirin at 81 mg daily, zetia, hydralazine, Crestor. Patient would recent 2-D echo done on 12/05/2015 with a EF of 60-65% with mild LVH. Repeat 2-D echo pending. Cardiology planning on probable cardiac Right position for further evaluation next week and requesting nephrology be consulted for optimizing of renal function and a such will consult with nephrology. Cardiology following and appreciate input and recommendations.  #2 type 2 diabetes mellitus CBGs have ranged from 159-293. Hemoglobin A1c was 11.2 on 10/30/2015. Hemoglobin A1c 9.8. Continue Lantus 10 units daily. Continue sliding scale insulin.  #3 recent acute renal failure Patient's last hospitalization from 12/05/2015-12/12/2015 was noted to be in acute renal failure with a creatinine as high as 10. Patient had to go emergent hemodialysis also secondary to severe hyperkalemia. Renal function has been gradually improving however has not normalized yet. Creatinine currently at 1.95 from 2.18. No nephrotoxins. Gentle hydration. Patient presented with a non-STEMI patient likely needing cardiac catheterization to be done early next week per cardiology was requesting nephrology consultation to optimize patient's renal function.  #4 gastroesophageal reflux disease PPI.  #5 thrombocytopenia Concern for HITT, as during last hospitalization patient's platelet count was normal. On admission they lay count of 51. Platelet count today at 47 from 44. HITT panel has been ordered per cardiology and pending. IV heparin has been changed to argatroban.LDH slightly elevated at 361. Haptoglobin elevated at 523.  #6 hypertension Continue Cardura, Norvasc, hydralazine.  #7 hyperlipidemia Continue Zetia and Crestor.  #8 normocytic anemia Follow H&H.  DVT  prophylaxis: argatroban Code Status: Full Family Communication: Updated patient. No family present. Disposition Plan: Pending cardiology evaluation and when thrombocytopenia has improved.   Consultants:   Cardiology: Dr. Rennis GoldenHilty 12/18/2015  Procedures:   Chest x-ray 12/17/2015  V/Q scan 12/17/2015  Antimicrobials:   None   Subjective: Patient laying in bed. Patient denies chest pain. No shortness of breath. No bleeding.   Objective: Vitals:   12/19/15 0012 12/19/15 0352 12/19/15 0731 12/19/15 1006  BP: (!) 125/54 (!) 97/37 (!) 141/53   Pulse: 88 74 78   Resp:  12 10   Temp: (!) 101.1 F (38.4 C) 99 F (37.2 C) 98.4 F (36.9 C)   TempSrc: Oral Oral Oral   SpO2: 98% 97% 100% 100%  Weight:  101.3 kg (223 lb 4.8 oz)    Height:        Intake/Output Summary (Last 24 hours) at 12/19/15 1119 Last data filed at 12/19/15 0845  Gross per 24 hour  Intake          2589.84 ml  Output                0 ml  Net          2589.84 ml   Filed Weights   12/18/15 0147 12/19/15 0352  Weight: 99.3 kg (219 lb) 101.3 kg (223 lb 4.8 oz)    Examination:  General exam: Appears calm and comfortable  Respiratory system: Clear to auscultation Anterior lung fields. Respiratory effort normal. Cardiovascular system: S1 & S2 heard, RRR. No JVD, murmurs, rubs, gallops or clicks. No pedal edema. Gastrointestinal system: Abdomen is nondistended, soft and nontender. No organomegaly or masses felt. Normal bowel sounds heard. Central nervous system: Alert and oriented. No focal neurological deficits. Extremities: Symmetric 5 x 5 power. Skin: No rashes, lesions or ulcers Psychiatry: Judgement and insight appear normal. Mood & affect appropriate.     Data Reviewed: I have personally reviewed following labs and imaging studies  CBC:  Recent Labs Lab 12/17/15 1624 12/18/15 0529 12/19/15 0513  WBC 11.0* 10.2 8.0  NEUTROABS  --   --  4.8  HGB 8.9* 8.6* 8.3*  HCT 27.9* 27.0* 25.9*  MCV 86.6  86.8 87.5  PLT 51* 44* 47*   Basic Metabolic Panel:  Recent Labs Lab 12/17/15 1624 12/18/15 1047 12/19/15 0939  NA 136 141 139  K 4.0 3.9 4.0  CL 109 114* 113*  CO2 19* 22 20*  GLUCOSE 320* 178* 252*  BUN 30* 25* 25*  CREATININE 2.63* 2.18* 1.95*  CALCIUM 9.4 9.4 9.1   GFR: Estimated Creatinine Clearance: 30.5 mL/min (by C-G formula based on SCr of 1.95 mg/dL (H)). Liver Function Tests:  Recent Labs Lab 12/18/15 1047  AST 74*  ALT 59*  ALKPHOS 73  BILITOT 0.5  PROT 6.3*  ALBUMIN 2.8*   No results for input(s): LIPASE, AMYLASE in the last 168 hours. No results for input(s): AMMONIA in the last 168 hours. Coagulation Profile:  Recent Labs Lab 12/17/15 2333  INR 1.32   Cardiac Enzymes:  Recent Labs Lab 12/17/15 1959 12/17/15 2333 12/18/15 0529 12/18/15 1047  TROPONINI 0.96* 1.24* 0.89* 0.53*   BNP (last 3 results) No results for input(s): PROBNP in the last 8760 hours. HbA1C:  Recent Labs  12/18/15 0529  HGBA1C 9.8*   CBG:  Recent Labs Lab 12/18/15 1108 12/18/15 1653 12/18/15 2005 12/19/15 0205 12/19/15 0730  GLUCAP 156* 193* 170* 166* 159*   Lipid Profile:  Recent Labs  12/18/15 0529  CHOL 81  HDL 29*  LDLCALC 34  TRIG 89  CHOLHDL 2.8   Thyroid Function Tests: No results for input(s): TSH, T4TOTAL, FREET4, T3FREE, THYROIDAB in the last 72 hours. Anemia Panel:  Recent Labs  12/17/15 2333  VITAMINB12 284  FOLATE 10.4  FERRITIN 92  TIBC 277  IRON 43  RETICCTPCT 0.9   Sepsis Labs: No results for input(s): PROCALCITON, LATICACIDVEN in the last 168 hours.  Recent Results (from the past 240 hour(s))  MRSA PCR Screening     Status: None   Collection Time: 12/18/15  1:24 AM  Result Value Ref Range Status   MRSA by PCR NEGATIVE NEGATIVE Final    Comment:        The GeneXpert MRSA Assay (FDA approved for NASAL specimens only), is one component of a comprehensive MRSA colonization surveillance program. It is not intended  to diagnose MRSA infection nor to guide or monitor treatment for MRSA infections.          Radiology Studies: Dg Chest 2 View  Result Date: 12/17/2015 CLINICAL DATA:  Pt reports dull mid-sternal chest pains that increases with breathing and slight SOB. EXAM: CHEST - 2 VIEW COMPARISON:  12/06/2015 FINDINGS: Lungs are clear. Heart size and mediastinal contours are within normal limits. Atheromatous aorta. No effusion. Lumbar fusion hardware partially visualized. IMPRESSION: No acute cardiopulmonary disease. Electronically Signed   By: Corlis Leak M.D.   On: 12/17/2015 17:05   Nm Pulmonary Perf And Vent  Result Date: 12/17/2015 CLINICAL DATA:  Chest pain and shortness of breath. EXAM: NUCLEAR MEDICINE VENTILATION - PERFUSION LUNG SCAN TECHNIQUE: Ventilation images were obtained in multiple projections using inhaled aerosol Tc-52m DTPA. Perfusion images were obtained in multiple projections after intravenous injection of Tc-76m MAA. RADIOPHARMACEUTICALS:  32.9 mCi Technetium-81m DTPA aerosol inhalation and 4.06 mCi Technetium-42m MAA IV COMPARISON:  Chest radiograph earlier this day FINDINGS: Ventilation: No focal ventilation defect. Perfusion: Single small (subsegmental) perfusion defect posterior medial left lower lobe. No additional or wedge shaped peripheral perfusion defects to suggest acute pulmonary embolism. IMPRESSION: Low probability for pulmonary embolus. Electronically Signed   By: Rubye Oaks M.D.   On: 12/17/2015 19:47        Scheduled Meds: . amLODipine  10 mg Oral Daily  . aspirin  81 mg Oral Daily  . doxazosin  4 mg Oral QHS  . ezetimibe  10 mg Oral QHS  . feeding supplement (PRO-STAT SUGAR FREE 64)  30 mL Oral BID  . gabapentin  400 mg Oral TID  . hydrALAZINE  50 mg Oral TID  . insulin aspart  0-9 Units Subcutaneous Q6H  . insulin glargine  10 Units Subcutaneous Q2200  . mometasone-formoterol  2 puff Inhalation BID  . pantoprazole  40 mg Oral Daily  .  rosuvastatin  40 mg Oral QHS   Continuous Infusions: . sodium chloride 75 mL/hr at 12/19/15 0513  . argatroban 1.2 mcg/kg/min (12/19/15 0742)     LOS: 2 days    Time spent: 35 mins    Keyira Mondesir, MD [facility] Pager [phone]  If 7PM-7AM, please contact night-coverage [email or web address] Password [email or web address] 12/19/2015, 11:19 AM

## 2015-12-19 NOTE — Progress Notes (Addendum)
Paged by nursing staff due to abnormal telemetry and telemetry shows possible wide complex tachycardia, however rhythm is not the usual ventricular morphology. The time of the event was 18:45. Per nursing staff, patient was somnolent, however easily arousable and is slightly confused.  On my arrival, nephrology and the hospitalist service is already checking on the patient. She is alert and oriented. She denies any chest discomfort or shortness of breath. She denies any symptom of presyncope. Physical exam is benign, heart rate regular, no significant murmur noticed. No lower extremity edema, no JVD, abdomen is soft and nontender.  As I was talking to my attending right outside the room, I was informed by hospitalist service the patient had another episode at 19:07 p.m. telemetry has been reviewed and as this appears to be more artifactual in nature. I have discussed with my attending Dr. Delton SeeNelson, we have also obtained a EKG which did not show any significant ischemia. QTC was 437 millisecond. Patient had this point is asymptomatic. Dr. Delton SeeNelson has also reviewed the initial event strip with Dr. Ladona Ridgelaylor who felt it is artifact. Patient is being transferred to ICU for further monitoring at this time. We recommended to replace magnesium, we will hold off on initiating any IV amiodarone as the etiology of her initial presentation is unclear. Patient and Dr. Janee Mornhompson has been informed of our decision. There is no acute indication for emergent cardiac catheterization this time, we will need to allow her renal function and her platelet to recover before considering invasive studies. She is positive for HIT.   Ramond DialSigned, Nanna Ertle PA Pager: [phone]

## 2015-12-19 NOTE — Significant Event (Signed)
Rapid Response Event Note  Overview:  Called for a second set of eyes Time Called: 1818 Arrival Time: 1825 Event Type: Other (Comment)  Initial Focused Assessment:  Called by RN for a second set of eyes.  Patient is lying in bed, somnolent, easily arousable, slightly confused.  No focal deficits noted.  VSS, Temp 99. Skin warm and dry, follows commands  Interventions:  MD has been updated and new orders recieved  Plan of Care (if not transferred):  RN to follow through orders and call if assistance needed  Event Summary:  RRT to follow up on labs   at      at          [facility]fe, Heidi Amosenise Campbell

## 2015-12-19 NOTE — Progress Notes (Signed)
Patient ID: Heidi Campbell, female   DOB: 12/30/1945, 70 y.o.   MRN: [id number]   Request for central line placement for access per Dr Janee Mornhompson  Need for IJ access secondary acute renal failure  Pt aware of procedure and agreeable to proceed I discussed with son Ritchie via phone---also agreeable  Consent signed andin chart

## 2015-12-19 NOTE — Progress Notes (Addendum)
ANTICOAGULATION CONSULT NOTE - Follow Up Consult  Pharmacy Consult:  Argatroban Indication: NSTEMI;  ?HIT   Allergies  Allergen Reactions  . Ace Inhibitors Cough  . Ampicillin Other (See Comments)    Sensitive per lab paperwork from facility  . Cefazolin Other (See Comments)    Sensitive per lab paperwork from facility  . Cefepime Other (See Comments)    Sensitive per lab paperwork from facility  . Ceftazidime Other (See Comments)    Sensitive per lab paperwork from facility  . Ceftriaxone Other (See Comments)    Sensitive per lab paperwork from facility  . Ciprofloxacin Other (See Comments)    Sensitive per lab paperwork from facility  . Contrast Media [Iodinated Diagnostic Agents] Other (See Comments)    Reaction unknown  . Ertapenem Other (See Comments)    Sensitive per lab paperwork from facility  . Gentamicin Other (See Comments)    Sensitive per lab paperwork from facility  . Imipenem Other (See Comments)    Sensitive per lab paperwork from facility  . Latex Other (See Comments)    Unknown reaction per MAR   . Levofloxacin Other (See Comments)    Sensitive per lab paperwork from facility  . Nitrofuran Derivatives Other (See Comments)    Sensitive per lab paperwork from facility  . Shellfish Allergy Other (See Comments)    Pt cannot recall at this time  . Tobramycin Other (See Comments)    Sensitive per lab paperwork from facility  . Zosyn [Piperacillin Sod-Tazobactam So] Other (See Comments)    Sensitive per lab paperwork from facility    Patient Measurements: Height: 5\' 3"  (160 cm) Weight: 223 lb 4.8 oz (101.3 kg) IBW/kg (Calculated) : 52.4  Vital Signs: Temp: 98.4 F (36.9 C) (11/17 0731) Temp Source: Oral (11/17 0731) BP: 141/53 (11/17 0731) Pulse Rate: 78 (11/17 0731)  Labs:  Recent Labs  12/17/15 1624  12/17/15 2333 12/18/15 0529 12/18/15 0745 12/18/15 1047  12/18/15 1749 12/18/15 2205 12/19/15 0513 12/19/15 0939  HGB 8.9*  --   --  8.6*   --   --   --   --   --  8.3*  --   HCT 27.9*  --   --  27.0*  --   --   --   --   --  25.9*  --   PLT 51*  --   --  44*  --   --   --   --   --  47*  --   APTT  --   --   --   --   --   --   < > 49* 53*  --  47*  LABPROT  --   --  16.5*  --   --   --   --   --   --   --   --   INR  --   --  1.32  --   --   --   --   --   --   --   --   HEPARINUNFRC  --   --   --   --  0.66  --   --   --   --   --   --   CREATININE 2.63*  --   --   --   --  2.18*  --   --   --   --  1.95*  TROPONINI  --   < > 1.24* 0.89*  --  0.53*  --   --   --   --   --   < > = values in this interval not displayed.  Estimated Creatinine Clearance: 30.5 mL/min (by C-G formula based on SCr of 1.95 mg/dL (H)).   Assessment: 70 yo F on Argatroban for NSTEMI, r/o HIT. Patient had access issues this morning but aPTT is low at 47 sec on 1.2 mcg/kg/min. This may be an ongoing problem. No bleeding noted, Hgb is low stable, platelets increased slightly to 47 but will watch trend.  V/Q scan showed low probability for PE.  Goal of Therapy:  Goal aPTT 50-90 seconds Monitor platelets by anticoagulation protocol: Yes   Plan:   - Increase argatroban to 1.44 mcg/kg/min - 2 hr aPTT  - aPTT q2h until therapeutic aPTT x2 then check daily - Follow-up HIT panel - Monitor for s/sx of bleeding   Loura BackJennifer [place], PharmD, BCPS Clinical Pharmacist Phone for today [phone] Main pharmacy - [phone] 12/19/2015 11:11 AM

## 2015-12-19 NOTE — Progress Notes (Signed)
Over the course of the day patient has been very sleepy and although prompted to wake up and eat she would still interact with staff. However this evening at dinner time, pt had a hard time keeping her eyes open and was not able to focus. Neuro intact, pupils equal and reactive, grips and pushes in arms equal , legs strength equal and no drift in any extremities. Pt stated she "doesnt feel like herself." Vital signs stable but patient continued to fall asleep. She was just not acted herself and this evening seemed to be a bit of a change. Dr. Janee Mornhompson notified and labs received. As I was drawing the labs, pt had 17 beats of VTACH, still saying that she doesn't feel good. Kidney specialist Dr. Signe ColtUpton happen to come in at this time and Dr. Janee Mornhompson as well as Wynema BirchHao, [place]. Orders received and followed through. Patients family at bedside and updated.

## 2015-12-19 NOTE — Consult Note (Signed)
[facility] Consult Note     Date: 12/19/2015                  Patient Name:  Heidi Campbell  MRN: [id number]  DOB: May 26, 1945  Age / Sex: 70 y.o., female         PCP: Osborne Casco, MD                 Service Requesting Consult: Cardiology- Dr. Debara Pickett                 Reason for Consult: Previous AKI and need for cath            Chief Complaint: chest pain  HPI: Pt is a 37F with a PMH significant for HTN, HLD, DM 2 and a recent AKI with creatinine of 10 which required one session of emergent dialysis earlier this month (cr downtrending to 1.9 now) who is now seen in consultation at the request of Dr. Debara Pickett for evaluation and recommendations surrounding resolving AKI and need for cath.    Pt presented to [facility] for chest pain and ultimately was found to have an NSTEMI with troponins peaking at 1.24.  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  Upon my arrival to the room, pt had nonsustained Vtach on the tele monitor.  She had a Tmax of 101.1 last night.  Overall appears confused.  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  Now on argatroban gtt.  Per cards, smear without schistos.  Past Medical History:  Diagnosis Date  . ALLERGIC RHINITIS   . Arthritis   . ASTHMA   . Asthma   . CKD (chronic kidney disease), stage IV ([place])   . Diabetes mellitus without complication ([place])   . Esophageal reflux   . Hypertension   . NSTEMI (non-ST elevated myocardial infarction) ([place])   . SINUSITIS, ACUTE     Past Surgical History:  Procedure Laterality Date  . ABDOMINAL HYSTERECTOMY    . arthroscopic knee Right 2015  . BACK SURGERY    . BREAST REDUCTION SURGERY Bilateral 2006  . BREAST SURGERY    . HAND SURGERY Left    damaged nerve in hand  . IR GENERIC HISTORICAL  12/19/2015   IR FLUORO GUIDE CV LINE RIGHT 12/19/2015 Arne Cleveland, MD [facility]  . IR GENERIC HISTORICAL  12/19/2015   IR US GUIDE VASC ACCESS RIGHT [facility]    Family  History  Problem Relation Age of Onset  . Diabetes Mother   . Heart attack Father   . Heart disease Father    Social History:  reports that she has never smoked. She has never used smokeless tobacco. She reports that she does not drink alcohol or use drugs.  Allergies:  Allergies  Allergen Reactions  . Heparin Other (See Comments)    Heparin antibody positive; SRA pending  . Ace Inhibitors Cough  . Ampicillin Other (See Comments)    Sensitive per lab paperwork from facility  . Cefazolin Other (See Comments)    Sensitive per lab paperwork from facility  . Cefepime Other (See Comments)    Sensitive per lab paperwork from facility  . Ceftazidime Other (See Comments)    Sensitive per lab paperwork from facility  . Ceftriaxone Other (See Comments)    Sensitive per lab paperwork from facility  . Ciprofloxacin Other (See Comments)    Sensitive per lab paperwork from facility  . Contrast Media [Iodinated Diagnostic  Agents] Other (See Comments)    Reaction unknown  . Ertapenem Other (See Comments)    Sensitive per lab paperwork from facility  . Gentamicin Other (See Comments)    Sensitive per lab paperwork from facility  . Imipenem Other (See Comments)    Sensitive per lab paperwork from facility  . Latex Other (See Comments)    Unknown reaction per MAR   . Levofloxacin Other (See Comments)    Sensitive per lab paperwork from facility  . Nitrofuran Derivatives Other (See Comments)    Sensitive per lab paperwork from facility  . Shellfish Allergy Other (See Comments)    Pt cannot recall at this time  . Tobramycin Other (See Comments)    Sensitive per lab paperwork from facility  . Zosyn [Piperacillin Sod-Tazobactam So] Other (See Comments)    Sensitive per lab paperwork from facility    Medications Prior to Admission  Medication Sig Dispense Refill  . acetaminophen (TYLENOL) 325 MG tablet Take 325 mg by mouth 3 (three) times daily.     Marland Kitchen albuterol (PROVENTIL HFA;VENTOLIN HFA)  108 (90 BASE) MCG/ACT inhaler Inhale 2 puffs into the lungs every 6 (six) hours as needed for wheezing or shortness of breath.    . Amino Acids-Protein Hydrolys (FEEDING SUPPLEMENT, PRO-STAT SUGAR FREE 64,) LIQD Take 30 mLs by mouth 2 (two) times daily.    Marland Kitchen amLODipine (NORVASC) 10 MG tablet Take 1 tablet (10 mg total) by mouth daily. 30 tablet 1  . bisacodyl (DULCOLAX) 10 MG suppository Place 10 mg rectally every 4 (four) hours as needed for moderate constipation.     Marland Kitchen doxazosin (CARDURA) 4 MG tablet take 1 TABLET AT BEDTIME (Patient taking differently: Take 4 mg by mouth at bedtime. ) 30 tablet 3  . ezetimibe (ZETIA) 10 MG tablet Take 1 tablet (10 mg total) by mouth daily. (Patient taking differently: Take 10 mg by mouth at bedtime. ) 30 tablet 3  . fluticasone (FLONASE) 50 MCG/ACT nasal spray Place 1 spray into both nostrils daily as needed for allergies.     . Fluticasone-Salmeterol (ADVAIR) 100-50 MCG/DOSE AEPB Inhale 1 puff into the lungs 2 (two) times daily.     Marland Kitchen gabapentin (NEURONTIN) 400 MG capsule Take 400 mg by mouth 3 (three) times daily.    . hydrALAZINE (APRESOLINE) 50 MG tablet Take 1 tablet by mouth 3 times a day. (Patient taking differently: Take 50 mg by mouth 3 (three) times daily. ) 90 tablet 11  . insulin aspart (NOVOLOG) 100 UNIT/ML injection Inject 10-23 Units into the skin See admin instructions. Takes 10 units twice daily, but if CBG 300-350 give addt'l 5 units and >350 give addt'l 8 units    . Insulin Glargine (LANTUS) 100 UNIT/ML Solostar Pen Inject 10 Units into the skin daily at 10 pm. 15 mL 2  . nitroGLYCERIN (NITROSTAT) 0.4 MG SL tablet Place 0.4 mg under the tongue every 5 (five) minutes as needed for chest pain.    Marland Kitchen omeprazole (PRILOSEC) 20 MG capsule Take 20 mg by mouth daily at 6 (six) AM.     . ondansetron (ZOFRAN ODT) 4 MG disintegrating tablet Take 1 tablet (4 mg total) by mouth every 8 (eight) hours as needed for nausea or vomiting. 20 tablet 0  . polyethylene  glycol (MIRALAX / GLYCOLAX) packet Take 17 g by mouth daily as needed (For constipation.).     Marland Kitchen potassium chloride (K-DUR) 10 MEQ tablet Take 1 tablet (10 mEq total) by mouth daily. 30 tablet  0  . rosuvastatin (CRESTOR) 40 MG tablet Take 1 tablet (40 mg total) by mouth daily. (Patient taking differently: Take 40 mg by mouth at bedtime. ) 30 tablet 6    Results for orders placed or performed during the hospital encounter of 12/17/15 (from the past 48 hour(s))  Protime-INR     Status: Abnormal   Collection Time: 12/17/15 11:33 PM  Result Value Ref Range   Prothrombin Time 16.5 (H) 11.4 - 15.2 seconds   INR 1.32   Troponin I (q 6hr x 3)     Status: Abnormal   Collection Time: 12/17/15 11:33 PM  Result Value Ref Range   Troponin I 1.24 (HH) <0.03 ng/mL    Comment: CRITICAL VALUE NOTED.  VALUE IS CONSISTENT WITH PREVIOUSLY REPORTED AND CALLED VALUE.  Lactate dehydrogenase     Status: Abnormal   Collection Time: 12/17/15 11:33 PM  Result Value Ref Range   LDH 361 (H) 98 - 192 U/L  Save smear     Status: None   Collection Time: 12/17/15 11:33 PM  Result Value Ref Range   Smear Review SMEAR STAINED AND AVAILABLE FOR REVIEW   Vitamin B12     Status: None   Collection Time: 12/17/15 11:33 PM  Result Value Ref Range   Vitamin B-12 284 180 - 914 pg/mL    Comment: (NOTE) This assay is not validated for testing neonatal or myeloproliferative syndrome specimens for Vitamin B12 levels.   Folate     Status: None   Collection Time: 12/17/15 11:33 PM  Result Value Ref Range   Folate 10.4 >5.9 ng/mL  Iron and TIBC     Status: None   Collection Time: 12/17/15 11:33 PM  Result Value Ref Range   Iron 43 28 - 170 ug/dL   TIBC 277 250 - 450 ug/dL   Saturation Ratios 16 10.4 - 31.8 %   UIBC 234 ug/dL  Ferritin     Status: None   Collection Time: 12/17/15 11:33 PM  Result Value Ref Range   Ferritin 92 11 - 307 ng/mL  Reticulocytes     Status: Abnormal   Collection Time: 12/17/15 11:33 PM   Result Value Ref Range   Retic Ct Pct 0.9 0.4 - 3.1 %   RBC. 3.33 (L) 3.87 - 5.11 MIL/uL   Retic Count, Manual 30.0 19.0 - 186.0 K/uL  MRSA PCR Screening     Status: None   Collection Time: 12/18/15  1:24 AM  Result Value Ref Range   MRSA by PCR NEGATIVE NEGATIVE    Comment:        The GeneXpert MRSA Assay (FDA approved for NASAL specimens only), is one component of a comprehensive MRSA colonization surveillance program. It is not intended to diagnose MRSA infection nor to guide or monitor treatment for MRSA infections.   Glucose, capillary     Status: Abnormal   Collection Time: 12/18/15  1:53 AM  Result Value Ref Range   Glucose-Capillary 178 (H) 65 - 99 mg/dL  CBC     Status: Abnormal   Collection Time: 12/18/15  5:29 AM  Result Value Ref Range   WBC 10.2 4.0 - 10.5 K/uL   RBC 3.11 (L) 3.87 - 5.11 MIL/uL   Hemoglobin 8.6 (L) 12.0 - 15.0 g/dL   HCT 27.0 (L) 36.0 - 46.0 %   MCV 86.8 78.0 - 100.0 fL   MCH 27.7 26.0 - 34.0 pg   MCHC 31.9 30.0 - 36.0 g/dL   RDW 13.3  11.5 - 15.5 %   Platelets 44 (L) 150 - 400 K/uL    Comment: CONSISTENT WITH PREVIOUS RESULT  Hemoglobin A1c     Status: Abnormal   Collection Time: 12/18/15  5:29 AM  Result Value Ref Range   Hgb A1c MFr Bld 9.8 (H) 4.8 - 5.6 %    Comment: (NOTE)         Pre-diabetes: 5.7 - 6.4         Diabetes: >6.4         Glycemic control for adults with diabetes: <7.0    Mean Plasma Glucose 235 mg/dL    Comment: (NOTE) Performed At: [facility] [place], [place] [id number] Lindon Romp MD [id number]   Lipid panel     Status: Abnormal   Collection Time: 12/18/15  5:29 AM  Result Value Ref Range   Cholesterol 81 0 - 200 mg/dL   Triglycerides 89 <150 mg/dL   HDL 29 (L) >40 mg/dL   Total CHOL/HDL Ratio 2.8 RATIO   VLDL 18 0 - 40 mg/dL   LDL Cholesterol 34 0 - 99 mg/dL    Comment:        Total Cholesterol/HDL:CHD Risk Coronary Heart Disease Risk Table                     Men    Women  1/2 Average Risk   3.4   3.3  Average Risk       5.0   4.4  2 X Average Risk   9.6   7.1  3 X Average Risk  23.4   11.0        Use the calculated Patient Ratio above and the CHD Risk Table to determine the patient's CHD Risk.        ATP III CLASSIFICATION (LDL):  <100     mg/dL   Optimal  100-129  mg/dL   Near or Above                    Optimal  130-159  mg/dL   Borderline  160-189  mg/dL   High  >190     mg/dL   Very High   Troponin I (q 6hr x 3)     Status: Abnormal   Collection Time: 12/18/15  5:29 AM  Result Value Ref Range   Troponin I 0.89 (HH) <0.03 ng/mL    Comment: CRITICAL VALUE NOTED.  VALUE IS CONSISTENT WITH PREVIOUSLY REPORTED AND CALLED VALUE.  Heparin level (unfractionated)     Status: None   Collection Time: 12/18/15  7:45 AM  Result Value Ref Range   Heparin Unfractionated 0.66 0.30 - 0.70 IU/mL    Comment:        IF HEPARIN RESULTS ARE BELOW EXPECTED VALUES, AND PATIENT DOSAGE HAS BEEN CONFIRMED, SUGGEST FOLLOW UP TESTING OF ANTITHROMBIN III LEVELS.   Glucose, capillary     Status: Abnormal   Collection Time: 12/18/15  7:53 AM  Result Value Ref Range   Glucose-Capillary 162 (H) 65 - 99 mg/dL  Troponin I (q 6hr x 3)     Status: Abnormal   Collection Time: 12/18/15 10:47 AM  Result Value Ref Range   Troponin I 0.53 (HH) <0.03 ng/mL    Comment: CRITICAL VALUE NOTED.  VALUE IS CONSISTENT WITH PREVIOUSLY REPORTED AND CALLED VALUE.  Haptoglobin     Status: Abnormal   Collection Time: 12/18/15 10:47 AM  Result Value Ref Range  Haptoglobin 523 (H) 34 - 200 mg/dL    Comment: (NOTE) Performed At: [facility] [place], [place] [id number] Lindon Romp MD [id number]   Comprehensive metabolic panel     Status: Abnormal   Collection Time: 12/18/15 10:47 AM  Result Value Ref Range   Sodium 141 135 - 145 mmol/L   Potassium 3.9 3.5 - 5.1 mmol/L   Chloride 114 (H) 101 - 111 mmol/L   CO2 22 22 - 32 mmol/L   Glucose, Bld  178 (H) 65 - 99 mg/dL   BUN 25 (H) 6 - 20 mg/dL   Creatinine, Ser 2.18 (H) 0.44 - 1.00 mg/dL   Calcium 9.4 8.9 - 10.3 mg/dL   Total Protein 6.3 (L) 6.5 - 8.1 g/dL   Albumin 2.8 (L) 3.5 - 5.0 g/dL   AST 74 (H) 15 - 41 U/L   ALT 59 (H) 14 - 54 U/L   Alkaline Phosphatase 73 38 - 126 U/L   Total Bilirubin 0.5 0.3 - 1.2 mg/dL   GFR calc non Af Amer 22 (L) >60 mL/min   GFR calc Af Amer 25 (L) >60 mL/min    Comment: (NOTE) The eGFR has been calculated using the CKD EPI equation. This calculation has not been validated in all clinical situations. eGFR's persistently <60 mL/min signify possible Chronic Kidney Disease.    Anion gap 5 5 - 15  Rapid urine drug screen (hospital performed)     Status: Abnormal   Collection Time: 12/18/15 11:04 AM  Result Value Ref Range   Opiates NONE DETECTED NONE DETECTED   Cocaine NONE DETECTED NONE DETECTED   Benzodiazepines POSITIVE (A) NONE DETECTED   Amphetamines NONE DETECTED NONE DETECTED   Tetrahydrocannabinol NONE DETECTED NONE DETECTED   Barbiturates NONE DETECTED NONE DETECTED    Comment:        DRUG SCREEN FOR MEDICAL PURPOSES ONLY.  IF CONFIRMATION IS NEEDED FOR ANY PURPOSE, NOTIFY LAB WITHIN 5 DAYS.        LOWEST DETECTABLE LIMITS FOR URINE DRUG SCREEN Drug Class       Cutoff (ng/mL) Amphetamine      1000 Barbiturate      200 Benzodiazepine   496 Tricyclics       759 Opiates          300 Cocaine          300 THC              50   Glucose, capillary     Status: Abnormal   Collection Time: 12/18/15 11:08 AM  Result Value Ref Range   Glucose-Capillary 156 (H) 65 - 99 mg/dL  Heparin induced platelet Ab (HIT antibody)     Status: Abnormal   Collection Time: 12/18/15 12:26 PM  Result Value Ref Range   Heparin Induced Plt Ab 2.638 (H) 0.000 - 0.400 OD    Comment: (NOTE) Performed At: [facility] [address] [place], [place] [id number] Lindon Romp MD [id number]   APTT     Status: Abnormal   Collection Time:  12/18/15 12:26 PM  Result Value Ref Range   aPTT 49 (H) 24 - 36 seconds    Comment:        IF BASELINE aPTT IS ELEVATED, SUGGEST PATIENT RISK ASSESSMENT BE USED TO DETERMINE APPROPRIATE ANTICOAGULANT THERAPY.   Glucose, capillary     Status: Abnormal   Collection Time: 12/18/15  4:53 PM  Result Value Ref Range   Glucose-Capillary 193 (H)  65 - 99 mg/dL  APTT     Status: Abnormal   Collection Time: 12/18/15  5:49 PM  Result Value Ref Range   aPTT 49 (H) 24 - 36 seconds    Comment:        IF BASELINE aPTT IS ELEVATED, SUGGEST PATIENT RISK ASSESSMENT BE USED TO DETERMINE APPROPRIATE ANTICOAGULANT THERAPY.   Glucose, capillary     Status: Abnormal   Collection Time: 12/18/15  8:05 PM  Result Value Ref Range   Glucose-Capillary 170 (H) 65 - 99 mg/dL  APTT     Status: Abnormal   Collection Time: 12/18/15 10:05 PM  Result Value Ref Range   aPTT 53 (H) 24 - 36 seconds    Comment:        IF BASELINE aPTT IS ELEVATED, SUGGEST PATIENT RISK ASSESSMENT BE USED TO DETERMINE APPROPRIATE ANTICOAGULANT THERAPY.   Glucose, capillary     Status: Abnormal   Collection Time: 12/19/15  2:05 AM  Result Value Ref Range   Glucose-Capillary 166 (H) 65 - 99 mg/dL  CBC with Differential/Platelet     Status: Abnormal   Collection Time: 12/19/15  5:13 AM  Result Value Ref Range   WBC 8.0 4.0 - 10.5 K/uL   RBC 2.96 (L) 3.87 - 5.11 MIL/uL   Hemoglobin 8.3 (L) 12.0 - 15.0 g/dL   HCT 25.9 (L) 36.0 - 46.0 %   MCV 87.5 78.0 - 100.0 fL   MCH 28.0 26.0 - 34.0 pg   MCHC 32.0 30.0 - 36.0 g/dL   RDW 13.4 11.5 - 15.5 %   Platelets 47 (L) 150 - 400 K/uL    Comment: CONSISTENT WITH PREVIOUS RESULT   Neutrophils Relative % 60 %   Neutro Abs 4.8 1.7 - 7.7 K/uL   Lymphocytes Relative 29 %   Lymphs Abs 2.3 0.7 - 4.0 K/uL   Monocytes Relative 8 %   Monocytes Absolute 0.7 0.1 - 1.0 K/uL   Eosinophils Relative 3 %   Eosinophils Absolute 0.2 0.0 - 0.7 K/uL   Basophils Relative 0 %   Basophils Absolute 0.0  0.0 - 0.1 K/uL  Glucose, capillary     Status: Abnormal   Collection Time: 12/19/15  7:30 AM  Result Value Ref Range   Glucose-Capillary 159 (H) 65 - 99 mg/dL  Basic metabolic panel     Status: Abnormal   Collection Time: 12/19/15  9:39 AM  Result Value Ref Range   Sodium 139 135 - 145 mmol/L   Potassium 4.0 3.5 - 5.1 mmol/L   Chloride 113 (H) 101 - 111 mmol/L   CO2 20 (L) 22 - 32 mmol/L   Glucose, Bld 252 (H) 65 - 99 mg/dL   BUN 25 (H) 6 - 20 mg/dL   Creatinine, Ser 1.95 (H) 0.44 - 1.00 mg/dL   Calcium 9.1 8.9 - 10.3 mg/dL   GFR calc non Af Amer 25 (L) >60 mL/min   GFR calc Af Amer 29 (L) >60 mL/min    Comment: (NOTE) The eGFR has been calculated using the CKD EPI equation. This calculation has not been validated in all clinical situations. eGFR's persistently <60 mL/min signify possible Chronic Kidney Disease.    Anion gap 6 5 - 15  APTT     Status: Abnormal   Collection Time: 12/19/15  9:39 AM  Result Value Ref Range   aPTT 47 (H) 24 - 36 seconds    Comment:        IF BASELINE aPTT IS ELEVATED,  SUGGEST PATIENT RISK ASSESSMENT BE USED TO DETERMINE APPROPRIATE ANTICOAGULANT THERAPY.   Glucose, capillary     Status: Abnormal   Collection Time: 12/19/15 11:32 AM  Result Value Ref Range   Glucose-Capillary 239 (H) 65 - 99 mg/dL  APTT     Status: Abnormal   Collection Time: 12/19/15  3:45 PM  Result Value Ref Range   aPTT 47 (H) 24 - 36 seconds    Comment:        IF BASELINE aPTT IS ELEVATED, SUGGEST PATIENT RISK ASSESSMENT BE USED TO DETERMINE APPROPRIATE ANTICOAGULANT THERAPY.   Glucose, capillary     Status: Abnormal   Collection Time: 12/19/15  4:47 PM  Result Value Ref Range   Glucose-Capillary 188 (H) 65 - 99 mg/dL  Ammonia     Status: None   Collection Time: 12/19/15  6:33 PM  Result Value Ref Range   Ammonia 16 9 - 35 umol/L  Blood gas, arterial     Status: Abnormal   Collection Time: 12/19/15  6:50 PM  Result Value Ref Range   FIO2 21.00    Delivery  systems ROOM AIR    pH, Arterial 7.396 7.350 - 7.450   pCO2 arterial 35.8 32.0 - 48.0 mmHg   pO2, Arterial 90.3 83.0 - 108.0 mmHg   Bicarbonate 21.5 20.0 - 28.0 mmol/L   Acid-base deficit 2.6 (H) 0.0 - 2.0 mmol/L   O2 Saturation 97.0 %   Patient temperature 98.6    Collection site RIGHT RADIAL    Drawn by [id number]    Sample type ARTERIAL DRAW    Allens test (pass/fail) PASS PASS  TSH     Status: None   Collection Time: 12/19/15  6:55 PM  Result Value Ref Range   TSH 4.317 0.350 - 4.500 uIU/mL    Comment: Performed by a 3rd Generation assay with a functional sensitivity of <=0.01 uIU/mL.  Comprehensive metabolic panel     Status: Abnormal   Collection Time: 12/19/15  6:55 PM  Result Value Ref Range   Sodium 139 135 - 145 mmol/L   Potassium 3.6 3.5 - 5.1 mmol/L   Chloride 111 101 - 111 mmol/L   CO2 21 (L) 22 - 32 mmol/L   Glucose, Bld 208 (H) 65 - 99 mg/dL   BUN 24 (H) 6 - 20 mg/dL   Creatinine, Ser 1.90 (H) 0.44 - 1.00 mg/dL   Calcium 8.9 8.9 - 10.3 mg/dL   Total Protein 6.1 (L) 6.5 - 8.1 g/dL   Albumin 2.6 (L) 3.5 - 5.0 g/dL   AST 89 (H) 15 - 41 U/L   ALT 55 (H) 14 - 54 U/L   Alkaline Phosphatase 62 38 - 126 U/L   Total Bilirubin 0.5 0.3 - 1.2 mg/dL   GFR calc non Af Amer 26 (L) >60 mL/min   GFR calc Af Amer 30 (L) >60 mL/min    Comment: (NOTE) The eGFR has been calculated using the CKD EPI equation. This calculation has not been validated in all clinical situations. eGFR's persistently <60 mL/min signify possible Chronic Kidney Disease.    Anion gap 7 5 - 15  CBC with Differential/Platelet     Status: Abnormal   Collection Time: 12/19/15  6:55 PM  Result Value Ref Range   WBC 9.3 4.0 - 10.5 K/uL   RBC 2.85 (L) 3.87 - 5.11 MIL/uL   Hemoglobin 7.9 (L) 12.0 - 15.0 g/dL   HCT 24.8 (L) 36.0 - 46.0 %   MCV 87.0 78.0 -  100.0 fL   MCH 27.7 26.0 - 34.0 pg   MCHC 31.9 30.0 - 36.0 g/dL   RDW 13.1 11.5 - 15.5 %   Platelets 54 (L) 150 - 400 K/uL    Comment: CONSISTENT WITH  PREVIOUS RESULT   Neutrophils Relative % 64 %   Neutro Abs 5.9 1.7 - 7.7 K/uL   Lymphocytes Relative 28 %   Lymphs Abs 2.6 0.7 - 4.0 K/uL   Monocytes Relative 7 %   Monocytes Absolute 0.6 0.1 - 1.0 K/uL   Eosinophils Relative 2 %   Eosinophils Absolute 0.2 0.0 - 0.7 K/uL   Basophils Relative 0 %   Basophils Absolute 0.0 0.0 - 0.1 K/uL  Magnesium     Status: None   Collection Time: 12/19/15  6:55 PM  Result Value Ref Range   Magnesium 1.8 1.7 - 2.4 mg/dL  Troponin I (q 6hr x 3)     Status: Abnormal   Collection Time: 12/19/15  6:55 PM  Result Value Ref Range   Troponin I 0.22 (HH) <0.03 ng/mL    Comment: CRITICAL VALUE NOTED.  VALUE IS CONSISTENT WITH PREVIOUSLY REPORTED AND CALLED VALUE.  Glucose, capillary     Status: Abnormal   Collection Time: 12/19/15  8:52 PM  Result Value Ref Range   Glucose-Capillary 178 (H) 65 - 99 mg/dL   Ir Fluoro Guide Cv Line Right  Result Date: 12/19/2015 CLINICAL DATA:  Poor venous access.  Chronic kidney disease. EXAM: RIGHT IJ CENTRAL VENOUS CATHETER PLACEMENT UNDER ULTRASOUND AND FLUOROSCOPIC GUIDANCE FLUOROSCOPY TIME:  Less than 6 seconds TECHNIQUE: The procedure, risks (including but not limited to bleeding, infection, organ damage ), benefits, and alternatives were explained to the patient. Questions regarding the procedure were encouraged and answered. The patient understands and consents to the procedure. Patency of the right IJ vein was confirmed with ultrasound with image documentation. An appropriate skin site was determined. Skin site was marked. Region was prepped using maximum barrier technique including cap and mask, sterile gown, sterile gloves, large sterile sheet, and Chlorhexidine as cutaneous antisepsis. The region was infiltrated locally with 1% lidocaine. Under real-time ultrasound guidance, the right IJ vein was accessed with a 21 gauge micropuncture needle; the needle tip within the vein was confirmed with ultrasound image  documentation. The needle exchanged over a guidewire for peel-away sheath through which an 18cm 5 [place] PowerPICC dual lumen catheter was advanced. This was positioned with the tip at the cavoatrial junction. Spot chest radiograph shows good positioning and no pneumothorax. Catheter was flushed and sutured externally with 0-Prolene sutures. Patient tolerated the procedure well. COMPLICATIONS: None immediate IMPRESSION: 1. Technically successful right IJ double lumen power injectable central venous catheter placement. Electronically Signed   By: Lucrezia Europe M.D.   On: 12/19/2015 14:05   Ir US Guide Vasc Access Right  Result Date: 12/19/2015 CLINICAL DATA:  Poor venous access.  Chronic kidney disease. EXAM: RIGHT IJ CENTRAL VENOUS CATHETER PLACEMENT UNDER ULTRASOUND AND FLUOROSCOPIC GUIDANCE FLUOROSCOPY TIME:  Less than 6 seconds TECHNIQUE: The procedure, risks (including but not limited to bleeding, infection, organ damage ), benefits, and alternatives were explained to the patient. Questions regarding the procedure were encouraged and answered. The patient understands and consents to the procedure. Patency of the right IJ vein was confirmed with ultrasound with image documentation. An appropriate skin site was determined. Skin site was marked. Region was prepped using maximum barrier technique including cap and mask, sterile gown, sterile gloves, large sterile sheet, and Chlorhexidine  as cutaneous antisepsis. The region was infiltrated locally with 1% lidocaine. Under real-time ultrasound guidance, the right IJ vein was accessed with a 21 gauge micropuncture needle; the needle tip within the vein was confirmed with ultrasound image documentation. The needle exchanged over a guidewire for peel-away sheath through which an 18cm 5 [place] PowerPICC dual lumen catheter was advanced. This was positioned with the tip at the cavoatrial junction. Spot chest radiograph shows good positioning and no pneumothorax. Catheter  was flushed and sutured externally with 0-Prolene sutures. Patient tolerated the procedure well. COMPLICATIONS: None immediate IMPRESSION: 1. Technically successful right IJ double lumen power injectable central venous catheter placement. Electronically Signed   By: Lucrezia Europe M.D.   On: 12/19/2015 14:05    ROS: all other systems negative except as per HPI  Blood pressure (!) 155/72, pulse 82, temperature 99 F (37.2 C), temperature source Oral, resp. rate 13, height '5\' 3"'$  (1.6 m), weight 101.3 kg (223 lb 4.8 oz), SpO2 99 %. Physical Exam  GEN: NAD, mildly confused HEENT: EOMI, PERRL, dry MM NECK: Supple, no JVD PULM: CTAB no c/w/r CV RRR no m/r/g ABD: soft, nontender, nondistended, NABS EXT: no LE edema NEURO: nonfocal but confused  Assessment/Plan  1.  Resolving AKI: required one session of dialysis 11/3 for hyperkalemia.  Creatinine near baseline, apparently was 1.0 before AKI.  Pt seems slightly dry on exam.  Would continue IVF at 75/ hr for now; will be important to continue them for at least 6 hours before and 12 hours after cath.  2.  Nonsustained Vtach/ NSTEMI: Mg level pending, 2 g IV mag given.  Cardiology to eval.  If needs cath sooner rather than later, just continue fluids and attempt to minimize contrast for cath.  Ideally would like to have cr as close to baseline as possible but we may not have that much time to work with.  3.  Fever: combined with confusion, wonder about occult sepsis.  Urine culture and blood cultures x 2.  4.   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  On argatroban gtt.  5.  Anemia: haptoglobin not reflective of hemolysis.  No schistos on smear.  Investigating iron studies.  Madelon Lips [facility] Cell [phone] pgr [phone] 12/19/2015, 9:51 PM

## 2015-12-20 DIAGNOSIS — D7582 Heparin induced thrombocytopenia (HIT): Secondary | ICD-10-CM

## 2015-12-20 DIAGNOSIS — D75829 Heparin-induced thrombocytopenia, unspecified: Secondary | ICD-10-CM

## 2015-12-20 DIAGNOSIS — R5383 Other fatigue: Secondary | ICD-10-CM

## 2015-12-20 DIAGNOSIS — R0789 Other chest pain: Secondary | ICD-10-CM

## 2015-12-20 LAB — RENAL FUNCTION PANEL
ANION GAP: 7 (ref 5–15)
Albumin: 2.4 g/dL — ABNORMAL LOW (ref 3.5–5.0)
BUN: 24 mg/dL — ABNORMAL HIGH (ref 6–20)
CALCIUM: 8.6 mg/dL — AB (ref 8.9–10.3)
CO2: 21 mmol/L — AB (ref 22–32)
Chloride: 111 mmol/L (ref 101–111)
Creatinine, Ser: 1.76 mg/dL — ABNORMAL HIGH (ref 0.44–1.00)
GFR calc Af Amer: 33 mL/min — ABNORMAL LOW (ref >60)
GFR calc non Af Amer: 28 mL/min — ABNORMAL LOW (ref >60)
GLUCOSE: 229 mg/dL — AB (ref 65–99)
POTASSIUM: 3.4 mmol/L — AB (ref 3.5–5.1)
Phosphorus: 2.9 mg/dL (ref 2.5–4.6)
SODIUM: 139 mmol/L (ref 135–145)

## 2015-12-20 LAB — GLUCOSE, CAPILLARY
GLUCOSE-CAPILLARY: 208 mg/dL — AB (ref 65–99)
GLUCOSE-CAPILLARY: 224 mg/dL — AB (ref 65–99)
Glucose-Capillary: 367 mg/dL — ABNORMAL HIGH (ref 65–99)
Glucose-Capillary: 280 mg/dL — ABNORMAL HIGH (ref 65–99)

## 2015-12-20 LAB — CBC
HCT: 24.2 % — ABNORMAL LOW (ref 36.0–46.0)
Hemoglobin: 7.7 g/dL — ABNORMAL LOW (ref 12.0–15.0)
MCH: 27.5 pg (ref 26.0–34.0)
MCHC: 31.8 g/dL (ref 30.0–36.0)
MCV: 86.4 fL (ref 78.0–100.0)
PLATELETS: 55 10*3/uL — AB (ref 150–400)
RBC: 2.8 MIL/uL — AB (ref 3.87–5.11)
RDW: 13.1 % (ref 11.5–15.5)
WBC: 7.2 10*3/uL (ref 4.0–10.5)

## 2015-12-20 LAB — APTT
aPTT: 67 seconds — ABNORMAL HIGH (ref 24–36)
aPTT: 68 seconds — ABNORMAL HIGH (ref 24–36)

## 2015-12-20 LAB — TROPONIN I
TROPONIN I: 0.15 ng/mL — AB (ref <0.03)
Troponin I: 0.15 ng/mL (ref <0.03)

## 2015-12-20 LAB — LACTIC ACID, PLASMA: Lactic Acid, Venous: 1.2 mmol/L (ref 0.5–1.9)

## 2015-12-20 LAB — MAGNESIUM: MAGNESIUM: 2.2 mg/dL (ref 1.7–2.4)

## 2015-12-20 MED ORDER — POTASSIUM CHLORIDE CRYS ER 20 MEQ PO TBCR
40.0000 meq | EXTENDED_RELEASE_TABLET | Freq: Once | ORAL | Status: AC
  Administered 2015-12-20: 40 meq via ORAL
  Filled 2015-12-20: qty 2

## 2015-12-20 MED ORDER — SODIUM CHLORIDE 0.9 % IV SOLN
30.0000 meq | Freq: Once | INTRAVENOUS | Status: AC
  Administered 2015-12-20: 30 meq via INTRAVENOUS
  Filled 2015-12-20: qty 15

## 2015-12-20 MED ORDER — FERROUS FUMARATE 324 (106 FE) MG PO TABS
1.0000 | ORAL_TABLET | Freq: Two times a day (BID) | ORAL | Status: DC
  Administered 2015-12-20 – 2015-12-21 (×3): 106 mg via ORAL
  Filled 2015-12-20 (×3): qty 1

## 2015-12-20 MED ORDER — INSULIN GLARGINE 100 UNIT/ML ~~LOC~~ SOLN
14.0000 [IU] | Freq: Every day | SUBCUTANEOUS | Status: DC
  Administered 2015-12-20 – 2015-12-27 (×8): 14 [IU] via SUBCUTANEOUS
  Filled 2015-12-20 (×9): qty 0.14

## 2015-12-20 NOTE — Progress Notes (Signed)
Pt seen at 1945 after call at shift change that she had several episode of Vtach. Upon my arrival Pt resting in bed. Alert oriented denies pain SOB or CP. Per floor RN Cardiologist has already seen Pt and reviewed strips. PCCM MD Dr. Zonia KiefStephens at bedside to assess. Pt left resting  in bed,  family at bedside. Transfer pending PCCM assessment.

## 2015-12-20 NOTE — Progress Notes (Signed)
[facility] Progress Note    Assessment/ Plan:   1.  Resolving AKI: required one session of dialysis 11/3 for hyperkalemia.  Creatinine near baseline, apparently was 1.0 before AKI.  Pt seems slightly dry on exam.  Would continue IVF at 75/ hr for now; will be important to continue them for at least 6 hours before and 12 hours after cath.  2.  Nonsustained Vtach/ NSTEMI: Mg level pending, 2 g IV mag given.  moved to SDU for closer monitoring If needs cath sooner rather than later, just continue fluids and attempt to minimize contrast for cath.  Ideally would like to have cr as close to baseline as possible but we may not have that much time to work with.  3.  Fever: combined with confusion, wonder about occult sepsis.  Urine culture and blood cultures x 2.  No real source though.    4.   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  On argatroban gtt.  5.  Anemia: haptoglobin not reflective of hemolysis.  No schistos on smear.  Investigating iron studies - % sat 16, will attempt oral iron before IV.  Subjective:    Moved to SD overnight after confusion and nonsustained Vtach.  Mag repleted.   Objective:   BP (!) 112/94 (BP Location: Left Arm)   Pulse 84   Temp 98.8 F (37.1 C) (Oral)   Resp 19   Ht 5\' 3"  (1.6 m)   Wt 101.3 kg (223 lb 5.2 oz)   SpO2 100%   BMI 39.56 kg/m   Intake/Output Summary (Last 24 hours) at 12/20/15 0940 Last data filed at 12/20/15 0900  Gross per 24 hour  Intake          2644.46 ml  Output             3400 ml  Net          -755.54 ml   Weight change: 0.012 kg (0.4 oz)  Physical Exam: GEN: NAD, sitting in chair, not confused today HEENT: EOMI, PERRL, dry MM NECK: Supple, no JVD PULM: CTAB no c/w/r CV RRR no m/r/g ABD: soft, nontender, nondistended, NABS EXT: no LE edema NEURO: nonfocal   Imaging: Ct Head Wo Contrast  Result Date: 12/19/2015 CLINICAL DATA:  Lethargy EXAM: CT HEAD WITHOUT CONTRAST TECHNIQUE: Contiguous axial images  were obtained from the base of the skull through the vertex without intravenous contrast. COMPARISON:  Head CT 07/26/2014 FINDINGS: Brain: No mass lesion, intraparenchymal hemorrhage or extra-axial collection. No evidence of acute cortical infarct. Brain parenchyma and CSF-containing spaces are normal for age. Partially empty sella is again noted. Vascular: No hyperdense vessel or unexpected calcification. Skull: Normal visualized skull base, calvarium and extracranial soft tissues. Sinuses/Orbits: No sinus fluid levels or advanced mucosal thickening. No mastoid effusion. Normal orbits. IMPRESSION: Normal head CT for age. Electronically Signed   By: Deatra Robinson M.D.   On: 12/19/2015 22:50   Ir Fluoro Guide Cv Line Right  Result Date: 12/19/2015 CLINICAL DATA:  Poor venous access.  Chronic kidney disease. EXAM: RIGHT IJ CENTRAL VENOUS CATHETER PLACEMENT UNDER ULTRASOUND AND FLUOROSCOPIC GUIDANCE FLUOROSCOPY TIME:  Less than 6 seconds TECHNIQUE: The procedure, risks (including but not limited to bleeding, infection, organ damage ), benefits, and alternatives were explained to the patient. Questions regarding the procedure were encouraged and answered. The patient understands and consents to the procedure. Patency of the right IJ vein was confirmed with ultrasound with image documentation. An appropriate skin site was determined. Skin site  was marked. Region was prepped using maximum barrier technique including cap and mask, sterile gown, sterile gloves, large sterile sheet, and Chlorhexidine as cutaneous antisepsis. The region was infiltrated locally with 1% lidocaine. Under real-time ultrasound guidance, the right IJ vein was accessed with a 21 gauge micropuncture needle; the needle tip within the vein was confirmed with ultrasound image documentation. The needle exchanged over a guidewire for peel-away sheath through which an 18cm 5 Jamaica[place] PowerPICC dual lumen catheter was advanced. This was positioned with  the tip at the cavoatrial junction. Spot chest radiograph shows good positioning and no pneumothorax. Catheter was flushed and sutured externally with 0-Prolene sutures. Patient tolerated the procedure well. COMPLICATIONS: None immediate IMPRESSION: 1. Technically successful right IJ double lumen power injectable central venous catheter placement. Electronically Signed   By: Corlis Leak  Hassell M.D.   On: 12/19/2015 14:05   Ir [place] Guide Vasc Access Right  Result Date: 12/19/2015 CLINICAL DATA:  Poor venous access.  Chronic kidney disease. EXAM: RIGHT IJ CENTRAL VENOUS CATHETER PLACEMENT UNDER ULTRASOUND AND FLUOROSCOPIC GUIDANCE FLUOROSCOPY TIME:  Less than 6 seconds TECHNIQUE: The procedure, risks (including but not limited to bleeding, infection, organ damage ), benefits, and alternatives were explained to the patient. Questions regarding the procedure were encouraged and answered. The patient understands and consents to the procedure. Patency of the right IJ vein was confirmed with ultrasound with image documentation. An appropriate skin site was determined. Skin site was marked. Region was prepped using maximum barrier technique including cap and mask, sterile gown, sterile gloves, large sterile sheet, and Chlorhexidine as cutaneous antisepsis. The region was infiltrated locally with 1% lidocaine. Under real-time ultrasound guidance, the right IJ vein was accessed with a 21 gauge micropuncture needle; the needle tip within the vein was confirmed with ultrasound image documentation. The needle exchanged over a guidewire for peel-away sheath through which an 18cm 5 [place] PowerPICC dual lumen catheter was advanced. This was positioned with the tip at the cavoatrial junction. Spot chest radiograph shows good positioning and no pneumothorax. Catheter was flushed and sutured externally with 0-Prolene sutures. Patient tolerated the procedure well. COMPLICATIONS: None immediate IMPRESSION: 1. Technically successful right IJ  double lumen power injectable central venous catheter placement. Electronically Signed   By: Corlis Leak  Hassell M.D.   On: 12/19/2015 14:05    Labs: BMET  Recent Labs Lab 12/17/15 1624 12/18/15 1047 12/19/15 0939 12/19/15 1855 12/20/15 0314  NA 136 141 139 139 139  K 4.0 3.9 4.0 3.6 3.4*  CL 109 114* 113* 111 111  CO2 19* 22 20* 21* 21*  GLUCOSE 320* 178* 252* 208* 229*  BUN 30* 25* 25* 24* 24*  CREATININE 2.63* 2.18* 1.95* 1.90* 1.76*  CALCIUM 9.4 9.4 9.1 8.9 8.6*  PHOS  --   --   --   --  2.9   CBC  Recent Labs Lab 12/18/15 0529 12/19/15 0513 12/19/15 1855 12/20/15 0513  WBC 10.2 8.0 9.3 7.2  NEUTROABS  --  4.8 5.9  --   HGB 8.6* 8.3* 7.9* 7.7*  HCT 27.0* 25.9* 24.8* 24.2*  MCV 86.8 87.5 87.0 86.4  PLT 44* 47* 54* 55*    Medications:    . amLODipine  10 mg Oral Daily  . aspirin  81 mg Oral Daily  . doxazosin  4 mg Oral QHS  . ezetimibe  10 mg Oral QHS  . feeding supplement (PRO-STAT SUGAR FREE 64)  30 mL Oral BID  . gabapentin  400 mg Oral TID  .  hydrALAZINE  50 mg Oral TID  . insulin aspart  0-5 Units Subcutaneous QHS  . insulin aspart  0-9 Units Subcutaneous TID WC  . insulin glargine  10 Units Subcutaneous Q2200  . mometasone-formoterol  2 puff Inhalation BID  . pantoprazole  40 mg Oral Daily  . rosuvastatin  40 mg Oral QHS      Bufford ButtnerElizabeth Altamese Deguire MD [facility] Cell [phone] pgr [phone] 12/20/2015, 9:40 AM

## 2015-12-20 NOTE — Progress Notes (Signed)
Patient Name: Heidi Campbell Date of Encounter: 12/20/2015  Primary Cardiologist: Dr. Alanda SlimVaranasi  Hospital Problem List     Principal Problem:   NSTEMI (non-ST elevated myocardial infarction) [facility](HCC) Active Problems:   Type 2 diabetes mellitus treated with insulin (HCC)   Esophageal reflux   Hypertension   Coronary atherosclerosis of native coronary artery   Hyperlipidemia   CKD (chronic kidney disease), stage IV (HCC)   Atypical chest pain   Normocytic anemia   Thrombocytopenia (HCC)   Elevated troponin   Lethargy   V-tach (HCC)   HIT (heparin-induced thrombocytopenia) (HCC)     Subjective  Question of NSVT overnight- strips reviewed by Dr. Delton SeeNelson, Dr. Ladona Ridgelaylor and myself - I agree that this was likely lead artifact. HIT positive. Platelets slowly improving on argatroban gtts. I do not see urgent indication for cath at this point. I agree with Dr. Signe ColtUpton that I would like to see renal function return to baseline if possible.  Inpatient Medications    Scheduled Meds: . amLODipine  10 mg Oral Daily  . aspirin  81 mg Oral Daily  . doxazosin  4 mg Oral QHS  . ezetimibe  10 mg Oral QHS  . feeding supplement (PRO-STAT SUGAR FREE 64)  30 mL Oral BID  . Ferrous Fumarate  1 tablet Oral BID  . gabapentin  400 mg Oral TID  . hydrALAZINE  50 mg Oral TID  . insulin aspart  0-5 Units Subcutaneous QHS  . insulin aspart  0-9 Units Subcutaneous TID WC  . insulin glargine  14 Units Subcutaneous Q2200  . mometasone-formoterol  2 puff Inhalation BID  . pantoprazole  40 mg Oral Daily  . rosuvastatin  40 mg Oral QHS   Continuous Infusions: . sodium chloride 75 mL/hr at 12/20/15 0600  . argatroban 2.04 mcg/kg/min (12/20/15 1200)   PRN Meds: acetaminophen, ALPRAZolam, bisacodyl, fluticasone, lidocaine, morphine injection, nitroGLYCERIN, ondansetron (ZOFRAN) IV, ondansetron, polyethylene glycol, technetium TC 67M diethylenetriame-pentaacetic acid, zolpidem   Vital Signs    Vitals:   12/20/15 1000 12/20/15 1100 12/20/15 1114 12/20/15 1200  BP: (!) 149/72 (!) 139/112  (!) 134/105  Pulse: 87 80  81  Resp: 14 11  12   Temp:   97.9 F (36.6 C)   TempSrc:   Oral   SpO2: 100% 99%  100%  Weight:      Height:        Intake/Output Summary (Last 24 hours) at 12/20/15 1310 Last data filed at 12/20/15 1200  Gross per 24 hour  Intake          2906.06 ml  Output             3000 ml  Net           -93.94 ml   Filed Weights   12/18/15 0147 12/19/15 0352 12/19/15 2250  Weight: 219 lb (99.3 kg) 223 lb 4.8 oz (101.3 kg) 223 lb 5.2 oz (101.3 kg)    Physical Exam    General: resting in bed, no acute distress Cardiac: RRR, systolic murmur heard at upper sternal borders, DP pulses +2 b/l Pulm: clear to auscultation bilaterally, moving normal volumes of air Abd: soft, nondistended Ext: warm and well perfused, no pedal edema Neuro: alert and oriented X3   Labs    CBC  Recent Labs  12/19/15 0513 12/19/15 1855 12/20/15 0513  WBC 8.0 9.3 7.2  NEUTROABS 4.8 5.9  --   HGB 8.3* 7.9* 7.7*  HCT 25.9* 24.8* 24.2*  MCV 87.5 87.0 86.4  PLT 47* 54* 55*   Basic Metabolic Panel  Recent Labs  12/19/15 1855 12/20/15 0314 12/20/15 0801  NA 139 139  --   K 3.6 3.4*  --   CL 111 111  --   CO2 21* 21*  --   GLUCOSE 208* 229*  --   BUN 24* 24*  --   CREATININE 1.90* 1.76*  --   CALCIUM 8.9 8.6*  --   MG 1.8  --  2.2  PHOS  --  2.9  --    Liver Function Tests  Recent Labs  12/18/15 1047 12/19/15 1855 12/20/15 0314  AST 74* 89*  --   ALT 59* 55*  --   ALKPHOS 73 62  --   BILITOT 0.5 0.5  --   PROT 6.3* 6.1*  --   ALBUMIN 2.8* 2.6* 2.4*   No results for input(s): LIPASE, AMYLASE in the last 72 hours. Cardiac Enzymes  Recent Labs  12/19/15 1855 12/20/15 0314 12/20/15 0800  TROPONINI 0.22* 0.15* 0.15*   BNP Invalid input(s): POCBNP D-Dimer No results for input(s): DDIMER in the last 72 hours. Hemoglobin A1C  Recent Labs  12/18/15 0529  HGBA1C 9.8*    Fasting Lipid Panel  Recent Labs  12/18/15 0529  CHOL 81  HDL 29*  LDLCALC 34  TRIG 89  CHOLHDL 2.8   Thyroid Function Tests  Recent Labs  12/19/15 1855  TSH 4.317    Telemetry    NSR, low voltage, few PVCs, short run sinus tach - Personally Reviewed  ECG    11/16: NSR, low voltage III and aVF, normal R-wave progression - Personally Reviewed  Radiology    Ct Head Wo Contrast  Result Date: 12/19/2015 CLINICAL DATA:  Lethargy EXAM: CT HEAD WITHOUT CONTRAST TECHNIQUE: Contiguous axial images were obtained from the base of the skull through the vertex without intravenous contrast. COMPARISON:  Head CT 07/26/2014 FINDINGS: Brain: No mass lesion, intraparenchymal hemorrhage or extra-axial collection. No evidence of acute cortical infarct. Brain parenchyma and CSF-containing spaces are normal for age. Partially empty sella is again noted. Vascular: No hyperdense vessel or unexpected calcification. Skull: Normal visualized skull base, calvarium and extracranial soft tissues. Sinuses/Orbits: No sinus fluid levels or advanced mucosal thickening. No mastoid effusion. Normal orbits. IMPRESSION: Normal head CT for age. Electronically Signed   By: Deatra Robinson M.D.   On: 12/19/2015 22:50   Ir Fluoro Guide Cv Line Right  Result Date: 12/19/2015 CLINICAL DATA:  Poor venous access.  Chronic kidney disease. EXAM: RIGHT IJ CENTRAL VENOUS CATHETER PLACEMENT UNDER ULTRASOUND AND FLUOROSCOPIC GUIDANCE FLUOROSCOPY TIME:  Less than 6 seconds TECHNIQUE: The procedure, risks (including but not limited to bleeding, infection, organ damage ), benefits, and alternatives were explained to the patient. Questions regarding the procedure were encouraged and answered. The patient understands and consents to the procedure. Patency of the right IJ vein was confirmed with ultrasound with image documentation. An appropriate skin site was determined. Skin site was marked. Region was prepped using maximum barrier  technique including cap and mask, sterile gown, sterile gloves, large sterile sheet, and Chlorhexidine as cutaneous antisepsis. The region was infiltrated locally with 1% lidocaine. Under real-time ultrasound guidance, the right IJ vein was accessed with a 21 gauge micropuncture needle; the needle tip within the vein was confirmed with ultrasound image documentation. The needle exchanged over a guidewire for peel-away sheath through which an 18cm 5 [place] PowerPICC dual lumen catheter was advanced. This was positioned  with the tip at the cavoatrial junction. Spot chest radiograph shows good positioning and no pneumothorax. Catheter was flushed and sutured externally with 0-Prolene sutures. Patient tolerated the procedure well. COMPLICATIONS: None immediate IMPRESSION: 1. Technically successful right IJ double lumen power injectable central venous catheter placement. Electronically Signed   By: Corlis Leak M.D.   On: 12/19/2015 14:05   Ir US Guide Vasc Access Right  Result Date: 12/19/2015 CLINICAL DATA:  Poor venous access.  Chronic kidney disease. EXAM: RIGHT IJ CENTRAL VENOUS CATHETER PLACEMENT UNDER ULTRASOUND AND FLUOROSCOPIC GUIDANCE FLUOROSCOPY TIME:  Less than 6 seconds TECHNIQUE: The procedure, risks (including but not limited to bleeding, infection, organ damage ), benefits, and alternatives were explained to the patient. Questions regarding the procedure were encouraged and answered. The patient understands and consents to the procedure. Patency of the right IJ vein was confirmed with ultrasound with image documentation. An appropriate skin site was determined. Skin site was marked. Region was prepped using maximum barrier technique including cap and mask, sterile gown, sterile gloves, large sterile sheet, and Chlorhexidine as cutaneous antisepsis. The region was infiltrated locally with 1% lidocaine. Under real-time ultrasound guidance, the right IJ vein was accessed with a 21 gauge micropuncture  needle; the needle tip within the vein was confirmed with ultrasound image documentation. The needle exchanged over a guidewire for peel-away sheath through which an 18cm 5 [place] PowerPICC dual lumen catheter was advanced. This was positioned with the tip at the cavoatrial junction. Spot chest radiograph shows good positioning and no pneumothorax. Catheter was flushed and sutured externally with 0-Prolene sutures. Patient tolerated the procedure well. COMPLICATIONS: None immediate IMPRESSION: 1. Technically successful right IJ double lumen power injectable central venous catheter placement. Electronically Signed   By: Corlis Leak M.D.   On: 12/19/2015 14:05    Cardiac Studies   TTE 12/05/2015: - Left ventricle: Wall thickness was increased in a pattern of mild LVH. Systolic function was normal. The estimated ejection fraction was in the range of 60% to 65%. - Pulmonary arteries: PA peak pressure: 44 mm Hg (S).  Patient Profile     70 year old female with a history of CAD s/p LHC 2012 (50% proximal LAD plaque), HTN, IDDM, Asthma, GERD, L3-L4 spondylolisthesis s/p decompressive laminectomy/foraminotomies (11/07/2015), and recent ARF who presented to the ED with chest pain, SOB, and fatigue found to have NSTEMI.  Assessment & Plan    NSTEMI: Troponin peaked to 1.24 before trending down. EKG without acute ischemic changes.  Cath may not be feasible at this time given recent renal failure and thrombocytopenia. Off heparin with low platelets and confirmed HIT, now on Argatroban with pharmacy's assistance. -Continue Argatroban per pharmacy for HIT -Continue prn nitro -Continue ASA 81 mg daily -Monitor for renal recovery and improved platelet count  CAD: Known CAD s/p LHC in 09/2010 which was notable for moderate proximal LAD disease with 50% plaque and moderate diffuse disease in the remainder of the vessel. She was medically managed without percutaneous intervention. -Continue Aspirin 81 mg  daily -Continue Crestor 40 mg qhs and Ezetimibe 10 mg qhs -Not on Beta Blocker, previously was taking but d/c'ed for low HR  ?Paroxysmal Atrial Fibrillation: CHADSVASc score is 5, 6.7% yearly stroke risk. Unclear if she was truly in atrial fibrillation that self-converted. Suspect this was ischemic related arrhythmia. We will hold off on long-term anticoagulation at this time. -Continue telemetry monitoring  Thrombocytopenia: HIT positive -Continue Argatroban per pharmacy, platelets improving -monitor for signs/symptoms of bleeding  Renal Disease: Acute Renal Failure earlier this month s/p emergent HD on 11/3 for hyperkalemia thought secondary to ARB use and hypotension/volume depletion.  -Avoid nephrotoxic agents -appreciate renal recs, hydrating today  HTN: BP stable this morning, hypotensive overnight. -On amlodipine 10 mg daily, hydralazine 50 mg TID, dozaxosin 4 mg qhs  S/p L3/L4 decompressive laminectomy and foraminotomy: Patient with good strength in LE. WOC consulted for surgical site care.  T2DM: Hgb A1c 9.8 -SSI and Lantus per primary  Chrystie NoseKenneth C. Hilty, MD, [facility] Attending Cardiologist [facility]  Chrystie NoseKenneth C Hilty, MD  12/20/2015, 1:10 PM

## 2015-12-20 NOTE — Progress Notes (Signed)
PROGRESS NOTE    Heidi Campbell  ZOX:[id number] DOB: Apr 29, 1945 DOA: 12/17/2015 PCP: Astrid Divine, MD   Brief Narrative:  Heidi Campbell is a 70 y.o. female with medical history significant of hypertension, hyperlipidemia, diabetes mellitus, asthma, GERD, CAD, CKD-IV, arthritis, anemia, who presents with chest pain.  Patient states that her chest pain started this morning. It is located in the substernal area, constant, 7 out of 10 in severity, pressure-like pain. Nonradiating. It is not aggravated or alleviated by any known factors. It is associated with mild shortness of breath. No cough, fever or chills. She has left anterior leg pain, but no tenderness over calf areas. Patient was given aspirin and nitroglycerin. Her chest pain has subsided. Currently no chest pain and shortness of breath. Patient denies nausea, vomiting, abdominal, symptoms for UTI, diarrhea, unilateral weakness. Of note, pt was noted to have A fib with RVR by EMS, but no EKG recorded. Her EKG did not show A fib in ED.   Assessment & Plan:   Principal Problem:   NSTEMI (non-ST elevated myocardial infarction) (HCC) Active Problems:   Type 2 diabetes mellitus treated with insulin (HCC)   Esophageal reflux   Hypertension   Coronary atherosclerosis of native coronary artery   Hyperlipidemia   CKD (chronic kidney disease), stage IV (HCC)   Atypical chest pain   Normocytic anemia   Thrombocytopenia (HCC)   Elevated troponin   Lethargy   V-tach (HCC)  #1 non-ST elevated MI Patient had presented with chest pain relieved with nitroglycerin. Troponin is trending down. Patient currently chest pain-free. Patient recently hospitalized for acute renal failure with critical hyperkalemia and creatinine up to 10 requiring emergent hemodialysis. Patient's renal function improving however has not normalized.. Patient also noted to have a thrombocytopenia with a platelet count of 51 which has worsened and now at 44. Due  to thrombocytopenia and current creatinine which has not yet normalized cardiology hesitant to perform cardiac catheterization on the patient. IV heparin has been changed to argatroban per cardiology. Continue low-dose aspirin at 81 mg daily, zetia, hydralazine, Crestor. Patient would recent 2-D echo done on 12/05/2015 with a EF of 60-65% with mild LVH. Repeat 2-D echo pending. Cardiology planning on probable cardiac Right position for further evaluation next week and requesting nephrology be consulted for optimizing of renal function. Nephrology has been consulted and recommended continue IV fluids until renal function is close to baseline or if catheterization needed to be done earlier to continue with IV fluids and minimize contrast. Cardiology following and appreciate input and recommendations.  #2 nonsustained V. tach Patient noted to have several beats of nonsustained V. tach yesterday evening with some associated lethargy. Patient was given magnesium 2 g IV 1 and moved to the ICU for closer monitoring. Cardiac enzymes which was cycled were elevated. Patient with no further arrhythmias noted in the ICU. Repeat magnesium this morning is 2.2. Potassium is at 3.4 and will replete. Cardiology reviewed strips and feel likely more secondary to artifact. Cardiology following and appreciate input and recommendations.  #3 type 2 diabetes mellitus CBGs have ranged from 178-367. Hemoglobin A1c was 11.2 on 10/30/2015. Hemoglobin A1c 9.8. Increase Lantus 14 units daily. Continue sliding scale insulin.  #4 recent acute renal failure Patient's last hospitalization from 12/05/2015-12/12/2015 was noted to be in acute renal failure with a creatinine as high as 10. Patient had to undergo emergent hemodialysis also secondary to severe hyperkalemia. Renal function has been gradually improving however has not normalized yet. Creatinine currently  at 1.76 from 1.95 from 2.18. No nephrotoxins. Gentle hydration. Patient  presented with a non-STEMI patient likely needing cardiac catheterization to be done early next week per cardiology was requesting nephrology consultation to optimize patient's renal function. Patient has been seen in consultation by nephrology will recommend to continue ongoing hydration with IV fluids at 75 mL per hour. The nephrology if emergent cardiac catherization is needed, continue IV fluids and minimize contrast for cath. Appreciate nephrology input and recommendations.  #5 gastroesophageal reflux disease PPI.  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 During last hospitalization patient's platelet count was normal. On admission platelet count of 51. Platelet count today at 55 from 47 from 44. IV heparin has been changed to argatroban.LDH slightly elevated at 361. Haptoglobin elevated at 523. Bilirubin was normal. No schistocytes on smears.  #7 hypertension Continue Cardura, Norvasc, hydralazine.  #8 hyperlipidemia Continue Zetia and Crestor.  #9 normocytic anemia Follow H&H.   DVT prophylaxis: argatroban Code Status: Full Family Communication: Updated patient and son at bedside. Disposition Plan: Pending cardiology evaluation and when thrombocytopenia has improved.   Consultants:   Cardiology: Dr. Rennis GoldenHilty 12/18/2015  Nephrology: Dr. Signe ColtUpton 12/19/2015  PCCM: Dr. Zonia KiefStephens 12/19/2015  Procedures:   Chest x-ray 12/17/2015  V/Q scan 12/17/2015  CT head 12/19/2015  RIJ CVL under fluoro per interventional radiology Dr. Deanne CofferHassell 12/19/2015  Antimicrobials:   None   Subjective: Patient sitting up at bedside. No chest pain. No shortness of breath. No arrhythmias noted overnight. No bleeding.   Objective: Vitals:   12/20/15 0758 12/20/15 0800 12/20/15 0900 12/20/15 1114  BP: 130/60 138/65 (!) 112/94   Pulse: 90 90 84   Resp:   19   Temp:    97.9 F (36.6 C)  TempSrc:    Oral  SpO2: 100% 100% 100%   Weight:      Height:         Intake/Output Summary (Last 24 hours) at 12/20/15 1141 Last data filed at 12/20/15 0900  Gross per 24 hour  Intake          2644.46 ml  Output             3400 ml  Net          -755.54 ml   Filed Weights   12/18/15 0147 12/19/15 0352 12/19/15 2250  Weight: 99.3 kg (219 lb) 101.3 kg (223 lb 4.8 oz) 101.3 kg (223 lb 5.2 oz)    Examination:  General exam: Sitting up in chair. Respiratory system: Clear to auscultation Bilaterally. Respiratory effort normal. Cardiovascular system: S1 & S2 heard, RRR. No JVD, murmurs, rubs, gallops or clicks. No pedal edema. Gastrointestinal system: Abdomen is nondistended, soft and nontender. No organomegaly or masses felt. Normal bowel sounds heard. Central nervous system: Alert and oriented. No focal neurological deficits. Extremities: Symmetric 5 x 5 power. Skin: No rashes, lesions or ulcers Psychiatry: Judgement and insight appear normal. Mood & affect appropriate.     Data Reviewed: I have personally reviewed following labs and imaging studies  CBC:  Recent Labs Lab 12/17/15 1624 12/18/15 0529 12/19/15 0513 12/19/15 1855 12/20/15 0513  WBC 11.0* 10.2 8.0 9.3 7.2  NEUTROABS  --   --  4.8 5.9  --   HGB 8.9* 8.6* 8.3* 7.9* 7.7*  HCT 27.9* 27.0* 25.9* 24.8* 24.2*  MCV 86.6 86.8 87.5 87.0 86.4  PLT 51* 44* 47* 54* 55*   Basic Metabolic Panel:  Recent Labs Lab 12/17/15 1624 12/18/15 1047 12/19/15 0939  12/19/15 1855 12/20/15 0314 12/20/15 0801  NA 136 141 139 139 139  --   K 4.0 3.9 4.0 3.6 3.4*  --   CL 109 114* 113* 111 111  --   CO2 19* 22 20* 21* 21*  --   GLUCOSE 320* 178* 252* 208* 229*  --   BUN 30* 25* 25* 24* 24*  --   CREATININE 2.63* 2.18* 1.95* 1.90* 1.76*  --   CALCIUM 9.4 9.4 9.1 8.9 8.6*  --   MG  --   --   --  1.8  --  2.2  PHOS  --   --   --   --  2.9  --    GFR: Estimated Creatinine Clearance: 33.8 mL/min (by C-G formula based on SCr of 1.76 mg/dL (H)). Liver Function Tests:  Recent Labs Lab  12/18/15 1047 12/19/15 1855 12/20/15 0314  AST 74* 89*  --   ALT 59* 55*  --   ALKPHOS 73 62  --   BILITOT 0.5 0.5  --   PROT 6.3* 6.1*  --   ALBUMIN 2.8* 2.6* 2.4*   No results for input(s): LIPASE, AMYLASE in the last 168 hours.  Recent Labs Lab 12/19/15 1833  AMMONIA 16   Coagulation Profile:  Recent Labs Lab 12/17/15 2333  INR 1.32   Cardiac Enzymes:  Recent Labs Lab 12/18/15 0529 12/18/15 1047 12/19/15 1855 12/20/15 0314 12/20/15 0800  TROPONINI 0.89* 0.53* 0.22* 0.15* 0.15*   BNP (last 3 results) No results for input(s): PROBNP in the last 8760 hours. HbA1C:  Recent Labs  12/18/15 0529  HGBA1C 9.8*   CBG:  Recent Labs Lab 12/19/15 1132 12/19/15 1647 12/19/15 2052 12/20/15 0727 12/20/15 1106  GLUCAP 239* 188* 178* 208* 367*   Lipid Profile:  Recent Labs  12/18/15 0529  CHOL 81  HDL 29*  LDLCALC 34  TRIG 89  CHOLHDL 2.8   Thyroid Function Tests:  Recent Labs  12/19/15 1855  TSH 4.317   Anemia Panel:  Recent Labs  12/17/15 2333  VITAMINB12 284  FOLATE 10.4  FERRITIN 92  TIBC 277  IRON 43  RETICCTPCT 0.9   Sepsis Labs:  Recent Labs Lab 12/19/15 2140  LATICACIDVEN 1.2    Recent Results (from the past 240 hour(s))  MRSA PCR Screening     Status: None   Collection Time: 12/18/15  1:24 AM  Result Value Ref Range Status   MRSA by PCR NEGATIVE NEGATIVE Final    Comment:        The GeneXpert MRSA Assay (FDA approved for NASAL specimens only), is one component of a comprehensive MRSA colonization surveillance program. It is not intended to diagnose MRSA infection nor to guide or monitor treatment for MRSA infections.   Culture, blood (routine x 2)     Status: None (Preliminary result)   Collection Time: 12/19/15  9:02 PM  Result Value Ref Range Status   Specimen Description BLOOD RIGHT HAND  Final   Special Requests IN PEDIATRIC BOTTLE 1CC  Final   Culture NO GROWTH < 12 HOURS  Final   Report Status PENDING   Incomplete  Culture, blood (routine x 2)     Status: None (Preliminary result)   Collection Time: 12/19/15  9:09 PM  Result Value Ref Range Status   Specimen Description BLOOD LEFT HAND  Final   Special Requests IN PEDIATRIC BOTTLE 3CC  Final   Culture NO GROWTH < 12 HOURS  Final   Report Status PENDING  Incomplete         Radiology Studies: Ct Head Wo Contrast  Result Date: 12/19/2015 CLINICAL DATA:  Lethargy EXAM: CT HEAD WITHOUT CONTRAST TECHNIQUE: Contiguous axial images were obtained from the base of the skull through the vertex without intravenous contrast. COMPARISON:  Head CT 07/26/2014 FINDINGS: Brain: No mass lesion, intraparenchymal hemorrhage or extra-axial collection. No evidence of acute cortical infarct. Brain parenchyma and CSF-containing spaces are normal for age. Partially empty sella is again noted. Vascular: No hyperdense vessel or unexpected calcification. Skull: Normal visualized skull base, calvarium and extracranial soft tissues. Sinuses/Orbits: No sinus fluid levels or advanced mucosal thickening. No mastoid effusion. Normal orbits. IMPRESSION: Normal head CT for age. Electronically Signed   By: Deatra RobinsonKevin  Herman M.D.   On: 12/19/2015 22:50   Ir Fluoro Guide Cv Line Right  Result Date: 12/19/2015 CLINICAL DATA:  Poor venous access.  Chronic kidney disease. EXAM: RIGHT IJ CENTRAL VENOUS CATHETER PLACEMENT UNDER ULTRASOUND AND FLUOROSCOPIC GUIDANCE FLUOROSCOPY TIME:  Less than 6 seconds TECHNIQUE: The procedure, risks (including but not limited to bleeding, infection, organ damage ), benefits, and alternatives were explained to the patient. Questions regarding the procedure were encouraged and answered. The patient understands and consents to the procedure. Patency of the right IJ vein was confirmed with ultrasound with image documentation. An appropriate skin site was determined. Skin site was marked. Region was prepped using maximum barrier technique including cap and  mask, sterile gown, sterile gloves, large sterile sheet, and Chlorhexidine as cutaneous antisepsis. The region was infiltrated locally with 1% lidocaine. Under real-time ultrasound guidance, the right IJ vein was accessed with a 21 gauge micropuncture needle; the needle tip within the vein was confirmed with ultrasound image documentation. The needle exchanged over a guidewire for peel-away sheath through which an 18cm 5 [place] PowerPICC dual lumen catheter was advanced. This was positioned with the tip at the cavoatrial junction. Spot chest radiograph shows good positioning and no pneumothorax. Catheter was flushed and sutured externally with 0-Prolene sutures. Patient tolerated the procedure well. COMPLICATIONS: None immediate IMPRESSION: 1. Technically successful right IJ double lumen power injectable central venous catheter placement. Electronically Signed   By: Corlis Leak  Hassell M.D.   On: 12/19/2015 14:05   Ir [place]as Guide Vasc Access Right  Result Date: 12/19/2015 CLINICAL DATA:  Poor venous access.  Chronic kidney disease. EXAM: RIGHT IJ CENTRAL VENOUS CATHETER PLACEMENT UNDER ULTRASOUND AND FLUOROSCOPIC GUIDANCE FLUOROSCOPY TIME:  Less than 6 seconds TECHNIQUE: The procedure, risks (including but not limited to bleeding, infection, organ damage ), benefits, and alternatives were explained to the patient. Questions regarding the procedure were encouraged and answered. The patient understands and consents to the procedure. Patency of the right IJ vein was confirmed with ultrasound with image documentation. An appropriate skin site was determined. Skin site was marked. Region was prepped using maximum barrier technique including cap and mask, sterile gown, sterile gloves, large sterile sheet, and Chlorhexidine as cutaneous antisepsis. The region was infiltrated locally with 1% lidocaine. Under real-time ultrasound guidance, the right IJ vein was accessed with a 21 gauge micropuncture needle; the needle tip within  the vein was confirmed with ultrasound image documentation. The needle exchanged over a guidewire for peel-away sheath through which an 18cm 5 [place] PowerPICC dual lumen catheter was advanced. This was positioned with the tip at the cavoatrial junction. Spot chest radiograph shows good positioning and no pneumothorax. Catheter was flushed and sutured externally with 0-Prolene sutures. Patient tolerated the procedure well. COMPLICATIONS: None immediate IMPRESSION:  1. Technically successful right IJ double lumen power injectable central venous catheter placement. Electronically Signed   By: Corlis Leak M.D.   On: 12/19/2015 14:05        Scheduled Meds: . amLODipine  10 mg Oral Daily  . aspirin  81 mg Oral Daily  . doxazosin  4 mg Oral QHS  . ezetimibe  10 mg Oral QHS  . feeding supplement (PRO-STAT SUGAR FREE 64)  30 mL Oral BID  . Ferrous Fumarate  1 tablet Oral BID  . gabapentin  400 mg Oral TID  . hydrALAZINE  50 mg Oral TID  . insulin aspart  0-5 Units Subcutaneous QHS  . insulin aspart  0-9 Units Subcutaneous TID WC  . insulin glargine  14 Units Subcutaneous Q2200  . mometasone-formoterol  2 puff Inhalation BID  . pantoprazole  40 mg Oral Daily  . rosuvastatin  40 mg Oral QHS   Continuous Infusions: . sodium chloride 75 mL/hr at 12/20/15 0600  . argatroban 2.04 mcg/kg/min (12/20/15 1006)     LOS: 3 days    Time spent: 40 mins    THOMPSON,DANIEL, MD [facility] Pager [phone]  If 7PM-7AM, please contact night-coverage [email or web address] Password [email or web address] 12/20/2015, 11:41 AM

## 2015-12-20 NOTE — Progress Notes (Signed)
eLink Physician-Brief Progress Note Patient Name: Heidi Campbell DOB: 1946/01/25 MRN: [id number]   Date of Service  12/20/2015  HPI/Events of Note  Patient transferred to the intensive care unit for closer monitoring with intermittent wide complex tachycardia. Hypomagnesemia replaced with 2 g magnesium sulfate IV. Camera check shows patient sleeping. Currently normal sinus rhythm. Blood pressure stable.   eICU Interventions  1. Repeat electrolytes with a.m. labs 2. KCl 30 mEq IV 3. Continue telemetry monitoring      Intervention Category Evaluation Type: New Patient Evaluation  Lawanda CousinsJennings Magdaline Zollars 12/20/2015, 12:15 AM

## 2015-12-20 NOTE — Evaluation (Signed)
Physical Therapy Evaluation Patient Details Name: Orson Aloevon V Breeding MRN: [id number] DOB: Sep 16, 1945 Today's Date: 12/20/2015   History of Present Illness  70 y.o. with a history of diabetes mellitus, hypertension, hyperlipidemia, CAD, laminectomy in oct 2017, wound infection and sepsis 11/11. Readmitted with NSTEMI  Clinical Impression  Pt with flat affect and required repeated questions throughout to provide clear answer of PLOF and plan as pt would provide conflicting answers. Pt without brace available, states she was only using a WC and then when walking stated she was walking some. Unclear how much [facility]. Pt with decreased cognition, mobility and activity tolerance who will benefit from acute therapy to maximize mobility and independence to decrease burden of care. Pt encouraged to be OOB daily with nursing staff  HR 90-120 with gait sats 100% BP 121/50 before, 130/60 after    Follow Up Recommendations SNF    Equipment Recommendations  Rolling walker with 5" wheels    Recommendations for Other Services       Precautions / Restrictions Precautions Precautions: Back;Fall Precaution Comments: pt educated for back precautions Required Braces or Orthoses: Spinal Brace Spinal Brace: Applied in sitting position Restrictions Other Position/Activity Restrictions: no current orders for back brace, taken from surgical admission and pt without brace at this time      Mobility  Bed Mobility Overal bed mobility: Needs Assistance Bed Mobility: Rolling;Sidelying to Sit Rolling: Min guard Sidelying to sit: Min assist       General bed mobility comments: cues for sequence with assist to bring legs off of bed  Transfers Overall transfer level: Needs assistance   Transfers: Sit to/from Stand Sit to Stand: Min guard         General transfer comment: cues for hand placement and posture  Ambulation/Gait Ambulation/Gait assistance: Min  guard Ambulation Distance (Feet): 110 Feet Assistive device: Rolling walker (2 wheeled) Gait Pattern/deviations: Step-through pattern;Decreased stride length   Gait velocity interpretation: Below normal speed for age/gender General Gait Details: flexed hips with gait, cues for posture, position in RW and position in hall as pt tends to stay next to wall  Stairs            Wheelchair Mobility    Modified Rankin (Stroke Patients Only)       Balance     Sitting balance-Leahy Scale: Fair       Standing balance-Leahy Scale: Poor                               Pertinent Vitals/Pain Pain Assessment: No/denies pain    Home Living Family/patient expects to be discharged to:: Skilled nursing facility Living Arrangements: Children Available Help at Discharge: Family;Available 24 hours/day Type of Home: House Home Access: Stairs to enter Entrance Stairs-Rails: Can reach both Entrance Stairs-Number of Steps: 2 Home Layout: One level Home Equipment: Grab bars - tub/shower;Grab bars - toilet;Shower seat - built in;Crutches      Prior Function Level of Independence: Needs assistance   Gait / Transfers Assistance Needed: pt reports she has had assist for bathing, dressing and mobility since surgery. Was independent prior to October           Hand Dominance        Extremity/Trunk Assessment   Upper Extremity Assessment: Generalized weakness           Lower Extremity Assessment: Generalized weakness      Cervical / Trunk  Assessment: Kyphotic  Communication   Communication: No difficulties  Cognition Arousal/Alertness: Awake/alert Behavior During Therapy: Flat affect Overall Cognitive Status: Impaired/Different from baseline Area of Impairment: Memory;Problem solving     Memory: Decreased recall of precautions;Decreased short-term memory       Problem Solving: Slow processing      General Comments      Exercises      Assessment/Plan    PT Assessment Patient needs continued PT services  PT Problem List Decreased strength;Decreased mobility;Decreased activity tolerance;Decreased balance;Decreased knowledge of use of DME;Decreased cognition;Decreased knowledge of precautions          PT Treatment Interventions DME instruction;Gait training;Stair training;Functional mobility training;Therapeutic activities;Therapeutic exercise;Balance training;Neuromuscular re-education;Cognitive remediation;Patient/family education    PT Goals (Current goals can be found in the Care Plan section)  Acute Rehab PT Goals Patient Stated Goal: [facility] PT Goal Formulation: With patient Time For Goal Achievement: 01/03/16 Potential to Achieve Goals: Good    Frequency Min 3X/week   Barriers to discharge        Co-evaluation               End of Session Equipment Utilized During Treatment: Gait belt Activity Tolerance: Patient tolerated treatment well Patient left: in chair;with call bell/phone within reach Nurse Communication: Mobility status         Time: 0729-0753 PT Time Calculation (min) (ACUTE ONLY): 24 min   Charges:   PT Evaluation $PT Eval Moderate Complexity: 1 Procedure     PT G Codes:        Gizelle Whetsel B Brondon Wann 12/20/2015, 8:03 AM  Delaney MeigsMaija Tabor Kazuki Ingle, PT [phone]

## 2015-12-20 NOTE — Progress Notes (Addendum)
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   Labs:  Recent Labs  12/17/15 2333 12/18/15 0529 12/18/15 0745 12/18/15 1047  12/19/15 0513 12/19/15 0939 12/19/15 1545 12/19/15 1855 12/19/15 2140 12/20/15 0127  HGB  --  8.6*  --   --   --  8.3*  --   --  7.9*  --   --   HCT  --  27.0*  --   --   --  25.9*  --   --  24.8*  --   --   PLT  --  44*  --   --   --  47*  --   --  54*  --   --   APTT  --   --   --   --   < >  --  47* 47*  --  48* 67*  LABPROT 16.5*  --   --   --   --   --   --   --   --   --   --   INR 1.32  --   --   --   --   --   --   --   --   --   --   HEPARINUNFRC  --   --  0.66  --   --   --   --   --   --   --   --   CREATININE  --   --   --  2.18*  --   --  1.95*  --  1.90*  --   --   TROPONINI 1.24* 0.89*  --  0.53*  --   --   --   --  0.22*  --   --   < > = values in this interval not displayed.   Assessment/Plan:  70yo female therapeutic on argatroban after rate changes. Will continue gtt at current rate and confirm stable with additional PTT.   Vernard GamblesVeronda Fenix Rorke, PharmD, BCPS  12/20/2015,2:30 AM   ADDENDUM: PTT this am is 68, remains therapeutic.  Will continue to monitor daily PTTs. VB 6:18 AM

## 2015-12-21 LAB — GLUCOSE, CAPILLARY
GLUCOSE-CAPILLARY: 226 mg/dL — AB (ref 65–99)
GLUCOSE-CAPILLARY: 265 mg/dL — AB (ref 65–99)
Glucose-Capillary: 157 mg/dL — ABNORMAL HIGH (ref 65–99)
Glucose-Capillary: 283 mg/dL — ABNORMAL HIGH (ref 65–99)

## 2015-12-21 LAB — URINE CULTURE

## 2015-12-21 LAB — BASIC METABOLIC PANEL
ANION GAP: 6 (ref 5–15)
BUN: 23 mg/dL — ABNORMAL HIGH (ref 6–20)
CO2: 20 mmol/L — AB (ref 22–32)
Calcium: 8.7 mg/dL — ABNORMAL LOW (ref 8.9–10.3)
Chloride: 114 mmol/L — ABNORMAL HIGH (ref 101–111)
Creatinine, Ser: 1.55 mg/dL — ABNORMAL HIGH (ref 0.44–1.00)
GFR calc Af Amer: 38 mL/min — ABNORMAL LOW (ref >60)
GFR calc non Af Amer: 33 mL/min — ABNORMAL LOW (ref >60)
GLUCOSE: 173 mg/dL — AB (ref 65–99)
POTASSIUM: 3.7 mmol/L (ref 3.5–5.1)
Sodium: 140 mmol/L (ref 135–145)

## 2015-12-21 LAB — CBC
HEMATOCRIT: 22.9 % — AB (ref 36.0–46.0)
Hemoglobin: 7.3 g/dL — ABNORMAL LOW (ref 12.0–15.0)
MCH: 27.5 pg (ref 26.0–34.0)
MCHC: 31.9 g/dL (ref 30.0–36.0)
MCV: 86.4 fL (ref 78.0–100.0)
Platelets: 72 10*3/uL — ABNORMAL LOW (ref 150–400)
RBC: 2.65 MIL/uL — AB (ref 3.87–5.11)
RDW: 13.3 % (ref 11.5–15.5)
WBC: 8 10*3/uL (ref 4.0–10.5)

## 2015-12-21 LAB — APTT: aPTT: 64 seconds — ABNORMAL HIGH (ref 24–36)

## 2015-12-21 LAB — MAGNESIUM: Magnesium: 1.9 mg/dL (ref 1.7–2.4)

## 2015-12-21 MED ORDER — DARBEPOETIN ALFA 40 MCG/0.4ML IJ SOSY
40.0000 ug | PREFILLED_SYRINGE | INTRAMUSCULAR | Status: DC
  Administered 2015-12-22: 40 ug via SUBCUTANEOUS
  Filled 2015-12-21 (×3): qty 0.4

## 2015-12-21 MED ORDER — INSULIN ASPART 100 UNIT/ML ~~LOC~~ SOLN
3.0000 [IU] | Freq: Three times a day (TID) | SUBCUTANEOUS | Status: DC
  Administered 2015-12-22 – 2015-12-24 (×4): 3 [IU] via SUBCUTANEOUS

## 2015-12-21 MED ORDER — SODIUM CHLORIDE 0.9 % IV SOLN
510.0000 mg | Freq: Once | INTRAVENOUS | Status: AC
  Administered 2015-12-21: 510 mg via INTRAVENOUS
  Filled 2015-12-21 (×2): qty 17

## 2015-12-21 NOTE — Progress Notes (Signed)
Occupational Therapy Evaluation Patient Details Name: Heidi Campbell MRN: [id number] DOB: Jun 04, 1945 Today's Date: 12/21/2015    History of Present Illness 70 y.o. with a history of diabetes mellitus, hypertension, hyperlipidemia, CAD, laminectomy in oct 2017, wound infection and sepsis 11/11. Readmitted with NSTEMI   Clinical Impression   PTA, [facility]. Pt will benefit [facility]. VSS during session. Pt complained of fatigue. Will follow acutely to maximize functional level of independence to facilitate safe DC to SNF.     Follow Up Recommendations  SNF;Supervision/Assistance - 24 hour    Equipment Recommendations  Other (comment) (TBA at SNF)    Recommendations for Other Services       Precautions / Restrictions Precautions Precautions: Back;Fall Required Braces or Orthoses: Spinal Brace Spinal Brace: Applied in sitting position      Mobility Bed Mobility Overal bed mobility: Needs Assistance Bed Mobility: Rolling;Sidelying to Sit Rolling: Supervision Sidelying to sit: Min assist       General bed mobility comments: assist to transition to sit EOB  Transfers Overall transfer level: Needs assistance   Transfers: Sit to/from Stand;Stand Pivot Transfers Sit to Stand: Min assist Stand pivot transfers: Min assist            Balance Overall balance assessment: Needs assistance   Sitting balance-Leahy Scale: Fair       Standing balance-Leahy Scale: Poor                              ADL Overall ADL's : Needs assistance/impaired Eating/Feeding: Independent;Sitting   Grooming: Set up   Upper Body Bathing: Set up   Lower Body Bathing: Maximal assistance;Sit to/from stand   Upper Body Dressing : Set up;Sitting   Lower Body Dressing: Maximal assistance;Sit to/from stand   Toilet Transfer: Moderate assistance;BSC;Stand-pivot   Toileting- Clothing Manipulation and Hygiene: Maximal assistance        Functional mobility during ADLs: Minimal assistance (stand pivot transfer only. very slow) General ADL Comments: Pt requries mod vc to follow back precautions     Vision     Perception     Praxis      Pertinent Vitals/Pain Pain Assessment: Faces Faces Pain Scale: Hurts a little bit Pain Location: back Pain Descriptors / Indicators: Discomfort Pain Intervention(s): Limited activity within patient's tolerance     Hand Dominance Right   Extremity/Trunk Assessment Upper Extremity Assessment Upper Extremity Assessment: Generalized weakness   Lower Extremity Assessment Lower Extremity Assessment: Defer to PT evaluation   Cervical / Trunk Assessment Cervical / Trunk Assessment: Kyphotic   Communication Communication Communication: No difficulties   Cognition Arousal/Alertness: Awake/alert Behavior During Therapy: Flat affect Overall Cognitive Status: No family/caregiver present to determine baseline cognitive functioning       Memory: Decreased short-term memory;Decreased recall of precautions       Problem Solving: Slow processing     General Comments       Exercises       Shoulder Instructions      Home Living Family/patient expects to be discharged to:: Skilled nursing facility                                        Prior Functioning/Environment Level of Independence: Needs assistance  Gait / Transfers Assistance Needed: pt reports she has had assist for bathing,  dressing and mobility since surgery. Was independent prior to October     Comments: was walking at facility - unsure of distance        OT Problem List: Decreased strength;Decreased range of motion;Decreased activity tolerance;Impaired balance (sitting and/or standing);Decreased safety awareness;Decreased knowledge of use of DME or AE;Decreased knowledge of precautions;Cardiopulmonary status limiting activity;Obesity;Pain   OT Treatment/Interventions: Self-care/ADL  training;Therapeutic exercise;Energy conservation;DME and/or AE instruction;Therapeutic activities;Patient/family education;Balance training    OT Goals(Current goals can be found in the care plan section) Acute Rehab OT Goals Patient Stated Goal[facility] Goal Formulation: With patient Time For Goal Achievement: 12/31/15 Potential to Achieve Goals: Good  OT Frequency: Min 2X/week   Barriers to D/C:            Co-evaluation              End of Session Equipment Utilized During Treatment: Gait belt;Back brace Nurse Communication: Mobility status;Precautions  Activity Tolerance: Patient tolerated treatment well Patient left: in chair;with call bell/phone within reach   Time: 0927-0955 OT Time Calculation (min): 28 min Charges:  OT General Charges $OT Visit: 1 Procedure OT Evaluation $OT Eval Moderate Complexity: 1 Procedure OT Treatments $Self Care/Home Management : 8-22 mins G-Codes:    Heidi Campbell,HILLARY 12/21/2015, 10:42 AM   Heidi Campbell, OTR/L  [phone] 12/21/2015

## 2015-12-21 NOTE — Progress Notes (Signed)
PROGRESS NOTE    Heidi Campbell  ONG:[id number] DOB: [birth year] DOA: 12/17/2015 PCP: Astrid DivineGRIFFIN,ELAINE COLLINS, MD   Brief Narrative:  Heidi Campbell is a 70 y.o. female with medical history significant of hypertension, hyperlipidemia, diabetes mellitus, asthma, GERD, CAD, CKD-IV, arthritis, anemia, who presents with chest pain.  Patient states that her chest pain started this morning. It is located in the substernal area, constant, 7 out of 10 in severity, pressure-like pain. Nonradiating. It is not aggravated or alleviated by any known factors. It is associated with mild shortness of breath. No cough, fever or chills. She has left anterior leg pain, but no tenderness over calf areas. Patient was given aspirin and nitroglycerin. Her chest pain has subsided. Currently no chest pain and shortness of breath. Patient denies nausea, vomiting, abdominal, symptoms for UTI, diarrhea, unilateral weakness. Of note, pt was noted to have A fib with RVR by EMS, but no EKG recorded. Her EKG did not show A fib in ED.   Assessment & Plan:   Principal Problem:   NSTEMI (non-ST elevated myocardial infarction) (HCC) Active Problems:   Type 2 diabetes mellitus treated with insulin (HCC)   Esophageal reflux   Hypertension   Coronary atherosclerosis of native coronary artery   Hyperlipidemia   CKD (chronic kidney disease), stage IV (HCC)   Atypical chest pain   Normocytic anemia   Thrombocytopenia (HCC)   Elevated troponin   Lethargy   HIT (heparin-induced thrombocytopenia) (HCC)  #1 non-ST elevated MI Patient had presented with chest pain relieved with nitroglycerin. Troponin is trending down. Patient currently chest pain-free. Patient recently hospitalized for acute renal failure with critical hyperkalemia and creatinine up to 10 requiring emergent hemodialysis. Patient's renal function improving however has not normalized.. Patient also noted to have a thrombocytopenia with a platelet count of 51 which  has worsened and now at 44. Due to thrombocytopenia and current creatinine which has not yet normalized cardiology hesitant to perform cardiac catheterization on the patient. IV heparin has been changed to argatroban per cardiology. Continue low-dose aspirin at 81 mg daily, zetia, hydralazine, Crestor. Patient would recent 2-D echo done on 12/05/2015 with a EF of 60-65% with mild LVH. Repeat 2-D echo pending. Cardiology at this time are not recommended cardiac cath and I recommended outpatient stress test to evaluate for high-risk ischemia.  Nephrology has been consulted and recommended continue IV fluids until renal function is close to baseline or if catheterization needed to be done earlier to continue with IV fluids and minimize contrast. Cardiology following and appreciate input and recommendations.  #2 nonsustained V. tach Patient noted to have several beats of nonsustained V. tach evening of 12/19/2015, with some associated lethargy. Patient was given magnesium 2 g IV 1 and moved to the ICU for closer monitoring. Cardiac enzymes which was cycled were elevated. Patient with no further arrhythmias noted in the ICU. Repeat magnesium is 2.2. Potassium is at 3.4 and will replete. Cardiology reviewed strips and feel likely more secondary to artifact. Cardiology following and appreciate input and recommendations.  #3 type 2 diabetes mellitus CBGs have ranged from 157-265. Hemoglobin A1c was 11.2 on 10/30/2015. Hemoglobin A1c 9.8. Increased Lantus 14 units daily. Will admit coverage NovoLog 3 units daily with meals. Continue sliding scale insulin.  #4 recent acute renal failure Patient's last hospitalization from 12/05/2015-12/12/2015 was noted to be in acute renal failure with a creatinine as high as 10. Patient had to undergo emergent hemodialysis also secondary to severe hyperkalemia. Renal function has been  gradually improving however has not normalized yet. Creatinine currently at 1.55 from 1.76 from  1.95 from 2.18. No nephrotoxins. Gentle hydration. Patient presented with a non-STEMI patient likely needing cardiac catheterization to be done early next week per cardiology was requesting nephrology consultation to optimize patient's renal function. Patient has been seen in consultation by nephrology will recommend to continue ongoing hydration with IV fluids at 75 mL per hour.  Per cardiology due to recent events at this time are not recommended cardiac catheterization and would consider outpatient stress test to evaluate for high-risk ischemia. Continue gentle hydration and monitor renal function.  Appreciate nephrology input and recommendations.  #5 gastroesophageal reflux disease PPI.  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 During last hospitalization patient's platelet count was normal. On admission platelet count of 51. Platelet count today at 72 from 55 from 47 from 44. IV heparin has been changed to argatroban.LDH slightly elevated at 361. Haptoglobin elevated at 523. Bilirubin was normal. No schistocytes on smears. Once INR is greater than 100,000 may consider starting patient on Coumadin.  #7 hypertension Continue Cardura, Norvasc, hydralazine.  #8 hyperlipidemia Continue Zetia and Crestor.  #9 normocytic anemia Follow H&H.   DVT prophylaxis: argatroban Code Status: Full Family Communication: Updated patient. No family at bedside. Disposition Plan: Skilled nursing facility when medically stable.   Consultants:   Cardiology: Dr. Rennis GoldenHilty 12/18/2015  Nephrology: Dr. Signe ColtUpton 12/19/2015  PCCM: Dr. Zonia KiefStephens 12/19/2015  Procedures:   Chest x-ray 12/17/2015  V/Q scan 12/17/2015  CT head 12/19/2015  RIJ CVL under fluoro per interventional radiology Dr. Deanne CofferHassell 12/19/2015  Antimicrobials:   None   Subjective: Patient sitting up at bedside. No chest pain. No shortness of breath. No arrhythmias noted overnight. No bleeding.   Objective: Vitals:     12/20/15 2012 12/20/15 2100 12/20/15 2153 12/21/15 0300  BP:  (!) 156/73 133/72 (!) 147/56  Pulse:  82 87 85  Resp:  15 14 15   Temp: 98.8 F (37.1 C)  98.5 F (36.9 C) 99.6 F (37.6 C)  TempSrc: Oral     SpO2:  100% 100% 99%  Weight:    101.7 kg (224 lb 3.2 oz)  Height:        Intake/Output Summary (Last 24 hours) at 12/21/15 1218 Last data filed at 12/21/15 0957  Gross per 24 hour  Intake           1799.6 ml  Output             2175 ml  Net           -375.4 ml   Filed Weights   12/19/15 0352 12/19/15 2250 12/21/15 0300  Weight: 101.3 kg (223 lb 4.8 oz) 101.3 kg (223 lb 5.2 oz) 101.7 kg (224 lb 3.2 oz)    Examination:  General exam: Sitting up in chair. Respiratory system: Clear to auscultation Bilaterally. Respiratory effort normal. Cardiovascular system: S1 & S2 heard, RRR. No JVD, murmurs, rubs, gallops or clicks. No pedal edema. Gastrointestinal system: Abdomen is nondistended, soft and nontender. No organomegaly or masses felt. Normal bowel sounds heard. Central nervous system: Alert and oriented. No focal neurological deficits. Extremities: Symmetric 5 x 5 power. Skin: No rashes, lesions or ulcers Psychiatry: Judgement and insight appear normal. Mood & affect appropriate.     Data Reviewed: I have personally reviewed following labs and imaging studies  CBC:  Recent Labs Lab 12/18/15 0529 12/19/15 0513 12/19/15 1855 12/20/15 0513 12/21/15 0344  WBC 10.2 8.0 9.3  7.2 8.0  NEUTROABS  --  4.8 5.9  --   --   HGB 8.6* 8.3* 7.9* 7.7* 7.3*  HCT 27.0* 25.9* 24.8* 24.2* 22.9*  MCV 86.8 87.5 87.0 86.4 86.4  PLT 44* 47* 54* 55* 72*   Basic Metabolic Panel:  Recent Labs Lab 12/18/15 1047 12/19/15 0939 12/19/15 1855 12/20/15 0314 12/20/15 0801 12/21/15 0344  NA 141 139 139 139  --  140  K 3.9 4.0 3.6 3.4*  --  3.7  CL 114* 113* 111 111  --  114*  CO2 22 20* 21* 21*  --  20*  GLUCOSE 178* 252* 208* 229*  --  173*  BUN 25* 25* 24* 24*  --  23*   CREATININE 2.18* 1.95* 1.90* 1.76*  --  1.55*  CALCIUM 9.4 9.1 8.9 8.6*  --  8.7*  MG  --   --  1.8  --  2.2 1.9  PHOS  --   --   --  2.9  --   --    GFR: Estimated Creatinine Clearance: 38.4 mL/min (by C-G formula based on SCr of 1.55 mg/dL (H)). Liver Function Tests:  Recent Labs Lab 12/18/15 1047 12/19/15 1855 12/20/15 0314  AST 74* 89*  --   ALT 59* 55*  --   ALKPHOS 73 62  --   BILITOT 0.5 0.5  --   PROT 6.3* 6.1*  --   ALBUMIN 2.8* 2.6* 2.4*   No results for input(s): LIPASE, AMYLASE in the last 168 hours.  Recent Labs Lab 12/19/15 1833  AMMONIA 16   Coagulation Profile:  Recent Labs Lab 12/17/15 2333  INR 1.32   Cardiac Enzymes:  Recent Labs Lab 12/18/15 0529 12/18/15 1047 12/19/15 1855 12/20/15 0314 12/20/15 0800  TROPONINI 0.89* 0.53* 0.22* 0.15* 0.15*   BNP (last 3 results) No results for input(s): PROBNP in the last 8760 hours. HbA1C: No results for input(s): HGBA1C in the last 72 hours. CBG:  Recent Labs Lab 12/20/15 1106 12/20/15 1644 12/20/15 2115 12/21/15 0807 12/21/15 1212  GLUCAP 367* 280* 224* 157* 226*   Lipid Profile: No results for input(s): CHOL, HDL, LDLCALC, TRIG, CHOLHDL, LDLDIRECT in the last 72 hours. Thyroid Function Tests:  Recent Labs  12/19/15 1855  TSH 4.317   Anemia Panel: No results for input(s): VITAMINB12, FOLATE, FERRITIN, TIBC, IRON, RETICCTPCT in the last 72 hours. Sepsis Labs:  Recent Labs Lab 12/19/15 2140  LATICACIDVEN 1.2    Recent Results (from the past 240 hour(s))  MRSA PCR Screening     Status: None   Collection Time: 12/18/15  1:24 AM  Result Value Ref Range Status   MRSA by PCR NEGATIVE NEGATIVE Final    Comment:        The GeneXpert MRSA Assay (FDA approved for NASAL specimens only), is one component of a comprehensive MRSA colonization surveillance program. It is not intended to diagnose MRSA infection nor to guide or monitor treatment for MRSA infections.   Culture,  Urine     Status: Abnormal   Collection Time: 12/19/15  8:01 PM  Result Value Ref Range Status   Specimen Description URINE, RANDOM  Final   Special Requests NONE  Final   Culture MULTIPLE SPECIES PRESENT, SUGGEST RECOLLECTION (A)  Final   Report Status 12/21/2015 FINAL  Final  Culture, blood (routine x 2)     Status: None (Preliminary result)   Collection Time: 12/19/15  9:02 PM  Result Value Ref Range Status   Specimen Description BLOOD  RIGHT HAND  Final   Special Requests IN PEDIATRIC BOTTLE 1CC  Final   Culture NO GROWTH < 12 HOURS  Final   Report Status PENDING  Incomplete  Culture, blood (routine x 2)     Status: None (Preliminary result)   Collection Time: 12/19/15  9:09 PM  Result Value Ref Range Status   Specimen Description BLOOD LEFT HAND  Final   Special Requests IN PEDIATRIC BOTTLE 3CC  Final   Culture NO GROWTH < 12 HOURS  Final   Report Status PENDING  Incomplete         Radiology Studies: Ct Head Wo Contrast  Result Date: 12/19/2015 CLINICAL DATA:  Lethargy EXAM: CT HEAD WITHOUT CONTRAST TECHNIQUE: Contiguous axial images were obtained from the base of the skull through the vertex without intravenous contrast. COMPARISON:  Head CT 07/26/2014 FINDINGS: Brain: No mass lesion, intraparenchymal hemorrhage or extra-axial collection. No evidence of acute cortical infarct. Brain parenchyma and CSF-containing spaces are normal for age. Partially empty sella is again noted. Vascular: No hyperdense vessel or unexpected calcification. Skull: Normal visualized skull base, calvarium and extracranial soft tissues. Sinuses/Orbits: No sinus fluid levels or advanced mucosal thickening. No mastoid effusion. Normal orbits. IMPRESSION: Normal head CT for age. Electronically Signed   By: Deatra Robinson M.D.   On: 12/19/2015 22:50   Ir Fluoro Guide Cv Line Right  Result Date: 12/19/2015 CLINICAL DATA:  Poor venous access.  Chronic kidney disease. EXAM: RIGHT IJ CENTRAL VENOUS CATHETER  PLACEMENT UNDER ULTRASOUND AND FLUOROSCOPIC GUIDANCE FLUOROSCOPY TIME:  Less than 6 seconds TECHNIQUE: The procedure, risks (including but not limited to bleeding, infection, organ damage ), benefits, and alternatives were explained to the patient. Questions regarding the procedure were encouraged and answered. The patient understands and consents to the procedure. Patency of the right IJ vein was confirmed with ultrasound with image documentation. An appropriate skin site was determined. Skin site was marked. Region was prepped using maximum barrier technique including cap and mask, sterile gown, sterile gloves, large sterile sheet, and Chlorhexidine as cutaneous antisepsis. The region was infiltrated locally with 1% lidocaine. Under real-time ultrasound guidance, the right IJ vein was accessed with a 21 gauge micropuncture needle; the needle tip within the vein was confirmed with ultrasound image documentation. The needle exchanged over a guidewire for peel-away sheath through which an 18cm 5 [place] PowerPICC dual lumen catheter was advanced. This was positioned with the tip at the cavoatrial junction. Spot chest radiograph shows good positioning and no pneumothorax. Catheter was flushed and sutured externally with 0-Prolene sutures. Patient tolerated the procedure well. COMPLICATIONS: None immediate IMPRESSION: 1. Technically successful right IJ double lumen power injectable central venous catheter placement. Electronically Signed   By: Corlis Leak M.D.   On: 12/19/2015 14:05   Ir US Guide Vasc Access Right  Result Date: 12/19/2015 CLINICAL DATA:  Poor venous access.  Chronic kidney disease. EXAM: RIGHT IJ CENTRAL VENOUS CATHETER PLACEMENT UNDER ULTRASOUND AND FLUOROSCOPIC GUIDANCE FLUOROSCOPY TIME:  Less than 6 seconds TECHNIQUE: The procedure, risks (including but not limited to bleeding, infection, organ damage ), benefits, and alternatives were explained to the patient. Questions regarding the procedure  were encouraged and answered. The patient understands and consents to the procedure. Patency of the right IJ vein was confirmed with ultrasound with image documentation. An appropriate skin site was determined. Skin site was marked. Region was prepped using maximum barrier technique including cap and mask, sterile gown, sterile gloves, large sterile sheet, and Chlorhexidine as cutaneous antisepsis.  The region was infiltrated locally with 1% lidocaine. Under real-time ultrasound guidance, the right IJ vein was accessed with a 21 gauge micropuncture needle; the needle tip within the vein was confirmed with ultrasound image documentation. The needle exchanged over a guidewire for peel-away sheath through which an 18cm 5 [place] PowerPICC dual lumen catheter was advanced. This was positioned with the tip at the cavoatrial junction. Spot chest radiograph shows good positioning and no pneumothorax. Catheter was flushed and sutured externally with 0-Prolene sutures. Patient tolerated the procedure well. COMPLICATIONS: None immediate IMPRESSION: 1. Technically successful right IJ double lumen power injectable central venous catheter placement. Electronically Signed   By: Corlis Leak M.D.   On: 12/19/2015 14:05        Scheduled Meds: . amLODipine  10 mg Oral Daily  . aspirin  81 mg Oral Daily  . doxazosin  4 mg Oral QHS  . ezetimibe  10 mg Oral QHS  . feeding supplement (PRO-STAT SUGAR FREE 64)  30 mL Oral BID  . Ferrous Fumarate  1 tablet Oral BID  . gabapentin  400 mg Oral TID  . hydrALAZINE  50 mg Oral TID  . insulin aspart  0-5 Units Subcutaneous QHS  . insulin aspart  0-9 Units Subcutaneous TID WC  . insulin glargine  14 Units Subcutaneous Q2200  . mometasone-formoterol  2 puff Inhalation BID  . pantoprazole  40 mg Oral Daily  . rosuvastatin  40 mg Oral QHS   Continuous Infusions: . sodium chloride 75 mL/hr at 12/21/15 0234  . argatroban 2.04 mcg/kg/min (12/21/15 0831)     LOS: 4 days     Time spent: 35 mins    Vilda Zollner, MD [facility] Pager [phone]  If 7PM-7AM, please contact night-coverage [email or web address] Password [email or web address] 12/21/2015, 12:18 PM

## 2015-12-21 NOTE — Progress Notes (Signed)
[facility] Progress Note    Assessment/ Plan:   1.  Resolving AKI: required one session of dialysis 11/3 for hyperkalemia.  Creatinine near baseline, apparently was 1.0 before AKI.  had some fluids in prep for cath, will d/c these now.   2.  Nonsustained Vtach/ NSTEMI: Mg level pending, 2 g IV mag given.  cardiology following.  Cath cancelled, outpatient stress instead.  3.  Fever: combined with confusion, wonder about occult sepsis.  Urine culture with multiple species and blood cultures x 2 negative.  No real source though.    4.   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  On argatroban gtt. plts increasing to 75 today  5.  Anemia: haptoglobin not reflective of hemolysis.  No schistos on smear.  Investigating iron studies - % sat 16, Hgb 7.2, will actually give feraheme x 1 (11/19) and Aranesp 40 mg IV q week (11/19).  Subjective:    Cath cancelled, will do outpatient stress instead.   Objective:   BP (!) 147/56   Pulse 85   Temp 99.6 F (37.6 C)   Resp 15   Ht 5\' 3"  (1.6 m)   Wt 101.7 kg (224 lb 3.2 oz)   SpO2 99%   BMI 39.72 kg/m   Intake/Output Summary (Last 24 hours) at 12/21/15 1432 Last data filed at 12/21/15 1425  Gross per 24 hour  Intake           1425.2 ml  Output             2250 ml  Net           -824.8 ml   Weight change: 0.396 kg (14 oz)  Physical Exam: GEN: NAD, sitting in chair, not confused today, son at bedside HEENT: EOMI, PERRL, dry MM NECK: Supple, no JVD PULM: CTAB no c/w/r CV RRR no m/r/g ABD: soft, nontender, nondistended, NABS EXT: no LE edema NEURO: nonfocal   Imaging: Ct Head Wo Contrast  Result Date: 12/19/2015 CLINICAL DATA:  Lethargy EXAM: CT HEAD WITHOUT CONTRAST TECHNIQUE: Contiguous axial images were obtained from the base of the skull through the vertex without intravenous contrast. COMPARISON:  Head CT 07/26/2014 FINDINGS: Brain: No mass lesion, intraparenchymal hemorrhage or extra-axial collection. No evidence  of acute cortical infarct. Brain parenchyma and CSF-containing spaces are normal for age. Partially empty sella is again noted. Vascular: No hyperdense vessel or unexpected calcification. Skull: Normal visualized skull base, calvarium and extracranial soft tissues. Sinuses/Orbits: No sinus fluid levels or advanced mucosal thickening. No mastoid effusion. Normal orbits. IMPRESSION: Normal head CT for age. Electronically Signed   By: Deatra RobinsonKevin  Herman M.D.   On: 12/19/2015 22:50    Labs: BMET  Recent Labs Lab 12/17/15 1624 12/18/15 1047 12/19/15 0939 12/19/15 1855 12/20/15 0314 12/21/15 0344  NA 136 141 139 139 139 140  K 4.0 3.9 4.0 3.6 3.4* 3.7  CL 109 114* 113* 111 111 114*  CO2 19* 22 20* 21* 21* 20*  GLUCOSE 320* 178* 252* 208* 229* 173*  BUN 30* 25* 25* 24* 24* 23*  CREATININE 2.63* 2.18* 1.95* 1.90* 1.76* 1.55*  CALCIUM 9.4 9.4 9.1 8.9 8.6* 8.7*  PHOS  --   --   --   --  2.9  --    CBC  Recent Labs Lab 12/19/15 0513 12/19/15 1855 12/20/15 0513 12/21/15 0344  WBC 8.0 9.3 7.2 8.0  NEUTROABS 4.8 5.9  --   --   HGB 8.3* 7.9* 7.7* 7.3*  HCT 25.9* 24.8*  24.2* 22.9*  MCV 87.5 87.0 86.4 86.4  PLT 47* 54* 55* 72*    Medications:    . amLODipine  10 mg Oral Daily  . aspirin  81 mg Oral Daily  . doxazosin  4 mg Oral QHS  . ezetimibe  10 mg Oral QHS  . feeding supplement (PRO-STAT SUGAR FREE 64)  30 mL Oral BID  . Ferrous Fumarate  1 tablet Oral BID  . gabapentin  400 mg Oral TID  . hydrALAZINE  50 mg Oral TID  . insulin aspart  0-5 Units Subcutaneous QHS  . insulin aspart  0-9 Units Subcutaneous TID WC  . insulin glargine  14 Units Subcutaneous Q2200  . mometasone-formoterol  2 puff Inhalation BID  . pantoprazole  40 mg Oral Daily  . rosuvastatin  40 mg Oral QHS      Bufford ButtnerElizabeth Lakesha Levinson MD [facility] Cell [phone] pgr [phone] 12/21/2015, 2:32 PM

## 2015-12-21 NOTE — Progress Notes (Signed)
Pt had mult 2-3 sec burst SVT throughout day, pt asymptomatic. Heidi Brothershristy Monroe Qin RN

## 2015-12-21 NOTE — Progress Notes (Addendum)
Patient Name: Heidi Campbell Date of Encounter: 12/21/2015  Primary Cardiologist: Dr. Alanda SlimVaranasi  Hospital Problem List     Principal Problem:   NSTEMI (non-ST elevated myocardial infarction) [facility](HCC) Active Problems:   Type 2 diabetes mellitus treated with insulin (HCC)   Esophageal reflux   Hypertension   Coronary atherosclerosis of native coronary artery   Hyperlipidemia   CKD (chronic kidney disease), stage IV (HCC)   Atypical chest pain   Normocytic anemia   Thrombocytopenia (HCC)   Elevated troponin   Lethargy   V-tach (HCC)   HIT (heparin-induced thrombocytopenia) (HCC)     Subjective  Creatinine improving with hydration - near baseline. Platelet count up to 75K today. She remains on argatroban. No further chest pain.  Inpatient Medications    Scheduled Meds: . amLODipine  10 mg Oral Daily  . aspirin  81 mg Oral Daily  . doxazosin  4 mg Oral QHS  . ezetimibe  10 mg Oral QHS  . feeding supplement (PRO-STAT SUGAR FREE 64)  30 mL Oral BID  . Ferrous Fumarate  1 tablet Oral BID  . gabapentin  400 mg Oral TID  . hydrALAZINE  50 mg Oral TID  . insulin aspart  0-5 Units Subcutaneous QHS  . insulin aspart  0-9 Units Subcutaneous TID WC  . insulin glargine  14 Units Subcutaneous Q2200  . mometasone-formoterol  2 puff Inhalation BID  . pantoprazole  40 mg Oral Daily  . rosuvastatin  40 mg Oral QHS   Continuous Infusions: . sodium chloride 75 mL/hr at 12/21/15 0234  . argatroban 2.04 mcg/kg/min (12/21/15 0831)   PRN Meds: acetaminophen, ALPRAZolam, bisacodyl, fluticasone, morphine injection, nitroGLYCERIN, ondansetron (ZOFRAN) IV, ondansetron, polyethylene glycol, technetium TC 29M diethylenetriame-pentaacetic acid, zolpidem   Vital Signs    Vitals:   12/20/15 2012 12/20/15 2100 12/20/15 2153 12/21/15 0300  BP:  (!) 156/73 133/72 (!) 147/56  Pulse:  82 87 85  Resp:  15 14 15   Temp: 98.8 F (37.1 C)  98.5 F (36.9 C) 99.6 F (37.6 C)  TempSrc: Oral       SpO2:  100% 100% 99%  Weight:    224 lb 3.2 oz (101.7 kg)  Height:        Intake/Output Summary (Last 24 hours) at 12/21/15 0911 Last data filed at 12/21/15 0600  Gross per 24 hour  Intake           2261.2 ml  Output             1600 ml  Net            661.2 ml   Filed Weights   12/19/15 0352 12/19/15 2250 12/21/15 0300  Weight: 223 lb 4.8 oz (101.3 kg) 223 lb 5.2 oz (101.3 kg) 224 lb 3.2 oz (101.7 kg)    Physical Exam    General: resting in bed, no acute distress Cardiac: RRR, systolic murmur heard at upper sternal borders, DP pulses +2 b/l Pulm: clear to auscultation bilaterally, moving normal volumes of air Abd: soft, nondistended Ext: warm and well perfused, no pedal edema Neuro: alert and oriented X3   Labs    CBC  Recent Labs  12/19/15 0513 12/19/15 1855 12/20/15 0513 12/21/15 0344  WBC 8.0 9.3 7.2 8.0  NEUTROABS 4.8 5.9  --   --   HGB 8.3* 7.9* 7.7* 7.3*  HCT 25.9* 24.8* 24.2* 22.9*  MCV 87.5 87.0 86.4 86.4  PLT 47* 54* 55* 72*   Basic Metabolic  Panel  Recent Labs  12/20/15 0314 12/20/15 0801 12/21/15 0344  NA 139  --  140  K 3.4*  --  3.7  CL 111  --  114*  CO2 21*  --  20*  GLUCOSE 229*  --  173*  BUN 24*  --  23*  CREATININE 1.76*  --  1.55*  CALCIUM 8.6*  --  8.7*  MG  --  2.2 1.9  PHOS 2.9  --   --    Liver Function Tests  Recent Labs  12/18/15 1047 12/19/15 1855 12/20/15 0314  AST 74* 89*  --   ALT 59* 55*  --   ALKPHOS 73 62  --   BILITOT 0.5 0.5  --   PROT 6.3* 6.1*  --   ALBUMIN 2.8* 2.6* 2.4*   No results for input(s): LIPASE, AMYLASE in the last 72 hours. Cardiac Enzymes  Recent Labs  12/19/15 1855 12/20/15 0314 12/20/15 0800  TROPONINI 0.22* 0.15* 0.15*   BNP Invalid input(s): POCBNP D-Dimer No results for input(s): DDIMER in the last 72 hours. Hemoglobin A1C No results for input(s): HGBA1C in the last 72 hours. Fasting Lipid Panel No results for input(s): CHOL, HDL, LDLCALC, TRIG, CHOLHDL, LDLDIRECT in  the last 72 hours. Thyroid Function Tests  Recent Labs  12/19/15 1855  TSH 4.317    Telemetry    NSR, low voltage, few PVCs, short run sinus tach - Personally Reviewed  ECG    11/16: NSR, low voltage III and aVF, normal R-wave progression - Personally Reviewed  Radiology    Ct Head Wo Contrast  Result Date: 12/19/2015 CLINICAL DATA:  Lethargy EXAM: CT HEAD WITHOUT CONTRAST TECHNIQUE: Contiguous axial images were obtained from the base of the skull through the vertex without intravenous contrast. COMPARISON:  Head CT 07/26/2014 FINDINGS: Brain: No mass lesion, intraparenchymal hemorrhage or extra-axial collection. No evidence of acute cortical infarct. Brain parenchyma and CSF-containing spaces are normal for age. Partially empty sella is again noted. Vascular: No hyperdense vessel or unexpected calcification. Skull: Normal visualized skull base, calvarium and extracranial soft tissues. Sinuses/Orbits: No sinus fluid levels or advanced mucosal thickening. No mastoid effusion. Normal orbits. IMPRESSION: Normal head CT for age. Electronically Signed   By: Deatra Robinson M.D.   On: 12/19/2015 22:50   Ir Fluoro Guide Cv Line Right  Result Date: 12/19/2015 CLINICAL DATA:  Poor venous access.  Chronic kidney disease. EXAM: RIGHT IJ CENTRAL VENOUS CATHETER PLACEMENT UNDER ULTRASOUND AND FLUOROSCOPIC GUIDANCE FLUOROSCOPY TIME:  Less than 6 seconds TECHNIQUE: The procedure, risks (including but not limited to bleeding, infection, organ damage ), benefits, and alternatives were explained to the patient. Questions regarding the procedure were encouraged and answered. The patient understands and consents to the procedure. Patency of the right IJ vein was confirmed with ultrasound with image documentation. An appropriate skin site was determined. Skin site was marked. Region was prepped using maximum barrier technique including cap and mask, sterile gown, sterile gloves, large sterile sheet, and  Chlorhexidine as cutaneous antisepsis. The region was infiltrated locally with 1% lidocaine. Under real-time ultrasound guidance, the right IJ vein was accessed with a 21 gauge micropuncture needle; the needle tip within the vein was confirmed with ultrasound image documentation. The needle exchanged over a guidewire for peel-away sheath through which an 18cm 5 [place] PowerPICC dual lumen catheter was advanced. This was positioned with the tip at the cavoatrial junction. Spot chest radiograph shows good positioning and no pneumothorax. Catheter was flushed and sutured externally  with 0-Prolene sutures. Patient tolerated the procedure well. COMPLICATIONS: None immediate IMPRESSION: 1. Technically successful right IJ double lumen power injectable central venous catheter placement. Electronically Signed   By: Corlis Leak  Hassell M.D.   On: 12/19/2015 14:05   Ir Koreas Guide Vasc Access Right  Result Date: 12/19/2015 CLINICAL DATA:  Poor venous access.  Chronic kidney disease. EXAM: RIGHT IJ CENTRAL VENOUS CATHETER PLACEMENT UNDER ULTRASOUND AND FLUOROSCOPIC GUIDANCE FLUOROSCOPY TIME:  Less than 6 seconds TECHNIQUE: The procedure, risks (including but not limited to bleeding, infection, organ damage ), benefits, and alternatives were explained to the patient. Questions regarding the procedure were encouraged and answered. The patient understands and consents to the procedure. Patency of the right IJ vein was confirmed with ultrasound with image documentation. An appropriate skin site was determined. Skin site was marked. Region was prepped using maximum barrier technique including cap and mask, sterile gown, sterile gloves, large sterile sheet, and Chlorhexidine as cutaneous antisepsis. The region was infiltrated locally with 1% lidocaine. Under real-time ultrasound guidance, the right IJ vein was accessed with a 21 gauge micropuncture needle; the needle tip within the vein was confirmed with ultrasound image documentation. The  needle exchanged over a guidewire for peel-away sheath through which an 18cm 5 [place] PowerPICC dual lumen catheter was advanced. This was positioned with the tip at the cavoatrial junction. Spot chest radiograph shows good positioning and no pneumothorax. Catheter was flushed and sutured externally with 0-Prolene sutures. Patient tolerated the procedure well. COMPLICATIONS: None immediate IMPRESSION: 1. Technically successful right IJ double lumen power injectable central venous catheter placement. Electronically Signed   By: Corlis Leak  Hassell M.D.   On: 12/19/2015 14:05    Cardiac Studies   TTE 12/05/2015: - Left ventricle: Wall thickness was increased in a pattern of mild LVH. Systolic function was normal. The estimated ejection fraction was in the range of 60% to 65%. - Pulmonary arteries: PA peak pressure: 44 mm Hg (S).  Patient Profile     70 year old female with a history of CAD s/p LHC 2012 (50% proximal LAD plaque), HTN, IDDM, Asthma, GERD, L3-L4 spondylolisthesis s/p decompressive laminectomy/foraminotomies (11/07/2015), and recent ARF who presented to the ED with chest pain, SOB, and fatigue found to have NSTEMI.  Assessment & Plan    NSTEMI: Troponin peaked to 1.24 before trending down. EKG without acute ischemic changes.  Cath may not be feasible at this time given recent renal failure and thrombocytopenia. Off heparin with low platelets and confirmed HIT, now on Argatroban with pharmacy's assistance. -Continue Argatroban per pharmacy for HIT -Continue prn nitro -Continue ASA 81 mg daily -Monitor for renal recovery and improved platelet count  CAD: Known CAD s/p LHC in 09/2010 which was notable for moderate proximal LAD disease with 50% plaque and moderate diffuse disease in the remainder of the vessel. She was medically managed without percutaneous intervention. -Continue Aspirin 81 mg daily -Continue Crestor 40 mg qhs and Ezetimibe 10 mg qhs -Not on Beta Blocker, previously was  taking but d/c'ed for low HR -Given recent events, would not recommend cath at this time. Could consider outpatient stress test to evaluate for high risk ischemia.   ?Paroxysmal Atrial Fibrillation: CHADSVASc score is 5, 6.7% yearly stroke risk. Unclear if she was truly in atrial fibrillation that self-converted. Suspect this was ischemic related arrhythmia. We will hold off on long-term anticoagulation at this time. -Continue telemetry monitoring -given ?PAF and HIT- likely to transition argatroban to warfarin for at least 3-6 months.  Thrombocytopenia:  HIT positive -Continue Argatroban per pharmacy, platelets improving -monitor for signs/symptoms of bleeding -platelet count is improving -transition argatroban to warfarin once platelet count has normalized. Suggested duration of treatment is 3-6 months.  Renal Disease: Acute Renal Failure earlier this month s/p emergent HD on 11/3 for hyperkalemia thought secondary to ARB use and hypotension/volume depletion.  -Avoid nephrotoxic agents -appreciate renal recs, hydrating today, creatinine is near baseline  HTN: BP stable this morning, hypotensive overnight. -On amlodipine 10 mg daily, hydralazine 50 mg TID, dozaxosin 4 mg qhs  S/p L3/L4 decompressive laminectomy and foraminotomy: Patient with good strength in LE. WOC consulted for surgical site care.  T2DM: Hgb A1c 9.8 -SSI and Lantus per primary  Chrystie Nose, MD, [facility] Attending Cardiologist [facility]  Chrystie Nose, MD  12/21/2015, 9:11 AM

## 2015-12-21 NOTE — Progress Notes (Signed)
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, PAF   Labs:  Recent Labs  12/19/15 1855  12/20/15 0127 12/20/15 0314 12/20/15 0513 12/20/15 0800 12/21/15 0344  HGB 7.9*  --   --   --  7.7*  --  7.3*  HCT 24.8*  --   --   --  24.2*  --  22.9*  PLT 54*  --   --   --  55*  --  72*  APTT  --   < > 67*  --  68*  --  64*  CREATININE 1.90*  --   --  1.76*  --   --  1.55*  TROPONINI 0.22*  --   --  0.15*  --  0.15*  --   < > = values in this interval not displayed.   Assessment/Plan:  70yo female therapeutic on argatroban at current rate x2. Will continue gtt at current rate and continue monitoring daily. Patient with suspected HIT, SRA still pending (collected 11/17). PLTC now recovering but still low at 72. Hgb low with steady decline at 7.3. No bleeding noted.  APTT 64 today. Per Dr. Blanchie DessertHilty's note - Given ?PAF and HIT - likely transition argatroban to warfarin for at least 3-6 months.   Goal APTT = 50-90 seconds  Plan:  Continue argatroban at 2 mcg/kg/mL - peripheral line Daily APTTs Monitor s/sx bleeding F/u transition to PO anticoagulation, SRA results  Allena Katzaroline E Rajendra Spiller, Pharm.D. PGY1 Pharmacy Resident 11/19/201710:02 AM Pager [phone]

## 2015-12-22 LAB — CBC
HCT: 21.7 % — ABNORMAL LOW (ref 36.0–46.0)
Hemoglobin: 7.1 g/dL — ABNORMAL LOW (ref 12.0–15.0)
MCH: 28.2 pg (ref 26.0–34.0)
MCHC: 32.7 g/dL (ref 30.0–36.0)
MCV: 86.1 fL (ref 78.0–100.0)
PLATELETS: 91 10*3/uL — AB (ref 150–400)
RBC: 2.52 MIL/uL — ABNORMAL LOW (ref 3.87–5.11)
RDW: 13.5 % (ref 11.5–15.5)
WBC: 6.6 10*3/uL (ref 4.0–10.5)

## 2015-12-22 LAB — GLUCOSE, CAPILLARY
GLUCOSE-CAPILLARY: 127 mg/dL — AB (ref 65–99)
GLUCOSE-CAPILLARY: 156 mg/dL — AB (ref 65–99)
GLUCOSE-CAPILLARY: 158 mg/dL — AB (ref 65–99)
GLUCOSE-CAPILLARY: 192 mg/dL — AB (ref 65–99)

## 2015-12-22 LAB — PREPARE RBC (CROSSMATCH)

## 2015-12-22 LAB — RENAL FUNCTION PANEL
Albumin: 2.4 g/dL — ABNORMAL LOW (ref 3.5–5.0)
Anion gap: 6 (ref 5–15)
BUN: 18 mg/dL (ref 6–20)
CHLORIDE: 115 mmol/L — AB (ref 101–111)
CO2: 20 mmol/L — AB (ref 22–32)
CREATININE: 1.52 mg/dL — AB (ref 0.44–1.00)
Calcium: 8.7 mg/dL — ABNORMAL LOW (ref 8.9–10.3)
GFR calc Af Amer: 39 mL/min — ABNORMAL LOW (ref >60)
GFR, EST NON AFRICAN AMERICAN: 34 mL/min — AB (ref >60)
GLUCOSE: 135 mg/dL — AB (ref 65–99)
POTASSIUM: 3.6 mmol/L (ref 3.5–5.1)
Phosphorus: 2.2 mg/dL — ABNORMAL LOW (ref 2.5–4.6)
Sodium: 141 mmol/L (ref 135–145)

## 2015-12-22 LAB — APTT: aPTT: 57 seconds — ABNORMAL HIGH (ref 24–36)

## 2015-12-22 MED ORDER — SODIUM CHLORIDE 0.9 % IV SOLN
Freq: Once | INTRAVENOUS | Status: AC
  Administered 2015-12-22: 21:00:00 via INTRAVENOUS

## 2015-12-22 MED ORDER — FUROSEMIDE 10 MG/ML IJ SOLN
20.0000 mg | Freq: Once | INTRAMUSCULAR | Status: AC
  Administered 2015-12-22: 20 mg via INTRAVENOUS
  Filled 2015-12-22: qty 2

## 2015-12-22 MED ORDER — ACETAMINOPHEN 325 MG PO TABS
650.0000 mg | ORAL_TABLET | Freq: Once | ORAL | Status: AC
  Administered 2015-12-22: 650 mg via ORAL
  Filled 2015-12-22: qty 2

## 2015-12-22 MED ORDER — DIPHENHYDRAMINE HCL 25 MG PO CAPS
25.0000 mg | ORAL_CAPSULE | Freq: Once | ORAL | Status: AC
  Administered 2015-12-22: 25 mg via ORAL
  Filled 2015-12-22: qty 1

## 2015-12-22 NOTE — Progress Notes (Signed)
Physical Therapy Treatment Patient Details Name: Orson Aloevon V Bartko MRN: [id number] DOB: Aug 23, 1945 Today's Date: 12/22/2015    History of Present Illness 70 y.o. with a history of diabetes mellitus, hypertension, hyperlipidemia, CAD, laminectomy in oct 2017, wound infection and sepsis 11/11. Readmitted with NSTEMI    PT Comments    Pt required MAX encouragement to participate.  Increased time to sit EOB.  Attempted to apply back brace however way too small/unable to secure.  No c/o back pain.  Max c/o feeling "tired". Noted HgB 7.1    Assisted with amb a limited distance with RW and recliner following behind for safety.    Follow Up Recommendations  SNF     Equipment Recommendations  Rolling walker with 5" wheels    Recommendations for Other Services       Precautions / Restrictions Precautions Precautions: Back;Fall Precaution Comments: pt educated for back precautions Restrictions Other Position/Activity Restrictions: attempted to apply back brace however unable to fit properly    Mobility  Bed Mobility Overal bed mobility: Independent Bed Mobility: Rolling;Sidelying to Sit   Sidelying to sit: Min assist;Mod assist       General bed mobility comments: assist to transition to sit EOB plus increased time  Transfers Overall transfer level: Needs assistance Equipment used: Rolling walker (2 wheeled) Transfers: Sit to/from Stand Sit to Stand: Min assist         General transfer comment: cues for hand placement and posture  Ambulation/Gait Ambulation/Gait assistance: Min guard Ambulation Distance (Feet): 45 Feet Assistive device: Rolling walker (2 wheeled)   Gait velocity: decreased   General Gait Details: decreased amb distance due to increased c/o feel "tired".  Recliner following behind for safety.    Stairs            Wheelchair Mobility    Modified Rankin (Stroke Patients Only)       Balance                                     Cognition Arousal/Alertness: Awake/alert Behavior During Therapy: Flat affect Overall Cognitive Status: Within Functional Limits for tasks assessed                      Exercises      General Comments        Pertinent Vitals/Pain Pain Assessment: No/denies pain    Home Living                      Prior Function            PT Goals (current goals can now be found in the care plan section) Progress towards PT goals: Progressing toward goals    Frequency    Min 3X/week      PT Plan Current plan remains appropriate    Co-evaluation             End of Session Equipment Utilized During Treatment: Gait belt Activity Tolerance: Patient limited by fatigue Patient left: in chair;with call bell/phone within reach;with family/visitor present     Time: [id number] PT Time Calculation (min) (ACUTE ONLY): 22 min  Charges:  $Gait Training: 8-22 mins                    G Codes:      Felecia ShellingLori Duke Weisensel  PTA [facility]  Acute  Rehab Pager  [phone]  

## 2015-12-22 NOTE — Care Management Important Message (Signed)
Important Message  Patient Details  Name: Heidi Campbell MRN: [id number] Date of Birth: Aug 30, 1945   Medicare Important Message Given:  Yes    [name], [name] 12/22/2015, 10:43 AM

## 2015-12-22 NOTE — Progress Notes (Signed)
Patient Name: Heidi Campbell Date of Encounter: 12/22/2015  Primary Cardiologist: Dr. Alanda Slim Problem List     Principal Problem:   NSTEMI (non-ST elevated myocardial infarction) [facility]) Active Problems:   Type 2 diabetes mellitus treated with insulin (HCC)   Esophageal reflux   Hypertension   Coronary atherosclerosis of native coronary artery   Hyperlipidemia   CKD (chronic kidney disease), stage IV (HCC)   Atypical chest pain   Normocytic anemia   Thrombocytopenia (HCC)   Elevated troponin   Lethargy   HIT (heparin-induced thrombocytopenia) (HCC)     Subjective  Creatinine stable. Platelet count up to 91K today. She remains on argatroban. No further chest pain. She feels well this morning.  Inpatient Medications    Scheduled Meds: . amLODipine  10 mg Oral Daily  . aspirin  81 mg Oral Daily  . darbepoetin (ARANESP) injection - NON-DIALYSIS  40 mcg Subcutaneous Q Sun-1800  . doxazosin  4 mg Oral QHS  . ezetimibe  10 mg Oral QHS  . feeding supplement (PRO-STAT SUGAR FREE 64)  30 mL Oral BID  . gabapentin  400 mg Oral TID  . hydrALAZINE  50 mg Oral TID  . insulin aspart  0-5 Units Subcutaneous QHS  . insulin aspart  0-9 Units Subcutaneous TID WC  . insulin aspart  3 Units Subcutaneous TID WC  . insulin glargine  14 Units Subcutaneous Q2200  . mometasone-formoterol  2 puff Inhalation BID  . pantoprazole  40 mg Oral Daily  . rosuvastatin  40 mg Oral QHS   Continuous Infusions: . sodium chloride 75 mL/hr at 12/21/15 0234  . argatroban 2.04 mcg/kg/min (12/22/15 0045)   PRN Meds: acetaminophen, ALPRAZolam, bisacodyl, fluticasone, morphine injection, nitroGLYCERIN, ondansetron (ZOFRAN) IV, ondansetron, polyethylene glycol, technetium TC 16M diethylenetriame-pentaacetic acid, zolpidem   Vital Signs    Vitals:   12/21/15 0300 12/21/15 2100 12/22/15 0420 12/22/15 0901  BP: (!) 147/56 (!) 161/64 115/74   Pulse: 85 84 88   Resp: 15 12 15    Temp: 99.6 F  (37.6 C) 98.8 F (37.1 C) 98.7 F (37.1 C)   TempSrc:      SpO2: 99% 99% 98% 98%  Weight: 224 lb 3.2 oz (101.7 kg)  227 lb 4.8 oz (103.1 kg)   Height:        Intake/Output Summary (Last 24 hours) at 12/22/15 0939 Last data filed at 12/22/15 0756  Gross per 24 hour  Intake           1553.6 ml  Output             1225 ml  Net            328.6 ml   Filed Weights   12/19/15 2250 12/21/15 0300 12/22/15 0420  Weight: 223 lb 5.2 oz (101.3 kg) 224 lb 3.2 oz (101.7 kg) 227 lb 4.8 oz (103.1 kg)    Physical Exam    General: resting in bed, no acute distress Cardiac: RRR, systolic murmur heard at upper sternal borders, DP pulses +2 b/l Pulm: clear to auscultation bilaterally, moving normal volumes of air Abd: soft, nondistended Ext: warm and well perfused, no pedal edema Neuro: alert and oriented X3   Labs    CBC  Recent Labs  12/19/15 1855  12/21/15 0344 12/22/15 0632  WBC 9.3  < > 8.0 6.6  NEUTROABS 5.9  --   --   --   HGB 7.9*  < > 7.3* 7.1*  HCT 24.8*  < >  22.9* 21.7*  MCV 87.0  < > 86.4 86.1  PLT 54*  < > 72* 91*  < > = values in this interval not displayed. Basic Metabolic Panel  Recent Labs  12/20/15 0314 12/20/15 0801 12/21/15 0344 12/22/15 0632  NA 139  --  140 141  K 3.4*  --  3.7 3.6  CL 111  --  114* 115*  CO2 21*  --  20* 20*  GLUCOSE 229*  --  173* 135*  BUN 24*  --  23* 18  CREATININE 1.76*  --  1.55* 1.52*  CALCIUM 8.6*  --  8.7* 8.7*  MG  --  2.2 1.9  --   PHOS 2.9  --   --  2.2*   Liver Function Tests  Recent Labs  12/19/15 1855 12/20/15 0314 12/22/15 0632  AST 89*  --   --   ALT 55*  --   --   ALKPHOS 62  --   --   BILITOT 0.5  --   --   PROT 6.1*  --   --   ALBUMIN 2.6* 2.4* 2.4*   No results for input(s): LIPASE, AMYLASE in the last 72 hours. Cardiac Enzymes  Recent Labs  12/19/15 1855 12/20/15 0314 12/20/15 0800  TROPONINI 0.22* 0.15* 0.15*   BNP Invalid input(s): POCBNP D-Dimer No results for input(s): DDIMER in  the last 72 hours. Hemoglobin A1C No results for input(s): HGBA1C in the last 72 hours. Fasting Lipid Panel No results for input(s): CHOL, HDL, LDLCALC, TRIG, CHOLHDL, LDLDIRECT in the last 72 hours. Thyroid Function Tests  Recent Labs  12/19/15 1855  TSH 4.317    Telemetry    NSR, few PVCs, short runs of sinus tach - Personally Reviewed  ECG    11/16: NSR, low voltage III and aVF, normal R-wave progression - Personally Reviewed  Radiology    No results found.  Cardiac Studies   TTE 12/05/2015: - Left ventricle: Wall thickness was increased in a pattern of mild LVH. Systolic function was normal. The estimated ejection fraction was in the range of 60% to 65%. - Pulmonary arteries: PA peak pressure: 44 mm Hg (S).  Patient Profile     70 year old female with a history of CAD s/p LHC 2012 (50% proximal LAD plaque), HTN, IDDM, Asthma, GERD, L3-L4 spondylolisthesis s/p decompressive laminectomy/foraminotomies (11/07/2015), and recent ARF who presented to the ED with chest pain, SOB, and fatigue found to have NSTEMI.  Assessment & Plan    NSTEMI: Troponin peaked to 1.24 before trending down. EKG without acute ischemic changes.  Cath may not be feasible at this time given recent renal failure and thrombocytopenia. Off heparin with low platelets and confirmed HIT, now on Argatroban with pharmacy's assistance. -Continue Argatroban per pharmacy for HIT -Continue ASA 81 mg daily -Monitor for renal recovery and improved platelet count  CAD: Known CAD s/p LHC in 09/2010 which was notable for moderate proximal LAD disease with 50% plaque and moderate diffuse disease in the remainder of the vessel. She was medically managed without percutaneous intervention. -Continue Aspirin 81 mg daily -Continue Crestor 40 mg qhs and Ezetimibe 10 mg qhs -Not on Beta Blocker, previously was taking but d/c'ed for low HR -Given recent events, would not recommend cath at this time. Could consider  outpatient stress test to evaluate for high risk ischemia.   ?Paroxysmal Atrial Fibrillation: CHADSVASc score is 5, 6.7% yearly stroke risk. Unclear if she was truly in atrial fibrillation that self-converted. Suspect  this was ischemic related arrhythmia. -Continue telemetry monitoring -given ?PAF and HIT- likely to transition argatroban to warfarin for at least 3-6 months.  Thrombocytopenia: HIT positive, platelets improving -Continue Argatroban per pharmacy -monitor for signs/symptoms of bleeding -transition argatroban to warfarin once platelet count has normalized. Suggested duration of treatment is 3-6 months.  Renal Disease: Acute Renal Failure earlier this month s/p emergent HD on 11/3 for hyperkalemia thought secondary to ARB use and hypotension/volume depletion.  -Avoid nephrotoxic agents -appreciate renal recs  HTN: BP stable this morning, hypotensive overnight. -On amlodipine 10 mg daily, hydralazine 50 mg TID, dozaxosin 4 mg qhs  S/p L3/L4 decompressive laminectomy and foraminotomy: Patient with good strength in LE. WOC consulted for surgical site care.  T2DM: Hgb A1c 9.8 -SSI and Lantus per primary  Darreld McleanVishal Patel, MD  [facility] PGY-2 12/22/2015, 9:39 AM   History and all data above reviewed.  Patient examined.  I agree with the findings as above.  She is only complaining of leg pain.  No SOB or chest pain.  The patient exam reveals COR:RRR  ,  Lungs: Clear  ,  Abd: Positive bowel sounds, no rebound no guarding, Ext No edema   .  All available labs, radiology testing, previous records reviewed. Agree with documented assessment and plan. Plan  I have discussed with Dr. Rennis GoldenHilty.  Agree that she needs short term warfarin.  If there is no contraindication I would ask that the primary team start this today as getting this to a therapeutic level will be the rate limiting step in getting her home.  She will also need an out patient event monitor and Lexiscan Myoview.  Fayrene FearingJames [facility]rest [facility] Long[facility]wochrein   10:27 AM  12/22/2015

## 2015-12-22 NOTE — Progress Notes (Signed)
PROGRESS NOTE    Heidi Campbell  JYN:[id number] DOB: September 20, 1945 DOA: 12/17/2015 PCP: Astrid Divine, MD   Brief Narrative:  Heidi Campbell is a 70 y.o. female with medical history significant of hypertension, hyperlipidemia, diabetes mellitus, asthma, GERD, CAD, CKD-IV, arthritis, anemia, who presents with chest pain.  Patient states that her chest pain started this morning. It is located in the substernal area, constant, 7 out of 10 in severity, pressure-like pain. Nonradiating. It is not aggravated or alleviated by any known factors. It is associated with mild shortness of breath. No cough, fever or chills. She has left anterior leg pain, but no tenderness over calf areas. Patient was given aspirin and nitroglycerin. Her chest pain has subsided. Currently no chest pain and shortness of breath. Patient denies nausea, vomiting, abdominal, symptoms for UTI, diarrhea, unilateral weakness. Of note, pt was noted to have A fib with RVR by EMS, but no EKG recorded. Her EKG did not show A fib in ED.   Assessment & Plan:   Principal Problem:   NSTEMI (non-ST elevated myocardial infarction) (HCC) Active Problems:   Type 2 diabetes mellitus treated with insulin (HCC)   Esophageal reflux   Hypertension   Coronary atherosclerosis of native coronary artery   Hyperlipidemia   CKD (chronic kidney disease), stage IV (HCC)   Atypical chest pain   Normocytic anemia   Thrombocytopenia (HCC)   Elevated troponin   Lethargy   HIT (heparin-induced thrombocytopenia) (HCC)  #1 non-ST elevated MI Patient had presented with chest pain relieved with nitroglycerin. Troponin is trending down. Patient currently chest pain-free. Patient recently hospitalized for acute renal failure with critical hyperkalemia and creatinine up to 10 requiring emergent hemodialysis. Patient's renal function improving however has not normalized.. Patient also noted to have a thrombocytopenia with a platelet count of 51 which  had worsened and now improving and currently at 91.  Due to thrombocytopenia and current creatinine which has not yet normalized cardiology hesitant to perform cardiac catheterization on the patient. IV heparin has been changed to argatroban per cardiology. Continue low-dose aspirin at 81 mg daily, zetia, hydralazine, Crestor. Patient with recent 2-D echo done on 12/05/2015 with a EF of 60-65% with mild LVH. Repeat 2-D echo pending. Cardiology at this time are not recommended cardiac cath and recommended outpatient stress test to evaluate for high-risk ischemia.  Nephrology has been consulted and recommended continued IV fluids until renal function is close to baseline or if catheterization needed to be done earlier to continue with IV fluids and minimize contrast. Cardiology following and appreciate input and recommendations.  #2 nonsustained V. tach Patient noted to have several beats of nonsustained V. tach evening of 12/19/2015, with some associated lethargy. Patient was given magnesium 2 g IV 1 and moved to the ICU for closer monitoring. Cardiac enzymes which was cycled were elevated. Patient with no further arrhythmias noted in the ICU. Repeat magnesium is 2.2. Potassium is at 3.4 and will replete. Cardiology reviewed strips and feel likely more secondary to artifact. Cardiology following and appreciate input and recommendations.  #3 type 2 diabetes mellitus CBGs have ranged from 127-158. Hemoglobin A1c was 11.2 on 10/30/2015. Hemoglobin A1c 9.8. Increased Lantus 14 units daily. Continue coverage NovoLog 3 units daily with meals. Continue sliding scale insulin.  #4 recent acute renal failure Patient's last hospitalization from 12/05/2015-12/12/2015 was noted to be in acute renal failure with a creatinine as high as 10. Patient had to undergo emergent hemodialysis also secondary to severe hyperkalemia. Renal function  has been gradually improving however has not normalized yet. Creatinine currently  at 1.5 to from 1.55 from 1.76 from 1.95 from 2.18. No nephrotoxins. Gentle hydration. Patient presented with a non-STEMI patient likely needing cardiac catheterization to be done per cardiology was requesting nephrology consultation to optimize patient's renal function. Patient has been seen in consultation by nephrology who had initially recommended gentle hydration. IV fluids have been discontinued as cardiac catheterization has been canceled.  Per cardiology due to recent events at this time are not recommended cardiac catheterization and would consider outpatient stress test to evaluate for high-risk ischemia. Continue gentle hydration and monitor renal function.  Appreciate nephrology input and recommendations.  #5 gastroesophageal reflux disease PPI.  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 During last hospitalization patient's platelet count was normal. On admission platelet count of 51. Platelet count today at 72 from 55 from 47 from 44. IV heparin has been changed to argatroban.LDH slightly elevated at 361. Haptoglobin elevated at 523. Bilirubin was normal. No schistocytes on smears. Once INR is greater than 100,000 may consider starting patient on Coumadin with overlap with a Gastrografin for at least 5 days until INR is greater than 2.  #7 hypertension Continue Cardura, Norvasc, hydralazine.  #8 hyperlipidemia Continue Zetia and Crestor.  #9 normocytic anemia Hemoglobin has been trickling down and currently at 7.1. Due to recent non-STEMI will transfuse 2 units packed red blood cells. Follow H&H.   DVT prophylaxis: argatroban Code Status: Full Family Communication: Updated patient. No family at bedside. Disposition Plan: Skilled nursing facility when medically stable and INR therapeutic..   Consultants:   Cardiology: Dr. Rennis Golden 12/18/2015  Nephrology: Dr. Signe Colt 12/19/2015  PCCM: Dr. Zonia Kief 12/19/2015  Procedures:   Chest x-ray 12/17/2015  V/Q  scan 12/17/2015  CT head 12/19/2015  RIJ CVL under fluoro per interventional radiology Dr. Deanne Coffer 12/19/2015  Antimicrobials:   None   Subjective: Patient sitting. No chest pain. No shortness of breath. No arrhythmias noted overnight. No bleeding.   Objective: Vitals:   12/21/15 2100 12/22/15 0420 12/22/15 0901 12/22/15 1052  BP: (!) 161/64 115/74  (!) 148/61  Pulse: 84 88    Resp: 12 15    Temp: 98.8 F (37.1 C) 98.7 F (37.1 C)    TempSrc:      SpO2: 99% 98% 98%   Weight:  103.1 kg (227 lb 4.8 oz)    Height:        Intake/Output Summary (Last 24 hours) at 12/22/15 1304 Last data filed at 12/22/15 0756  Gross per 24 hour  Intake           1553.6 ml  Output              650 ml  Net            903.6 ml   Filed Weights   12/19/15 2250 12/21/15 0300 12/22/15 0420  Weight: 101.3 kg (223 lb 5.2 oz) 101.7 kg (224 lb 3.2 oz) 103.1 kg (227 lb 4.8 oz)    Examination:  General exam: Sitting up in chair. Respiratory system: Clear to auscultation Bilaterally. Respiratory effort normal. Cardiovascular system: S1 & S2 heard, RRR. No JVD, murmurs, rubs, gallops or clicks. No pedal edema. Gastrointestinal system: Abdomen is nondistended, soft and nontender. No organomegaly or masses felt. Normal bowel sounds heard. Central nervous system: Alert and oriented. No focal neurological deficits. Extremities: Symmetric 5 x 5 power. Skin: No rashes, lesions or ulcers Psychiatry: Judgement and insight appear normal.  Mood & affect appropriate.     Data Reviewed: I have personally reviewed following labs and imaging studies  CBC:  Recent Labs Lab 12/19/15 0513 12/19/15 1855 12/20/15 0513 12/21/15 0344 12/22/15 0632  WBC 8.0 9.3 7.2 8.0 6.6  NEUTROABS 4.8 5.9  --   --   --   HGB 8.3* 7.9* 7.7* 7.3* 7.1*  HCT 25.9* 24.8* 24.2* 22.9* 21.7*  MCV 87.5 87.0 86.4 86.4 86.1  PLT 47* 54* 55* 72* 91*   Basic Metabolic Panel:  Recent Labs Lab 12/19/15 0939 12/19/15 1855  12/20/15 0314 12/20/15 0801 12/21/15 0344 12/22/15 0632  NA 139 139 139  --  140 141  K 4.0 3.6 3.4*  --  3.7 3.6  CL 113* 111 111  --  114* 115*  CO2 20* 21* 21*  --  20* 20*  GLUCOSE 252* 208* 229*  --  173* 135*  BUN 25* 24* 24*  --  23* 18  CREATININE 1.95* 1.90* 1.76*  --  1.55* 1.52*  CALCIUM 9.1 8.9 8.6*  --  8.7* 8.7*  MG  --  1.8  --  2.2 1.9  --   PHOS  --   --  2.9  --   --  2.2*   GFR: Estimated Creatinine Clearance: 39.5 mL/min (by C-G formula based on SCr of 1.52 mg/dL (H)). Liver Function Tests:  Recent Labs Lab 12/18/15 1047 12/19/15 1855 12/20/15 0314 12/22/15 0632  AST 74* 89*  --   --   ALT 59* 55*  --   --   ALKPHOS 73 62  --   --   BILITOT 0.5 0.5  --   --   PROT 6.3* 6.1*  --   --   ALBUMIN 2.8* 2.6* 2.4* 2.4*   No results for input(s): LIPASE, AMYLASE in the last 168 hours.  Recent Labs Lab 12/19/15 1833  AMMONIA 16   Coagulation Profile:  Recent Labs Lab 12/17/15 2333  INR 1.32   Cardiac Enzymes:  Recent Labs Lab 12/18/15 0529 12/18/15 1047 12/19/15 1855 12/20/15 0314 12/20/15 0800  TROPONINI 0.89* 0.53* 0.22* 0.15* 0.15*   BNP (last 3 results) No results for input(s): PROBNP in the last 8760 hours. HbA1C: No results for input(s): HGBA1C in the last 72 hours. CBG:  Recent Labs Lab 12/21/15 1212 12/21/15 1734 12/21/15 2055 12/22/15 0735 12/22/15 1136  GLUCAP 226* 265* 283* 127* 156*   Lipid Profile: No results for input(s): CHOL, HDL, LDLCALC, TRIG, CHOLHDL, LDLDIRECT in the last 72 hours. Thyroid Function Tests:  Recent Labs  12/19/15 1855  TSH 4.317   Anemia Panel: No results for input(s): VITAMINB12, FOLATE, FERRITIN, TIBC, IRON, RETICCTPCT in the last 72 hours. Sepsis Labs:  Recent Labs Lab 12/19/15 2140  LATICACIDVEN 1.2    Recent Results (from the past 240 hour(s))  MRSA PCR Screening     Status: None   Collection Time: 12/18/15  1:24 AM  Result Value Ref Range Status   MRSA by PCR NEGATIVE  NEGATIVE Final    Comment:        The GeneXpert MRSA Assay (FDA approved for NASAL specimens only), is one component of a comprehensive MRSA colonization surveillance program. It is not intended to diagnose MRSA infection nor to guide or monitor treatment for MRSA infections.   Culture, Urine     Status: Abnormal   Collection Time: 12/19/15  8:01 PM  Result Value Ref Range Status   Specimen Description URINE, RANDOM  Final   Special  Requests NONE  Final   Culture MULTIPLE SPECIES PRESENT, SUGGEST RECOLLECTION (A)  Final   Report Status 12/21/2015 FINAL  Final  Culture, blood (routine x 2)     Status: None (Preliminary result)   Collection Time: 12/19/15  9:02 PM  Result Value Ref Range Status   Specimen Description BLOOD RIGHT HAND  Final   Special Requests IN PEDIATRIC BOTTLE 1CC  Final   Culture NO GROWTH 3 DAYS  Final   Report Status PENDING  Incomplete  Culture, blood (routine x 2)     Status: None (Preliminary result)   Collection Time: 12/19/15  9:09 PM  Result Value Ref Range Status   Specimen Description BLOOD LEFT HAND  Final   Special Requests IN PEDIATRIC BOTTLE 3CC  Final   Culture NO GROWTH 3 DAYS  Final   Report Status PENDING  Incomplete         Radiology Studies: No results found.      Scheduled Meds: . amLODipine  10 mg Oral Daily  . aspirin  81 mg Oral Daily  . darbepoetin (ARANESP) injection - NON-DIALYSIS  40 mcg Subcutaneous Q Sun-1800  . doxazosin  4 mg Oral QHS  . ezetimibe  10 mg Oral QHS  . feeding supplement (PRO-STAT SUGAR FREE 64)  30 mL Oral BID  . gabapentin  400 mg Oral TID  . hydrALAZINE  50 mg Oral TID  . insulin aspart  0-5 Units Subcutaneous QHS  . insulin aspart  0-9 Units Subcutaneous TID WC  . insulin aspart  3 Units Subcutaneous TID WC  . insulin glargine  14 Units Subcutaneous Q2200  . mometasone-formoterol  2 puff Inhalation BID  . pantoprazole  40 mg Oral Daily  . rosuvastatin  40 mg Oral QHS   Continuous  Infusions: . sodium chloride 75 mL/hr at 12/21/15 0234  . argatroban 2.04 mcg/kg/min (12/22/15 1050)     LOS: 5 days    Time spent: 35 mins    Christina Gintz, MD [facility] Pager [phone]  If 7PM-7AM, please contact night-coverage [email or web address] Password [email or web address] 12/22/2015, 1:04 PM

## 2015-12-22 NOTE — Progress Notes (Signed)
Assessment/ Plan:   1. Resolving AKI: required one session of dialysis 11/3 for hyperkalemia. Creatinine near baseline, apparently was 1.0before AKI. Cr 1.53 now 2. Volume overload.  Stop IVFs.  We will sign off.  Subjective: Interval History: Good UOP  Objective: Vital signs in last 24 hours: Temp:  [98.7 F (37.1 C)-98.8 F (37.1 C)] 98.7 F (37.1 C) (11/20 1400) Pulse Rate:  [84-88] 84 (11/20 1400) Resp:  [12-15] 15 (11/20 0420) BP: (115-161)/(53-74) 140/53 (11/20 1400) SpO2:  [98 %-100 %] 100 % (11/20 1400) Weight:  [103.1 kg (227 lb 4.8 oz)] 103.1 kg (227 lb 4.8 oz) (11/20 0420) Weight change: 1.406 kg (3 lb 1.6 oz)  Intake/Output from previous day: 11/19 0701 - 11/20 0700 In: 1553.6 [P.O.:420; I.V.:1133.6] Out: 1150 [Urine:1150] Intake/Output this shift: Total I/O In: -  Out: 625 [Urine:625]  General appearance: alert, cooperative and slowed mentation Back: symmetric, no curvature. ROM normal. No CVA tenderness. Resp: clear to auscultation bilaterally Cardio: regular rate and rhythm, S1, S2 normal, no murmur, click, rub or gallop Extremities: edema 1-2+ edema  Lab Results:  Recent Labs  12/21/15 0344 12/22/15 0632  WBC 8.0 6.6  HGB 7.3* 7.1*  HCT 22.9* 21.7*  PLT 72* 91*   BMET:  Recent Labs  12/21/15 0344 12/22/15 0632  NA 140 141  K 3.7 3.6  CL 114* 115*  CO2 20* 20*  GLUCOSE 173* 135*  BUN 23* 18  CREATININE 1.55* 1.52*  CALCIUM 8.7* 8.7*   No results for input(s): PTH in the last 72 hours. Iron Studies: No results for input(s): IRON, TIBC, TRANSFERRIN, FERRITIN in the last 72 hours. Studies/Results: No results found.  Scheduled: . amLODipine  10 mg Oral Daily  . aspirin  81 mg Oral Daily  . darbepoetin (ARANESP) injection - NON-DIALYSIS  40 mcg Subcutaneous Q Sun-1800  . doxazosin  4 mg Oral QHS  . ezetimibe  10 mg Oral QHS  . feeding supplement (PRO-STAT SUGAR FREE 64)  30 mL Oral BID  . gabapentin  400 mg Oral TID  .  hydrALAZINE  50 mg Oral TID  . insulin aspart  0-5 Units Subcutaneous QHS  . insulin aspart  0-9 Units Subcutaneous TID WC  . insulin aspart  3 Units Subcutaneous TID WC  . insulin glargine  14 Units Subcutaneous Q2200  . mometasone-formoterol  2 puff Inhalation BID  . pantoprazole  40 mg Oral Daily  . rosuvastatin  40 mg Oral QHS    LOS: 5 days   Shan Valdes C 12/22/2015,3:52 PM

## 2015-12-22 NOTE — Progress Notes (Signed)
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, PAF   Labs:  Recent Labs  12/19/15 1855  12/20/15 0314 12/20/15 0513 12/20/15 0800 12/21/15 0344 12/22/15 0632  HGB 7.9*  --   --  7.7*  --  7.3* 7.1*  HCT 24.8*  --   --  24.2*  --  22.9* 21.7*  PLT 54*  --   --  55*  --  72* 91*  APTT  --   < >  --  68*  --  64* 57*  CREATININE 1.90*  --  1.76*  --   --  1.55* 1.52*  TROPONINI 0.22*  --  0.15*  --  0.15*  --   --   < > = values in this interval not displayed.   Assessment/Plan:  70yo female therapeutic on argatroban for NSTEMI, ?PAF. Patient with suspected HIT, SRA still pending (collected 11/17). PLTC now recovering but still low at 91. Hgb low with steady decline at 7.3. No bleeding documented. Per Dr. Blanchie DessertHilty's note - likely transition argatroban to warfarin for at least 3-6 months. Note on transition - argatroban causes INR to be falsely elevated.   Goals of therapy: APTT = 50-90 seconds Monitor platelets by anticoagulation protocol:  yes  Plan:  Continue argatroban at 2 mcg/kg/min - running in peripheral line Daily APTT/CBC Monitor s/sx bleeding F/u transition to PO anticoagulation, SRA results   Babs BertinHaley Hortense Cantrall, PharmD, BCPS Clinical Pharmacist 12/22/2015 10:11 AM

## 2015-12-23 ENCOUNTER — Inpatient Hospital Stay (HOSPITAL_COMMUNITY): Payer: Medicare Other

## 2015-12-23 LAB — CBC
HEMATOCRIT: 32.2 % — AB (ref 36.0–46.0)
Hemoglobin: 10.6 g/dL — ABNORMAL LOW (ref 12.0–15.0)
MCH: 27.7 pg (ref 26.0–34.0)
MCHC: 32.9 g/dL (ref 30.0–36.0)
MCV: 84.1 fL (ref 78.0–100.0)
PLATELETS: 121 10*3/uL — AB (ref 150–400)
RBC: 3.83 MIL/uL — AB (ref 3.87–5.11)
RDW: 14.1 % (ref 11.5–15.5)
WBC: 9.3 10*3/uL (ref 4.0–10.5)

## 2015-12-23 LAB — BASIC METABOLIC PANEL
ANION GAP: 7 (ref 5–15)
BUN: 19 mg/dL (ref 6–20)
CALCIUM: 9.1 mg/dL (ref 8.9–10.3)
CO2: 22 mmol/L (ref 22–32)
Chloride: 112 mmol/L — ABNORMAL HIGH (ref 101–111)
Creatinine, Ser: 1.35 mg/dL — ABNORMAL HIGH (ref 0.44–1.00)
GFR, EST AFRICAN AMERICAN: 45 mL/min — AB (ref >60)
GFR, EST NON AFRICAN AMERICAN: 39 mL/min — AB (ref >60)
Glucose, Bld: 159 mg/dL — ABNORMAL HIGH (ref 65–99)
POTASSIUM: 3.2 mmol/L — AB (ref 3.5–5.1)
Sodium: 141 mmol/L (ref 135–145)

## 2015-12-23 LAB — GLUCOSE, CAPILLARY
GLUCOSE-CAPILLARY: 199 mg/dL — AB (ref 65–99)
GLUCOSE-CAPILLARY: 212 mg/dL — AB (ref 65–99)
Glucose-Capillary: 157 mg/dL — ABNORMAL HIGH (ref 65–99)
Glucose-Capillary: 179 mg/dL — ABNORMAL HIGH (ref 65–99)

## 2015-12-23 LAB — SEROTONIN RELEASE ASSAY (SRA)
SRA 100IU/mL UFH Ser-aCnc: <1 % (ref 0–20)
SRA, LOW DOSE HEPARIN: 79 % — AB (ref 0–20)

## 2015-12-23 LAB — APTT: APTT: 57 s — AB (ref 24–36)

## 2015-12-23 MED ORDER — POTASSIUM CHLORIDE CRYS ER 20 MEQ PO TBCR
40.0000 meq | EXTENDED_RELEASE_TABLET | ORAL | Status: AC
  Administered 2015-12-23 (×2): 40 meq via ORAL
  Filled 2015-12-23 (×2): qty 2

## 2015-12-23 MED ORDER — APIXABAN 5 MG PO TABS
5.0000 mg | ORAL_TABLET | Freq: Two times a day (BID) | ORAL | Status: DC
  Administered 2015-12-23 – 2015-12-28 (×10): 5 mg via ORAL
  Filled 2015-12-23 (×10): qty 1

## 2015-12-23 NOTE — Clinical Social Work Note (Signed)
Clinical Social Work Assessment  Patient Details  Name: Heidi Campbell MRN: [id number] Date of Birth: 04-07-1945  Date of referral:  12/23/15               Reason for consult:  Discharge Planning                Permission sought to share information with:  Facility Sport and exercise psychologist, Family Supports Permission granted to share information::  Yes, Verbal Permission Granted  Name::     Heidi Campbell  Agency::  [facility]  Relationship::  Son  Contact Information:  [phone]  Housing/Transportation Living arrangements for the past 2 months:  [place] of Information:  Patient, Medical Team Patient Interpreter Needed:  None Criminal Activity/Legal Involvement Pertinent to Current Situation/Hospitalization:  No - Comment as needed Significant Relationships:  Adult Children Lives with:  Facility Resident Do you feel safe going back to the place where you live?  Yes Need for family participation in patient care:  Yes (Comment)  Care giving concerns:  Patient is from [facility]. PT recommending SNF once medically stable for discharge.   Social Worker assessment / plan:  CSW met with patient. No supports at bedside. CSW introduced role and explained that discharge planning would be discussed. Patient confirmed that she came from [facility] and plans to return once medically stable. Patient was unable to tell CSW how long she had been at [facility] and was unable to say whether it was a short or long time. No further concerns. CSW encouraged patient to contact CSW as needed. CSW will continue to follow patient for support and facilitate discharge back to SNF once medically stable.  Employment status:  Retired Nurse, adult PT Recommendations:  [place] / Referral to community resources:  [place]  Patient/Family's Response to care:  Patient agreeable to return to SNF. Patient's family  supportive and involved in patient's care. Patient appreciated social work intervention.  Patient/Family's Understanding of and Emotional Response to Diagnosis, Current Treatment, and Prognosis:  Patient understands and is agreeable to discharge plan. Patient appears happy with hospital care.  Emotional Assessment Appearance:  Appears stated age Attitude/Demeanor/Rapport:  Other (Pleasant) Affect (typically observed):  Accepting, Appropriate, Calm, Pleasant Orientation:  Oriented to Self, Oriented to Place, Oriented to  Time, Oriented to Situation Alcohol / Substance use:  Never Used Psych involvement (Current and /or in the community):  No (Comment)  Discharge Needs  Concerns to be addressed:  Care Coordination Readmission within the last 30 days:  Yes Current discharge risk:  Dependent with Mobility Barriers to Discharge:  Continued Medical Work up   Candie Chroman, LCSW 12/23/2015, 3:05 PM

## 2015-12-23 NOTE — Progress Notes (Signed)
Patient Name: Heidi Campbell Date of Encounter: 12/23/2015  Primary Cardiologist: Dr. Alanda SlimVaranasi  Hospital Problem List     Principal Problem:   NSTEMI (non-ST elevated myocardial infarction) [facility](HCC) Active Problems:   Type 2 diabetes mellitus treated with insulin (HCC)   Esophageal reflux   Hypertension   Coronary atherosclerosis of native coronary artery   Hyperlipidemia   CKD (chronic kidney disease), stage IV (HCC)   Atypical chest pain   Normocytic anemia   Thrombocytopenia (HCC)   Elevated troponin   Lethargy   HIT (heparin-induced thrombocytopenia) (HCC)     Subjective   She feels well this morning. No chest pain, SOB, or obvious bleeding. Platelets improving and creatinine trending down.  Inpatient Medications    Scheduled Meds: . amLODipine  10 mg Oral Daily  . apixaban  5 mg Oral BID  . aspirin  81 mg Oral Daily  . darbepoetin (ARANESP) injection - NON-DIALYSIS  40 mcg Subcutaneous Q Sun-1800  . doxazosin  4 mg Oral QHS  . ezetimibe  10 mg Oral QHS  . feeding supplement (PRO-STAT SUGAR FREE 64)  30 mL Oral BID  . gabapentin  400 mg Oral TID  . hydrALAZINE  50 mg Oral TID  . insulin aspart  0-5 Units Subcutaneous QHS  . insulin aspart  0-9 Units Subcutaneous TID WC  . insulin aspart  3 Units Subcutaneous TID WC  . insulin glargine  14 Units Subcutaneous Q2200  . mometasone-formoterol  2 puff Inhalation BID  . pantoprazole  40 mg Oral Daily  . potassium chloride  40 mEq Oral Q4H  . rosuvastatin  40 mg Oral QHS   Continuous Infusions: . argatroban 2.04 mcg/kg/min (12/23/15 0843)   PRN Meds: acetaminophen, ALPRAZolam, bisacodyl, fluticasone, morphine injection, nitroGLYCERIN, ondansetron (ZOFRAN) IV, ondansetron, polyethylene glycol, technetium TC 67M diethylenetriame-pentaacetic acid, zolpidem   Vital Signs    Vitals:   12/23/15 0306 12/23/15 0607 12/23/15 0800 12/23/15 0905  BP: (!) 141/56 113/83 (!) 157/50   Pulse: 73 75    Resp: 13 12    Temp:  98.5 F (36.9 C) 99.1 F (37.3 C) 99.3 F (37.4 C)   TempSrc: Oral Oral Oral   SpO2: 95% 96%  99%  Weight:  230 lb (104.3 kg)    Height:        Intake/Output Summary (Last 24 hours) at 12/23/15 0955 Last data filed at 12/23/15 0843  Gross per 24 hour  Intake             2079 ml  Output             2750 ml  Net             -671 ml   Filed Weights   12/21/15 0300 12/22/15 0420 12/23/15 0607  Weight: 224 lb 3.2 oz (101.7 kg) 227 lb 4.8 oz (103.1 kg) 230 lb (104.3 kg)    Physical Exam    General: resting in bed, no acute distress Cardiac: RRR, systolic murmur heard at upper sternal borders, DP pulses +2 b/l Pulm: clear to auscultation bilaterally, moving normal volumes of air Abd: soft, nondistended Ext: warm and well perfused, no pedal edema Neuro: alert and oriented X3   Labs    CBC  Recent Labs  12/22/15 0632 12/23/15 0650  WBC 6.6 9.3  HGB 7.1* 10.6*  HCT 21.7* 32.2*  MCV 86.1 84.1  PLT 91* 121*   Basic Metabolic Panel  Recent Labs  12/21/15 0344 12/22/15 [id number] 12/23/15  0650  NA 140 141 141  K 3.7 3.6 3.2*  CL 114* 115* 112*  CO2 20* 20* 22  GLUCOSE 173* 135* 159*  BUN 23* 18 19  CREATININE 1.55* 1.52* 1.35*  CALCIUM 8.7* 8.7* 9.1  MG 1.9  --   --   PHOS  --  2.2*  --    Liver Function Tests  Recent Labs  12/22/15 0632  ALBUMIN 2.4*   No results for input(s): LIPASE, AMYLASE in the last 72 hours. Cardiac Enzymes No results for input(s): CKTOTAL, CKMB, CKMBINDEX, TROPONINI in the last 72 hours. BNP Invalid input(s): POCBNP D-Dimer No results for input(s): DDIMER in the last 72 hours. Hemoglobin A1C No results for input(s): HGBA1C in the last 72 hours. Fasting Lipid Panel No results for input(s): CHOL, HDL, LDLCALC, TRIG, CHOLHDL, LDLDIRECT in the last 72 hours. Thyroid Function Tests No results for input(s): TSH, T4TOTAL, T3FREE, THYROIDAB in the last 72 hours.  Invalid input(s): FREET3  Telemetry    NSR, short runs of sinus tach  - Personally Reviewed  ECG    11/16: NSR, low voltage III and aVF, normal R-wave progression - Personally Reviewed  Radiology    No results found.  Cardiac Studies   TTE 12/05/2015: - Left ventricle: Wall thickness was increased in a pattern of mild LVH. Systolic function was normal. The estimated ejection fraction was in the range of 60% to 65%. - Pulmonary arteries: PA peak pressure: 44 mm Hg (S).  Patient Profile     70 year old female with a history of CAD s/p LHC 2012 (50% proximal LAD plaque), HTN, IDDM, Asthma, GERD, L3-L4 spondylolisthesis s/p decompressive laminectomy/foraminotomies (11/07/2015), and recent ARF who presented to the ED with chest pain, SOB, and fatigue found to have NSTEMI.  Assessment & Plan    NSTEMI: Troponin peaked to 1.24 before trending down. EKG without acute ischemic changes. Off heparin with low platelets and confirmed HIT, now on Argatroban with pharmacy's assistance. Will defer cath at this time and plan for outpatient stress test. -On Argatroban per pharmacy for HIT; transition to oral anticoagulation now that platelets >100,000; may consider DOAC with improving renal function -Continue ASA 81 mg daily  CAD: Known CAD s/p LHC in 09/2010 which was notable for moderate proximal LAD disease with 50% plaque and moderate diffuse disease in the remainder of the vessel. She was medically managed without percutaneous intervention. -Continue Aspirin 81 mg daily -Continue Crestor 40 mg qhs and Ezetimibe 10 mg qhs -Not on Beta Blocker, previously was taking but d/c'ed for low HR -Plan for outpatient stress testing as above  Thrombocytopenia: HIT positive, platelets improved to 121k this morning. She denies any bleeding. -transition argatroban to oral anticoagulation, can consider DOAC with improved renal function  Normocytic Anemia: Received 2 units PRBCs 12/22/15, Hgb 10.6 this morning. No active bleeding.  Renal Disease: Acute Renal Failure  earlier this month s/p emergent HD on 11/3 for hyperkalemia thought secondary to ARB use and hypotension/volume depletion. Creatinine continues to trend down. -Avoid nephrotoxic agents  ?Paroxysmal Atrial Fibrillation: CHADSVASc score is 5, 6.7% yearly stroke risk. Unclear if she was truly in atrial fibrillation that self-converted. Suspect this was ischemic related arrhythmia. No Afib on telemetry. - Anticoagulation as above in setting of NSTEMI and HIT  HTN: BP stable this morning, hypotensive overnight. -On amlodipine 10 mg daily, hydralazine 50 mg TID, dozaxosin 4 mg qhs   Darreld McleanVishal Patel, MD  [facility] PGY-2 12/23/2015, 9:55 AM   History and all data  above reviewed.  Patient examined.  I agree with the findings as above.  No new cardiac complaints. The patient exam reveals COR:RRR  ,  Lungs: Clear  ,  Abd: Positive bowel sounds, no rebound no guarding, Ext No edema  .  All available labs, radiology testing, previous records reviewed. Agree with documented assessment and plan. Status post transfusion.  To start Eliquis which sounds reasonable.  No further change in therapy.   Fayrene Fearing [facility]  10:32 AM  12/23/2015

## 2015-12-23 NOTE — Clinical Social Work Note (Signed)
[facility] CSW called [facility] to find out how they bill for Xarelto and Eliquis. Admissions coordinator, Tresa EndoKelly, stated that the facility covers the bill for the medications and then Medicare reimburses them. Patient will have to pay for the medication on her own once she discharges home.  RNCM and charge nurse notified.  Charlynn CourtSarah Christan Ciccarelli, CSW [phone]

## 2015-12-23 NOTE — Progress Notes (Signed)
Occupational Therapy Treatment Patient Details Name: Heidi Campbell MRN: [id number] DOB: 1945/11/16 Today's Date: 12/23/2015    History of present illness 70 y.o. with a history of diabetes mellitus, hypertension, hyperlipidemia, CAD, laminectomy in oct 2017, wound infection and sepsis 11/11. Readmitted with NSTEMI   OT comments  Pt agreed to sit EOB but declined getting to chair at this time.    Follow Up Recommendations  SNF;Supervision/Assistance - 24 hour    Equipment Recommendations  Other (comment) (TBA at SNF)    Recommendations for Other Services      Precautions / Restrictions Precautions Precautions: Back;Fall Precaution Comments: pt educated for back precautions Restrictions Weight Bearing Restrictions: No Other Position/Activity Restrictions: attempted to apply back brace however unable to fit properly- brace seemed too small       Mobility Bed Mobility Overal bed mobility: Needs Assistance Bed Mobility: Rolling;Sidelying to Sit Rolling: Min assist Sidelying to sit: Mod assist       General bed mobility comments: assist to transition to sit EOB plus increased time  Transfers Overall transfer level: Needs assistance Equipment used: Rolling walker (2 wheeled) Transfers: Sit to/from Stand Sit to Stand: Mod assist         General transfer comment: cues for hand placement and posture        ADL Overall ADL's : Needs assistance/impaired     Grooming: Sitting;Set up;Wash/dry face;Wash/dry Lawyerhands                   Toilet Transfer: Moderate assistance;BSC;Stand-pivot Toilet Transfer Details (indicate cue type and reason): sit to stand EOB for changing wet pad   Toileting- Clothing Manipulation and Hygiene: Maximal assistance;Cueing for sequencing;Cueing for compensatory techniques;Cueing for safety;Sit to/from stand         General ADL Comments: Pt requries mod vc to follow back precautions      Vision                            Cognition   Behavior During Therapy: Flat affect Overall Cognitive Status: Within Functional Limits for tasks assessed                         Exercises     Shoulder Instructions       General Comments      Pertinent Vitals/ Pain       Pain Assessment: No/denies pain         Frequency  Min 2X/week        Progress Toward Goals  OT Goals(current goals can now be found in the care plan section)  Progress towards OT goals: Progressing toward goals     Plan Discharge plan remains appropriate    Co-evaluation                 End of Session Equipment Utilized During Treatment: Gait belt;Other (comment) (unable to get back brace to fit - notified RN)   Activity Tolerance Patient tolerated treatment well   Patient Left with call bell/phone within reach;in bed;with bed alarm set   Nurse Communication Mobility status;Precautions        Time: [id number] OT Time Calculation (min): 17 min  Charges: OT General Charges $OT Visit: 1 Procedure OT Treatments $Self Care/Home Management : 8-22 mins  Lael Wetherbee D 12/23/2015, 11:09 AM

## 2015-12-23 NOTE — Progress Notes (Signed)
PROGRESS NOTE    [name]  ZOX:[id number] DOB: 08/09/1945 DOA: 12/17/2015 PCP: Astrid Divine, MD   Brief Narrative:  Heidi Campbell is a 70 y.o. female with medical history significant of hypertension, hyperlipidemia, diabetes mellitus, asthma, GERD, CAD, CKD-IV, arthritis, anemia, who presents with chest pain.  Patient states that her chest pain started this morning. It is located in the substernal area, constant, 7 out of 10 in severity, pressure-like pain. Nonradiating. It is not aggravated or alleviated by any known factors. It is associated with mild shortness of breath. No cough, fever or chills. She has left anterior leg pain, but no tenderness over calf areas. Patient was given aspirin and nitroglycerin. Her chest pain has subsided. Currently no chest pain and shortness of breath. Patient denies nausea, vomiting, abdominal, symptoms for UTI, diarrhea, unilateral weakness. Of note, pt was noted to have A fib with RVR by EMS, but no EKG recorded. Her EKG did not show A fib in ED.   Assessment & Plan:   Principal Problem:   NSTEMI (non-ST elevated myocardial infarction) (HCC) Active Problems:   Type 2 diabetes mellitus treated with insulin (HCC)   Esophageal reflux   Hypertension   Coronary atherosclerosis of native coronary artery   Hyperlipidemia   Atypical chest pain   Normocytic anemia   Thrombocytopenia (HCC)   Elevated troponin   Lethargy   HIT (heparin-induced thrombocytopenia) (HCC)  #1 non-ST elevated MI Patient had presented with chest pain relieved with nitroglycerin. Troponin is trending down. Patient currently chest pain-free. Patient recently hospitalized for acute renal failure with critical hyperkalemia and creatinine up to 10 requiring emergent hemodialysis. Patient's renal function improving however has not normalized.. Patient also noted to have a thrombocytopenia with a platelet count of 51 which had worsened and now improving and currently at  121. Due to thrombocytopenia and current creatinine which has not yet normalized cardiology hesitant to perform cardiac catheterization on the patient. IV heparin has been changed to argatroban per cardiology. Continue low-dose aspirin at 81 mg daily, zetia, hydralazine, Crestor. Patient with recent 2-D echo done on 12/05/2015 with a EF of 60-65% with mild LVH. Repeat 2-D echo pending. Will start patient on eliquis.  Cardiology at this time are not recommended cardiac cath and recommended outpatient stress test to evaluate for high-risk ischemia. IV fluids have been discontinued. Renal function improving on a daily basis. Cardiology following and appreciate input and recommendations.  #2 nonsustained V. tach Patient noted to have several beats of nonsustained V. tach evening of 12/19/2015, with some associated lethargy. Patient was given magnesium 2 g IV 1 and moved to the ICU for closer monitoring. Cardiac enzymes which was cycled were elevated. Patient with no further arrhythmias noted in the ICU. Repeat magnesium is 2.2. Potassium is at 3.4 and will replete. Cardiology reviewed strips and feel likely more secondary to artifact. Cardiology following and appreciate input and recommendations.  #3 type 2 diabetes mellitus CBGs have ranged from 157-199. Hemoglobin A1c was 11.2 on 10/30/2015. Hemoglobin A1c 9.8. Increased Lantus 14 units daily. Continue coverage NovoLog 3 units daily with meals. Continue sliding scale insulin.  #4 recent acute renal failure Patient's last hospitalization from 12/05/2015-12/12/2015 was noted to be in acute renal failure with a creatinine as high as 10. Patient had to undergo emergent hemodialysis also secondary to severe hyperkalemia. Renal function has been gradually improving however has not normalized yet. Creatinine currently at  1.35 from 1.5 to from 1.55 from 1.76 from 1.95 from 2.18.  No nephrotoxins. Patient presented with a non-STEMI patient likely needing cardiac  catheterization to be done per cardiology was requesting nephrology consultation to optimize patient's renal function. Patient has been seen in consultation by nephrology who had initially recommended gentle hydration. IV fluids have been discontinued as cardiac catheterization has been canceled.  Per cardiology due to recent events at this time are not recommended cardiac catheterization and would consider outpatient stress test to evaluate for high-risk ischemia. Appreciate nephrology input and recommendations.  #5 gastroesophageal reflux disease PPI.  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 During last hospitalization patient's platelet count was normal. On admission platelet count of 51. Platelet count today at 121 from 72 from 55 from 47 from 44. IV heparin has been changed to argatroban.LDH slightly elevated at 361. Haptoglobin elevated at 523. Bilirubin was normal. No schistocytes on smears. Will transition patient to eliquis 5 mg twice daily as INR greater than 100,000 and follow closely. Patient will likely need to be treated for approximately 4-6 weeks. Discussed with hematology, Dr. Cyndie Chime.   #7 hypertension Continue Cardura, Norvasc, hydralazine.  #8 hyperlipidemia Continue Zetia and Crestor.  #9 normocytic anemia Hemoglobin has been trickling down and was at 7.1 on 12/22/2015. Due to recent non-STEMI patient was transfused 2 units packed red blood cells on 12/22/2015. Hemoglobin currently at 10.6. Follow H&H.   #10 left lower extremity pain Patient with a positive hit. Currently on anticoagulation. Will check lower extremity Dopplers as well as plain films of the left lower extremity to rule out DVT of fracture respectively.   DVT prophylaxis: argatroban Code Status: Full Family Communication: Updated patient. No family at bedside. Disposition Plan: Skilled nursing facility when medically stable and INR therapeutic..   Consultants:   Cardiology:  Dr. Rennis Golden 12/18/2015  Nephrology: Dr. Signe Colt 12/19/2015  PCCM: Dr. Zonia Kief 12/19/2015  Procedures:   Chest x-ray 12/17/2015  V/Q scan 12/17/2015  CT head 12/19/2015  RIJ CVL under fluoro per interventional radiology Dr. Deanne Coffer 12/19/2015  2 units packed red blood cells 12/22/2015  Antimicrobials:   None   Subjective: Patient laying in bed states she's feeling much better after blood transfusion. No chest pain. No shortness of breath. No arrhythmias noted. No bleeding. Patient complaining of left lower extremity pain. Patient also stated had a fall prior to admission.   Objective: Vitals:   12/23/15 0607 12/23/15 0800 12/23/15 0905 12/23/15 1500  BP: 113/83 (!) 157/50  (!) 160/48  Pulse: 75   81  Resp: 12   18  Temp: 99.1 F (37.3 C) 99.3 F (37.4 C)  98.9 F (37.2 C)  TempSrc: Oral Oral  Oral  SpO2: 96%  99% 100%  Weight: 104.3 kg (230 lb)     Height:        Intake/Output Summary (Last 24 hours) at 12/23/15 1647 Last data filed at 12/23/15 1107  Gross per 24 hour  Intake             1929 ml  Output             2000 ml  Net              -71 ml   Filed Weights   12/21/15 0300 12/22/15 0420 12/23/15 0607  Weight: 101.7 kg (224 lb 3.2 oz) 103.1 kg (227 lb 4.8 oz) 104.3 kg (230 lb)    Examination:  General exam: Sitting up in bed. Respiratory system: Clear to auscultation Bilaterally. Respiratory effort normal. Cardiovascular system: S1 & S2  heard, RRR. No JVD, murmurs, rubs, gallops or clicks. No pedal edema. Gastrointestinal system: Abdomen is nondistended, soft and nontender. No organomegaly or masses felt. Normal bowel sounds heard. Central nervous system: Alert and oriented. No focal neurological deficits. Extremities: Left lower extremity tender to palpation in the calf region and left knee.  Skin: No rashes, lesions or ulcers Psychiatry: Judgement and insight appear normal. Mood & affect appropriate.     Data Reviewed: I have personally reviewed  following labs and imaging studies  CBC:  Recent Labs Lab 12/19/15 0513 12/19/15 1855 12/20/15 0513 12/21/15 0344 12/22/15 0632 12/23/15 0650  WBC 8.0 9.3 7.2 8.0 6.6 9.3  NEUTROABS 4.8 5.9  --   --   --   --   HGB 8.3* 7.9* 7.7* 7.3* 7.1* 10.6*  HCT 25.9* 24.8* 24.2* 22.9* 21.7* 32.2*  MCV 87.5 87.0 86.4 86.4 86.1 84.1  PLT 47* 54* 55* 72* 91* 121*   Basic Metabolic Panel:  Recent Labs Lab 12/19/15 1855 12/20/15 0314 12/20/15 0801 12/21/15 0344 12/22/15 0632 12/23/15 0650  NA 139 139  --  140 141 141  K 3.6 3.4*  --  3.7 3.6 3.2*  CL 111 111  --  114* 115* 112*  CO2 21* 21*  --  20* 20* 22  GLUCOSE 208* 229*  --  173* 135* 159*  BUN 24* 24*  --  23* 18 19  CREATININE 1.90* 1.76*  --  1.55* 1.52* 1.35*  CALCIUM 8.9 8.6*  --  8.7* 8.7* 9.1  MG 1.8  --  2.2 1.9  --   --   PHOS  --  2.9  --   --  2.2*  --    GFR: Estimated Creatinine Clearance: 44.8 mL/min (by C-G formula based on SCr of 1.35 mg/dL (H)). Liver Function Tests:  Recent Labs Lab 12/18/15 1047 12/19/15 1855 12/20/15 0314 12/22/15 0632  AST 74* 89*  --   --   ALT 59* 55*  --   --   ALKPHOS 73 62  --   --   BILITOT 0.5 0.5  --   --   PROT 6.3* 6.1*  --   --   ALBUMIN 2.8* 2.6* 2.4* 2.4*   No results for input(s): LIPASE, AMYLASE in the last 168 hours.  Recent Labs Lab 12/19/15 1833  AMMONIA 16   Coagulation Profile:  Recent Labs Lab 12/17/15 2333  INR 1.32   Cardiac Enzymes:  Recent Labs Lab 12/18/15 0529 12/18/15 1047 12/19/15 1855 12/20/15 0314 12/20/15 0800  TROPONINI 0.89* 0.53* 0.22* 0.15* 0.15*   BNP (last 3 results) No results for input(s): PROBNP in the last 8760 hours. HbA1C: No results for input(s): HGBA1C in the last 72 hours. CBG:  Recent Labs Lab 12/22/15 1619 12/22/15 2050 12/23/15 0730 12/23/15 1137 12/23/15 1646  GLUCAP 158* 192* 157* 199* 179*   Lipid Profile: No results for input(s): CHOL, HDL, LDLCALC, TRIG, CHOLHDL, LDLDIRECT in the last 72  hours. Thyroid Function Tests: No results for input(s): TSH, T4TOTAL, FREET4, T3FREE, THYROIDAB in the last 72 hours. Anemia Panel: No results for input(s): VITAMINB12, FOLATE, FERRITIN, TIBC, IRON, RETICCTPCT in the last 72 hours. Sepsis Labs:  Recent Labs Lab 12/19/15 2140  LATICACIDVEN 1.2    Recent Results (from the past 240 hour(s))  MRSA PCR Screening     Status: None   Collection Time: 12/18/15  1:24 AM  Result Value Ref Range Status   MRSA by PCR NEGATIVE NEGATIVE Final    Comment:  The GeneXpert MRSA Assay (FDA approved for NASAL specimens only), is one component of a comprehensive MRSA colonization surveillance program. It is not intended to diagnose MRSA infection nor to guide or monitor treatment for MRSA infections.   Culture, Urine     Status: Abnormal   Collection Time: 12/19/15  8:01 PM  Result Value Ref Range Status   Specimen Description URINE, RANDOM  Final   Special Requests NONE  Final   Culture MULTIPLE SPECIES PRESENT, SUGGEST RECOLLECTION (A)  Final   Report Status 12/21/2015 FINAL  Final  Culture, blood (routine x 2)     Status: None (Preliminary result)   Collection Time: 12/19/15  9:02 PM  Result Value Ref Range Status   Specimen Description BLOOD RIGHT HAND  Final   Special Requests IN PEDIATRIC BOTTLE 1CC  Final   Culture NO GROWTH 4 DAYS  Final   Report Status PENDING  Incomplete  Culture, blood (routine x 2)     Status: None (Preliminary result)   Collection Time: 12/19/15  9:09 PM  Result Value Ref Range Status   Specimen Description BLOOD LEFT HAND  Final   Special Requests IN PEDIATRIC BOTTLE 3CC  Final   Culture NO GROWTH 4 DAYS  Final   Report Status PENDING  Incomplete         Radiology Studies: Dg Tibia/fibula Left  Result Date: 12/23/2015 CLINICAL DATA:  Recent fall with left lower extremity pain, initial encounter EXAM: LEFT TIBIA AND FIBULA - 2 VIEW COMPARISON:  None. FINDINGS: Degenerative changes about the  knee joint are noted. No acute fracture or dislocation is seen. No gross soft tissue abnormality is noted. IMPRESSION: No acute abnormality noted. Electronically Signed   By: Alcide Clever M.D.   On: 12/23/2015 16:07   Dg Knee Complete 4 Views Left  Result Date: 12/23/2015 CLINICAL DATA:  Knee pain following recent fall, initial encounter EXAM: LEFT KNEE - COMPLETE 4+ VIEW COMPARISON:  11/24/2015 FINDINGS: Tricompartmental degenerative changes are noted most marked in the medial joint space stable from the prior exam. No joint effusion is seen. No acute fracture or dislocation is noted. IMPRESSION: Degenerative change without acute abnormality. Electronically Signed   By: Alcide Clever M.D.   On: 12/23/2015 16:13   Dg Femur Min 2 Views Left  Result Date: 12/23/2015 CLINICAL DATA:  Recent fall with left leg pain, initial encounter EXAM: LEFT FEMUR 2 VIEWS COMPARISON:  None. FINDINGS: No acute fracture or dislocation is noted. Degenerative changes are noted about the knee joint. No soft tissue abnormality is seen. IMPRESSION: No acute abnormality noted. Electronically Signed   By: Alcide Clever M.D.   On: 12/23/2015 16:07        Scheduled Meds: . amLODipine  10 mg Oral Daily  . apixaban  5 mg Oral BID  . aspirin  81 mg Oral Daily  . darbepoetin (ARANESP) injection - NON-DIALYSIS  40 mcg Subcutaneous Q Sun-1800  . doxazosin  4 mg Oral QHS  . ezetimibe  10 mg Oral QHS  . feeding supplement (PRO-STAT SUGAR FREE 64)  30 mL Oral BID  . gabapentin  400 mg Oral TID  . hydrALAZINE  50 mg Oral TID  . insulin aspart  0-5 Units Subcutaneous QHS  . insulin aspart  0-9 Units Subcutaneous TID WC  . insulin aspart  3 Units Subcutaneous TID WC  . insulin glargine  14 Units Subcutaneous Q2200  . mometasone-formoterol  2 puff Inhalation BID  . pantoprazole  40 mg  Oral Daily  . rosuvastatin  40 mg Oral QHS   Continuous Infusions:    LOS: 6 days    Time spent: 35 mins    Vernon Ariel,  MD [facility] Pager [phone]  If 7PM-7AM, please contact night-coverage [email or web address] Password [email or web address] 12/23/2015, 4:47 PM

## 2015-12-23 NOTE — NC FL2 (Signed)
[place] MEDICAID FL2 LEVEL OF CARE SCREENING TOOL     IDENTIFICATION  Patient Name: Heidi Campbell Birthdate: [birth year] Sex: female Admission Date (Current Location): 12/17/2015  [facility] and [place] Number:  Producer, television/film/video and Address:  [facility] [place], [address] [place], [place] [address]      Provider Number: [id number]  Attending Physician Name and Address:  Rodolph Bong, MD  Relative Name and Phone Number:       Current Level of Care: Hospital Recommended Level of Care: Skilled Nursing Facility Prior Approval Number:    Date Approved/Denied:   PASRR Number: [id number]  Discharge Plan: SNF    Current Diagnoses: Patient Active Problem List   Diagnosis Date Noted  . HIT (heparin-induced thrombocytopenia) (HCC)   . Elevated troponin   . Lethargy   . CKD (chronic kidney disease), stage IV (HCC) 12/17/2015  . NSTEMI (non-ST elevated myocardial infarction) (HCC) 12/17/2015  . Atypical chest pain 12/17/2015  . Normocytic anemia 12/17/2015  . Thrombocytopenia (HCC) 12/17/2015  . Somnolence   . Acute renal failure (HCC) 12/05/2015  . Pressure injury of skin 12/05/2015  . Degenerative spondylolisthesis 11/07/2015  . Carotid artery disease (HCC) 06/20/2014  . Bradycardia 10/12/2013  . Fatigue 10/12/2013  . Hyperlipidemia 05/14/2013  . Type II or unspecified type diabetes mellitus without mention of complication, uncontrolled 02/19/2013  . Coronary atherosclerosis of native coronary artery 12/22/2012  . Obesity 12/22/2012  . Hypertension   . SINUSITIS, ACUTE 04/01/2008  . Type 2 diabetes mellitus treated with insulin (HCC) 02/23/2007  . ALLERGIC RHINITIS 02/23/2007  . ASTHMA 02/23/2007  . Esophageal reflux 02/23/2007    Orientation RESPIRATION BLADDER Height & Weight     Self, Time, Situation, Place  Normal Continent Weight: 230 lb (104.3 kg) Height:  5\' 3"  (160 cm)  BEHAVIORAL SYMPTOMS/MOOD NEUROLOGICAL BOWEL NUTRITION  STATUS   (None)   Continent Diet (Heart healthy/carb modified)  AMBULATORY STATUS COMMUNICATION OF NEEDS Skin   Limited Assist Verbally Other (Comment), PU Stage and Appropriate Care, Surgical wounds (MASD, Skin tear)   PU Stage 2 Dressing: BID (Moist to dry)                   Personal Care Assistance Level of Assistance  Bathing, Feeding, Dressing Bathing Assistance: Maximum assistance Feeding assistance: Independent Dressing Assistance: Maximum assistance     Functional Limitations Info  Sight, Hearing, Speech Sight Info: Adequate Hearing Info: Adequate Speech Info: Adequate    SPECIAL CARE FACTORS FREQUENCY  PT (By licensed PT), OT (By licensed OT), Blood pressure, Diabetic urine testing     PT Frequency: 5 x week OT Frequency: 5 x week            Contractures Contractures Info: Not present    Additional Factors Info  Code Status, Allergies Code Status Info: Full Allergies Info: Heparin, Ace Inhibitors, Ampicillin, Cefazolin, Cefepime, Ceftazidime, Ceftriaxone, Ciprofloxacin, Contrast Media Iodinated Diagnostic Agents, Ertapenem, Gentamicin, Imipenem, Latex, Levofloxacin, Nitrofuran Derivatives, Shellfish Allergy, Tobramycin, Zosyn Piperacillin Sod-tazobactam So           Current Medications (12/23/2015):  This is the current hospital active medication list Current Facility-Administered Medications  Medication Dose Route Frequency Provider Last Rate Last Dose  . acetaminophen (TYLENOL) tablet 650 mg  650 mg Oral Q6H PRN Lorretta Harp, MD   650 mg at 12/19/15 1928  . ALPRAZolam Prudy Feeler) tablet 0.25 mg  0.25 mg Oral BID PRN Lorretta Harp, MD   0.25 mg at  12/18/15 1946  . amLODipine (NORVASC) tablet 10 mg  10 mg Oral Daily Lorretta HarpXilin Niu, MD   10 mg at 12/23/15 0810  . apixaban (ELIQUIS) tablet 5 mg  5 mg Oral BID Rodolph Bonganiel V Thompson, MD   5 mg at 12/23/15 1157  . aspirin chewable tablet 81 mg  81 mg Oral Daily Chrystie NoseKenneth C Hilty, MD   81 mg at 12/23/15 0810  . bisacodyl (DULCOLAX)  suppository 10 mg  10 mg Rectal Q4H PRN Lorretta HarpXilin Niu, MD      . Darbepoetin Alfa (ARANESP) injection 40 mcg  40 mcg Subcutaneous Q Sun-1800 Bufford ButtnerElizabeth Upton, MD   40 mcg at 12/22/15 0003  . doxazosin (CARDURA) tablet 4 mg  4 mg Oral QHS Lorretta HarpXilin Niu, MD   4 mg at 12/22/15 2258  . ezetimibe (ZETIA) tablet 10 mg  10 mg Oral QHS Lorretta HarpXilin Niu, MD   10 mg at 12/22/15 2259  . feeding supplement (PRO-STAT SUGAR FREE 64) liquid 30 mL  30 mL Oral BID Lorretta HarpXilin Niu, MD   30 mL at 12/23/15 0810  . fluticasone (FLONASE) 50 MCG/ACT nasal spray 1 spray  1 spray Each Nare Daily PRN Lorretta HarpXilin Niu, MD      . gabapentin (NEURONTIN) capsule 400 mg  400 mg Oral TID Lorretta HarpXilin Niu, MD   400 mg at 12/23/15 0810  . hydrALAZINE (APRESOLINE) tablet 50 mg  50 mg Oral TID Lorretta HarpXilin Niu, MD   50 mg at 12/23/15 0810  . insulin aspart (novoLOG) injection 0-5 Units  0-5 Units Subcutaneous QHS Rodolph Bonganiel V Thompson, MD   3 Units at 12/22/15 0003  . insulin aspart (novoLOG) injection 0-9 Units  0-9 Units Subcutaneous TID WC Rodolph Bonganiel V Thompson, MD   2 Units at 12/23/15 1158  . insulin aspart (novoLOG) injection 3 Units  3 Units Subcutaneous TID WC Rodolph Bonganiel V Thompson, MD   3 Units at 12/22/15 1725  . insulin glargine (LANTUS) injection 14 Units  14 Units Subcutaneous Q2200 Rodolph Bonganiel V Thompson, MD   14 Units at 12/22/15 2301  . mometasone-formoterol (DULERA) 100-5 MCG/ACT inhaler 2 puff  2 puff Inhalation BID Lorretta HarpXilin Niu, MD   2 puff at 12/23/15 0904  . morphine 4 MG/ML injection 2 mg  2 mg Intravenous Q4H PRN Lorretta HarpXilin Niu, MD   2 mg at 12/21/15 1411  . nitroGLYCERIN (NITROSTAT) SL tablet 0.4 mg  0.4 mg Sublingual Q5 min PRN Lorretta HarpXilin Niu, MD      . ondansetron [facility](ZOFRAN) injection 4 mg  4 mg Intravenous Q6H PRN Lorretta HarpXilin Niu, MD      . ondansetron (ZOFRAN-ODT) disintegrating tablet 4 mg  4 mg Oral Q8H PRN Lorretta HarpXilin Niu, MD      . pantoprazole (PROTONIX) EC tablet 40 mg  40 mg Oral Daily Lorretta HarpXilin Niu, MD   40 mg at 12/23/15 0809  . polyethylene glycol (MIRALAX / GLYCOLAX) packet 17 g  17 g  Oral Daily PRN Lorretta HarpXilin Niu, MD      . rosuvastatin (CRESTOR) tablet 40 mg  40 mg Oral QHS Lorretta HarpXilin Niu, MD   40 mg at 12/22/15 2258  . technetium TC 60M diethylenetriame-pentaacetic acid (DTPA) injection 32.9 millicurie  32.9 millicurie Intravenous Once PRN Francene BoyersJames Maxwell, MD      . zolpidem (AMBIEN) tablet 5 mg  5 mg Oral QHS PRN Lorretta HarpXilin Niu, MD         Discharge Medications: Please see discharge summary for a list of discharge medications.  Relevant Imaging Results:  Relevant Lab Results:  Additional Information SS#: [id number]  Margarito LinerSarah C Seona Clemenson, LCSW

## 2015-12-23 NOTE — Care Management Note (Addendum)
Case Management Note  Patient Details  Name: Heidi Campbell MRN: [id number] Date of Birth: [birth year]  Subjective/Objective:   Pt presented for Nstemi, Increased Cr and Thrombocytopenia. HIT confirmed and pt placed on IV Argatroban. [facility] CSW is aware.                  Action/Plan: CM will continue to monitor for additional needs. CM did place a benefits check for Eliquis. Will make pt aware once completed. No further needs at this time.   Expected Discharge Date:                  Expected Discharge Plan:  Skilled Nursing Facility  In-House Referral:  Clinical Social Work  Discharge planning Services  CM Consult  Post Acute Care Choice:    Choice offered to:     DME Arranged:    DME Agency:     HH Arranged:    HH Agency:     Status of Service:  In process, will continue to follow  If discussed at Long Length of Stay Meetings, dates discussed:    Additional Comments: Benefits check completed for Eliquis:  S/W DINA  @ [employer] # [id number]   ELIQUIS 2.5 MG BID   COVER- YES  CO-PAY- 100 % OF COAST  TIER- 2 DRUG  PRIOR APPROVAL -YES # [id number]  PHARMACY : [facility]   ELIQUIS 5 MG BID  SAME AS ABOVE  PRIOR APPROVAL- YES # [id number]   PATIENT ALSO IN COVERAGE GAP - Same process for Xarelto pt would have to cover cost at 100%. CM did call the CSW to see if SNF covers the cost vs if patient has to cover the cost. CSW to Paediatric nursecontact Charge RN or Floor Pharmacist. CM to provide 30 day free card to patient. No further needs at this time.   Gala LewandowskyGraves-Bigelow, Makail Watling Kaye, RN 12/23/2015, 1:22 PM

## 2015-12-23 NOTE — Progress Notes (Signed)
ANTICOAGULATION CONSULT NOTE - Follow Up Consult  Pharmacy Consult for argatroban>apixaban Indication: NSTEMI w/ HIT, PAF   Labs:  Recent Labs  12/21/15 0344 12/22/15 0632 12/23/15 0650  HGB 7.3* 7.1* 10.6*  HCT 22.9* 21.7* 32.2*  PLT 72* 91* 121*  APTT 64* 57* 57*  CREATININE 1.55* 1.52* 1.35*     Assessment/Plan:  70yo female therapeutic on argatroban for NSTEMI, ?PAF. Patient with confirmed HIT, SRA positive. PLTC now recovering to 121. Hgb up to 10.6 s/p 2u PRBC on 11/20. No bleeding documented. Patient continuing on therapeutic argatroban - Pharmacy consulted to transition to apixaban per discussion with Dr. Cyndie ChimeGranfortuna. Renal function improved, SCr down to 1.35, CrCl~45. Weight ~104 kg.  Goals of therapy: Stroke prevention Monitor platelets by anticoagulation protocol:  yes  Plan:  Transition argatroban to apixaban 5mg  PO BID - communicated with RN to turn off drip at time of 1st dose Monitor CBC, s/sx bleeding   Babs BertinHaley Tarick Parenteau, PharmD, BCPS Clinical Pharmacist 12/23/2015 10:13 AM

## 2015-12-24 ENCOUNTER — Inpatient Hospital Stay (HOSPITAL_COMMUNITY): Payer: Medicare Other

## 2015-12-24 DIAGNOSIS — M79609 Pain in unspecified limb: Secondary | ICD-10-CM

## 2015-12-24 LAB — TYPE AND SCREEN
ABO/RH(D): AB POS
ANTIBODY SCREEN: NEGATIVE
Unit division: 0
Unit division: 0

## 2015-12-24 LAB — CULTURE, BLOOD (ROUTINE X 2)
Culture: NO GROWTH
Culture: NO GROWTH

## 2015-12-24 LAB — CBC
HCT: 33 % — ABNORMAL LOW (ref 36.0–46.0)
Hemoglobin: 10.7 g/dL — ABNORMAL LOW (ref 12.0–15.0)
MCH: 27.4 pg (ref 26.0–34.0)
MCHC: 32.4 g/dL (ref 30.0–36.0)
MCV: 84.6 fL (ref 78.0–100.0)
PLATELETS: 154 10*3/uL (ref 150–400)
RBC: 3.9 MIL/uL (ref 3.87–5.11)
RDW: 14.7 % (ref 11.5–15.5)
WBC: 10.6 10*3/uL — AB (ref 4.0–10.5)

## 2015-12-24 LAB — RENAL FUNCTION PANEL
ALBUMIN: 2.5 g/dL — AB (ref 3.5–5.0)
Anion gap: 6 (ref 5–15)
BUN: 21 mg/dL — AB (ref 6–20)
CALCIUM: 9.2 mg/dL (ref 8.9–10.3)
CO2: 23 mmol/L (ref 22–32)
CREATININE: 1.4 mg/dL — AB (ref 0.44–1.00)
Chloride: 112 mmol/L — ABNORMAL HIGH (ref 101–111)
GFR, EST AFRICAN AMERICAN: 43 mL/min — AB (ref >60)
GFR, EST NON AFRICAN AMERICAN: 37 mL/min — AB (ref >60)
Glucose, Bld: 160 mg/dL — ABNORMAL HIGH (ref 65–99)
PHOSPHORUS: 2.5 mg/dL (ref 2.5–4.6)
Potassium: 4 mmol/L (ref 3.5–5.1)
SODIUM: 141 mmol/L (ref 135–145)

## 2015-12-24 LAB — GLUCOSE, CAPILLARY
GLUCOSE-CAPILLARY: 153 mg/dL — AB (ref 65–99)
GLUCOSE-CAPILLARY: 180 mg/dL — AB (ref 65–99)
Glucose-Capillary: 243 mg/dL — ABNORMAL HIGH (ref 65–99)
Glucose-Capillary: 190 mg/dL — ABNORMAL HIGH (ref 65–99)

## 2015-12-24 LAB — APTT: APTT: 29 s (ref 24–36)

## 2015-12-24 MED ORDER — IOPAMIDOL (ISOVUE-370) INJECTION 76%
INTRAVENOUS | Status: AC
  Filled 2015-12-24: qty 50

## 2015-12-24 MED ORDER — FERROUS SULFATE 325 (65 FE) MG PO TABS
325.0000 mg | ORAL_TABLET | Freq: Three times a day (TID) | ORAL | Status: DC
  Administered 2015-12-24 – 2015-12-28 (×9): 325 mg via ORAL
  Filled 2015-12-24 (×8): qty 1

## 2015-12-24 MED ORDER — MINERAL OIL RE ENEM
1.0000 | ENEMA | Freq: Once | RECTAL | Status: AC
  Administered 2015-12-25: 1 via RECTAL
  Filled 2015-12-24 (×2): qty 1

## 2015-12-24 MED ORDER — STROKE: EARLY STAGES OF RECOVERY BOOK
Freq: Once | Status: DC
  Filled 2015-12-24 (×2): qty 1

## 2015-12-24 NOTE — Progress Notes (Signed)
Patient Name: Heidi Campbell Date of Encounter: 12/24/2015  Primary Cardiologist: Dr. Alanda Slim Problem List     Principal Problem:   NSTEMI (non-ST elevated myocardial infarction) [facility]) Active Problems:   Type 2 diabetes mellitus treated with insulin (HCC)   Esophageal reflux   Hypertension   Coronary atherosclerosis of native coronary artery   Hyperlipidemia   Atypical chest pain   Normocytic anemia   Thrombocytopenia (HCC)   Elevated troponin   Lethargy   HIT (heparin-induced thrombocytopenia) (HCC)     Subjective   She feels well this morning. No further chest pain or SOB.  No obvious bleeding. Platelets improved.  Inpatient Medications    Scheduled Meds: . amLODipine  10 mg Oral Daily  . apixaban  5 mg Oral BID  . aspirin  81 mg Oral Daily  . darbepoetin (ARANESP) injection - NON-DIALYSIS  40 mcg Subcutaneous Q Sun-1800  . doxazosin  4 mg Oral QHS  . ezetimibe  10 mg Oral QHS  . feeding supplement (PRO-STAT SUGAR FREE 64)  30 mL Oral BID  . gabapentin  400 mg Oral TID  . hydrALAZINE  50 mg Oral TID  . insulin aspart  0-5 Units Subcutaneous QHS  . insulin aspart  0-9 Units Subcutaneous TID WC  . insulin aspart  3 Units Subcutaneous TID WC  . insulin glargine  14 Units Subcutaneous Q2200  . mometasone-formoterol  2 puff Inhalation BID  . pantoprazole  40 mg Oral Daily  . rosuvastatin  40 mg Oral QHS   Continuous Infusions:  PRN Meds: acetaminophen, ALPRAZolam, bisacodyl, fluticasone, morphine injection, nitroGLYCERIN, ondansetron (ZOFRAN) IV, ondansetron, polyethylene glycol, technetium TC 57M diethylenetriame-pentaacetic acid, zolpidem   Vital Signs    Vitals:   12/23/15 0905 12/23/15 1500 12/23/15 2034 12/24/15 0533  BP:  (!) 160/48 139/61 (!) 141/64  Pulse:  81 80 86  Resp:  18 18 12   Temp:  98.9 F (37.2 C) 98.7 F (37.1 C) 99.3 F (37.4 C)  TempSrc:  Oral Oral Oral  SpO2: 99% 100% 98% 97%  Weight:    225 lb 11.2 oz (102.4 kg)    Height:        Intake/Output Summary (Last 24 hours) at 12/24/15 0753 Last data filed at 12/23/15 2200  Gross per 24 hour  Intake              942 ml  Output             1000 ml  Net              -58 ml   Filed Weights   12/22/15 0420 12/23/15 0607 12/24/15 0533  Weight: 227 lb 4.8 oz (103.1 kg) 230 lb (104.3 kg) 225 lb 11.2 oz (102.4 kg)    Physical Exam    General: resting in bed, no acute distress Cardiac: RRR, systolic murmur heard at upper sternal borders, DP pulses +2 b/l Pulm: clear to auscultation bilaterally, moving normal volumes of air Abd: soft, nondistended Ext: warm and well perfused, no pedal edema Neuro: alert and oriented X3   Labs    CBC  Recent Labs  12/23/15 0650 12/24/15 0619  WBC 9.3 10.6*  HGB 10.6* 10.7*  HCT 32.2* 33.0*  MCV 84.1 84.6  PLT 121* 154   Basic Metabolic Panel  Recent Labs  12/22/15 0632 12/23/15 0650 12/24/15 0618  NA 141 141 141  K 3.6 3.2* 4.0  CL 115* 112* 112*  CO2 20* 22 23  GLUCOSE 135* 159* 160*  BUN 18 19 21*  CREATININE 1.52* 1.35* 1.40*  CALCIUM 8.7* 9.1 9.2  PHOS 2.2*  --  2.5   Liver Function Tests  Recent Labs  12/22/15 0632 12/24/15 0618  ALBUMIN 2.4* 2.5*   No results for input(s): LIPASE, AMYLASE in the last 72 hours. Cardiac Enzymes No results for input(s): CKTOTAL, CKMB, CKMBINDEX, TROPONINI in the last 72 hours. BNP Invalid input(s): POCBNP D-Dimer No results for input(s): DDIMER in the last 72 hours. Hemoglobin A1C No results for input(s): HGBA1C in the last 72 hours. Fasting Lipid Panel No results for input(s): CHOL, HDL, LDLCALC, TRIG, CHOLHDL, LDLDIRECT in the last 72 hours. Thyroid Function Tests No results for input(s): TSH, T4TOTAL, T3FREE, THYROIDAB in the last 72 hours.  Invalid input(s): FREET3  Telemetry    NSR, short runs of sinus tach - Personally Reviewed  ECG    11/16: NSR, low voltage III and aVF, normal R-wave progression - Personally Reviewed  Radiology     Dg Tibia/fibula Left  Result Date: 12/23/2015 CLINICAL DATA:  Recent fall with left lower extremity pain, initial encounter EXAM: LEFT TIBIA AND FIBULA - 2 VIEW COMPARISON:  None. FINDINGS: Degenerative changes about the knee joint are noted. No acute fracture or dislocation is seen. No gross soft tissue abnormality is noted. IMPRESSION: No acute abnormality noted. Electronically Signed   By: Alcide CleverMark  Lukens M.D.   On: 12/23/2015 16:07   Dg Knee Complete 4 Views Left  Result Date: 12/23/2015 CLINICAL DATA:  Knee pain following recent fall, initial encounter EXAM: LEFT KNEE - COMPLETE 4+ VIEW COMPARISON:  11/24/2015 FINDINGS: Tricompartmental degenerative changes are noted most marked in the medial joint space stable from the prior exam. No joint effusion is seen. No acute fracture or dislocation is noted. IMPRESSION: Degenerative change without acute abnormality. Electronically Signed   By: Alcide CleverMark  Lukens M.D.   On: 12/23/2015 16:13   Dg Femur Min 2 Views Left  Result Date: 12/23/2015 CLINICAL DATA:  Recent fall with left leg pain, initial encounter EXAM: LEFT FEMUR 2 VIEWS COMPARISON:  None. FINDINGS: No acute fracture or dislocation is noted. Degenerative changes are noted about the knee joint. No soft tissue abnormality is seen. IMPRESSION: No acute abnormality noted. Electronically Signed   By: Alcide CleverMark  Lukens M.D.   On: 12/23/2015 16:07    Cardiac Studies   TTE 12/05/2015: - Left ventricle: Wall thickness was increased in a pattern of mild LVH. Systolic function was normal. The estimated ejection fraction was in the range of 60% to 65%. - Pulmonary arteries: PA peak pressure: 44 mm Hg (S).  Patient Profile     70 year old female with a history of CAD s/p LHC 2012 (50% proximal LAD plaque), HTN, IDDM, Asthma, GERD, L3-L4 spondylolisthesis s/p decompressive laminectomy/foraminotomies (11/07/2015), and recent ARF who presented to the ED with chest pain, SOB, and fatigue found to have  NSTEMI.  Assessment & Plan    NSTEMI: Troponin peaked to 1.24 before trending down. EKG without acute ischemic changes. Will defer cath at this time and plan for outpatient stress test. -Plan for outpatient stress test -Continue ASA 81 mg daily  CAD: Known CAD s/p LHC in 09/2010 which was notable for moderate proximal LAD disease with 50% plaque and moderate diffuse disease in the remainder of the vessel. She was medically managed without percutaneous intervention. -Continue Aspirin 81 mg daily -Continue Crestor 40 mg qhs and Ezetimibe 10 mg qhs -Not on Beta Blocker, previously was taking  but d/c'ed for low HR -Plan for outpatient stress testing as above  Heparin Induced Thrombocytopenia: HIT antibody positive (OD 2.638) and Serotonin Release Assay positive. Platelets improved to 150k this morning. She denies any bleeding. She is at increased risk for thrombosis over the next 4 weeks and will need continued oral anticoagulation for 4-6 weeks for which she has been started on Eliquis. -Off Argatroban -Started on Eliquis 5 mg BID for 4-6 weeks  Normocytic Anemia: Received 2 units PRBCs 12/22/15, Hgb stable. No active bleeding.  Renal Disease: Acute Renal Failure earlier this month s/p emergent HD on 11/3 for hyperkalemia thought secondary to ARB use and hypotension/volume depletion. Creatinine stable. -Avoid nephrotoxic agents  ?Paroxysmal Atrial Fibrillation: CHADSVASc score is 5, 6.7% yearly stroke risk. Unclear if she was truly in atrial fibrillation that self-converted. Suspect this was ischemic related arrhythmia. No Afib on telemetry. - Anticoagulation as above in setting of NSTEMI and HIT  HTN: BP stable this morning. -On amlodipine 10 mg daily, hydralazine 50 mg TID, dozaxosin 4 mg qhs   Darreld McleanVishal Patel, MD  [facility] PGY-2 12/24/2015, 7:53 AM   History and all data above reviewed.  Patient examined.  I agree with the findings as above.  No chest pain.  No SOB.  The patient exam  reveals COR:RRR  ,  Lungs: Clear  ,  Abd: Positive bowel sounds, no rebound no guarding, Ext No edema  .  All available labs, radiology testing, previous records reviewed. Agree with documented assessment and plan. HIT:  Now on Eliquis.  Dr. Eldridge DaceVaranasi will discuss when to stop.  OK to discharge from our point.  She would need to have two week follow up with us and [facility]  Fayrene FearingJames Laythan Hayter  8:20 AM  12/24/2015

## 2015-12-24 NOTE — Progress Notes (Signed)
Per RN, code stroke called. Neuro saw pt. NP spoke with neuro. Per Dr. Amada JupiterKirkpatrick from neuro, pt likely had TIA. He will place orders. MRI neg.  KJKG, NP [facility]

## 2015-12-24 NOTE — Significant Event (Signed)
Rapid Response Event Note RN called for AMS and new temp 101.4 oral  Overview: Time Called: 2109 Arrival Time: 2112 Event Type: Neurologic  Initial Focused Assessment: Upon my arrival pt in lying in bed, skin warm and dry. Pt alert, answering all questions appropriately, following commands. Pt with Left side weakness and Left side facial droop. NIHSS 4, CBG 180, BP 122/82, HR 97, RR 16, 100% RA.   Interventions: Pt transported to CT at 2136, then to MRI at 2150. Dr. Amada JupiterKirkpatrick remains at bedside. Pt downgraded to TIA due to symptoms resolving. Craige CottaKirby NP notified at 2215 of recent findings.   Plan of Care (if not transferred): Pt to remain on 3w31, TIA work up, hand off given to [employer] Platt[employer]anell RN  Event Summary: Name of Physician Notified: Dr. Amada JupiterKirkpatrick  at 2116    at    Outcome: Stayed in room and stabalized     [place] Palmas[place]ULAR, Bevan Vu KossePaige

## 2015-12-24 NOTE — Progress Notes (Signed)
PROGRESS NOTE    Heidi Campbell  ZOX:[id number] DOB: 11/11/1945 DOA: 12/17/2015 PCP: Astrid Divine, MD   Brief Narrative:  70 yo female with stage IV CKD presented with chest pain. Precordial constant chest pain, associated with dyspnea. Clinically not in heart failure, noted troponin elevation with flat t waves. Admitted with acute coronary syndrome, started on IV heparin. 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   Assessment & Plan:   Principal Problem:   NSTEMI (non-ST elevated myocardial infarction) (HCC) Active Problems:   Type 2 diabetes mellitus treated with insulin (HCC)   Esophageal reflux   Hypertension   Coronary atherosclerosis of native coronary artery   Hyperlipidemia   Atypical chest pain   Normocytic anemia   Thrombocytopenia (HCC)   Elevated troponin   Lethargy   HIT (heparin-induced thrombocytopenia) (HCC)   1. Nstemi. Will continue blood pressure control with hydralazine - amlodipine, rosuvastatin and zetia. Patient chest pain free. Echocardiogram with normal LV systolic function. ekg persoally reviewed noted siuns rhythm with no ischemic changes. Per cardiology recommendations plan for outpatient stress test. Will continue antiplatelet therapy with asa.   2. T2DM. Will continue glucose cover and monitoring with sliding scale, capillary glucose  [phone]. Patient tolerating po well, no nausea or vomiting. Continue basal insulin with glargine 14 units and pre- meal aspart.   3. AKI. Renal function with worsening cr at 1,40 from 1,35, K at 4,0. Urine output 1000 cc. 20 mg IV furosemide on 11.20. Will follow renal panel in am. Avoid hypotension and nephrotoxic medications. Patient had one session of HD on 11/3 for hyperkalemia (on last admission).   4. HIT. Lower extremity dopplers negative for dvt per preliminary report. Elevated HIT up to 2,638. No signs of thrombosis, platelets up to 154, lowest 44 on 11/16. Haptoglobin elevated on  admission, with stable hb. Will check serotonin release assay, will continue apixaban. Patient with 4 points on the prediction rule 4T. Plan for 4 to 6 weeks of therapy.   5. HTN. Will continue blood pressure control with hydralazine 50 mg po tid and amlodipine.    6. Dyslipidemia. Continue statin therapy.   7. Iron deficiency anemia combined with anemia of chronic disease. Will start patient on po iron and continue  darbapoetin  DVT prophylaxis: apixaban.  Code Status: full Family Communication: No family at the bedside  Disposition Plan: home  Consultants:   Cardiology   Procedures:    Antimicrobials:       Subjective: Patient with no chest pain, no nausea or vomiting. Positive weakness and not feeling back to her baseline.   Objective: Vitals:   12/24/15 0700 12/24/15 0800 12/24/15 0806 12/24/15 0900  BP:      Pulse: 79 88  89  Resp: 13 17  16   Temp:      TempSrc:      SpO2: 96% 98% 98% 98%  Weight:      Height:        Intake/Output Summary (Last 24 hours) at 12/24/15 1240 Last data filed at 12/24/15 1201  Gross per 24 hour  Intake              480 ml  Output              825 ml  Net             -345 ml   Filed Weights   12/22/15 0420 12/23/15 0607 12/24/15 0533  Weight: 103.1 kg (227 lb 4.8 oz)  104.3 kg (230 lb) 102.4 kg (225 lb 11.2 oz)    Examination:  General exam: not in pain or dyspnea. Positive deconditioning.  E ENT: no pallor or icterus, oral mucosa moist. Respiratory system: Clear to auscultation. Respiratory effort normal. No wheezing, rales or rhonchi. Cardiovascular system: S1 & S2 heard, RRR. No JVD, murmurs, rubs, gallops or clicks. No pedal edema. Gastrointestinal system: Abdomen is nondistended, soft and nontender. No organomegaly or masses felt. Normal bowel sounds heard. Central nervous system: Alert and oriented. No focal neurological deficits. Extremities: Symmetric 5 x 5 power. Skin: No rashes, lesions or ulcers  Data Reviewed:  I have personally reviewed following labs and imaging studies  CBC:  Recent Labs Lab 12/19/15 0513 12/19/15 1855 12/20/15 0513 12/21/15 0344 12/22/15 0632 12/23/15 0650 12/24/15 0619  WBC 8.0 9.3 7.2 8.0 6.6 9.3 10.6*  NEUTROABS 4.8 5.9  --   --   --   --   --   HGB 8.3* 7.9* 7.7* 7.3* 7.1* 10.6* 10.7*  HCT 25.9* 24.8* 24.2* 22.9* 21.7* 32.2* 33.0*  MCV 87.5 87.0 86.4 86.4 86.1 84.1 84.6  PLT 47* 54* 55* 72* 91* 121* 154   Basic Metabolic Panel:  Recent Labs Lab 12/19/15 1855 12/20/15 0314 12/20/15 0801 12/21/15 0344 12/22/15 0632 12/23/15 0650 12/24/15 0618  NA 139 139  --  140 141 141 141  K 3.6 3.4*  --  3.7 3.6 3.2* 4.0  CL 111 111  --  114* 115* 112* 112*  CO2 21* 21*  --  20* 20* 22 23  GLUCOSE 208* 229*  --  173* 135* 159* 160*  BUN 24* 24*  --  23* 18 19 21*  CREATININE 1.90* 1.76*  --  1.55* 1.52* 1.35* 1.40*  CALCIUM 8.9 8.6*  --  8.7* 8.7* 9.1 9.2  MG 1.8  --  2.2 1.9  --   --   --   PHOS  --  2.9  --   --  2.2*  --  2.5   GFR: Estimated Creatinine Clearance: 42.7 mL/min (by C-G formula based on SCr of 1.4 mg/dL (H)). Liver Function Tests:  Recent Labs Lab 12/18/15 1047 12/19/15 1855 12/20/15 0314 12/22/15 0632 12/24/15 0618  AST 74* 89*  --   --   --   ALT 59* 55*  --   --   --   ALKPHOS 73 62  --   --   --   BILITOT 0.5 0.5  --   --   --   PROT 6.3* 6.1*  --   --   --   ALBUMIN 2.8* 2.6* 2.4* 2.4* 2.5*   No results for input(s): LIPASE, AMYLASE in the last 168 hours.  Recent Labs Lab 12/19/15 1833  AMMONIA 16   Coagulation Profile:  Recent Labs Lab 12/17/15 2333  INR 1.32   Cardiac Enzymes:  Recent Labs Lab 12/18/15 0529 12/18/15 1047 12/19/15 1855 12/20/15 0314 12/20/15 0800  TROPONINI 0.89* 0.53* 0.22* 0.15* 0.15*   BNP (last 3 results) No results for input(s): PROBNP in the last 8760 hours. HbA1C: No results for input(s): HGBA1C in the last 72 hours. CBG:  Recent Labs Lab 12/23/15 1137 12/23/15 1646  12/23/15 2033 12/24/15 0720 12/24/15 1147  GLUCAP 199* 179* 212* 153* 243*   Lipid Profile: No results for input(s): CHOL, HDL, LDLCALC, TRIG, CHOLHDL, LDLDIRECT in the last 72 hours. Thyroid Function Tests: No results for input(s): TSH, T4TOTAL, FREET4, T3FREE, THYROIDAB in the last 72 hours. Anemia Panel:  No results for input(s): VITAMINB12, FOLATE, FERRITIN, TIBC, IRON, RETICCTPCT in the last 72 hours. Sepsis Labs:  Recent Labs Lab 12/19/15 2140  LATICACIDVEN 1.2    Recent Results (from the past 240 hour(s))  MRSA PCR Screening     Status: None   Collection Time: 12/18/15  1:24 AM  Result Value Ref Range Status   MRSA by PCR NEGATIVE NEGATIVE Final    Comment:        The GeneXpert MRSA Assay (FDA approved for NASAL specimens only), is one component of a comprehensive MRSA colonization surveillance program. It is not intended to diagnose MRSA infection nor to guide or monitor treatment for MRSA infections.   Culture, Urine     Status: Abnormal   Collection Time: 12/19/15  8:01 PM  Result Value Ref Range Status   Specimen Description URINE, RANDOM  Final   Special Requests NONE  Final   Culture MULTIPLE SPECIES PRESENT, SUGGEST RECOLLECTION (A)  Final   Report Status 12/21/2015 FINAL  Final  Culture, blood (routine x 2)     Status: None (Preliminary result)   Collection Time: 12/19/15  9:02 PM  Result Value Ref Range Status   Specimen Description BLOOD RIGHT HAND  Final   Special Requests IN PEDIATRIC BOTTLE 1CC  Final   Culture NO GROWTH 4 DAYS  Final   Report Status PENDING  Incomplete  Culture, blood (routine x 2)     Status: None (Preliminary result)   Collection Time: 12/19/15  9:09 PM  Result Value Ref Range Status   Specimen Description BLOOD LEFT HAND  Final   Special Requests IN PEDIATRIC BOTTLE 3CC  Final   Culture NO GROWTH 4 DAYS  Final   Report Status PENDING  Incomplete         Radiology Studies: Dg Tibia/fibula Left  Result Date:  12/23/2015 CLINICAL DATA:  Recent fall with left lower extremity pain, initial encounter EXAM: LEFT TIBIA AND FIBULA - 2 VIEW COMPARISON:  None. FINDINGS: Degenerative changes about the knee joint are noted. No acute fracture or dislocation is seen. No gross soft tissue abnormality is noted. IMPRESSION: No acute abnormality noted. Electronically Signed   By: Alcide Clever M.D.   On: 12/23/2015 16:07   Dg Knee Complete 4 Views Left  Result Date: 12/23/2015 CLINICAL DATA:  Knee pain following recent fall, initial encounter EXAM: LEFT KNEE - COMPLETE 4+ VIEW COMPARISON:  11/24/2015 FINDINGS: Tricompartmental degenerative changes are noted most marked in the medial joint space stable from the prior exam. No joint effusion is seen. No acute fracture or dislocation is noted. IMPRESSION: Degenerative change without acute abnormality. Electronically Signed   By: Alcide Clever M.D.   On: 12/23/2015 16:13   Dg Femur Min 2 Views Left  Result Date: 12/23/2015 CLINICAL DATA:  Recent fall with left leg pain, initial encounter EXAM: LEFT FEMUR 2 VIEWS COMPARISON:  None. FINDINGS: No acute fracture or dislocation is noted. Degenerative changes are noted about the knee joint. No soft tissue abnormality is seen. IMPRESSION: No acute abnormality noted. Electronically Signed   By: Alcide Clever M.D.   On: 12/23/2015 16:07        Scheduled Meds: . amLODipine  10 mg Oral Daily  . apixaban  5 mg Oral BID  . darbepoetin (ARANESP) injection - NON-DIALYSIS  40 mcg Subcutaneous Q Sun-1800  . doxazosin  4 mg Oral QHS  . ezetimibe  10 mg Oral QHS  . feeding supplement (PRO-STAT SUGAR FREE 64)  30  mL Oral BID  . gabapentin  400 mg Oral TID  . hydrALAZINE  50 mg Oral TID  . insulin aspart  0-5 Units Subcutaneous QHS  . insulin aspart  0-9 Units Subcutaneous TID WC  . insulin aspart  3 Units Subcutaneous TID WC  . insulin glargine  14 Units Subcutaneous Q2200  . mometasone-formoterol  2 puff Inhalation BID  .  pantoprazole  40 mg Oral Daily  . rosuvastatin  40 mg Oral QHS   Continuous Infusions:   LOS: 7 days       Makynli Stills Annett Gulaaniel Eilan Mcinerny, MD [facility] Pager [phone]  If 7PM-7AM, please contact night-coverage [email or web address] Password [email or web address] 12/24/2015, 12:40 PM

## 2015-12-24 NOTE — Care Management Important Message (Signed)
Important Message  Patient Details  Name: Orson Aloevon V Osley MRN: [id number] Date of Birth: [birth year]   Medicare Important Message Given:  Yes    [name], Christyann Manolis Abena 12/24/2015, 12:43 PM

## 2015-12-24 NOTE — Clinical Social Work Note (Signed)
Per MD, patient will discharge tomorrow. SNF notified and are agreeable.  Charlynn CourtSarah Evanna Washinton, CSW [phone]

## 2015-12-24 NOTE — Consult Note (Signed)
Neurology Consultation Reason for Consult: transient left sided weakness Referring Physician: Dicie BeamKirby, K(Arrien, M attending)  CC: Left-sided weakness  History is obtained from:Patient, nursing  HPI: Heidi Campbell is a 70 y.o. female   Who was in her normal state of health around 7 PM when she had family visiting. Her nurse saw her at that time and states that she was in her normal state of health that time. Subsequently, she was noticed sometime around 9 cannot be acting like her normal self. She had been getting up and going to the bedside commode, but was unable to do so. Rapid response was called and found her to have bilateral leg weakness and left arm drift and left facial weakness. A code stroke was therefore called. On my initial exam, she did have a decreased nasolabial fold on the left with mild drift in her left arm. This did improve over the course of my evaluation. NIH stroke scale is 4  She was taking for a stat head CT which was negative, given that she has a contrast allergy an MRA was performed rather than CTA and this does not show any large vessel occlusion per my read.   She was significantly improved by the time my evaluation had ended, Though possibly still mildly confused.     LKW: 7 pm tpa given?: no, anticoagulated    ROS: A 14 point ROS was performed and is negative except as noted in the HPI.   Past Medical History:  Diagnosis Date  . ALLERGIC RHINITIS   . Arthritis   . ASTHMA   . Asthma   . CKD (chronic kidney disease), stage IV (HCC)   . Diabetes mellitus without complication (HCC)   . Esophageal reflux   . Hypertension   . NSTEMI (non-ST elevated myocardial infarction) (HCC)   . SINUSITIS, ACUTE      Family History  Problem Relation Age of Onset  . Diabetes Mother   . Heart attack Father   . Heart disease Father      Social History:  reports that she has never smoked. She has never used smokeless tobacco. She reports that she does not drink  alcohol or use drugs.   Exam: Current vital signs: BP 122/82 (BP Location: Left Arm)   Pulse 97   Temp (!) 101.4 F (38.6 C) (Oral)   Resp 16   Ht 5\' 3"  (1.6 m)   Wt 102.4 kg (225 lb 11.2 oz)   SpO2 100%   BMI 39.98 kg/m  Vital signs in last 24 hours: Temp:  [98.6 F (37 C)-101.4 F (38.6 C)] 101.4 F (38.6 C) (11/22 2112) Pulse Rate:  [79-97] 97 (11/22 1432) Resp:  [12-17] 16 (11/22 1432) BP: (122-148)/(64-82) 122/82 (11/22 2112) SpO2:  [96 %-100 %] 100 % (11/22 1432) Weight:  [102.4 kg (225 lb 11.2 oz)] 102.4 kg (225 lb 11.2 oz) (11/22 0533)   Physical Exam  Constitutional: Appears well-developed and well-nourished.  Psych: Affect appropriate to situation Eyes: No scleral injection HENT: No OP obstrucion Head: Normocephalic.  Cardiovascular: Normal rate and regular rhythm.  Respiratory: Effort normal and breath sounds normal to anterior ascultation GI: Soft.  No distension. There is no tenderness.  Skin: WDI  Neuro: Mental Status: Patient is awake, alert, oriented to person, place, age. Patient is able to give a clear and coherent history. No signs of aphasia or neglect She has a slow latency of response and seems slightly confused, but is able to answer most questions appropriately.  Cranial Nerves: II: Visual Fields are full. Pupils are equal, round, and reactive to light.   III,IV, VI: EOMI without ptosis or diploplia.  V: Facial sensation is symmetric to temperature VII: Facial movement with mild left facial weakness.  VIII: hearing is intact to voice X: Uvula elevates symmetrically XI: Shoulder shrug is symmetric. XII: tongue is midline without atrophy or fasciculations.  Motor: Tone is normal. Bulk is normal. 5/5 strength was present on the right, 4/5 left leg and arm weakness.  Sensory: Sensation is symmetric to light touch and temperature in the arms and legs. Cerebellar: No clear ataxia.      I have reviewed labs in epic and the results  pertinent to this consultation are: Cr 1.4  I have reviewed the images obtained:CThead - negative.   Impression: 70 year old female with likely transient ischemic attack. If she does have persistent symptoms, could consider repeating MRI as sometimes very early on, MRI can be negative. Either way, she is not a candidate for TPA due to anticoagulation. She would be at very high risk of thrombosis is not anticoagulated, and therefore I would favor continuing this.   Recommendations: 1. HgbA1c, fasting lipid panel 2. Frequent neuro checks 3. Echocardiogram 4. Carotid dopplers 5. Prophylactic therapy-eliquis 6. Risk factor modification 7. Telemetry monitoring 8. PT consult, OT consult, Speech consult 9. please page stroke NP  Or  PA  Or MD  M-F from 8am -4 pm starting 11/23 as this patient will be followed by the stroke team at this point.   You can look them up on [email or web address]      Ritta SlotMcNeill Annitta Fifield, MD [facility] [phone]  If 7pm- 7am, please page neurology on call as listed in AMION.

## 2015-12-24 NOTE — Progress Notes (Signed)
Patient has had several small hard BM and also past liquid, patient claims she hasn't had a BM in a couple weeks MD notified. Prn miralax given this am will continue to monitor.  Caresse Sedivy, Kae HellerMiranda Lynn, RN

## 2015-12-24 NOTE — Progress Notes (Signed)
*  Preliminary Results* Bilateral lower extremity venous duplex completed. Bilateral lower extremities are negative for deep vein thrombosis. There is no evidence of Baker's cyst bilaterally.  12/24/2015 3:32 PM Gertie FeyMichelle Wissam Resor, BS, RVT, RDCS, RDMS

## 2015-12-25 ENCOUNTER — Inpatient Hospital Stay (HOSPITAL_COMMUNITY): Payer: Medicare Other

## 2015-12-25 DIAGNOSIS — G459 Transient cerebral ischemic attack, unspecified: Secondary | ICD-10-CM

## 2015-12-25 DIAGNOSIS — I499 Cardiac arrhythmia, unspecified: Secondary | ICD-10-CM

## 2015-12-25 DIAGNOSIS — E784 Other hyperlipidemia: Secondary | ICD-10-CM

## 2015-12-25 DIAGNOSIS — R739 Hyperglycemia, unspecified: Secondary | ICD-10-CM

## 2015-12-25 DIAGNOSIS — I6522 Occlusion and stenosis of left carotid artery: Secondary | ICD-10-CM

## 2015-12-25 LAB — URINE MICROSCOPIC-ADD ON

## 2015-12-25 LAB — URINALYSIS, ROUTINE W REFLEX MICROSCOPIC
BILIRUBIN URINE: NEGATIVE
Glucose, UA: >1000 mg/dL — AB
Ketones, ur: NEGATIVE mg/dL
Nitrite: NEGATIVE
PH: 6 (ref 5.0–8.0)
Protein, ur: 100 mg/dL — AB
Specific Gravity, Urine: 1.017 (ref 1.005–1.030)

## 2015-12-25 LAB — CBC
HEMATOCRIT: 34.7 % — AB (ref 36.0–46.0)
HEMOGLOBIN: 11.3 g/dL — AB (ref 12.0–15.0)
MCH: 28 pg (ref 26.0–34.0)
MCHC: 32.6 g/dL (ref 30.0–36.0)
MCV: 85.9 fL (ref 78.0–100.0)
Platelets: 178 10*3/uL (ref 150–400)
RBC: 4.04 MIL/uL (ref 3.87–5.11)
RDW: 14.7 % (ref 11.5–15.5)
WBC: 14.5 10*3/uL — ABNORMAL HIGH (ref 4.0–10.5)

## 2015-12-25 LAB — BASIC METABOLIC PANEL
Anion gap: 8 (ref 5–15)
BUN: 25 mg/dL — AB (ref 6–20)
CALCIUM: 9.5 mg/dL (ref 8.9–10.3)
CHLORIDE: 109 mmol/L (ref 101–111)
CO2: 22 mmol/L (ref 22–32)
CREATININE: 1.63 mg/dL — AB (ref 0.44–1.00)
GFR calc non Af Amer: 31 mL/min — ABNORMAL LOW (ref >60)
GFR, EST AFRICAN AMERICAN: 36 mL/min — AB (ref >60)
GLUCOSE: 169 mg/dL — AB (ref 65–99)
Potassium: 4 mmol/L (ref 3.5–5.1)
Sodium: 139 mmol/L (ref 135–145)

## 2015-12-25 LAB — GLUCOSE, CAPILLARY
GLUCOSE-CAPILLARY: 263 mg/dL — AB (ref 65–99)
GLUCOSE-CAPILLARY: 161 mg/dL — AB (ref 65–99)
Glucose-Capillary: 191 mg/dL — ABNORMAL HIGH (ref 65–99)
Glucose-Capillary: 284 mg/dL — ABNORMAL HIGH (ref 65–99)

## 2015-12-25 LAB — LIPID PANEL
Cholesterol: 87 mg/dL (ref 0–200)
HDL: 34 mg/dL — ABNORMAL LOW (ref >40)
LDL CALC: 34 mg/dL (ref 0–99)
TRIGLYCERIDES: 97 mg/dL (ref <150)
Total CHOL/HDL Ratio: 2.6 RATIO
VLDL: 19 mg/dL (ref 0–40)

## 2015-12-25 LAB — APTT: APTT: 27 s (ref 24–36)

## 2015-12-25 MED ORDER — CARVEDILOL 3.125 MG PO TABS
3.1250 mg | ORAL_TABLET | Freq: Two times a day (BID) | ORAL | Status: DC
  Administered 2015-12-25 – 2015-12-28 (×6): 3.125 mg via ORAL
  Filled 2015-12-25 (×6): qty 1

## 2015-12-25 MED ORDER — SODIUM CHLORIDE 0.9 % IV SOLN
INTRAVENOUS | Status: DC
  Administered 2015-12-25 – 2015-12-26 (×2): via INTRAVENOUS

## 2015-12-25 NOTE — Progress Notes (Signed)
VASCULAR LAB PRELIMINARY  PRELIMINARY  PRELIMINARY  PRELIMINARY  Carotid duplex completed.    Preliminary report:  1-39% Right ICA stenosis.  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  Vertebral artery flow is antegrade.  Called results to [place], Charity fundraiserN.  Yareliz Thorstenson, RVT 12/25/2015, 3:18 PM

## 2015-12-25 NOTE — Progress Notes (Signed)
Patient Name: Heidi Campbell Date of Encounter: 12/25/2015  [facility]     Principal Problem:   NSTEMI (non-ST elevated myocardial infarction) [facility](HCC) Active Problems:   Type 2 diabetes mellitus treated with insulin (HCC)   Esophageal reflux   Hypertension   Coronary atherosclerosis of native coronary artery   Hyperlipidemia   Atypical chest pain   Normocytic anemia   Thrombocytopenia (HCC)   Elevated troponin   Lethargy   HIT (heparin-induced thrombocytopenia) (HCC)     Subjective   No CP  Inpatient Medications    Scheduled Meds: .  stroke: mapping our early stages of recovery book   Does not apply Once  . amLODipine  10 mg Oral Daily  . apixaban  5 mg Oral BID  . darbepoetin (ARANESP) injection - NON-DIALYSIS  40 mcg Subcutaneous Q Sun-1800  . doxazosin  4 mg Oral QHS  . ezetimibe  10 mg Oral QHS  . feeding supplement (PRO-STAT SUGAR FREE 64)  30 mL Oral BID  . ferrous sulfate  325 mg Oral TID WC  . gabapentin  400 mg Oral TID  . hydrALAZINE  50 mg Oral TID  . insulin aspart  0-5 Units Subcutaneous QHS  . insulin aspart  0-9 Units Subcutaneous TID WC  . insulin aspart  3 Units Subcutaneous TID WC  . insulin glargine  14 Units Subcutaneous Q2200  . mineral oil  1 enema Rectal Once  . mometasone-formoterol  2 puff Inhalation BID  . pantoprazole  40 mg Oral Daily  . rosuvastatin  40 mg Oral QHS   Continuous Infusions:  PRN Meds: acetaminophen, ALPRAZolam, bisacodyl, fluticasone, morphine injection, nitroGLYCERIN, ondansetron (ZOFRAN) IV, ondansetron, polyethylene glycol, technetium TC 21M diethylenetriame-pentaacetic acid, zolpidem   Vital Signs    Vitals:   12/25/15 0046 12/25/15 0307 12/25/15 0522 12/25/15 0633  BP: (!) 142/64 136/64 (!) 147/70   Pulse:   84   Resp: 17 18 14    Temp: 100 F (37.8 C) 99 F (37.2 C) 99 F (37.2 C)   TempSrc: Oral Oral Oral   SpO2:   97%   Weight:    102.2 kg (225 lb 4.8 oz)   Height:        Intake/Output Summary (Last 24 hours) at 12/25/15 1024 Last data filed at 12/24/15 1201  Gross per 24 hour  Intake                0 ml  Output              150 ml  Net             -150 ml   Filed Weights   12/23/15 0607 12/24/15 0533 12/25/15 [id number]  Weight: 104.3 kg (230 lb) 102.4 kg (225 lb 11.2 oz) 102.2 kg (225 lb 4.8 oz)    Physical Exam   GEN: Well nourished, well developed, in no acute distress.  HEENT: Grossly normal.  Neck: Supple, no JVD, carotid bruits, or masses. Cardiac: RRR, no murmurs, rubs, or gallops. No clubbing, cyanosis, edema.  Radials/DP/PT 2+ and equal bilaterally.  Respiratory:  Respirations regular and unlabored, clear to auscultation bilaterally. GI: Soft, nontender, nondistended, BS + x 4. MS: no deformity or atrophy. Skin: warm and dry, no rash. Neuro:  Strength and sensation are intact. Psych: AAOx3.  Normal affect.  Labs    CBC  Recent Labs  12/24/15 0619 12/25/15 0541  WBC 10.6* 14.5*  HGB 10.7* 11.3*  HCT 33.0* 34.7*  MCV 84.6 85.9  PLT 154 178   Basic Metabolic Panel  Recent Labs  12/24/15 0618 12/25/15 0541  NA 141 139  K 4.0 4.0  CL 112* 109  CO2 23 22  GLUCOSE 160* 169*  BUN 21* 25*  CREATININE 1.40* 1.63*  CALCIUM 9.2 9.5  PHOS 2.5  --    Liver Function Tests  Recent Labs  12/24/15 0618  ALBUMIN 2.5*   No results for input(s): LIPASE, AMYLASE in the last 72 hours. Cardiac Enzymes No results for input(s): CKTOTAL, CKMB, CKMBINDEX, TROPONINI in the last 72 hours. BNP Invalid input(s): POCBNP D-Dimer No results for input(s): DDIMER in the last 72 hours. Hemoglobin A1C No results for input(s): HGBA1C in the last 72 hours. Fasting Lipid Panel  Recent Labs  12/25/15 0541  CHOL 87  HDL 34*  LDLCALC 34  TRIG 97  CHOLHDL 2.6   Thyroid Function Tests No results for input(s): TSH, T4TOTAL, T3FREE, THYROIDAB in the last 72 hours.  Invalid input(s): FREET3  Telemetry    NSR - Personally  Reviewed  ECG    NSR , no significant ST changes on 11/17- Personally Reviewed  Radiology    Dg Tibia/fibula Left  Result Date: 12/23/2015 CLINICAL DATA:  Recent fall with left lower extremity pain, initial encounter EXAM: LEFT TIBIA AND FIBULA - 2 VIEW COMPARISON:  None. FINDINGS: Degenerative changes about the knee joint are noted. No acute fracture or dislocation is seen. No gross soft tissue abnormality is noted. IMPRESSION: No acute abnormality noted. Electronically Signed   By: Alcide Clever M.D.   On: 12/23/2015 16:07   Mr Maxine Glenn Head Wo Contrast  Result Date: 12/24/2015 CLINICAL DATA:  70 y/o  F; sudden onset left-sided weakness. EXAM: MRI HEAD WITHOUT CONTRAST MRA HEAD WITHOUT CONTRAST TECHNIQUE: Multiplanar, multiecho pulse sequences of the brain and surrounding structures were obtained without intravenous contrast. Angiographic images of the head were obtained using MRA technique without contrast. COMPARISON:  12/24/2015 CT head. FINDINGS: MRI HEAD FINDINGS Brain: No acute infarction, hemorrhage, hydrocephalus, extra-axial collection or mass lesion. Partially empty sella turcica. Diffusely increased susceptibility hypoenhancing throughout sulci on the of study white sequences probably due to iron supplementation. No abnormal enhancement of the brain parenchyma. Few nonspecific foci of T2 FLAIR hyperintensity in subcortical and periventricular white matter are compatible mild chronic microvascular ischemic changes. Mild parenchymal volume loss. Partially empty sella turcica. Vascular: See below. Skull and upper cervical spine: Normal marrow signal. Sinuses/Orbits: Negative. Other: None. MRA HEAD FINDINGS Internal carotid arteries:  Patent. Anterior cerebral arteries:  Patent. Middle cerebral arteries: Patent. Anterior communicating artery: Not identified, hypoplastic or absent. Posterior communicating arteries: Fetal right posterior cerebral artery. Diminutive left posterior communicating  artery. Posterior cerebral arteries:  Patent. Basilar artery:  Lower basilar fenestration. Vertebral arteries:  Patent. No evidence of high-grade stenosis, large vessel occlusion, or aneurysm. IMPRESSION: 1. No acute intracranial abnormality identified. 2. Mild chronic microvascular ischemic changes and parenchymal volume loss of the brain. 3. Diffuse sulcal susceptibility hypointensity is probably related to iron supplementation. 4. Partially empty sella turcica. 5. No high-grade stenosis, large vessel occlusion or aneurysm of the circle of Willis. Electronically Signed   By: Mitzi Hansen M.D.   On: 12/24/2015 23:09   Mr Brain Wo Contrast  Result Date: 12/24/2015 CLINICAL DATA:  70 y/o  F; sudden onset left-sided weakness. EXAM: MRI HEAD WITHOUT CONTRAST MRA HEAD WITHOUT CONTRAST TECHNIQUE: Multiplanar, multiecho pulse sequences of the brain and surrounding structures were obtained  without intravenous contrast. Angiographic images of the head were obtained using MRA technique without contrast. COMPARISON:  12/24/2015 CT head. FINDINGS: MRI HEAD FINDINGS Brain: No acute infarction, hemorrhage, hydrocephalus, extra-axial collection or mass lesion. Partially empty sella turcica. Diffusely increased susceptibility hypoenhancing throughout sulci on the of study white sequences probably due to iron supplementation. No abnormal enhancement of the brain parenchyma. Few nonspecific foci of T2 FLAIR hyperintensity in subcortical and periventricular white matter are compatible mild chronic microvascular ischemic changes. Mild parenchymal volume loss. Partially empty sella turcica. Vascular: See below. Skull and upper cervical spine: Normal marrow signal. Sinuses/Orbits: Negative. Other: None. MRA HEAD FINDINGS Internal carotid arteries:  Patent. Anterior cerebral arteries:  Patent. Middle cerebral arteries: Patent. Anterior communicating artery: Not identified, hypoplastic or absent. Posterior communicating  arteries: Fetal right posterior cerebral artery. Diminutive left posterior communicating artery. Posterior cerebral arteries:  Patent. Basilar artery:  Lower basilar fenestration. Vertebral arteries:  Patent. No evidence of high-grade stenosis, large vessel occlusion, or aneurysm. IMPRESSION: 1. No acute intracranial abnormality identified. 2. Mild chronic microvascular ischemic changes and parenchymal volume loss of the brain. 3. Diffuse sulcal susceptibility hypointensity is probably related to iron supplementation. 4. Partially empty sella turcica. 5. No high-grade stenosis, large vessel occlusion or aneurysm of the circle of Willis. Electronically Signed   By: Mitzi Hansen M.D.   On: 12/24/2015 23:09   Dg Knee Complete 4 Views Left  Result Date: 12/23/2015 CLINICAL DATA:  Knee pain following recent fall, initial encounter EXAM: LEFT KNEE - COMPLETE 4+ VIEW COMPARISON:  11/24/2015 FINDINGS: Tricompartmental degenerative changes are noted most marked in the medial joint space stable from the prior exam. No joint effusion is seen. No acute fracture or dislocation is noted. IMPRESSION: Degenerative change without acute abnormality. Electronically Signed   By: Alcide Clever M.D.   On: 12/23/2015 16:13   Dg Femur Min 2 Views Left  Result Date: 12/23/2015 CLINICAL DATA:  Recent fall with left leg pain, initial encounter EXAM: LEFT FEMUR 2 VIEWS COMPARISON:  None. FINDINGS: No acute fracture or dislocation is noted. Degenerative changes are noted about the knee joint. No soft tissue abnormality is seen. IMPRESSION: No acute abnormality noted. Electronically Signed   By: Alcide Clever M.D.   On: 12/23/2015 16:07   Ct Head Code Stroke Wo Contrast  Result Date: 12/24/2015 CLINICAL DATA:  Code stroke. EXAM: CT HEAD WITHOUT CONTRAST TECHNIQUE: Contiguous axial images were obtained from the base of the skull through the vertex without intravenous contrast. COMPARISON:  None. FINDINGS: Brain: No mass  lesion, intraparenchymal hemorrhage or extra-axial collection. No evidence of acute cortical infarct. There is periventricular hypoattenuation compatible with chronic microvascular disease. Vascular: No hyperdense vessel or unexpected calcification. Skull: Normal visualized skull base, calvarium and extracranial soft tissues. Sinuses/Orbits: No sinus fluid levels or advanced mucosal thickening. No mastoid effusion. Normal orbits. ASPECTS [facility] Stroke Program Early CT Score) - Ganglionic level infarction (caudate, lentiform nuclei, internal capsule, insula, M1-M3 cortex): 7 - Supraganglionic infarction (M4-M6 cortex): 3 Total score (0-10 with 10 being normal): 10 IMPRESSION: 1. No acute intracranial abnormality. 2. ASPECTS is 10. These results were called by telephone at the time of interpretation on 12/24/2015 at 9:57 pm to Dr. Ritta Slot , who verbally acknowledged these results. Electronically Signed   By: Deatra Robinson M.D.   On: 12/24/2015 21:57    Cardiac Studies   Prior cath with 50% LAD lesion  Patient Profile     70 y/o with troponin leak after back surgery  renal failure.  TIA last night.  HIT.  Assessment & Plan    Still CP free.  On Eliquis for HIT.  TIA last night.    Suspect that troponin leak was due to demand ischemia in the setting of her critical illness.  Plan is for outpatient Lexiscan Cardiolite.  Please call with questions.  Signed, Lance MussJayadeep Amos Gaber, MD  12/25/2015, 10:24 AM

## 2015-12-25 NOTE — Progress Notes (Addendum)
Went into patient's room and found patient asleep. Attempted to wake patient and explained I was going to give her an enema to help relieve her constipation. Patient agreed. When I asked patient to turn to her left side, she seemed to have a difficult time doing so. This is not the norm for this patient. I asked her to look at me and asked her name, birth date, and where she is. She told me her name, she was able to tell me her birth date but not the year, and could not tell me where she was. Pt's vital signs at this time were CBG 180, TEMP 101.4, BP 122/82, HR 97, RR 16 and 100% on room air. Called rapid response to assess patient. Rapid response nurse found the pt to have left side weakness and left side facial droop. Dr Amada JupiterKirkpatrick was called and a code stroke was initiated.

## 2015-12-25 NOTE — Progress Notes (Addendum)
STROKE TEAM PROGRESS NOTE   HISTORY OF PRESENT ILLNESS (per record) Heidi Campbell is a 70 y.o. inpatient female who was in her normal state of health around 7 PM 12/24/2015 ([facility]) when she had family visiting. Her nurse saw her at that time and states that she was in her normal state of health that time. Subsequently, she was noticed sometime around 9 cannot be acting like her normal self. She had been getting up and going to the bedside commode, but was unable to do so. Rapid response was called and found her to have bilateral leg weakness and left arm drift and left facial weakness. A code stroke was therefore called. On Dr. Alene Campbell's initial exam, she did have a decreased nasolabial fold on the left with mild drift in her left arm. This did improve over the course of his  evaluation. NIH stroke scale is 4.   She was taken for a stat head CT which was negative, given that she has a contrast allergy an MRA was performed rather than CTA and this does not show any large vessel occlusion per his read. She was significantly improved by the time the evaluation had ended, Though possibly still mildly confused. Patient was not administered IV t-PA secondary to being anticoagulated.    SUBJECTIVE (INTERVAL HISTORY) No family at bedside. She is lying in bed. She stated that she had TIA last night but can not tell me the details. However, as per notes, she had b/l leg weakness and left arm drift with left facial droop. MRI negative but MRA head showed questionable right M2 stenosis. However, the CUS today showed left ICA proximal high grade stenosis.    OBJECTIVE Temp:  [98.6 F (37 C)-101.4 F (38.6 C)] 99 F (37.2 C) (11/23 0522) Pulse Rate:  [84-97] 84 (11/23 0522) Cardiac Rhythm: Normal sinus rhythm (11/23 0522) Resp:  [14-18] 14 (11/23 0522) BP: (122-148)/(64-82) 147/70 (11/23 0522) SpO2:  [97 %-100 %] 97 % (11/23 0522) Weight:  [102.2 kg (225 lb 4.8 oz)] 102.2 kg (225 lb 4.8 oz) (11/23  [id number])  CBC:  Recent Labs Lab 12/19/15 0513 12/19/15 1855  12/24/15 0619 12/25/15 0541  WBC 8.0 9.3  < > 10.6* 14.5*  NEUTROABS 4.8 5.9  --   --   --   HGB 8.3* 7.9*  < > 10.7* 11.3*  HCT 25.9* 24.8*  < > 33.0* 34.7*  MCV 87.5 87.0  < > 84.6 85.9  PLT 47* 54*  < > 154 178  < > = values in this interval not displayed.  Basic Metabolic Panel:  Recent Labs Lab 12/20/15 0801 12/21/15 0344 12/22/15 0632  12/24/15 0618 12/25/15 0541  NA  --  140 141  < > 141 139  K  --  3.7 3.6  < > 4.0 4.0  CL  --  114* 115*  < > 112* 109  CO2  --  20* 20*  < > 23 22  GLUCOSE  --  173* 135*  < > 160* 169*  BUN  --  23* 18  < > 21* 25*  CREATININE  --  1.55* 1.52*  < > 1.40* 1.63*  CALCIUM  --  8.7* 8.7*  < > 9.2 9.5  MG 2.2 1.9  --   --   --   --   PHOS  --   --  2.2*  --  2.5  --   < > = values in this interval not displayed.  Lipid Panel:  Component Value Date/Time   CHOL 87 12/25/2015 0541   TRIG 97 12/25/2015 0541   HDL 34 (L) 12/25/2015 0541   CHOLHDL 2.6 12/25/2015 0541   VLDL 19 12/25/2015 0541   LDLCALC 34 12/25/2015 0541   HgbA1c:  Lab Results  Component Value Date   HGBA1C 9.8 (H) 12/18/2015   Urine Drug Screen:    Component Value Date/Time   LABOPIA NONE DETECTED 12/18/2015 1104   COCAINSCRNUR NONE DETECTED 12/18/2015 1104   LABBENZ POSITIVE (A) 12/18/2015 1104   AMPHETMU NONE DETECTED 12/18/2015 1104   THCU NONE DETECTED 12/18/2015 1104   LABBARB NONE DETECTED 12/18/2015 1104      IMAGING I have personally reviewed the radiological images below and agree with the radiology interpretations.  Ct Head Code Stroke Wo Contrast 12/24/2015 1. No acute intracranial abnormality. 2. ASPECTS is 10.   Mr Brain 81Wo Contrast Mr Heidi Campbell Head Wo Contrast 12/24/2015  1. No acute intracranial abnormality identified. 2. Mild chronic microvascular ischemic changes and parenchymal volume loss of the brain. 3. Diffuse sulcal susceptibility hypointensity is probably related to iron  supplementation. 4. Partially empty sella turcica. 5. No high-grade stenosis, large vessel occlusion or aneurysm of the circle of Willis.   Bilateral Lower Extremity Venous Duplex  Bilateral lower extremities are negative for deep vein thrombosis. There is no evidence of Baker's cyst bilaterally.  CUS - 1-39% Right ICA stenosis.  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  Vertebral artery flow is antegrade.  TTE pending  CTA head and neck - on hold due to elevated Cre   PHYSICAL EXAM  Temp:  [98.2 F (36.8 C)-101.4 F (38.6 C)] 98.2 F (36.8 C) (11/23 1355) Pulse Rate:  [84-90] 90 (11/23 1355) Resp:  [14-22] 22 (11/23 1355) BP: (122-158)/(64-82) 158/71 (11/23 1355) SpO2:  [97 %] 97 % (11/23 1355) Weight:  [225 lb 4.8 oz (102.2 kg)] 225 lb 4.8 oz (102.2 kg) (11/23 [id number])  General - Well nourished, well developed, in no apparent distress.  Ophthalmologic - Fundi not visualized due to eye movement.  Cardiovascular - Regular rate and rhythm.  Mental Status -  Level of arousal and orientation to time, place, and person were intact. Language including expression, naming, repetition, comprehension was assessed and found intact. Fund of Knowledge was assessed and was impaired  Cranial Nerves II - XII - II - Visual field intact OU. III, IV, VI - Extraocular movements intact. V - Facial sensation intact bilaterally. VII - Facial movement intact bilaterally. VIII - Hearing & vestibular intact bilaterally. X - Palate elevates symmetrically. XI - Chin turning & shoulder shrug intact bilaterally. XII - Tongue protrusion intact.  Motor Strength - The patient's strength was normal in all extremities and pronator drift was absent.  Bulk was normal and fasciculations were absent.   Motor Tone - Muscle tone was assessed at the neck and appendages and was normal.  Reflexes - The patient's reflexes were 1+ in all extremities and she had no pathological reflexes.  Sensory - Light  touch, temperature/pinprick were assessed and were symmetrical.    Coordination - The patient had normal movements in the hands with no ataxia or dysmetria.  Tremor was absent.  Gait and Station - deferred   ASSESSMENT/PLAN Ms. Heidi Aloevon V Rickert is a 70 y.o. female with history of hypertension, hyperlipidemia, diabetes mellitus, asthma, GERD, CAD, CKD-IV, arthritis, anemia who was admitted 12/17/2015 with CP. In hospital, developed L sided weakness. She did not receive IV t-PA due to being  anticoagulated with eliquis.   TIA - likely right brain TIA but difficult to localized as she did have b/l leg weakness during the episode. However, left ICA high grade stenosis makes left brain TIA also possible. Etiology for TIA is not clear, but could be related to questionable right M2 inferior branch stenosis, arrhythmia, HIT, small vessel disease with stroke risk factor of uncontrolled DM, HTN, and CAD.   Resultant  Deficit resolved  MRI  No acute stroke  MRA questionable right MCA inferior branch stenosis.   CTA head and neck on hold due to AKI requiring one session of HD  Carotid Doppler left ICA high grade stenosis   2D Echo  pending   LE dopplers negative for VTE  Recommend 30 day cardiac event monitoring to rule out afib as outpt  LDL 34  HgbA1c 9.8  eliquis for VTE prophylaxis  Diet heart healthy/carb modified Room service appropriate? Yes; Fluid consistency: Thin  No antithrombotic prior to admission, now on Eliquis (apixaban) 5mg  bid. Current plan for eliquis is 4-6 weeks.   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)   Serotonin Release Assay positive  On eliquis 5mg  bid   Plan for eliquis 4-6 weeks  No other thrombotic event except TIA so far  Arrhythmia   Multiple different forms of  arrhythmia observed on Tele  Questionable for afib  Cardiology considered acute reaction to cardiac ischemia  On eliquis now  Recommend 30 day cardiac event monitoring as outpt to rule out afib  Left ICA high grade stenosis  CUS showed left ICA 80-99% stenosis, high end of scale  CTA head and neck on hold due to AKI requiring one session of HD  Recommend VVS consult for left ICA high grade stenosis  BP goal 130-150 due to carotid stenosis  NSTEMI   elevated troponins   EKG without acute changes.   Plan OP stress test  Cardiology on board  Hypertension  Stable  BP goal 130-150 due to carotid stenosis  Hyperlipidemia  Home meds:  crestor 40 and zetia, resumed in hospital  LDL 34, goal < 70  Continue statin at discharge  Diabetes type II  HgbA1c 9.8, goal < 7.0  Uncontrolled  lantus at home, resumed in this admission  SSI  CBG monitoring  Need close PCP follow up   Other Stroke Risk Factors  Advanced age  Obesity, Body mass index is 39.91 kg/m., recommend weight loss, diet and exercise as appropriate   Coronary artery disease  Other Active Problems  GERD  AKI, resolving. Required 1 session HD.  Iron deficiency anemia combined with anemia of chronic disease  Hospital day # 8  Marvel Plan, MD PhD Stroke Neurology 12/25/2015 4:48 PM   To contact Stroke Continuity provider, please refer to [email or web address]. After hours, contact General Neurology

## 2015-12-25 NOTE — Progress Notes (Signed)
PROGRESS NOTE    Heidi Campbell  ZOX:[id number] DOB: 1945/06/18 DOA: 12/17/2015 PCP: Astrid Divine, MD    Brief Narrative:   70 yo female with stage IV CKD presented with chest pain. Precordial constant chest pain, associated with dyspnea. Clinically not in heart failure, noted troponin elevation with flat t waves. Admitted with acute coronary syndrome, started on IV heparin. 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 Developed TIA, clinically improved, neurology consulted.   Assessment & Plan:   Principal Problem:   NSTEMI (non-ST elevated myocardial infarction) (HCC) Active Problems:   Type 2 diabetes mellitus treated with insulin (HCC)   Esophageal reflux   Hypertension   Coronary atherosclerosis of native coronary artery   Hyperlipidemia   Atypical chest pain   Normocytic anemia   Thrombocytopenia (HCC)   Elevated troponin   Lethargy   HIT (heparin-induced thrombocytopenia) (HCC)   Transient cerebral ischemia   1. NSTEMI. Blood pressure control with hydralazine - amlodipine, antihyperlipidemic regimen with rosuvastatin and zetia. No asa due to thrombocytopenia. Blood pressure 147 to 158 with HR 90 BPM, will start patient on lowe dose coreg, hold on ace inh for now to prevent hypotension.   2. T2DM. Will continue glucose cover and monitoring with sliding scale, capillary glucose  180-191-284. Patient with no nausea or vomiting. Basal insulin with glargine 14 units and pre- meal aspart 3 units.   3. AKI. continue to worsen renal function, now with cr up to 1,63. Patient had furosemide on this admission, will place on gentle IV fluids for the next 24 hours and will follow renal panel in am, will avoid hypotension and nephrotoxic medications.   4. HIT. Lower extremity dopplers negative for dvt. Elevated HIT up to 2,638. Follow on serotonin release assay, patient with nstemi and TIA on this admission, will continue on apixban for now, platelets now at 178  and continue to improve.   5. HTN. Blood pressure control with hydralazine 50 mg po tid and amlodipine.    6. Dyslipidemia. Continue statin therapy, per home regimen.   7. Iron deficiency anemia combined with anemia of chronic disease. Continue supplemental iron and continue darbapoetin  8. TIA. Will continue neuro checks q 4 hours for the next 24 hours, follow on neurology recommendations, continue on telemetry monitoring. MRI and MRA negative. Will continue anticoagulation with apixaban. Follow on echocardiogram and carotid dopplers.   DVT prophylaxis: apixaban.  Code Status: full Family Communication: No family at the bedside  Disposition Plan: home   Consultants:   Cardiology   Neurology     Antimicrobials:    Subjective: Last night had rapid response and stroke alert due to focal neurological deficit, lower extremities. Imaging with no acute CVA, improved deficit. Neurology consulted, TIA.   Objective: Vitals:   12/25/15 0046 12/25/15 0307 12/25/15 0522 12/25/15 0633  BP: (!) 142/64 136/64 (!) 147/70   Pulse:   84   Resp: 17 18 14    Temp: 100 F (37.8 C) 99 F (37.2 C) 99 F (37.2 C)   TempSrc: Oral Oral Oral   SpO2:   97%   Weight:    102.2 kg (225 lb 4.8 oz)  Height:        Intake/Output Summary (Last 24 hours) at 12/25/15 1132 Last data filed at 12/24/15 1201  Gross per 24 hour  Intake                0 ml  Output  75 ml  Net              -75 ml   Filed Weights   12/23/15 0607 12/24/15 0533 12/25/15 [id number]  Weight: 104.3 kg (230 lb) 102.4 kg (225 lb 11.2 oz) 102.2 kg (225 lb 4.8 oz)    Examination:  General exam: deconditioned, not in pain or dyspnea E ENT: mild pallor but no icterus. Oral mucosa moist. Respiratory system: Clear to auscultation. Respiratory effort normal. No wheezing, rales or rhonchi. Cardiovascular system: S1 & S2 heard, RRR. No JVD, murmurs, rubs, gallops or clicks. No pedal edema. Gastrointestinal system: Abdomen  is nondistended, soft and nontender. No organomegaly or masses felt. Normal bowel sounds heard. Central nervous system: Alert and oriented. No focal neurological deficits. Extremities: Symmetric 5 x 5 power. Non focal.  Skin: No rashes, lesions or ulcers     Data Reviewed: I have personally reviewed following labs and imaging studies  CBC:  Recent Labs Lab 12/19/15 0513 12/19/15 1855  12/21/15 0344 12/22/15 0632 12/23/15 0650 12/24/15 0619 12/25/15 0541  WBC 8.0 9.3  < > 8.0 6.6 9.3 10.6* 14.5*  NEUTROABS 4.8 5.9  --   --   --   --   --   --   HGB 8.3* 7.9*  < > 7.3* 7.1* 10.6* 10.7* 11.3*  HCT 25.9* 24.8*  < > 22.9* 21.7* 32.2* 33.0* 34.7*  MCV 87.5 87.0  < > 86.4 86.1 84.1 84.6 85.9  PLT 47* 54*  < > 72* 91* 121* 154 178  < > = values in this interval not displayed. Basic Metabolic Panel:  Recent Labs Lab 12/19/15 1855 12/20/15 0314 12/20/15 0801 12/21/15 0344 12/22/15 [id number] 12/23/15 0650 12/24/15 0618 12/25/15 0541  NA 139 139  --  140 141 141 141 139  K 3.6 3.4*  --  3.7 3.6 3.2* 4.0 4.0  CL 111 111  --  114* 115* 112* 112* 109  CO2 21* 21*  --  20* 20* 22 23 22   GLUCOSE 208* 229*  --  173* 135* 159* 160* 169*  BUN 24* 24*  --  23* 18 19 21* 25*  CREATININE 1.90* 1.76*  --  1.55* 1.52* 1.35* 1.40* 1.63*  CALCIUM 8.9 8.6*  --  8.7* 8.7* 9.1 9.2 9.5  MG 1.8  --  2.2 1.9  --   --   --   --   PHOS  --  2.9  --   --  2.2*  --  2.5  --    GFR: Estimated Creatinine Clearance: 36.7 mL/min (by C-G formula based on SCr of 1.63 mg/dL (H)). Liver Function Tests:  Recent Labs Lab 12/19/15 1855 12/20/15 0314 12/22/15 [id number] 12/24/15 0618  AST 89*  --   --   --   ALT 55*  --   --   --   ALKPHOS 62  --   --   --   BILITOT 0.5  --   --   --   PROT 6.1*  --   --   --   ALBUMIN 2.6* 2.4* 2.4* 2.5*   No results for input(s): LIPASE, AMYLASE in the last 168 hours.  Recent Labs Lab 12/19/15 1833  AMMONIA 16   Coagulation Profile: No results for input(s): INR,  PROTIME in the last 168 hours. Cardiac Enzymes:  Recent Labs Lab 12/19/15 1855 12/20/15 0314 12/20/15 0800  TROPONINI 0.22* 0.15* 0.15*   BNP (last 3 results) No results for input(s): PROBNP in the last  8760 hours. HbA1C: No results for input(s): HGBA1C in the last 72 hours. CBG:  Recent Labs Lab 12/24/15 1147 12/24/15 1604 12/24/15 2120 12/25/15 0713 12/25/15 1115  GLUCAP 243* 190* 180* 191* 284*   Lipid Profile:  Recent Labs  12/25/15 0541  CHOL 87  HDL 34*  LDLCALC 34  TRIG 97  CHOLHDL 2.6   Thyroid Function Tests: No results for input(s): TSH, T4TOTAL, FREET4, T3FREE, THYROIDAB in the last 72 hours. Anemia Panel: No results for input(s): VITAMINB12, FOLATE, FERRITIN, TIBC, IRON, RETICCTPCT in the last 72 hours. Sepsis Labs:  Recent Labs Lab 12/19/15 2140  LATICACIDVEN 1.2    Recent Results (from the past 240 hour(s))  MRSA PCR Screening     Status: None   Collection Time: 12/18/15  1:24 AM  Result Value Ref Range Status   MRSA by PCR NEGATIVE NEGATIVE Final    Comment:        The GeneXpert MRSA Assay (FDA approved for NASAL specimens only), is one component of a comprehensive MRSA colonization surveillance program. It is not intended to diagnose MRSA infection nor to guide or monitor treatment for MRSA infections.   Culture, Urine     Status: Abnormal   Collection Time: 12/19/15  8:01 PM  Result Value Ref Range Status   Specimen Description URINE, RANDOM  Final   Special Requests NONE  Final   Culture MULTIPLE SPECIES PRESENT, SUGGEST RECOLLECTION (A)  Final   Report Status 12/21/2015 FINAL  Final  Culture, blood (routine x 2)     Status: None   Collection Time: 12/19/15  9:02 PM  Result Value Ref Range Status   Specimen Description BLOOD RIGHT HAND  Final   Special Requests IN PEDIATRIC BOTTLE 1CC  Final   Culture NO GROWTH 5 DAYS  Final   Report Status 12/24/2015 FINAL  Final  Culture, blood (routine x 2)     Status: None    Collection Time: 12/19/15  9:09 PM  Result Value Ref Range Status   Specimen Description BLOOD LEFT HAND  Final   Special Requests IN PEDIATRIC BOTTLE 3CC  Final   Culture NO GROWTH 5 DAYS  Final   Report Status 12/24/2015 FINAL  Final         Radiology Studies: Dg Tibia/fibula Left  Result Date: 12/23/2015 CLINICAL DATA:  Recent fall with left lower extremity pain, initial encounter EXAM: LEFT TIBIA AND FIBULA - 2 VIEW COMPARISON:  None. FINDINGS: Degenerative changes about the knee joint are noted. No acute fracture or dislocation is seen. No gross soft tissue abnormality is noted. IMPRESSION: No acute abnormality noted. Electronically Signed   By: Alcide Clever M.D.   On: 12/23/2015 16:07   Mr Maxine Glenn Head Wo Contrast  Result Date: 12/24/2015 CLINICAL DATA:  70 y/o  F; sudden onset left-sided weakness. EXAM: MRI HEAD WITHOUT CONTRAST MRA HEAD WITHOUT CONTRAST TECHNIQUE: Multiplanar, multiecho pulse sequences of the brain and surrounding structures were obtained without intravenous contrast. Angiographic images of the head were obtained using MRA technique without contrast. COMPARISON:  12/24/2015 CT head. FINDINGS: MRI HEAD FINDINGS Brain: No acute infarction, hemorrhage, hydrocephalus, extra-axial collection or mass lesion. Partially empty sella turcica. Diffusely increased susceptibility hypoenhancing throughout sulci on the of study white sequences probably due to iron supplementation. No abnormal enhancement of the brain parenchyma. Few nonspecific foci of T2 FLAIR hyperintensity in subcortical and periventricular white matter are compatible mild chronic microvascular ischemic changes. Mild parenchymal volume loss. Partially empty sella turcica. Vascular: See below.  Skull and upper cervical spine: Normal marrow signal. Sinuses/Orbits: Negative. Other: None. MRA HEAD FINDINGS Internal carotid arteries:  Patent. Anterior cerebral arteries:  Patent. Middle cerebral arteries: Patent. Anterior  communicating artery: Not identified, hypoplastic or absent. Posterior communicating arteries: Fetal right posterior cerebral artery. Diminutive left posterior communicating artery. Posterior cerebral arteries:  Patent. Basilar artery:  Lower basilar fenestration. Vertebral arteries:  Patent. No evidence of high-grade stenosis, large vessel occlusion, or aneurysm. IMPRESSION: 1. No acute intracranial abnormality identified. 2. Mild chronic microvascular ischemic changes and parenchymal volume loss of the brain. 3. Diffuse sulcal susceptibility hypointensity is probably related to iron supplementation. 4. Partially empty sella turcica. 5. No high-grade stenosis, large vessel occlusion or aneurysm of the circle of Willis. Electronically Signed   By: Mitzi Hansen M.D.   On: 12/24/2015 23:09   Mr Brain Wo Contrast  Result Date: 12/24/2015 CLINICAL DATA:  70 y/o  F; sudden onset left-sided weakness. EXAM: MRI HEAD WITHOUT CONTRAST MRA HEAD WITHOUT CONTRAST TECHNIQUE: Multiplanar, multiecho pulse sequences of the brain and surrounding structures were obtained without intravenous contrast. Angiographic images of the head were obtained using MRA technique without contrast. COMPARISON:  12/24/2015 CT head. FINDINGS: MRI HEAD FINDINGS Brain: No acute infarction, hemorrhage, hydrocephalus, extra-axial collection or mass lesion. Partially empty sella turcica. Diffusely increased susceptibility hypoenhancing throughout sulci on the of study white sequences probably due to iron supplementation. No abnormal enhancement of the brain parenchyma. Few nonspecific foci of T2 FLAIR hyperintensity in subcortical and periventricular white matter are compatible mild chronic microvascular ischemic changes. Mild parenchymal volume loss. Partially empty sella turcica. Vascular: See below. Skull and upper cervical spine: Normal marrow signal. Sinuses/Orbits: Negative. Other: None. MRA HEAD FINDINGS Internal carotid arteries:   Patent. Anterior cerebral arteries:  Patent. Middle cerebral arteries: Patent. Anterior communicating artery: Not identified, hypoplastic or absent. Posterior communicating arteries: Fetal right posterior cerebral artery. Diminutive left posterior communicating artery. Posterior cerebral arteries:  Patent. Basilar artery:  Lower basilar fenestration. Vertebral arteries:  Patent. No evidence of high-grade stenosis, large vessel occlusion, or aneurysm. IMPRESSION: 1. No acute intracranial abnormality identified. 2. Mild chronic microvascular ischemic changes and parenchymal volume loss of the brain. 3. Diffuse sulcal susceptibility hypointensity is probably related to iron supplementation. 4. Partially empty sella turcica. 5. No high-grade stenosis, large vessel occlusion or aneurysm of the circle of Willis. Electronically Signed   By: Mitzi Hansen M.D.   On: 12/24/2015 23:09   Dg Knee Complete 4 Views Left  Result Date: 12/23/2015 CLINICAL DATA:  Knee pain following recent fall, initial encounter EXAM: LEFT KNEE - COMPLETE 4+ VIEW COMPARISON:  11/24/2015 FINDINGS: Tricompartmental degenerative changes are noted most marked in the medial joint space stable from the prior exam. No joint effusion is seen. No acute fracture or dislocation is noted. IMPRESSION: Degenerative change without acute abnormality. Electronically Signed   By: Alcide Clever M.D.   On: 12/23/2015 16:13   Dg Femur Min 2 Views Left  Result Date: 12/23/2015 CLINICAL DATA:  Recent fall with left leg pain, initial encounter EXAM: LEFT FEMUR 2 VIEWS COMPARISON:  None. FINDINGS: No acute fracture or dislocation is noted. Degenerative changes are noted about the knee joint. No soft tissue abnormality is seen. IMPRESSION: No acute abnormality noted. Electronically Signed   By: Alcide Clever M.D.   On: 12/23/2015 16:07   Ct Head Code Stroke Wo Contrast  Result Date: 12/24/2015 CLINICAL DATA:  Code stroke. EXAM: CT HEAD WITHOUT  CONTRAST TECHNIQUE: Contiguous axial images were obtained  from the base of the skull through the vertex without intravenous contrast. COMPARISON:  None. FINDINGS: Brain: No mass lesion, intraparenchymal hemorrhage or extra-axial collection. No evidence of acute cortical infarct. There is periventricular hypoattenuation compatible with chronic microvascular disease. Vascular: No hyperdense vessel or unexpected calcification. Skull: Normal visualized skull base, calvarium and extracranial soft tissues. Sinuses/Orbits: No sinus fluid levels or advanced mucosal thickening. No mastoid effusion. Normal orbits. ASPECTS [facility]([place] Stroke Program Early CT Score) - Ganglionic level infarction (caudate, lentiform nuclei, internal capsule, insula, M1-M3 cortex): 7 - Supraganglionic infarction (M4-M6 cortex): 3 Total score (0-10 with 10 being normal): 10 IMPRESSION: 1. No acute intracranial abnormality. 2. ASPECTS is 10. These results were called by telephone at the time of interpretation on 12/24/2015 at 9:57 pm to Dr. Ritta SlotMCNEILL KIRKPATRICK , who verbally acknowledged these results. Electronically Signed   By: Deatra RobinsonKevin  Herman M.D.   On: 12/24/2015 21:57        Scheduled Meds: .  stroke: mapping our early stages of recovery book   Does not apply Once  . amLODipine  10 mg Oral Daily  . apixaban  5 mg Oral BID  . darbepoetin (ARANESP) injection - NON-DIALYSIS  40 mcg Subcutaneous Q Sun-1800  . doxazosin  4 mg Oral QHS  . ezetimibe  10 mg Oral QHS  . feeding supplement (PRO-STAT SUGAR FREE 64)  30 mL Oral BID  . ferrous sulfate  325 mg Oral TID WC  . gabapentin  400 mg Oral TID  . hydrALAZINE  50 mg Oral TID  . insulin aspart  0-5 Units Subcutaneous QHS  . insulin aspart  0-9 Units Subcutaneous TID WC  . insulin aspart  3 Units Subcutaneous TID WC  . insulin glargine  14 Units Subcutaneous Q2200  . mometasone-formoterol  2 puff Inhalation BID  . pantoprazole  40 mg Oral Daily  . rosuvastatin  40 mg Oral QHS    Continuous Infusions:   LOS: 8 days        Eula Jaster Annett Gulaaniel Tambria Pfannenstiel, MD [facility] Pager [phone]  If 7PM-7AM, please contact night-coverage [email or web address] Password [email or web address] 12/25/2015, 11:32 AM

## 2015-12-26 ENCOUNTER — Inpatient Hospital Stay (HOSPITAL_COMMUNITY): Payer: Medicare Other

## 2015-12-26 DIAGNOSIS — G459 Transient cerebral ischemic attack, unspecified: Secondary | ICD-10-CM

## 2015-12-26 DIAGNOSIS — I6522 Occlusion and stenosis of left carotid artery: Secondary | ICD-10-CM

## 2015-12-26 DIAGNOSIS — I639 Cerebral infarction, unspecified: Secondary | ICD-10-CM

## 2015-12-26 LAB — CBC
HEMATOCRIT: 32.5 % — AB (ref 36.0–46.0)
Hemoglobin: 10.4 g/dL — ABNORMAL LOW (ref 12.0–15.0)
MCH: 27.7 pg (ref 26.0–34.0)
MCHC: 32 g/dL (ref 30.0–36.0)
MCV: 86.7 fL (ref 78.0–100.0)
Platelets: 179 10*3/uL (ref 150–400)
RBC: 3.75 MIL/uL — AB (ref 3.87–5.11)
RDW: 14.8 % (ref 11.5–15.5)
WBC: 12 10*3/uL — AB (ref 4.0–10.5)

## 2015-12-26 LAB — GLUCOSE, CAPILLARY
GLUCOSE-CAPILLARY: 179 mg/dL — AB (ref 65–99)
GLUCOSE-CAPILLARY: 168 mg/dL — AB (ref 65–99)
Glucose-Capillary: 180 mg/dL — ABNORMAL HIGH (ref 65–99)
Glucose-Capillary: 278 mg/dL — ABNORMAL HIGH (ref 65–99)

## 2015-12-26 LAB — BASIC METABOLIC PANEL
Anion gap: 8 (ref 5–15)
BUN: 23 mg/dL — ABNORMAL HIGH (ref 6–20)
CALCIUM: 8.6 mg/dL — AB (ref 8.9–10.3)
CO2: 22 mmol/L (ref 22–32)
CREATININE: 1.35 mg/dL — AB (ref 0.44–1.00)
Chloride: 109 mmol/L (ref 101–111)
GFR, EST AFRICAN AMERICAN: 45 mL/min — AB (ref >60)
GFR, EST NON AFRICAN AMERICAN: 39 mL/min — AB (ref >60)
Glucose, Bld: 158 mg/dL — ABNORMAL HIGH (ref 65–99)
Potassium: 3.5 mmol/L (ref 3.5–5.1)
SODIUM: 139 mmol/L (ref 135–145)

## 2015-12-26 LAB — VAS US CAROTID
LCCADDIAS: 17 cm/s
LCCADSYS: 89 cm/s
LCCAPDIAS: 15 cm/s
LCCAPSYS: 138 cm/s
LEFT ECA DIAS: -14 cm/s
LEFT VERTEBRAL DIAS: -24 cm/s
LICADSYS: -129 cm/s
Left ICA dist dias: -22 cm/s
Left ICA prox dias: -103 cm/s
Left ICA prox sys: -509 cm/s
RCCADSYS: -128 cm/s
RCCAPSYS: 87 cm/s
RIGHT ECA DIAS: -2 cm/s
RIGHT VERTEBRAL DIAS: 26 cm/s
Right CCA prox dias: 14 cm/s

## 2015-12-26 LAB — HEMOGLOBIN A1C
Hgb A1c MFr Bld: 8 % — ABNORMAL HIGH (ref 4.8–5.6)
MEAN PLASMA GLUCOSE: 183 mg/dL

## 2015-12-26 LAB — ECHOCARDIOGRAM COMPLETE
Height: 63 in
Weight: 3662.4 oz

## 2015-12-26 LAB — APTT: aPTT: 30 seconds (ref 24–36)

## 2015-12-26 MED ORDER — MINERAL OIL RE ENEM
1.0000 | ENEMA | Freq: Once | RECTAL | Status: DC
  Filled 2015-12-26: qty 1

## 2015-12-26 NOTE — Progress Notes (Signed)
Physical Therapy Treatment Patient Details Name: Heidi Campbell MRN: [id number] DOB: 1945/02/15 Today's Date: 12/26/2015    History of Present Illness 70 y.o. with a history of diabetes mellitus, hypertension, hyperlipidemia, CAD, laminectomy in oct 2017, wound infection and sepsis 11/11. Readmitted with NSTEMI    PT Comments    Patient having difficulty with mobility today due to fatigue.  Patient demonstrates unsafe mobility with sit > sidelying and during stance.  Reviewed importance of maintaining back precautions and fall prevention.  Agree with need for SNF at d/c for continued therapy.  Follow Up Recommendations  SNF     Equipment Recommendations  Rolling walker with 5" wheels    Recommendations for Other Services       Precautions / Restrictions Precautions Precautions: Back;Fall Precaution Comments: pt educated for back precautions Required Braces or Orthoses: Spinal Brace (Does not fit) Restrictions Weight Bearing Restrictions: No    Mobility  Bed Mobility Overal bed mobility: Needs Assistance Bed Mobility: Sit to Sidelying;Rolling Rolling: Min guard       Sit to sidelying: Mod assist General bed mobility comments: As patient sits on EOB, she "flops" onto her right side with LE's remaining off of bed.  Instructed patient that this is unsafe as well as breaks her back precautions.  Patient did same thing a second time, stating "I'm tired".  Patient c/o no pain.  Mod assist to bring LE's onto bed.  Transfers Overall transfer level: Needs assistance Equipment used: Rolling walker (2 wheeled) Transfers: Sit to/from [employer];Stand Pivot Transfers Sit to Stand: Mod assist Stand pivot transfers: Min assist       General transfer comment: Verbal cues for hand placement [facility].  Assist to power up to standing, and for balance once upright.   Initially patient leaning forward with forearms on RW.  Instructed patient to stand upright to  maintain back precautions.  Ambulation/Gait Ambulation/Gait assistance: Min assist Ambulation Distance (Feet): 3 Feet Assistive device: Rolling walker (2 wheeled) Gait Pattern/deviations: Step-to pattern     General Gait Details: Patient refused to ambulate to hallway.  Patient stood and stepped sideways [facility] prior to returning to sidelying.     Stairs            Wheelchair Mobility    Modified Rankin (Stroke Patients Only)       Balance             Standing balance-Leahy Scale: Poor Standing balance comment: Heavy reliance on RW.  Patient initially leaning forward onto elbows.  Cues to stand upright.                    Cognition Arousal/Alertness: Awake/alert Behavior During Therapy: Flat affect Overall Cognitive Status: Within Functional Limits for tasks assessed (Decreased safety awareness)                      Exercises      General Comments        Pertinent Vitals/Pain Pain Assessment: No/denies pain    Home Living                      Prior Function            PT Goals (current goals can now be found in the care plan section) Progress towards PT goals: Not progressing toward goals - comment (declines further mobility)    Frequency    Min 3X/week  PT Plan Current plan remains appropriate    Co-evaluation             End of Session Equipment Utilized During Treatment: Gait belt Activity Tolerance: Patient limited by fatigue Patient left: in bed;with call bell/phone within reach     Time: [id number] PT Time Calculation (min) (ACUTE ONLY): 12 min  Charges:  $Therapeutic Activity: 8-22 mins                    G Codes:      Vena AustriaSusan H Nicci Vaughan 12/26/2015, 2:30 PM Durenda HurtSusan H. Renaldo Fiddleravis, PT, [employer] Acute Rehab Services Pager [phone]

## 2015-12-26 NOTE — Progress Notes (Signed)
Echocardiogram 2D Echocardiogram has been performed.  Raheel Kunkle N Shauntelle Jamerson 12/26/2015, 9:45 AM 

## 2015-12-26 NOTE — Clinical Social Work Placement (Signed)
   CLINICAL SOCIAL WORK PLACEMENT  NOTE  Date:  12/26/2015  Patient Details  Name: Heidi Campbell MRN: [id number] Date of Birth: September 06, 1945  Clinical Social Work is seeking post-discharge placement for this patient at the Skilled  Nursing Facility level of care (*CSW will initial, date and re-position this form in  chart as items are completed):  Yes   Patient/family provided with [facility] Clinical Social Work Department's list of facilities offering this level of care within the geographic area requested by the patient (or if unable, by the patient's family).      Patient/family informed of their freedom to choose among providers that offer the needed level of care, that participate in Medicare, Medicaid or managed care program needed by the patient, have an available bed and are willing to accept the patient.      Patient/family informed of [facility]'s ownership interest in [facility] and [facility], as well as of the fact that they are under no obligation to receive care at these facilities.  PASRR submitted to EDS on       PASRR number received on       Existing PASRR number confirmed on 12/24/15     FL2 transmitted to all facilities in geographic area requested by pt/family on 12/24/15     FL2 transmitted to all facilities within larger geographic area on 12/24/15     Patient informed that his/her managed care company has contracts with or will negotiate with certain facilities, including the following:        Yes   Patient/family informed of bed offers received.  [facility]     Physician recommends and patient chooses bed at      [facility]  Patient to be transferred to facility by       Patient family notified on   of transfer.  Name of family member notified:        PHYSICIAN       Additional Comment:    _______________________________________________ Volney AmericanBridget A Mayton, LCSW 12/26/2015,  3:16 PM

## 2015-12-26 NOTE — Progress Notes (Signed)
Report received in patient's room via Josh RN using SBAR format, reviewed orders, tests, events of the day, assumed care of patient.

## 2015-12-26 NOTE — Consult Note (Signed)
Hospital Consult    Reason for Consult:  Carotid artery stenosis Referring Physician:  Hospitalist Service MRN #:  [id number]  History of Present Illness: This is a 70 y.o. female who was admitted on 12/17/15 with chest pain from [facility] she [facility].  She states that she had pain in her neck and then got very short of breath.  She was given asa and NTG and the chest pain subsided.  She was noted to have afib, but EKG in ED did not reveal this.  She was found to have thrombocytopenia with platelet count of 51k.  This has improved and is now 179k.  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  She is on Eliquis & current plan is for 4-6 weeks.  She did have a bump in her troponin and cardiology feels this was likely demand ischemia due to her illness.  The plan is for outpatient Lexiscan Cardiolite.  On 12/24/15, she was noted to have BLE weakness and left arm drift and left facial weakness and a code stroke was called.  Her CT scan was negative.  She does have a contrast allergy and an MRA was performed, which did not show any large vessel occlusion per neuro. She was not a candidate for TPA due to anticoagulation.    She did have a carotid u/s that revealed an 80-99% left ICA stenosis & 1-39% right ICA stenosis on 12/25/15.   In May 2016, she was found to have 60-79% left ICA stenosis and 40-59% right ICA stenosis.  The pt does not remember her symptoms.  She states that she is now having trouble with memory.  She states she has never had any stroke sx prior to this.  She did have lumbar surgery in October by Dr. Jordan LikesPool.  She was discharged home and was progressing until 11/22/15 when she presented to the ED when she had sudden onset of severe pain when she was not able to move her right leg secondary to pain.  She was not getting any relief from the the Valium or hydrocodone.  She presented to the hospital a couple days later with c/o weakness and a fall.  She states she has not walked  since.  Prior to surgery, she states that she was walking and denies any claudication sx or non healing wounds.     Upon subsequent hospitalization, she was found to have a wound infection at the site of her back surgery and MRI was significant for persistent post op fluid collection.  She was on Vanc/Zosyn.  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  She was hospitalized and underwent emergency dialysis for critical hyperkalemia.  She subsequently did not require further dialysis.   Her creatinine today is 1.35.  She does have diabetes & she is on insulin.  She is on a statin for cholesterol management.  She is on a CCB and BB for blood pressure control.    She has never smoked or drank alcohol.      Past Medical History:  Diagnosis Date  . ALLERGIC RHINITIS   . Arthritis   . ASTHMA   . Asthma   . CKD (chronic kidney disease), stage IV (HCC)   . Diabetes mellitus without complication (HCC)   . Esophageal reflux   . Hypertension   . NSTEMI (non-ST elevated myocardial infarction) (HCC)   . SINUSITIS, ACUTE     Past Surgical History:  Procedure  Laterality Date  . ABDOMINAL HYSTERECTOMY    . arthroscopic knee Right 2015  . BACK SURGERY    . BREAST REDUCTION SURGERY Bilateral 2006  . BREAST SURGERY    . HAND SURGERY Left    damaged nerve in hand  . IR GENERIC HISTORICAL  12/19/2015   IR FLUORO GUIDE CV LINE RIGHT 12/19/2015 Oley Balm, MD [facility]  . IR GENERIC HISTORICAL  12/19/2015   IR US GUIDE VASC ACCESS RIGHT [facility]    Allergies  Allergen Reactions  . 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 Ace Inhibitors Cough  . Ampicillin Other (See Comments)    Sensitive per lab paperwork from facility  . Cefazolin Other (See Comments)    Sensitive per lab paperwork from facility  . Cefepime Other (See Comments)    Sensitive per lab paperwork from facility  . Ceftazidime Other (See  Comments)    Sensitive per lab paperwork from facility  . Ceftriaxone Other (See Comments)    Sensitive per lab paperwork from facility  . Ciprofloxacin Other (See Comments)    Sensitive per lab paperwork from facility  . Contrast Media [Iodinated Diagnostic Agents] Other (See Comments)    Reaction unknown  . Ertapenem Other (See Comments)    Sensitive per lab paperwork from facility  . Gentamicin Other (See Comments)    Sensitive per lab paperwork from facility  . Imipenem Other (See Comments)    Sensitive per lab paperwork from facility  . Latex Other (See Comments)    Unknown reaction per MAR   . Levofloxacin Other (See Comments)    Sensitive per lab paperwork from facility  . Nitrofuran Derivatives Other (See Comments)    Sensitive per lab paperwork from facility  . Shellfish Allergy Other (See Comments)    Pt cannot recall at this time  . Tobramycin Other (See Comments)    Sensitive per lab paperwork from facility  . Zosyn [Piperacillin Sod-Tazobactam So] Other (See Comments)    Sensitive per lab paperwork from facility    Prior to Admission medications   Medication Sig Start Date End Date Taking? Authorizing Provider  acetaminophen (TYLENOL) 325 MG tablet Take 325 mg by mouth 3 (three) times daily.    Yes Historical Provider, MD  albuterol (PROVENTIL HFA;VENTOLIN HFA) 108 (90 BASE) MCG/ACT inhaler Inhale 2 puffs into the lungs every 6 (six) hours as needed for wheezing or shortness of breath.   Yes Historical Provider, MD  Amino Acids-Protein Hydrolys (FEEDING SUPPLEMENT, PRO-STAT SUGAR FREE 64,) LIQD Take 30 mLs by mouth 2 (two) times daily.   Yes Historical Provider, MD  amLODipine (NORVASC) 10 MG tablet Take 1 tablet (10 mg total) by mouth daily. 07/26/14  Yes Donnetta Hutching, MD  bisacodyl (DULCOLAX) 10 MG suppository Place 10 mg rectally every 4 (four) hours as needed for moderate constipation.    Yes Historical Provider, MD  doxazosin (CARDURA) 4 MG tablet take 1 TABLET AT  BEDTIME Patient taking differently: Take 4 mg by mouth at bedtime.  04/15/15  Yes Reather Littler, MD  ezetimibe (ZETIA) 10 MG tablet Take 1 tablet (10 mg total) by mouth daily. Patient taking differently: Take 10 mg by mouth at bedtime.  07/20/13  Yes Reather Littler, MD  fluticasone (FLONASE) 50 MCG/ACT nasal spray Place 1 spray into both nostrils daily as needed for allergies.    Yes Historical Provider, MD  Fluticasone-Salmeterol (ADVAIR) 100-50 MCG/DOSE AEPB Inhale 1 puff into the lungs  2 (two) times daily.    Yes Historical Provider, MD  gabapentin (NEURONTIN) 400 MG capsule Take 400 mg by mouth 3 (three) times daily.   Yes Historical Provider, MD  hydrALAZINE (APRESOLINE) 50 MG tablet Take 1 tablet by mouth 3 times a day. Patient taking differently: Take 50 mg by mouth 3 (three) times daily.  10/17/14  Yes Corky Crafts, MD  insulin aspart (NOVOLOG) 100 UNIT/ML injection Inject 10-23 Units into the skin See admin instructions. Takes 10 units twice daily, but if CBG 300-350 give addt'l 5 units and >350 give addt'l 8 units   Yes Historical Provider, MD  Insulin Glargine (LANTUS) 100 UNIT/ML Solostar Pen Inject 10 Units into the skin daily at 10 pm. 12/12/15  Yes Alison Murray, MD  nitroGLYCERIN (NITROSTAT) 0.4 MG SL tablet Place 0.4 mg under the tongue every 5 (five) minutes as needed for chest pain.   Yes Historical Provider, MD  omeprazole (PRILOSEC) 20 MG capsule Take 20 mg by mouth daily at 6 (six) AM.    Yes Historical Provider, MD  ondansetron (ZOFRAN ODT) 4 MG disintegrating tablet Take 1 tablet (4 mg total) by mouth every 8 (eight) hours as needed for nausea or vomiting. 01/27/15  Yes Shawn C Joy, PA-C  polyethylene glycol (MIRALAX / GLYCOLAX) packet Take 17 g by mouth daily as needed (For constipation.).    Yes Historical Provider, MD  potassium chloride (K-DUR) 10 MEQ tablet Take 1 tablet (10 mEq total) by mouth daily. 12/12/15  Yes Alison Murray, MD  rosuvastatin (CRESTOR) 40 MG tablet Take  1 tablet (40 mg total) by mouth daily. Patient taking differently: Take 40 mg by mouth at bedtime.  12/22/12  Yes Corky Crafts, MD    Social History   Social History  . Marital status: Widowed    Spouse name: N/A  . Number of children: N/A  . Years of education: N/A   Occupational History  . Not on file.   Social History Main Topics  . Smoking status: Never Smoker  . Smokeless tobacco: Never Used  . Alcohol use No  . Drug use: No  . Sexual activity: Not on file   Other Topics Concern  . Not on file   Social History Narrative  . No narrative on file     Family History  Problem Relation Age of Onset  . Diabetes Mother   . Heart attack Father   . Heart disease Father     ROS: [x]  Positive   [ ]  Negative   [ ]  All sytems reviewed and are negative  Cardiovascular: [x]  chest pain/pressure []  palpitations [x]  SOB  []  DOE []  pain in legs while walking []  pain in legs at rest []  pain in legs at night []  non-healing ulcers []  hx of DVT []  swelling in legs  Pulmonary: []  productive cough [x]  asthma/wheezing []  home O2  Neurologic: [x]  weakness in [x]  left arm [x]  legs [x]  pain and unable to move right leg earlier this month []  numbness in []  arms []  legs []  hx of CVA [x]  mini stroke [] difficulty speaking or slurred speech []  temporary loss of vision in one eye []  dizziness  Hematologic: []  hx of cancer []  bleeding problems []  problems with blood clotting easily [x]  HIT  Endocrine:   [x]  diabetes []  thyroid disease  GI []  vomiting blood []  blood in stool  GU: [x]  CKD/renal failure []  HD--[]  M/W/F or []  T/T/S []  burning with urination []  blood in urine  Psychiatric: []  anxiety []  depression  Musculoskeletal: [x]  arthritis [x]  back surgery 11/10/15 []  joint pain  Integumentary: []  rashes []  ulcers  Constitutional: []  fever []  chills   Physical Examination  Vitals:   12/25/15 2332 12/26/15 0523  BP: (!) 117/53 (!) 131/56    Pulse: 70 82  Resp:  (!) 27  Temp: 98.9 F (37.2 C) 98.7 F (37.1 C)   Body mass index is 40.55 kg/m.  General:  WDWN in NAD Gait: Not observed HENT: WNL, normocephalic Pulmonary: normal non-labored breathing, without Rales, rhonchi,  wheezing Cardiac: regular, without  Murmurs, rubs or gallops; with left carotid bruit Abdomen:  soft, NT/ND, no masses Skin: without rashes Vascular Exam/Pulses:  Right Left  Radial 2+ (normal) 2+ (normal)  Ulnar Unable to palpate  2+ (normal)  DP 2+ (normal) 2+ (normal)  PT Unable to palpate  2+ (normal)   Extremities: without ischemic changes, without Gangrene , without cellulitis; without open wounds;  Musculoskeletal: no muscle wasting or atrophy  Neurologic: A&O X 3;  No focal weakness or paresthesias are detected; speech is fluent/normal; moving all extremities equally.  Mild asymmetric smile. Psychiatric:  The pt has flat affect.   CBC    Component Value Date/Time   WBC 12.0 (H) 12/26/2015 0445   RBC 3.75 (L) 12/26/2015 0445   HGB 10.4 (L) 12/26/2015 0445   HCT 32.5 (L) 12/26/2015 0445   PLT 179 12/26/2015 0445   MCV 86.7 12/26/2015 0445   MCH 27.7 12/26/2015 0445   MCHC 32.0 12/26/2015 0445   RDW 14.8 12/26/2015 0445   LYMPHSABS 2.6 12/19/2015 1855   MONOABS 0.6 12/19/2015 1855   EOSABS 0.2 12/19/2015 1855   BASOSABS 0.0 12/19/2015 1855    BMET    Component Value Date/Time   NA 139 12/26/2015 0445   K 3.5 12/26/2015 0445   CL 109 12/26/2015 0445   CO2 22 12/26/2015 0445   GLUCOSE 158 (H) 12/26/2015 0445   BUN 23 (H) 12/26/2015 0445   CREATININE 1.35 (H) 12/26/2015 0445   CREATININE 1.22 (H) 11/14/2014 1144   CALCIUM 8.6 (L) 12/26/2015 0445   GFRNONAA 39 (L) 12/26/2015 0445   GFRAA 45 (L) 12/26/2015 0445    COAGS: Lab Results  Component Value Date   INR 1.32 12/17/2015     Non-Invasive Vascular Imaging:   Echo 12/26/15: Study Conclusions - Left ventricle: The cavity size was normal. Wall thickness was    normal. Systolic function was normal. The estimated ejection   fraction was in the range of 60% to 65%. Wall motion was normal;   there were no regional wall motion abnormalities. Features are   consistent with a pseudonormal left ventricular filling pattern,   with concomitant abnormal relaxation and increased filling   pressure (grade 2 diastolic dysfunction). - Pulmonary arteries: PA peak pressure: 33 mm Hg (S).  MRA 12/24/15: IMPRESSION: 1. No acute intracranial abnormality identified. 2. Mild chronic microvascular ischemic changes and parenchymal volume loss of the brain. 3. Diffuse sulcal susceptibility hypointensity is probably related to iron supplementation. 4. Partially empty sella turcica. 5. No high-grade stenosis, large vessel occlusion or aneurysm of the circle of Willis.  Statin:  Yes.   Beta Blocker:  Yes.   Aspirin:  No. ACEI:  No. ARB:  No. CCB use:  Yes Other antiplatelets/anticoagulants:  Yes.   Eliquis   ASSESSMENT/PLAN: This is a 70 y.o. female with recent NSTEMI and TIA found to have critical left ICA stenosis.     -pt's recent  symptoms do not really correlate to her left ICA stenosis, however, she was admitted earlier this month with c/o of right leg pain and weakness that was attributed to her recent back surgery.  This certainly could be due to her left ICA stenosis vs back issues -pt with NSTEMI this admission and is scheduled to undergo Lexiscan as an outpatient.  This will need to be done prior to any intervention by vascular surgery. -[facility] (SNF) as she has not ambulated really since her first visit in the ER after her surgery when she had RLE weakness.  PT continues to recommend SNF -earlier this month, she did have an AKI, which required temporary dialysis.  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 Darrick PennaFields will be in to see the pt this  afternoon to determine next course of action for this pt.   Doreatha MassedSamantha Rhyne, PA-C [facility] [phone]  History and exam details as above.  Pt is not a very good historian.  She denies any weakness symptoms this admission.  Head CT and MRI images reviewed no acute stroke.  Pt is marginal candidate for elective CEA due to her overall debilitated status, recent MI.  Will have her follow up with me in a few weeks after her stress test and decide whether or not she would benefit from elective CEA.  Plan discussed with pt and family.  Fabienne Brunsharles Faelyn Sigler, MD [facility] Office: [phone] Pager: [phone]

## 2015-12-26 NOTE — Progress Notes (Signed)
PROGRESS NOTE    Heidi Campbell  ZOX:[id number] DOB: 20-Mar-1945 DOA: 12/17/2015 PCP: Astrid Divine, MD    Brief Narrative:  70 yo female with stage IV CKD presented with chest pain. Precordial constant chest pain, associated with dyspnea. Clinically not in heart failure, noted troponin elevation with flat t waves. Admitted with acute coronary syndrome, started on IV heparin. 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 Developed TIA, clinically improved, neurology consulted.   Assessment & Plan:   Principal Problem:   NSTEMI (non-ST elevated myocardial infarction) (HCC) Active Problems:   Type 2 diabetes mellitus treated with insulin (HCC)   Esophageal reflux   Essential hypertension   Coronary atherosclerosis of native coronary artery   Other hyperlipidemia   Atypical chest pain   Normocytic anemia   Thrombocytopenia (HCC)   Elevated troponin   Lethargy   HIT (heparin-induced thrombocytopenia) (HCC)   Transient cerebral ischemia   Hyperglycemia   Stenosis of left carotid artery   Cardiac arrhythmia   1. NSTEMI.  No further chest pain, will continue cad regimen with b blocker, statin and ace inh, no antiplatelet therapy due to thrombocytopenia and HIT. Continue anticoagulation with apixaban.   2. T2DM. Glucose cover and monitoring with sliding scale, capillary glucose 161-179-180. Basal insulin with glargine 14 units and pre- meal aspart 3 units.   3. AKI.Renal function improved to 1,35 from 1,63. Responded well to IV fluids, patient clinically more euvolemic, will hold on IV fluids and follow on renal panel in am. Avoid hypotension or nephrotoxic medications.   4. HIT.  Follow on serotonin release assay, patient with nstemi and TIA on this admission, continue on apixban for now, platelets now at 179 and continue to improve. Plan for 4 to 6 weeks of therapy.   5. HTN. Blood pressure control with hydralazine 50 mg po tid and amlodipine. Blood  pressure 110 to 130.  6. Dyslipidemia. Continue statin therapy.   7. Iron deficiency anemia combined with anemia of chronic disease. Hb stable at 10 with hct at 32, continue supplemental iron and continue darbapoetin  8. TIA. Will continue neuro checks q 4 hours for the next 24 hours, follow on neurology recommendations, continue on telemetry monitoring. MRI and MRA negative. Will continue anticoagulation with apixaban. Echocardiogram with normal LV function and no signs of thrombosis, positive stenosis on the Left carotid artery, vascular surgery consulted. Physical therapy evaluation.   DVT prophylaxis:apixaban.  Code Status:full Family Communication:No family at the bedside  Disposition Plan:home     Consultants:   Cardiology    Procedures:    Antimicrobials:   Subjective: Patient feeling better, no nausea or vomiting, no weakness, no chest pain or dyspnea.   Objective: Vitals:   12/25/15 2053 12/25/15 2137 12/25/15 2332 12/26/15 0523  BP: (!) 141/56 (!) 141/56 (!) 117/53 (!) 131/56  Pulse: 75  70 82  Resp: 17   (!) 27  Temp: 100.1 F (37.8 C)  98.9 F (37.2 C) 98.7 F (37.1 C)  TempSrc: Oral  Oral Oral  SpO2: 99%  98% 98%  Weight:    103.8 kg (228 lb 14.4 oz)  Height:        Intake/Output Summary (Last 24 hours) at 12/26/15 0954 Last data filed at 12/26/15 0900  Gross per 24 hour  Intake             1485 ml  Output                0 ml  Net             1485 ml   Filed Weights   12/24/15 0533 12/25/15 0633 12/26/15 0523  Weight: 102.4 kg (225 lb 11.2 oz) 102.2 kg (225 lb 4.8 oz) 103.8 kg (228 lb 14.4 oz)    Examination:  General exam: not in pain or dyspnea, mild deconditioned. E ENT. Mild pallor, no icterus. Oral mucosa moist.   Respiratory system: Clear to auscultation. Respiratory effort normal. No wheezing, rales or rhonchi.  Cardiovascular system: S1 & S2 heard, RRR. No JVD, murmurs, rubs, gallops or clicks. Trace edema. Gastrointestinal  system: Abdomen is nondistended, soft and nontender. No organomegaly or masses felt. Normal bowel sounds heard. Central nervous system: Alert and oriented. No focal neurological deficits. Extremities: Symmetric 5 x 5 power. Skin: No rashes, lesions or ulcers     Data Reviewed: I have personally reviewed following labs and imaging studies  CBC:  Recent Labs Lab 12/19/15 1855  12/22/15 0632 12/23/15 0650 12/24/15 0619 12/25/15 0541 12/26/15 0445  WBC 9.3  < > 6.6 9.3 10.6* 14.5* 12.0*  NEUTROABS 5.9  --   --   --   --   --   --   HGB 7.9*  < > 7.1* 10.6* 10.7* 11.3* 10.4*  HCT 24.8*  < > 21.7* 32.2* 33.0* 34.7* 32.5*  MCV 87.0  < > 86.1 84.1 84.6 85.9 86.7  PLT 54*  < > 91* 121* 154 178 179  < > = values in this interval not displayed. Basic Metabolic Panel:  Recent Labs Lab 12/19/15 1855 12/20/15 0314 12/20/15 0801 12/21/15 0344 12/22/15 [id number] 12/23/15 0650 12/24/15 0618 12/25/15 0541 12/26/15 0445  NA 139 139  --  140 141 141 141 139 139  K 3.6 3.4*  --  3.7 3.6 3.2* 4.0 4.0 3.5  CL 111 111  --  114* 115* 112* 112* 109 109  CO2 21* 21*  --  20* 20* 22 23 22 22   GLUCOSE 208* 229*  --  173* 135* 159* 160* 169* 158*  BUN 24* 24*  --  23* 18 19 21* 25* 23*  CREATININE 1.90* 1.76*  --  1.55* 1.52* 1.35* 1.40* 1.63* 1.35*  CALCIUM 8.9 8.6*  --  8.7* 8.7* 9.1 9.2 9.5 8.6*  MG 1.8  --  2.2 1.9  --   --   --   --   --   PHOS  --  2.9  --   --  2.2*  --  2.5  --   --    GFR: Estimated Creatinine Clearance: 44.7 mL/min (by C-G formula based on SCr of 1.35 mg/dL (H)). Liver Function Tests:  Recent Labs Lab 12/19/15 1855 12/20/15 0314 12/22/15 [id number] 12/24/15 0618  AST 89*  --   --   --   ALT 55*  --   --   --   ALKPHOS 62  --   --   --   BILITOT 0.5  --   --   --   PROT 6.1*  --   --   --   ALBUMIN 2.6* 2.4* 2.4* 2.5*   No results for input(s): LIPASE, AMYLASE in the last 168 hours.  Recent Labs Lab 12/19/15 1833  AMMONIA 16   Coagulation Profile: No  results for input(s): INR, PROTIME in the last 168 hours. Cardiac Enzymes:  Recent Labs Lab 12/19/15 1855 12/20/15 0314 12/20/15 0800  TROPONINI 0.22* 0.15* 0.15*   BNP (last 3 results) No results for  input(s): PROBNP in the last 8760 hours. HbA1C: No results for input(s): HGBA1C in the last 72 hours. CBG:  Recent Labs Lab 12/25/15 0713 12/25/15 1115 12/25/15 1617 12/25/15 2145 12/26/15 0738  GLUCAP 191* 284* 263* 161* 179*   Lipid Profile:  Recent Labs  12/25/15 0541  CHOL 87  HDL 34*  LDLCALC 34  TRIG 97  CHOLHDL 2.6   Thyroid Function Tests: No results for input(s): TSH, T4TOTAL, FREET4, T3FREE, THYROIDAB in the last 72 hours. Anemia Panel: No results for input(s): VITAMINB12, FOLATE, FERRITIN, TIBC, IRON, RETICCTPCT in the last 72 hours. Sepsis Labs:  Recent Labs Lab 12/19/15 2140  LATICACIDVEN 1.2    Recent Results (from the past 240 hour(s))  MRSA PCR Screening     Status: None   Collection Time: 12/18/15  1:24 AM  Result Value Ref Range Status   MRSA by PCR NEGATIVE NEGATIVE Final    Comment:        The GeneXpert MRSA Assay (FDA approved for NASAL specimens only), is one component of a comprehensive MRSA colonization surveillance program. It is not intended to diagnose MRSA infection nor to guide or monitor treatment for MRSA infections.   Culture, Urine     Status: Abnormal   Collection Time: 12/19/15  8:01 PM  Result Value Ref Range Status   Specimen Description URINE, RANDOM  Final   Special Requests NONE  Final   Culture MULTIPLE SPECIES PRESENT, SUGGEST RECOLLECTION (A)  Final   Report Status 12/21/2015 FINAL  Final  Culture, blood (routine x 2)     Status: None   Collection Time: 12/19/15  9:02 PM  Result Value Ref Range Status   Specimen Description BLOOD RIGHT HAND  Final   Special Requests IN PEDIATRIC BOTTLE 1CC  Final   Culture NO GROWTH 5 DAYS  Final   Report Status 12/24/2015 FINAL  Final  Culture, blood (routine x 2)      Status: None   Collection Time: 12/19/15  9:09 PM  Result Value Ref Range Status   Specimen Description BLOOD LEFT HAND  Final   Special Requests IN PEDIATRIC BOTTLE 3CC  Final   Culture NO GROWTH 5 DAYS  Final   Report Status 12/24/2015 FINAL  Final         Radiology Studies: Mr Shirlee LatchMra Head WUWo Contrast  Result Date: 12/24/2015 CLINICAL DATA:  70 y/o  F; sudden onset left-sided weakness. EXAM: MRI HEAD WITHOUT CONTRAST MRA HEAD WITHOUT CONTRAST TECHNIQUE: Multiplanar, multiecho pulse sequences of the brain and surrounding structures were obtained without intravenous contrast. Angiographic images of the head were obtained using MRA technique without contrast. COMPARISON:  12/24/2015 CT head. FINDINGS: MRI HEAD FINDINGS Brain: No acute infarction, hemorrhage, hydrocephalus, extra-axial collection or mass lesion. Partially empty sella turcica. Diffusely increased susceptibility hypoenhancing throughout sulci on the of study white sequences probably due to iron supplementation. No abnormal enhancement of the brain parenchyma. Few nonspecific foci of T2 FLAIR hyperintensity in subcortical and periventricular white matter are compatible mild chronic microvascular ischemic changes. Mild parenchymal volume loss. Partially empty sella turcica. Vascular: See below. Skull and upper cervical spine: Normal marrow signal. Sinuses/Orbits: Negative. Other: None. MRA HEAD FINDINGS Internal carotid arteries:  Patent. Anterior cerebral arteries:  Patent. Middle cerebral arteries: Patent. Anterior communicating artery: Not identified, hypoplastic or absent. Posterior communicating arteries: Fetal right posterior cerebral artery. Diminutive left posterior communicating artery. Posterior cerebral arteries:  Patent. Basilar artery:  Lower basilar fenestration. Vertebral arteries:  Patent. No evidence of high-grade  stenosis, large vessel occlusion, or aneurysm. IMPRESSION: 1. No acute intracranial abnormality identified. 2.  Mild chronic microvascular ischemic changes and parenchymal volume loss of the brain. 3. Diffuse sulcal susceptibility hypointensity is probably related to iron supplementation. 4. Partially empty sella turcica. 5. No high-grade stenosis, large vessel occlusion or aneurysm of the circle of Willis. Electronically Signed   By: Mitzi Hansen M.D.   On: 12/24/2015 23:09   Mr Brain Wo Contrast  Result Date: 12/24/2015 CLINICAL DATA:  70 y/o  F; sudden onset left-sided weakness. EXAM: MRI HEAD WITHOUT CONTRAST MRA HEAD WITHOUT CONTRAST TECHNIQUE: Multiplanar, multiecho pulse sequences of the brain and surrounding structures were obtained without intravenous contrast. Angiographic images of the head were obtained using MRA technique without contrast. COMPARISON:  12/24/2015 CT head. FINDINGS: MRI HEAD FINDINGS Brain: No acute infarction, hemorrhage, hydrocephalus, extra-axial collection or mass lesion. Partially empty sella turcica. Diffusely increased susceptibility hypoenhancing throughout sulci on the of study white sequences probably due to iron supplementation. No abnormal enhancement of the brain parenchyma. Few nonspecific foci of T2 FLAIR hyperintensity in subcortical and periventricular white matter are compatible mild chronic microvascular ischemic changes. Mild parenchymal volume loss. Partially empty sella turcica. Vascular: See below. Skull and upper cervical spine: Normal marrow signal. Sinuses/Orbits: Negative. Other: None. MRA HEAD FINDINGS Internal carotid arteries:  Patent. Anterior cerebral arteries:  Patent. Middle cerebral arteries: Patent. Anterior communicating artery: Not identified, hypoplastic or absent. Posterior communicating arteries: Fetal right posterior cerebral artery. Diminutive left posterior communicating artery. Posterior cerebral arteries:  Patent. Basilar artery:  Lower basilar fenestration. Vertebral arteries:  Patent. No evidence of high-grade stenosis, large vessel  occlusion, or aneurysm. IMPRESSION: 1. No acute intracranial abnormality identified. 2. Mild chronic microvascular ischemic changes and parenchymal volume loss of the brain. 3. Diffuse sulcal susceptibility hypointensity is probably related to iron supplementation. 4. Partially empty sella turcica. 5. No high-grade stenosis, large vessel occlusion or aneurysm of the circle of Willis. Electronically Signed   By: Mitzi Hansen M.D.   On: 12/24/2015 23:09   Ct Head Code Stroke Wo Contrast  Result Date: 12/24/2015 CLINICAL DATA:  Code stroke. EXAM: CT HEAD WITHOUT CONTRAST TECHNIQUE: Contiguous axial images were obtained from the base of the skull through the vertex without intravenous contrast. COMPARISON:  None. FINDINGS: Brain: No mass lesion, intraparenchymal hemorrhage or extra-axial collection. No evidence of acute cortical infarct. There is periventricular hypoattenuation compatible with chronic microvascular disease. Vascular: No hyperdense vessel or unexpected calcification. Skull: Normal visualized skull base, calvarium and extracranial soft tissues. Sinuses/Orbits: No sinus fluid levels or advanced mucosal thickening. No mastoid effusion. Normal orbits. ASPECTS [facility] Stroke Program Early CT Score) - Ganglionic level infarction (caudate, lentiform nuclei, internal capsule, insula, M1-M3 cortex): 7 - Supraganglionic infarction (M4-M6 cortex): 3 Total score (0-10 with 10 being normal): 10 IMPRESSION: 1. No acute intracranial abnormality. 2. ASPECTS is 10. These results were called by telephone at the time of interpretation on 12/24/2015 at 9:57 pm to Dr. Ritta Slot , who verbally acknowledged these results. Electronically Signed   By: Deatra Robinson M.D.   On: 12/24/2015 21:57        Scheduled Meds: .  stroke: mapping our early stages of recovery book   Does not apply Once  . amLODipine  10 mg Oral Daily  . apixaban  5 mg Oral BID  . carvedilol  3.125 mg Oral BID WC  .  darbepoetin (ARANESP) injection - NON-DIALYSIS  40 mcg Subcutaneous Q Sun-1800  . doxazosin  4  mg Oral QHS  . ezetimibe  10 mg Oral QHS  . feeding supplement (PRO-STAT SUGAR FREE 64)  30 mL Oral BID  . ferrous sulfate  325 mg Oral TID WC  . gabapentin  400 mg Oral TID  . hydrALAZINE  50 mg Oral TID  . insulin aspart  0-5 Units Subcutaneous QHS  . insulin aspart  0-9 Units Subcutaneous TID WC  . insulin aspart  3 Units Subcutaneous TID WC  . insulin glargine  14 Units Subcutaneous Q2200  . mometasone-formoterol  2 puff Inhalation BID  . pantoprazole  40 mg Oral Daily  . rosuvastatin  40 mg Oral QHS   Continuous Infusions: . sodium chloride 75 mL/hr at 12/26/15 0706     LOS: 9 days        Dondre Catalfamo Annett Gula, MD [facility] Pager [phone]  If 7PM-7AM, please contact night-coverage [email or web address] Password [facility] 12/26/2015, 9:54 AM

## 2015-12-26 NOTE — Progress Notes (Signed)
STROKE TEAM PROGRESS NOTE   SUBJECTIVE (INTERVAL HISTORY) No family at bedside. She is lying in bed. No acute event overnight. CUS yesterday showed left ICA high grade stenosis. VVS consulted and pending decision.     OBJECTIVE Temp:  [98.2 F (36.8 C)-100.1 F (37.8 C)] 98.7 F (37.1 C) (11/24 0523) Pulse Rate:  [70-90] 82 (11/24 0523) Cardiac Rhythm: Normal sinus rhythm (11/23 2300) Resp:  [17-27] 27 (11/24 0523) BP: (117-158)/(53-71) 131/56 (11/24 0523) SpO2:  [95 %-99 %] 97 % (11/24 1002) Weight:  [103.8 kg (228 lb 14.4 oz)] 103.8 kg (228 lb 14.4 oz) (11/24 0523)  CBC:  Recent Labs Lab 12/19/15 1855  12/25/15 0541 12/26/15 0445  WBC 9.3  < > 14.5* 12.0*  NEUTROABS 5.9  --   --   --   HGB 7.9*  < > 11.3* 10.4*  HCT 24.8*  < > 34.7* 32.5*  MCV 87.0  < > 85.9 86.7  PLT 54*  < > 178 179  < > = values in this interval not displayed.  Basic Metabolic Panel:  Recent Labs Lab 12/20/15 0801 12/21/15 0344 12/22/15 [id number]  12/24/15 0618 12/25/15 0541 12/26/15 0445  NA  --  140 141  < > 141 139 139  K  --  3.7 3.6  < > 4.0 4.0 3.5  CL  --  114* 115*  < > 112* 109 109  CO2  --  20* 20*  < > 23 22 22   GLUCOSE  --  173* 135*  < > 160* 169* 158*  BUN  --  23* 18  < > 21* 25* 23*  CREATININE  --  1.55* 1.52*  < > 1.40* 1.63* 1.35*  CALCIUM  --  8.7* 8.7*  < > 9.2 9.5 8.6*  MG 2.2 1.9  --   --   --   --   --   PHOS  --   --  2.2*  --  2.5  --   --   < > = values in this interval not displayed.  Lipid Panel:     Component Value Date/Time   CHOL 87 12/25/2015 0541   TRIG 97 12/25/2015 0541   HDL 34 (L) 12/25/2015 0541   CHOLHDL 2.6 12/25/2015 0541   VLDL 19 12/25/2015 0541   LDLCALC 34 12/25/2015 0541   HgbA1c:  Lab Results  Component Value Date   HGBA1C 9.8 (H) 12/18/2015   Urine Drug Screen:     Component Value Date/Time   LABOPIA NONE DETECTED 12/18/2015 1104   COCAINSCRNUR NONE DETECTED 12/18/2015 1104   LABBENZ POSITIVE (A) 12/18/2015 1104   AMPHETMU NONE  DETECTED 12/18/2015 1104   THCU NONE DETECTED 12/18/2015 1104   LABBARB NONE DETECTED 12/18/2015 1104      IMAGING I have personally reviewed the radiological images below and agree with the radiology interpretations.  Ct Head Code Stroke Wo Contrast 12/24/2015 1. No acute intracranial abnormality. 2. ASPECTS is 10.   Mr Brain 28Wo Contrast Mr Shirlee LatchMra Head Wo Contrast 12/24/2015 1. No acute intracranial abnormality identified.  2. Mild chronic microvascular ischemic changes and parenchymal volume loss of the brain.  3. Diffuse sulcal susceptibility hypointensity is probably related to iron supplementation.  4. Partially empty sella turcica.  5. No high-grade stenosis, large vessel occlusion or aneurysm of the circle of Willis.    Bilateral Lower Extremity Venous Duplex  Bilateral lower extremities are negative for deep vein thrombosis. There is no evidence of Baker's cyst bilaterally.  CUS - 1-39% Right ICA stenosis.  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  Vertebral artery flow is antegrade.  TTE  12/26/2015 Study Conclusions - Left ventricle: The cavity size was normal. Wall thickness was   normal. Systolic function was normal. The estimated ejection   fraction was in the range of 60% to 65%. Wall motion was normal;   there were no regional wall motion abnormalities. Features are   consistent with a pseudonormal left ventricular filling pattern,   with concomitant abnormal relaxation and increased filling   pressure (grade 2 diastolic dysfunction). - Pulmonary arteries: PA peak pressure: 33 mm Hg (S).   CTA head and neck - on hold due to elevated Cre   PHYSICAL EXAM  Temp:  [98.2 F (36.8 C)-100.1 F (37.8 C)] 98.7 F (37.1 C) (11/24 0523) Pulse Rate:  [70-90] 82 (11/24 0523) Resp:  [17-27] 27 (11/24 0523) BP: (117-158)/(53-71) 131/56 (11/24 0523) SpO2:  [95 %-99 %] 97 % (11/24 1002) Weight:  [103.8 kg (228 lb 14.4 oz)] 103.8 kg (228 lb 14.4 oz) (11/24  0523)  General - Well nourished, well developed, in no apparent distress.  Ophthalmologic - Fundi not visualized due to eye movement.  Cardiovascular - Regular rate and rhythm.  Mental Status -  Level of arousal and orientation to time, place, and person were intact. Language including expression, naming, repetition, comprehension was assessed and found intact. Fund of Knowledge was assessed and was impaired  Cranial Nerves II - XII - II - Visual field intact OU. III, IV, VI - Extraocular movements intact. V - Facial sensation intact bilaterally. VII - Facial movement intact bilaterally. VIII - Hearing & vestibular intact bilaterally. X - Palate elevates symmetrically. XI - Chin turning & shoulder shrug intact bilaterally. XII - Tongue protrusion intact.  Motor Strength - The patient's strength was normal in all extremities and pronator drift was absent.  Bulk was normal and fasciculations were absent.   Motor Tone - Muscle tone was assessed at the neck and appendages and was normal.  Reflexes - The patient's reflexes were 1+ in all extremities and she had no pathological reflexes.  Sensory - Light touch, temperature/pinprick were assessed and were symmetrical.    Coordination - The patient had normal movements in the hands with no ataxia or dysmetria.  Tremor was absent.  Gait and Station - deferred   ASSESSMENT/PLAN Heidi Campbell is a 70 y.o. female with history of hypertension, hyperlipidemia, diabetes mellitus, asthma, GERD, CAD, CKD-IV, arthritis, anemia who was admitted 12/17/2015 with CP. In hospital, developed L sided weakness. She did not receive IV t-PA due to being anticoagulated with eliquis.   TIA - likely right brain TIA but difficult to localized as she did have b/l leg weakness during the episode. However, left ICA high grade stenosis makes left brain TIA also possible. Etiology for TIA is not clear, but could be related to questionable right M2 inferior  branch stenosis, arrhythmia, HIT, small vessel disease with stroke risk factor of uncontrolled DM, HTN, and CAD.   Resultant  Deficit resolved  MRI  No acute stroke  MRA questionable right MCA inferior branch stenosis.   Carotid Doppler left ICA high grade stenosis  CTA head and neck on hold due to AKI requiring one session of HD   Recommend VVS consultation   2D Echo - EF 60 - 65% No cardiac source of emboli identified.  LE dopplers negative for VTE  Recommend 30 day cardiac event monitoring  to rule out afib as outpt  LDL 34  HgbA1c 9.8  Eliquis for VTE prophylaxis Diet heart healthy/carb modified Room service appropriate? Yes; Fluid consistency: Thin  No antithrombotic prior to admission, now on Eliquis (apixaban) 5mg  bid. Current plan for eliquis is 4-6 weeks.   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)   Serotonin Release Assay positive  On eliquis 5mg  bid   Plan for eliquis 4-6 weeks  No other thrombotic event except TIA so far  Arrhythmia   Multiple different forms of arrhythmia observed on Tele  Questionable for afib  Cardiology considered acute reaction to cardiac ischemia  On eliquis now  Recommend 30 day cardiac event monitoring as outpt to rule out afib  Left ICA high grade stenosis  CUS showed left ICA 80-99% stenosis, high end of scale  CTA head and neck on hold due to AKI requiring one session of HD  Recommend VVS consult for left ICA high grade stenosis  BP goal 130-150 due to carotid stenosis  NSTEMI   elevated troponins   EKG without acute changes.   Plan OP stress test  Cardiology on board  AKI, improving  Required 1 session HD    Cre 10.0-> 2.88 ->2.63-> 1.90->1.55->1.35  Hypertension  Stable  BP goal 130-150 due to carotid  stenosis  Hyperlipidemia  Home meds:  crestor 40 and zetia, resumed in hospital  LDL 34, goal < 70  Continue statin at discharge  Diabetes type II  HgbA1c 9.8, goal < 7.0  Uncontrolled  lantus at home, resumed in this admission  SSI  CBG monitoring  Need close PCP follow up   Other Stroke Risk Factors  Advanced age  Obesity, Body mass index is 40.55 kg/m., recommend weight loss, diet and exercise as appropriate   Coronary artery disease  Other Active Problems  GERD  Iron deficiency anemia combined with anemia of chronic disease  Hospital day # 9  Marvel Plan, MD PhD Stroke Neurology 12/26/2015 2:56 PM   To contact Stroke Continuity provider, please refer to [email or web address]. After hours, contact General Neurology

## 2015-12-27 LAB — CBC
HCT: 32.4 % — ABNORMAL LOW (ref 36.0–46.0)
HEMOGLOBIN: 10.5 g/dL — AB (ref 12.0–15.0)
MCH: 28 pg (ref 26.0–34.0)
MCHC: 32.4 g/dL (ref 30.0–36.0)
MCV: 86.4 fL (ref 78.0–100.0)
Platelets: 189 10*3/uL (ref 150–400)
RBC: 3.75 MIL/uL — AB (ref 3.87–5.11)
RDW: 14.5 % (ref 11.5–15.5)
WBC: 12.2 10*3/uL — AB (ref 4.0–10.5)

## 2015-12-27 LAB — BASIC METABOLIC PANEL
ANION GAP: 7 (ref 5–15)
BUN: 26 mg/dL — ABNORMAL HIGH (ref 6–20)
CHLORIDE: 108 mmol/L (ref 101–111)
CO2: 23 mmol/L (ref 22–32)
Calcium: 8.7 mg/dL — ABNORMAL LOW (ref 8.9–10.3)
Creatinine, Ser: 1.45 mg/dL — ABNORMAL HIGH (ref 0.44–1.00)
GFR, EST AFRICAN AMERICAN: 41 mL/min — AB (ref >60)
GFR, EST NON AFRICAN AMERICAN: 36 mL/min — AB (ref >60)
Glucose, Bld: 135 mg/dL — ABNORMAL HIGH (ref 65–99)
POTASSIUM: 3.5 mmol/L (ref 3.5–5.1)
SODIUM: 138 mmol/L (ref 135–145)

## 2015-12-27 LAB — GLUCOSE, CAPILLARY
GLUCOSE-CAPILLARY: 151 mg/dL — AB (ref 65–99)
GLUCOSE-CAPILLARY: 204 mg/dL — AB (ref 65–99)
Glucose-Capillary: 112 mg/dL — ABNORMAL HIGH (ref 65–99)

## 2015-12-27 LAB — APTT: APTT: 31 s (ref 24–36)

## 2015-12-27 MED ORDER — BISACODYL 5 MG PO TBEC
10.0000 mg | DELAYED_RELEASE_TABLET | Freq: Once | ORAL | Status: AC
  Administered 2015-12-27: 10 mg via ORAL
  Filled 2015-12-27: qty 2

## 2015-12-27 NOTE — Progress Notes (Signed)
PROGRESS NOTE    Heidi Campbell  ZOX:[id number] DOB: 05-31-1945 DOA: 12/17/2015 PCP: Astrid DivineGRIFFIN,ELAINE COLLINS, MD    Brief Narrative:  70 yo female with stage IV CKD presented with chest pain. Precordial constant chest pain, associated with dyspnea. Clinically not in heart failure, noted troponin elevation with flat t waves. Admitted with acute coronary syndrome, started on IV heparin. 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 Developed TIA, clinically improved, neurology consulted. Positive stenosis of the left carotid. Plan for ischemia workup before carotid intervention. Will plan to discharge when afebrile for 24 hours. Patient obstipated.    Assessment & Plan:   Principal Problem:   NSTEMI (non-ST elevated myocardial infarction) (HCC) Active Problems:   Type 2 diabetes mellitus treated with insulin (HCC)   Esophageal reflux   Essential hypertension   Coronary atherosclerosis of native coronary artery   Other hyperlipidemia   Atypical chest pain   Normocytic anemia   Thrombocytopenia (HCC)   Elevated troponin   Lethargy   HIT (heparin-induced thrombocytopenia) (HCC)   Transient cerebral ischemia   Hyperglycemia   Stenosis of left carotid artery   Cardiac arrhythmia   Cerebrovascular accident (CVA) (HCC)    1. NSTEMI.  Continue cad regimen with b blocker, statin and ace inh, no antiplatelet therapy due to thrombocytopenia and HIT. Continue anticoagulation with apixaban. Will need stress test before carotid artery intervention. Continue statin and coreg.   2. T2DM. Glucose cover and monitoring with sliding scale, capillary glucose [phone]. Continue basal insulin with glargine 14 units and pre- meal aspart 3 units.   3. AKI.Renal function with cr at 1,4 with K at 3,5. Will continue to follow renal function in am. Continue to hold on IV fluids for now.  Avoid hypotension or nephrotoxic medications.   4. HIT.  Pending serotonin release assay, plan to  continue on apixban. Platelets continue to increase 189 today. Plan for 4 to 6 weeks of therapy with full anticoagulation.   5. HTN. Blood pressure control with hydralazine, coreg and amlodipine.   6. Dyslipidemia. Continue statin therapy, tolerating well.   7. Iron deficiency anemia combined with anemia of chronic disease. Stable cell count. Continue supplemental iron andcontinue darbapoetin  8. TIA. No further neurologic deficit, will continue physical therapy evaluation. Anticoagulation with apixaban and continue to hold on antiplatelet therapy. Will need intervention on left carotid as outpatient, after cardiology preoperative risk stratification with stress test.    9. Obstipation and fever. Will continue bowel regimen, add dulcolax po. Fever presumed to be related to obstipation. Will plan to discharge in am if afebrile for 24 hours.   DVT prophylaxis:apixaban.  Code Status:full Family Communication:No family at the bedside  Disposition Plan:home     Consultants:   Cardiology  Neurology  Vascular surgery  Procedures:   Antimicrobials:    Subjective: Patient not feeling well, no nausea or vomiting, but feeling distended, unable to move her bowels, no chest pain or dyspnea.   Objective: Vitals:   12/26/15 2304 12/27/15 0230 12/27/15 0539 12/27/15 1057  BP: 137/60  137/68   Pulse:   73   Resp: 16  15   Temp: (!) 100.7 F (38.2 C) 98.1 F (36.7 C) 98.5 F (36.9 C)   TempSrc: Oral Oral Oral   SpO2: 95%  98% 98%  Weight:  103.2 kg (227 lb 8 oz)    Height:        Intake/Output Summary (Last 24 hours) at 12/27/15 1406 Last data filed at 12/26/15  2000  Gross per 24 hour  Intake             1485 ml  Output                0 ml  Net             1485 ml   Filed Weights   12/25/15 0633 12/26/15 0523 12/27/15 0230  Weight: 102.2 kg (225 lb 4.8 oz) 103.8 kg (228 lb 14.4 oz) 103.2 kg (227 lb 8 oz)    Examination:  General exam: deconditioned, Not in  pain. E ENT: mild pallor but no icterus Respiratory system: Clear to auscultation. Respiratory effort normal. Decreased breath sounds at bases due to poor inspiratory effort.  Cardiovascular system: S1 & S2 heard, RRR. No JVD, murmurs, rubs, gallops or clicks. No pedal edema. Gastrointestinal system: Abdomen is distended, tympanic to percussion, increased bowel sounds, soft and nontender. No organomegaly or masses felt.  Central nervous system: Alert and oriented. No focal neurological deficits. Extremities: Symmetric 5 x 5 power. Skin: No rashes, lesions or ulcers  Data Reviewed: I have personally reviewed following labs and imaging studies  CBC:  Recent Labs Lab 12/23/15 0650 12/24/15 0619 12/25/15 0541 12/26/15 0445 12/27/15 0535  WBC 9.3 10.6* 14.5* 12.0* 12.2*  HGB 10.6* 10.7* 11.3* 10.4* 10.5*  HCT 32.2* 33.0* 34.7* 32.5* 32.4*  MCV 84.1 84.6 85.9 86.7 86.4  PLT 121* 154 178 179 189   Basic Metabolic Panel:  Recent Labs Lab 12/21/15 0344 12/22/15 0632 12/23/15 0650 12/24/15 0618 12/25/15 0541 12/26/15 0445 12/27/15 0535  NA 140 141 141 141 139 139 138  K 3.7 3.6 3.2* 4.0 4.0 3.5 3.5  CL 114* 115* 112* 112* 109 109 108  CO2 20* 20* 22 23 22 22 23   GLUCOSE 173* 135* 159* 160* 169* 158* 135*  BUN 23* 18 19 21* 25* 23* 26*  CREATININE 1.55* 1.52* 1.35* 1.40* 1.63* 1.35* 1.45*  CALCIUM 8.7* 8.7* 9.1 9.2 9.5 8.6* 8.7*  MG 1.9  --   --   --   --   --   --   PHOS  --  2.2*  --  2.5  --   --   --    GFR: Estimated Creatinine Clearance: 41.4 mL/min (by C-G formula based on SCr of 1.45 mg/dL (H)). Liver Function Tests:  Recent Labs Lab 12/22/15 1610 12/24/15 0618  ALBUMIN 2.4* 2.5*   No results for input(s): LIPASE, AMYLASE in the last 168 hours. No results for input(s): AMMONIA in the last 168 hours. Coagulation Profile: No results for input(s): INR, PROTIME in the last 168 hours. Cardiac Enzymes: No results for input(s): CKTOTAL, CKMB, CKMBINDEX, TROPONINI  in the last 168 hours. BNP (last 3 results) No results for input(s): PROBNP in the last 8760 hours. HbA1C:  Recent Labs  12/25/15 0541  HGBA1C 8.0*   CBG:  Recent Labs Lab 12/26/15 0738 12/26/15 1158 12/26/15 1707 12/26/15 2301 12/27/15 0756  GLUCAP 179* 180* 278* 168* 151*   Lipid Profile:  Recent Labs  12/25/15 0541  CHOL 87  HDL 34*  LDLCALC 34  TRIG 97  CHOLHDL 2.6   Thyroid Function Tests: No results for input(s): TSH, T4TOTAL, FREET4, T3FREE, THYROIDAB in the last 72 hours. Anemia Panel: No results for input(s): VITAMINB12, FOLATE, FERRITIN, TIBC, IRON, RETICCTPCT in the last 72 hours. Sepsis Labs: No results for input(s): PROCALCITON, LATICACIDVEN in the last 168 hours.  Recent Results (from the past 240 hour(s))  MRSA PCR Screening     Status: None   Collection Time: 12/18/15  1:24 AM  Result Value Ref Range Status   MRSA by PCR NEGATIVE NEGATIVE Final    Comment:        The GeneXpert MRSA Assay (FDA approved for NASAL specimens only), is one component of a comprehensive MRSA colonization surveillance program. It is not intended to diagnose MRSA infection nor to guide or monitor treatment for MRSA infections.   Culture, Urine     Status: Abnormal   Collection Time: 12/19/15  8:01 PM  Result Value Ref Range Status   Specimen Description URINE, RANDOM  Final   Special Requests NONE  Final   Culture MULTIPLE SPECIES PRESENT, SUGGEST RECOLLECTION (A)  Final   Report Status 12/21/2015 FINAL  Final  Culture, blood (routine x 2)     Status: None   Collection Time: 12/19/15  9:02 PM  Result Value Ref Range Status   Specimen Description BLOOD RIGHT HAND  Final   Special Requests IN PEDIATRIC BOTTLE 1CC  Final   Culture NO GROWTH 5 DAYS  Final   Report Status 12/24/2015 FINAL  Final  Culture, blood (routine x 2)     Status: None   Collection Time: 12/19/15  9:09 PM  Result Value Ref Range Status   Specimen Description BLOOD LEFT HAND  Final    Special Requests IN PEDIATRIC BOTTLE 3CC  Final   Culture NO GROWTH 5 DAYS  Final   Report Status 12/24/2015 FINAL  Final         Radiology Studies: No results found.      Scheduled Meds: .  stroke: mapping our early stages of recovery book   Does not apply Once  . amLODipine  10 mg Oral Daily  . apixaban  5 mg Oral BID  . bisacodyl  10 mg Oral Once  . carvedilol  3.125 mg Oral BID WC  . darbepoetin (ARANESP) injection - NON-DIALYSIS  40 mcg Subcutaneous Q Sun-1800  . doxazosin  4 mg Oral QHS  . ezetimibe  10 mg Oral QHS  . feeding supplement (PRO-STAT SUGAR FREE 64)  30 mL Oral BID  . ferrous sulfate  325 mg Oral TID WC  . gabapentin  400 mg Oral TID  . hydrALAZINE  50 mg Oral TID  . insulin aspart  0-5 Units Subcutaneous QHS  . insulin aspart  0-9 Units Subcutaneous TID WC  . insulin aspart  3 Units Subcutaneous TID WC  . insulin glargine  14 Units Subcutaneous Q2200  . mineral oil  1 enema Rectal Once  . mometasone-formoterol  2 puff Inhalation BID  . pantoprazole  40 mg Oral Daily  . rosuvastatin  40 mg Oral QHS   Continuous Infusions:   LOS: 10 days      Zayra Devito Annett Gulaaniel Jake Goodson, MD [facility] Pager [phone]  If 7PM-7AM, please contact night-coverage [email or web address] Password [email or web address] 12/27/2015, 2:06 PM

## 2015-12-27 NOTE — Progress Notes (Signed)
STROKE TEAM PROGRESS NOTE   SUBJECTIVE (INTERVAL HISTORY) No family at bedside. She is lying in bed. No acute event overnight. CUS yesterday showed left ICA high grade stenosis. VVS consulted and will have patient f/u outpatient   OBJECTIVE Temp:  [98.1 F (36.7 C)-100.7 F (38.2 C)] 98.5 F (36.9 C) (11/25 0539) Pulse Rate:  [73] 73 (11/25 0539) Cardiac Rhythm: Normal sinus rhythm (11/24 2100) Resp:  [15-16] 15 (11/25 0539) BP: (137)/(60-68) 137/68 (11/25 0539) SpO2:  [95 %-98 %] 98 % (11/25 0539) Weight:  [103.2 kg (227 lb 8 oz)] 103.2 kg (227 lb 8 oz) (11/25 0230)  CBC:   Recent Labs Lab 12/26/15 0445 12/27/15 0535  WBC 12.0* 12.2*  HGB 10.4* 10.5*  HCT 32.5* 32.4*  MCV 86.7 86.4  PLT 179 189    Basic Metabolic Panel:  Recent Labs Lab 12/21/15 0344 12/22/15 0632  12/24/15 0618  12/26/15 0445 12/27/15 0535  NA 140 141  < > 141  < > 139 138  K 3.7 3.6  < > 4.0  < > 3.5 3.5  CL 114* 115*  < > 112*  < > 109 108  CO2 20* 20*  < > 23  < > 22 23  GLUCOSE 173* 135*  < > 160*  < > 158* 135*  BUN 23* 18  < > 21*  < > 23* 26*  CREATININE 1.55* 1.52*  < > 1.40*  < > 1.35* 1.45*  CALCIUM 8.7* 8.7*  < > 9.2  < > 8.6* 8.7*  MG 1.9  --   --   --   --   --   --   PHOS  --  2.2*  --  2.5  --   --   --   < > = values in this interval not displayed.  Lipid Panel:     Component Value Date/Time   CHOL 87 12/25/2015 0541   TRIG 97 12/25/2015 0541   HDL 34 (L) 12/25/2015 0541   CHOLHDL 2.6 12/25/2015 0541   VLDL 19 12/25/2015 0541   LDLCALC 34 12/25/2015 0541   HgbA1c:  Lab Results  Component Value Date   HGBA1C 8.0 (H) 12/25/2015   Urine Drug Screen:     Component Value Date/Time   LABOPIA NONE DETECTED 12/18/2015 1104   COCAINSCRNUR NONE DETECTED 12/18/2015 1104   LABBENZ POSITIVE (A) 12/18/2015 1104   AMPHETMU NONE DETECTED 12/18/2015 1104   THCU NONE DETECTED 12/18/2015 1104   LABBARB NONE DETECTED 12/18/2015 1104      IMAGING I have personally reviewed  the radiological images below and agree with the radiology interpretations.  Ct Head Code Stroke Wo Contrast 12/24/2015 1. No acute intracranial abnormality. 2. ASPECTS is 10.   Mr Brain 31Wo Contrast Mr Shirlee LatchMra Head Wo Contrast 12/24/2015 1. No acute intracranial abnormality identified.  2. Mild chronic microvascular ischemic changes and parenchymal volume loss of the brain.  3. Diffuse sulcal susceptibility hypointensity is probably related to iron supplementation.  4. Partially empty sella turcica.  5. No high-grade stenosis, large vessel occlusion or aneurysm of the circle of Willis.    Bilateral Lower Extremity Venous Duplex  Bilateral lower extremities are negative for deep vein thrombosis. There is no evidence of Baker's cyst bilaterally.  CUS - 1-39% Right ICA stenosis.  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  Vertebral artery flow is antegrade.  TTE  12/26/2015 Study Conclusions - Left ventricle: The cavity size was normal. Wall thickness was  normal. Systolic function was normal. The estimated ejection   fraction was in the range of 60% to 65%. Wall motion was normal;   there were no regional wall motion abnormalities. Features are   consistent with a pseudonormal left ventricular filling pattern,   with concomitant abnormal relaxation and increased filling   pressure (grade 2 diastolic dysfunction). - Pulmonary arteries: PA peak pressure: 33 mm Hg (S).   CTA head and neck - on hold due to elevated Cre   PHYSICAL EXAM  Temp:  [98.1 F (36.7 C)-100.7 F (38.2 C)] 98.5 F (36.9 C) (11/25 0539) Pulse Rate:  [73] 73 (11/25 0539) Resp:  [15-16] 15 (11/25 0539) BP: (137)/(60-68) 137/68 (11/25 0539) SpO2:  [95 %-98 %] 98 % (11/25 0539) Weight:  [103.2 kg (227 lb 8 oz)] 103.2 kg (227 lb 8 oz) (11/25 0230)  General - Well nourished, well developed, in no apparent distress.  Ophthalmologic - Fundi not visualized due to eye movement.  Cardiovascular - Regular  rate and rhythm.  Mental Status -  Level of arousal and orientation to time, place, and person were intact. Language including expression, naming, repetition, comprehension was assessed and found intact. Fund of Knowledge was assessed and was impaired  Cranial Nerves II - XII - II - Visual field intact OU. III, IV, VI - Extraocular movements intact. V - Facial sensation intact bilaterally. VII - Facial movement intact bilaterally. VIII - Hearing & vestibular intact bilaterally. X - Palate elevates symmetrically. XI - Chin turning & shoulder shrug intact bilaterally. XII - Tongue protrusion intact.  Motor Strength - The patient's strength was normal in all extremities and pronator drift was absent.  Bulk was normal and fasciculations were absent.   Motor Tone - Muscle tone was assessed at the neck and appendages and was normal.  Reflexes - The patient's reflexes were 1+ in all extremities and she had no pathological reflexes.  Sensory - Light touch, temperature/pinprick were assessed and were symmetrical.    Coordination - The patient had normal movements in the hands with no ataxia or dysmetria.  Tremor was absent.  Gait and Station - deferred   ASSESSMENT/PLAN Heidi Campbell is a 70 y.o. female with history of hypertension, hyperlipidemia, diabetes mellitus, asthma, GERD, CAD, CKD-IV, arthritis, anemia who was admitted 12/17/2015 with CP. In hospital, developed L sided weakness. She did not receive IV t-PA due to being anticoagulated with eliquis.   TIA - likely right brain TIA but difficult to localized as she did have b/l leg weakness during the episode. However, left ICA high grade stenosis makes left brain TIA also possible. Etiology for TIA is not clear, but could be related to questionable right M2 inferior branch stenosis, arrhythmia, HIT, small vessel disease with stroke risk factor of uncontrolled DM, HTN, and CAD.   Resultant  Deficit resolved  MRI  No acute  stroke  MRA questionable right MCA inferior branch stenosis.   Carotid Doppler left ICA high grade stenosis  CTA head and neck on hold due to AKI requiring one session of HD   Recommend VVS consultation : they will follow her outpatient  2D Echo - EF 60 - 65% No cardiac source of emboli identified.  LE dopplers negative for VTE  Recommend 30 day cardiac event monitoring to rule out afib as outpt  LDL 34  HgbA1c 9.8  Eliquis for VTE prophylaxis Diet heart healthy/carb modified Room service appropriate? Yes; Fluid consistency: Thin  No antithrombotic prior to admission, now  on Eliquis (apixaban) 5mg  bid. Current plan for eliquis is 4-6 weeks.   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)   Serotonin Release Assay positive  On eliquis 5mg  bid   Plan for eliquis 4-6 weeks  No other thrombotic event except TIA so far  Arrhythmia   Multiple different forms of arrhythmia observed on Tele  Questionable for afib  Cardiology considered acute reaction to cardiac ischemia  On eliquis now  Recommend 30 day cardiac event monitoring as outpt to rule out afib  Left ICA high grade stenosis  CUS showed left ICA 80-99% stenosis, high end of scale  CTA head and neck on hold due to AKI requiring one session of HD  Recommend VVS consult for left ICA high grade stenosis; They have consulted and will see her outpatient  BP goal 130-150 due to carotid stenosis  NSTEMI   elevated troponins   EKG without acute changes.   Plan OP stress test  Cardiology on board  AKI, improving  Required 1 session HD    Cre 10.0-> 2.88 ->2.63-> 1.90->1.55->1.35  Hypertension  Stable  BP goal 130-150 due to carotid stenosis  Hyperlipidemia  Home meds:  crestor 40 and zetia, resumed in hospital  LDL 34,  goal < 70  Continue statin at discharge  Diabetes type II  HgbA1c 9.8, goal < 7.0  Uncontrolled  lantus at home, resumed in this admission  SSI  CBG monitoring  Need close PCP follow up   Other Stroke Risk Factors  Advanced age  Obesity, Body mass index is 40.3 kg/m., recommend weight loss, diet and exercise as appropriate   Coronary artery disease  Other Active Problems  GERD  Iron deficiency anemia combined with anemia of chronic disease  Stroke team will sign off. Please do not hesitate to contact us if needed.  Follow up with Dr. Roda Shutters in 2 months.   Hospital day # 10   Personally examined patient and images, and have participated in and made any corrections needed to history, physical, neuro exam,assessment and plan as stated above.  I have personally obtained the history, evaluated lab date, reviewed imaging studies and agree with radiology interpretations.      Naomie Dean, MD Stroke Neurology   To contact Stroke Continuity provider, please refer to [email or web address]. After hours, contact General Neurology

## 2015-12-27 NOTE — Consult Note (Signed)
No change overnight  Exam: Vitals:   12/26/15 1002 12/26/15 2304 12/27/15 0230 12/27/15 0539  BP:  137/60  137/68  Pulse:    73  Resp:  16  15  Temp:  (!) 100.7 F (38.2 C) 98.1 F (36.7 C) 98.5 F (36.9 C)  TempSrc:  Oral Oral Oral  SpO2: 97% 95%  98%  Weight:   227 lb 8 oz (103.2 kg)   Height:       Neuro at baseline 5/5  Motor upper and lower extremity no facial assymetry Follow up me as outpt after cardiac workup to consider CEA  Heidi Brunsharles Asencion Loveday, MD [facility] Office: [phone] Pager: [phone]

## 2015-12-28 DIAGNOSIS — G451 Carotid artery syndrome (hemispheric): Secondary | ICD-10-CM

## 2015-12-28 LAB — CBC
HCT: 32.6 % — ABNORMAL LOW (ref 36.0–46.0)
Hemoglobin: 10.6 g/dL — ABNORMAL LOW (ref 12.0–15.0)
MCH: 28 pg (ref 26.0–34.0)
MCHC: 32.5 g/dL (ref 30.0–36.0)
MCV: 86 fL (ref 78.0–100.0)
PLATELETS: 195 10*3/uL (ref 150–400)
RBC: 3.79 MIL/uL — ABNORMAL LOW (ref 3.87–5.11)
RDW: 14.5 % (ref 11.5–15.5)
WBC: 10.7 10*3/uL — ABNORMAL HIGH (ref 4.0–10.5)

## 2015-12-28 LAB — BASIC METABOLIC PANEL
Anion gap: 9 (ref 5–15)
BUN: 26 mg/dL — AB (ref 6–20)
CALCIUM: 8.5 mg/dL — AB (ref 8.9–10.3)
CO2: 22 mmol/L (ref 22–32)
Chloride: 107 mmol/L (ref 101–111)
Creatinine, Ser: 1.51 mg/dL — ABNORMAL HIGH (ref 0.44–1.00)
GFR calc Af Amer: 39 mL/min — ABNORMAL LOW (ref >60)
GFR, EST NON AFRICAN AMERICAN: 34 mL/min — AB (ref >60)
GLUCOSE: 111 mg/dL — AB (ref 65–99)
Potassium: 3.4 mmol/L — ABNORMAL LOW (ref 3.5–5.1)
Sodium: 138 mmol/L (ref 135–145)

## 2015-12-28 LAB — APTT: aPTT: 29 seconds (ref 24–36)

## 2015-12-28 LAB — GLUCOSE, CAPILLARY
Glucose-Capillary: 203 mg/dL — ABNORMAL HIGH (ref 65–99)
Glucose-Capillary: 110 mg/dL — ABNORMAL HIGH (ref 65–99)
Glucose-Capillary: 167 mg/dL — ABNORMAL HIGH (ref 65–99)

## 2015-12-28 MED ORDER — DOXAZOSIN MESYLATE 4 MG PO TABS: 4.0000 mg | ORAL_TABLET | Freq: Every day | ORAL | 0 refills | Status: AC

## 2015-12-28 MED ORDER — STROKE: EARLY STAGES OF RECOVERY BOOK: 1.0000 | Freq: Once | 0 refills | Status: AC

## 2015-12-28 MED ORDER — CARVEDILOL 3.125 MG PO TABS: 3.1250 mg | ORAL_TABLET | Freq: Two times a day (BID) | ORAL | 0 refills | Status: AC

## 2015-12-28 MED ORDER — FERROUS SULFATE 325 (65 FE) MG PO TABS: 325.0000 mg | ORAL_TABLET | Freq: Three times a day (TID) | ORAL | 0 refills | Status: AC

## 2015-12-28 MED ORDER — APIXABAN 5 MG PO TABS: 5.0000 mg | ORAL_TABLET | Freq: Two times a day (BID) | ORAL | 0 refills | Status: AC

## 2015-12-28 NOTE — Clinical Social Work Placement (Signed)
   CLINICAL SOCIAL WORK PLACEMENT  NOTE  Date:  12/28/2015  Patient Details  Name: Heidi Campbell MRN: [id number] Date of Birth: 1945/07/23  Clinical Social Work is seeking post-discharge placement for this patient at the Skilled  Nursing Facility level of care (*CSW will initial, date and re-position this form in  chart as items are completed):  Yes   Patient/family provided with [facility] Clinical Social Work Department's list of facilities offering this level of care within the geographic area requested by the patient (or if unable, by the patient's family).  Yes   Patient/family informed of their freedom to choose among providers that offer the needed level of care, that participate in Medicare, Medicaid or managed care program needed by the patient, have an available bed and are willing to accept the patient.  No   Patient/family informed of [facility]'s ownership interest in [facility] and [facility], as well as of the fact that they are under no obligation to receive care at these facilities.  PASRR submitted to EDS on       PASRR number received on       Existing PASRR number confirmed on 12/24/15     FL2 transmitted to all facilities in geographic area requested by pt/family on 12/24/15     FL2 transmitted to all facilities within larger geographic area on 12/24/15     Patient informed that his/her managed care company has contracts with or will negotiate with certain facilities, including the following:        Yes   Patient/family informed of bed offers received.  [facility]     Physician recommends and patient chooses bed at      [facility].  Patient to be transferred to facility by [id number]     Patient family notified on 12/28/15 of transfer.  Name of family member notified:  Dtr      PHYSICIAN       Additional Comment:    _______________________________________________ Norlene DuelBROWN,  Hurman Ketelsen B, LCSWA 12/28/2015, 1:06 PM

## 2015-12-28 NOTE — Progress Notes (Signed)
CSW notified that pt is ready to DC. [facility] [place] [facility] ok for [facility] to come back today. CSW called [facility] to schedule transport and notified family; dtr Almira Coasterdonica Seelye @ [phone]. All transport forms placed on pt's chart with rpt#. Pt has no other needs at this time.  CSW is signing off.  Ezrie Bunyan B. Gean QuintBrown,MSW, LCSWA Clinical Social Work Dept Weekend Social Worker [phone] 12:57 PM

## 2015-12-28 NOTE — Discharge Summary (Addendum)
Physician Discharge Summary  Heidi Campbell ZOX:[id number] DOB: April 03, 1945 DOA: 12/17/2015  PCP: Astrid Divine, MD  Admit date: 12/17/2015 Discharge date: 12/28/2015  Admitted From:  Home  Disposition:  SNF   Recommendations for Outpatient Follow-up:  1. Follow up with PCP in 1- week 2. Please obtain BMP/CBC in one week 3. 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 (card given for 30 days supply, for further treatment will need pre-authorization from insurance company) 4. Need follow up with cardiology for stress test and outpatient loop recording. 5. Follow up with vascular surgery after cardiology preoperative risk stratification.   Home Health: NA  Equipment/Devices: NA  Discharge Condition: Stable  CODE STATUS: Full   Diet recommendation: Heart Healthy / Carb Modified  Brief/Interim Summary: This is a 70 year old female who presented to hospital with chief complaint of chest pain. The pain was precordial, constant, 7 out of 10 in intensity, pressure-like in nature, non-radiating, no improving or worsening factors. Patient had a recent admission (111/03/17) for acute kidney injury with hyperkalemia, required hemodialysis. In October 2017 underwent unaccompanied L3 decompression, for degenerative spondylolisthesis. On initial physical examination, she was awake and alert, blood pressure 145/60, heart rate 70, respiratory rate 14, oxygen saturation 98%. Her lungs were clear to auscultation bilaterally, heart S1-S2 present and rhythmic, her abdomen was soft nontender, lower extremities no edema. Sodium 136, potassium 4.0, chloride 109, BUN 30, creatinine 2.63, glucose 320. White count 11.0, hemoglobin 8.9, hematocrit 27.9, platelets 51. Her chest x-ray was negative for infiltrates. VQ scan was low probability for pulmonary embolism. Her EKG was sinus rhythm with premature atrial complex. Her troponin was 0.51.   The patient was admitted to  the hospital with the working diagnosis of non-ST elevation myocardial infraction, complicated by thrombocytopenia.  1. Non-ST elevation myocardial infarction. The patient was placed on IV unfractionated heparin, continued on statin therapy and aspirin. Troponins were trended with a peak value of 1.24. Echocardiogram show normal left ventricular systolic function with ejection fraction 60- 65%. No wall motion abnormalities. Patient is known to have CAD with 50% LAD stenosis by catheterization 2012. Due to worsening thrombocytopenia hit was considered and patient was changed to argatroban. Patient remained chest pain-free. No cardiac catheterization was performed due to low GFR. Patient will need a stress test as an outpatient. Patient had episodes of V. tach, she was observed in intensive care unit, electrolytes were corrected, per cardiology evaluation this was possibly an artifact. Asprin has been held.   2. Transitory ischemic attack. During her hospitalization patient developed an acute focal neurological deficit,  4 out of 5 left leg and arm weakness. Stat brain imaging negative for acute ischemia. MRI MRA negative for ischemic changes. Noted incidental partially empty sella turcica. Ultrasonography of the carotids showed 80-99% ICA stenosis. Patient will need a preoperative cardiac risk stratification before intervention on her left carotid per vascular surgery recommendations.  3. Heparin-induced thrombocytopenia. Very high pretest probability for heparin-induced thrombocytopenia patient was placed on argatroban and then transitioned to apixaban. Case was discussed with hematology over the phone. In case of thrombosis duration therapy indicated is 3 months and with no thrombosis 4-6 weeks. In this case patient had a non-ST elevation myocardial infarction and a TIA, probably will benefit for 3 months therapy. Ultrasonography of the lower extremities negative for deep vein thrombosis. Late entry:  serotonin release essay positive.   4. Acute kidney injury on chronic kidney disease. Nephrotoxic agents and hypotension  was avoided, patient had trial of gentle fluids with improvement of the kidney function. By the time of discharge her creatinine is 1.5. Recommend close follow-up on kidney function and electrolytes as an outpatient.  5. Type 2 diabetes mellitus. Patient was placed on insulin sliding-scale, glargine 14 units daily and insulin aspart pre-meal. Capillary glucose 112, 110, 167 over last 24 hours.  6. Hypertension. Patient was continued on hydralazine and amlodipine, coreg was added.  7. Iron deficiency anemia combined with anemia chronic disease. Patient received iron supplements and darbepoetin.  8. Obstipation with fever. Patient had aggressive bowel regimen with successful bowel movements.  Discharge Diagnoses:  Principal Problem:   NSTEMI (non-ST elevated myocardial infarction) (HCC) Active Problems:   Type 2 diabetes mellitus treated with insulin (HCC)   Esophageal reflux   Essential hypertension   Coronary atherosclerosis of native coronary artery   Other hyperlipidemia   Atypical chest pain   Normocytic anemia   Thrombocytopenia (HCC)   Elevated troponin   Lethargy   HIT (heparin-induced thrombocytopenia) (HCC)   Transient cerebral ischemia   Hyperglycemia   Stenosis of left carotid artery   Cardiac arrhythmia   Cerebrovascular accident (CVA) [facility])    Discharge Instructions  Discharge Instructions    Diet - low sodium heart healthy    Complete by:  As directed    Discharge instructions    Complete by:  As directed    Please follow with cardiology for out patient stress test. Need follow up with vascular surgery.   Increase activity slowly    Complete by:  As directed        Medication List    STOP taking these medications   potassium chloride 10 MEQ tablet Commonly known as:  K-DUR     TAKE these medications    stroke: mapping our early  stages of recovery book Misc 1 each by Does not apply route once.   acetaminophen 325 MG tablet Commonly known as:  TYLENOL Take 325 mg by mouth 3 (three) times daily.   albuterol 108 (90 Base) MCG/ACT inhaler Commonly known as:  PROVENTIL HFA;VENTOLIN HFA Inhale 2 puffs into the lungs every 6 (six) hours as needed for wheezing or shortness of breath.   amLODipine 10 MG tablet Commonly known as:  NORVASC Take 1 tablet (10 mg total) by mouth daily.   apixaban 5 MG Tabs tablet Commonly known as:  ELIQUIS Take 1 tablet (5 mg total) by mouth 2 (two) times daily.   bisacodyl 10 MG suppository Commonly known as:  DULCOLAX Place 10 mg rectally every 4 (four) hours as needed for moderate constipation.   carvedilol 3.125 MG tablet Commonly known as:  COREG Take 1 tablet (3.125 mg total) by mouth 2 (two) times daily with a meal.   doxazosin 4 MG tablet Commonly known as:  CARDURA Take 1 tablet (4 mg total) by mouth at bedtime.   ezetimibe 10 MG tablet Commonly known as:  ZETIA Take 1 tablet (10 mg total) by mouth daily. What changed:  when to take this   feeding supplement (PRO-STAT SUGAR FREE 64) Liqd Take 30 mLs by mouth 2 (two) times daily.   ferrous sulfate 325 (65 FE) MG tablet Take 1 tablet (325 mg total) by mouth 3 (three) times daily with meals.   fluticasone 50 MCG/ACT nasal spray Commonly known as:  FLONASE Place 1 spray into both nostrils daily as needed for allergies.   Fluticasone-Salmeterol 100-50 MCG/DOSE Aepb Commonly known as:  ADVAIR Inhale 1  puff into the lungs 2 (two) times daily.   gabapentin 400 MG capsule Commonly known as:  NEURONTIN Take 400 mg by mouth 3 (three) times daily.   hydrALAZINE 50 MG tablet Commonly known as:  APRESOLINE Take 1 tablet by mouth 3 times a day. What changed:  how much to take  how to take this  when to take this  additional instructions   Insulin Glargine 100 UNIT/ML Solostar Pen Commonly known as:   LANTUS Inject 10 Units into the skin daily at 10 pm.   nitroGLYCERIN 0.4 MG SL tablet Commonly known as:  NITROSTAT Place 0.4 mg under the tongue every 5 (five) minutes as needed for chest pain.   NOVOLOG 100 UNIT/ML injection Generic drug:  insulin aspart Inject 10-23 Units into the skin See admin instructions. Takes 10 units twice daily, but if CBG 300-350 give addt'l 5 units and >350 give addt'l 8 units   omeprazole 20 MG capsule Commonly known as:  PRILOSEC Take 20 mg by mouth daily at 6 (six) AM.   ondansetron 4 MG disintegrating tablet Commonly known as:  ZOFRAN ODT Take 1 tablet (4 mg total) by mouth every 8 (eight) hours as needed for nausea or vomiting.   polyethylene glycol packet Commonly known as:  MIRALAX / GLYCOLAX Take 17 g by mouth daily as needed (For constipation.).   rosuvastatin 40 MG tablet Commonly known as:  CRESTOR Take 1 tablet (40 mg total) by mouth daily. What changed:  when to take this       Contact information for follow-up providers    Astrid DivineGRIFFIN,ELAINE COLLINS, MD Follow up in 1 week(s).   Specialty:  Family Medicine Contact information: [address] [place] [address] [phone]            Contact information for after-discharge care    Destination    [facility] Follow up.   Specialty:  Skilled Nursing Facility Contact information: [address] [place] [id number] [phone]                 Allergies  Allergen Reactions  . 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 Ace Inhibitors Cough  . Ampicillin Other (See Comments)    Sensitive per lab paperwork from facility  . Cefazolin Other (See Comments)    Sensitive per lab paperwork from facility  . Cefepime Other (See Comments)    Sensitive per lab paperwork from facility  . Ceftazidime Other (See Comments)    Sensitive per lab paperwork from facility  . Ceftriaxone Other (See  Comments)    Sensitive per lab paperwork from facility  . Ciprofloxacin Other (See Comments)    Sensitive per lab paperwork from facility  . Contrast Media [Iodinated Diagnostic Agents] Other (See Comments)    Reaction unknown  . Ertapenem Other (See Comments)    Sensitive per lab paperwork from facility  . Gentamicin Other (See Comments)    Sensitive per lab paperwork from facility  . Imipenem Other (See Comments)    Sensitive per lab paperwork from facility  . Latex Other (See Comments)    Unknown reaction per MAR   . Levofloxacin Other (See Comments)    Sensitive per lab paperwork from facility  . Nitrofuran Derivatives Other (See Comments)    Sensitive per lab paperwork from facility  . Shellfish Allergy Other (See Comments)    Pt cannot recall at this time  . Tobramycin Other (  See Comments)    Sensitive per lab paperwork from facility  . Zosyn [Piperacillin Sod-Tazobactam So] Other (See Comments)    Sensitive per lab paperwork from facility    Consultations:  Cardiology  Neurology  Vascular surgery  Over the phone hematology   Procedures/Studies: Dg Chest 2 View  Result Date: 12/17/2015 CLINICAL DATA:  Pt reports dull mid-sternal chest pains that increases with breathing and slight SOB. EXAM: CHEST - 2 VIEW COMPARISON:  12/06/2015 FINDINGS: Lungs are clear. Heart size and mediastinal contours are within normal limits. Atheromatous aorta. No effusion. Lumbar fusion hardware partially visualized. IMPRESSION: No acute cardiopulmonary disease. Electronically Signed   By: Corlis Leak M.D.   On: 12/17/2015 17:05   Dg Tibia/fibula Left  Result Date: 12/23/2015 CLINICAL DATA:  Recent fall with left lower extremity pain, initial encounter EXAM: LEFT TIBIA AND FIBULA - 2 VIEW COMPARISON:  None. FINDINGS: Degenerative changes about the knee joint are noted. No acute fracture or dislocation is seen. No gross soft tissue abnormality is noted. IMPRESSION: No acute abnormality  noted. Electronically Signed   By: Alcide Clever M.D.   On: 12/23/2015 16:07   Ct Head Wo Contrast  Result Date: 12/19/2015 CLINICAL DATA:  Lethargy EXAM: CT HEAD WITHOUT CONTRAST TECHNIQUE: Contiguous axial images were obtained from the base of the skull through the vertex without intravenous contrast. COMPARISON:  Head CT 07/26/2014 FINDINGS: Brain: No mass lesion, intraparenchymal hemorrhage or extra-axial collection. No evidence of acute cortical infarct. Brain parenchyma and CSF-containing spaces are normal for age. Partially empty sella is again noted. Vascular: No hyperdense vessel or unexpected calcification. Skull: Normal visualized skull base, calvarium and extracranial soft tissues. Sinuses/Orbits: No sinus fluid levels or advanced mucosal thickening. No mastoid effusion. Normal orbits. IMPRESSION: Normal head CT for age. Electronically Signed   By: Deatra Robinson M.D.   On: 12/19/2015 22:50   Mr Shirlee Latch UJ Contrast  Result Date: 12/24/2015 CLINICAL DATA:  70 y/o  F; sudden onset left-sided weakness. EXAM: MRI HEAD WITHOUT CONTRAST MRA HEAD WITHOUT CONTRAST TECHNIQUE: Multiplanar, multiecho pulse sequences of the brain and surrounding structures were obtained without intravenous contrast. Angiographic images of the head were obtained using MRA technique without contrast. COMPARISON:  12/24/2015 CT head. FINDINGS: MRI HEAD FINDINGS Brain: No acute infarction, hemorrhage, hydrocephalus, extra-axial collection or mass lesion. Partially empty sella turcica. Diffusely increased susceptibility hypoenhancing throughout sulci on the of study white sequences probably due to iron supplementation. No abnormal enhancement of the brain parenchyma. Few nonspecific foci of T2 FLAIR hyperintensity in subcortical and periventricular white matter are compatible mild chronic microvascular ischemic changes. Mild parenchymal volume loss. Partially empty sella turcica. Vascular: See below. Skull and upper cervical  spine: Normal marrow signal. Sinuses/Orbits: Negative. Other: None. MRA HEAD FINDINGS Internal carotid arteries:  Patent. Anterior cerebral arteries:  Patent. Middle cerebral arteries: Patent. Anterior communicating artery: Not identified, hypoplastic or absent. Posterior communicating arteries: Fetal right posterior cerebral artery. Diminutive left posterior communicating artery. Posterior cerebral arteries:  Patent. Basilar artery:  Lower basilar fenestration. Vertebral arteries:  Patent. No evidence of high-grade stenosis, large vessel occlusion, or aneurysm. IMPRESSION: 1. No acute intracranial abnormality identified. 2. Mild chronic microvascular ischemic changes and parenchymal volume loss of the brain. 3. Diffuse sulcal susceptibility hypointensity is probably related to iron supplementation. 4. Partially empty sella turcica. 5. No high-grade stenosis, large vessel occlusion or aneurysm of the circle of Willis. Electronically Signed   By: Mitzi Hansen M.D.   On: 12/24/2015 23:09  Mr Brain 15 Contrast  Result Date: 12/24/2015 CLINICAL DATA:  70 y/o  F; sudden onset left-sided weakness. EXAM: MRI HEAD WITHOUT CONTRAST MRA HEAD WITHOUT CONTRAST TECHNIQUE: Multiplanar, multiecho pulse sequences of the brain and surrounding structures were obtained without intravenous contrast. Angiographic images of the head were obtained using MRA technique without contrast. COMPARISON:  12/24/2015 CT head. FINDINGS: MRI HEAD FINDINGS Brain: No acute infarction, hemorrhage, hydrocephalus, extra-axial collection or mass lesion. Partially empty sella turcica. Diffusely increased susceptibility hypoenhancing throughout sulci on the of study white sequences probably due to iron supplementation. No abnormal enhancement of the brain parenchyma. Few nonspecific foci of T2 FLAIR hyperintensity in subcortical and periventricular white matter are compatible mild chronic microvascular ischemic changes. Mild parenchymal  volume loss. Partially empty sella turcica. Vascular: See below. Skull and upper cervical spine: Normal marrow signal. Sinuses/Orbits: Negative. Other: None. MRA HEAD FINDINGS Internal carotid arteries:  Patent. Anterior cerebral arteries:  Patent. Middle cerebral arteries: Patent. Anterior communicating artery: Not identified, hypoplastic or absent. Posterior communicating arteries: Fetal right posterior cerebral artery. Diminutive left posterior communicating artery. Posterior cerebral arteries:  Patent. Basilar artery:  Lower basilar fenestration. Vertebral arteries:  Patent. No evidence of high-grade stenosis, large vessel occlusion, or aneurysm. IMPRESSION: 1. No acute intracranial abnormality identified. 2. Mild chronic microvascular ischemic changes and parenchymal volume loss of the brain. 3. Diffuse sulcal susceptibility hypointensity is probably related to iron supplementation. 4. Partially empty sella turcica. 5. No high-grade stenosis, large vessel occlusion or aneurysm of the circle of Willis. Electronically Signed   By: Mitzi Hansen M.D.   On: 12/24/2015 23:09   Mr Lumbar Spine Wo Contrast  Result Date: 12/06/2015 CLINICAL DATA:  Recent lumbar surgery with sepsis. EXAM: MRI LUMBAR SPINE WITHOUT CONTRAST TECHNIQUE: Multiplanar, multisequence MR imaging of the lumbar spine was performed. No intravenous contrast was administered. COMPARISON:  11/23/2015 FINDINGS: Segmentation: 5 lumbar type vertebral bodies. The last full intervertebral disc space is labeled L5-S1. This is consistent with the prior MRI. Alignment:  Normal Vertebrae: Mild stable endplate reactive changes. No findings suspicious for diskitis or osteomyelitis. Conus medullaris: Extends to the T12-L1. Level and appears normal. Paraspinal and other soft tissues: Stable postop fluid collection both superficial and deep to the muscle fascia. This could be a postoperative seroma or liquified hematoma. The it does not have the  appearance of a CSF leak. Disc levels: T12-L1: Stable left-sided disc extrusion coursing both above and below the disc space. Moderate mass effect on the left side of thecal sac is stable. L1-2: No significant findings or interval change. L2-3:  No significant findings or interval change. L3-4: Posterior and interbody fusion changes. Wide decompressive laminectomy. No spinal or foraminal stenosis. Stable postoperative fluid collection as discussed above. L4-5: Stable bulging annulus and advanced facet disease with mild bilateral lateral recess stenosis. No significant spinal or foraminal stenosis. L5-S1: Stable low disc disease and facet disease. No focal disc protrusion, spinal or foraminal stenosis. IMPRESSION: 1. Stable MR appearance of the lumbar spine since the recent examination from 11/23/2015. Specific findings as discussed above. 2. Stable postoperative fluid collection extending from L2-L4. 3. Stable moderate-sized disc extrusion at T12-L1. 4. No definite MR findings to suggest diskitis or osteomyelitis. Electronically Signed   By: Rudie Meyer M.D.   On: 12/06/2015 16:32   US Renal  Result Date: 12/06/2015 CLINICAL DATA:  Low urine output.  Acute renal failure. EXAM: RENAL / URINARY TRACT ULTRASOUND COMPLETE COMPARISON:  CT, 12/04/2011 FINDINGS: Right Kidney: Length: 9.9 cm. Echogenicity  within normal limits. No mass or hydronephrosis visualized. Left Kidney: Length: 9.9 cm. Echogenicity within normal limits. No mass or hydronephrosis visualized. Bladder: Decompressed with a Foley catheter. IMPRESSION: Normal renal ultrasound.  No hydronephrosis. Electronically Signed   By: Amie Portland M.D.   On: 12/06/2015 14:59   Nm Pulmonary Perf And Vent  Result Date: 12/17/2015 CLINICAL DATA:  Chest pain and shortness of breath. EXAM: NUCLEAR MEDICINE VENTILATION - PERFUSION LUNG SCAN TECHNIQUE: Ventilation images were obtained in multiple projections using inhaled aerosol Tc-60m DTPA. Perfusion images  were obtained in multiple projections after intravenous injection of Tc-54m MAA. RADIOPHARMACEUTICALS:  32.9 mCi Technetium-59m DTPA aerosol inhalation and 4.06 mCi Technetium-4m MAA IV COMPARISON:  Chest radiograph earlier this day FINDINGS: Ventilation: No focal ventilation defect. Perfusion: Single small (subsegmental) perfusion defect posterior medial left lower lobe. No additional or wedge shaped peripheral perfusion defects to suggest acute pulmonary embolism. IMPRESSION: Low probability for pulmonary embolus. Electronically Signed   By: Rubye Oaks M.D.   On: 12/17/2015 19:47   Ir Fluoro Guide Cv Line Right  Result Date: 12/19/2015 CLINICAL DATA:  Poor venous access.  Chronic kidney disease. EXAM: RIGHT IJ CENTRAL VENOUS CATHETER PLACEMENT UNDER ULTRASOUND AND FLUOROSCOPIC GUIDANCE FLUOROSCOPY TIME:  Less than 6 seconds TECHNIQUE: The procedure, risks (including but not limited to bleeding, infection, organ damage ), benefits, and alternatives were explained to the patient. Questions regarding the procedure were encouraged and answered. The patient understands and consents to the procedure. Patency of the right IJ vein was confirmed with ultrasound with image documentation. An appropriate skin site was determined. Skin site was marked. Region was prepped using maximum barrier technique including cap and mask, sterile gown, sterile gloves, large sterile sheet, and Chlorhexidine as cutaneous antisepsis. The region was infiltrated locally with 1% lidocaine. Under real-time ultrasound guidance, the right IJ vein was accessed with a 21 gauge micropuncture needle; the needle tip within the vein was confirmed with ultrasound image documentation. The needle exchanged over a guidewire for peel-away sheath through which an 18cm 5 [place] PowerPICC dual lumen catheter was advanced. This was positioned with the tip at the cavoatrial junction. Spot chest radiograph shows good positioning and no pneumothorax.  Catheter was flushed and sutured externally with 0-Prolene sutures. Patient tolerated the procedure well. COMPLICATIONS: None immediate IMPRESSION: 1. Technically successful right IJ double lumen power injectable central venous catheter placement. Electronically Signed   By: Corlis Leak M.D.   On: 12/19/2015 14:05   Ir US Guide Vasc Access Right  Result Date: 12/19/2015 CLINICAL DATA:  Poor venous access.  Chronic kidney disease. EXAM: RIGHT IJ CENTRAL VENOUS CATHETER PLACEMENT UNDER ULTRASOUND AND FLUOROSCOPIC GUIDANCE FLUOROSCOPY TIME:  Less than 6 seconds TECHNIQUE: The procedure, risks (including but not limited to bleeding, infection, organ damage ), benefits, and alternatives were explained to the patient. Questions regarding the procedure were encouraged and answered. The patient understands and consents to the procedure. Patency of the right IJ vein was confirmed with ultrasound with image documentation. An appropriate skin site was determined. Skin site was marked. Region was prepped using maximum barrier technique including cap and mask, sterile gown, sterile gloves, large sterile sheet, and Chlorhexidine as cutaneous antisepsis. The region was infiltrated locally with 1% lidocaine. Under real-time ultrasound guidance, the right IJ vein was accessed with a 21 gauge micropuncture needle; the needle tip within the vein was confirmed with ultrasound image documentation. The needle exchanged over a guidewire for peel-away sheath through which an 18cm 5 [place] PowerPICC dual  lumen catheter was advanced. This was positioned with the tip at the cavoatrial junction. Spot chest radiograph shows good positioning and no pneumothorax. Catheter was flushed and sutured externally with 0-Prolene sutures. Patient tolerated the procedure well. COMPLICATIONS: None immediate IMPRESSION: 1. Technically successful right IJ double lumen power injectable central venous catheter placement. Electronically Signed   By: Corlis Leak   Hassell M.D.   On: 12/19/2015 14:05   Dg Chest Port 1 View  Result Date: 12/06/2015 CLINICAL DATA:  Central line placement.  Initial encounter. EXAM: PORTABLE CHEST 1 VIEW COMPARISON:  Chest radiograph performed 12/05/2015 FINDINGS: The patient's right-sided dual-lumen catheter is noted ending at the right cavoatrial junction. Mild vascular congestion is noted. Mild bibasilar atelectasis is seen. No pleural effusion or pneumothorax identified. The cardiomediastinal silhouette is mildly enlarged. No acute osseous abnormalities are identified. IMPRESSION: 1. Right-sided dual-lumen catheter noted ending about the right cavoatrial junction. 2. Mild vascular congestion and mild cardiomegaly. Mild bibasilar atelectasis seen. Electronically Signed   By: Roanna RaiderJeffery  Chang M.D.   On: 12/06/2015 02:37   Dg Chest Port 1 View  Result Date: 12/05/2015 CLINICAL DATA:  Unresponsive, hypotensive, bradycardia, history asthma, hypertension, diabetes mellitus EXAM: PORTABLE CHEST 1 VIEW COMPARISON:  Portable exam 1451 hours compared to 11/24/2015 FINDINGS: Enlargement of cardiac silhouette. Atherosclerotic calcification aorta. Mediastinal contours and pulmonary vascularity normal. Mild RIGHT basilar atelectasis. Lungs otherwise clear. No pleural effusion or pneumothorax. IMPRESSION: Enlargement of cardiac silhouette with mild RIGHT basilar atelectasis. Aortic atherosclerosis. Electronically Signed   By: Ulyses SouthwardMark  Boles M.D.   On: 12/05/2015 15:49   Dg Knee Complete 4 Views Left  Result Date: 12/23/2015 CLINICAL DATA:  Knee pain following recent fall, initial encounter EXAM: LEFT KNEE - COMPLETE 4+ VIEW COMPARISON:  11/24/2015 FINDINGS: Tricompartmental degenerative changes are noted most marked in the medial joint space stable from the prior exam. No joint effusion is seen. No acute fracture or dislocation is noted. IMPRESSION: Degenerative change without acute abnormality. Electronically Signed   By: Alcide CleverMark  Lukens M.D.   On:  12/23/2015 16:13   Dg Femur Min 2 Views Left  Result Date: 12/23/2015 CLINICAL DATA:  Recent fall with left leg pain, initial encounter EXAM: LEFT FEMUR 2 VIEWS COMPARISON:  None. FINDINGS: No acute fracture or dislocation is noted. Degenerative changes are noted about the knee joint. No soft tissue abnormality is seen. IMPRESSION: No acute abnormality noted. Electronically Signed   By: Alcide CleverMark  Lukens M.D.   On: 12/23/2015 16:07   Ct Head Code Stroke Wo Contrast  Result Date: 12/24/2015 CLINICAL DATA:  Code stroke. EXAM: CT HEAD WITHOUT CONTRAST TECHNIQUE: Contiguous axial images were obtained from the base of the skull through the vertex without intravenous contrast. COMPARISON:  None. FINDINGS: Brain: No mass lesion, intraparenchymal hemorrhage or extra-axial collection. No evidence of acute cortical infarct. There is periventricular hypoattenuation compatible with chronic microvascular disease. Vascular: No hyperdense vessel or unexpected calcification. Skull: Normal visualized skull base, calvarium and extracranial soft tissues. Sinuses/Orbits: No sinus fluid levels or advanced mucosal thickening. No mastoid effusion. Normal orbits. ASPECTS [facility]([place] Stroke Program Early CT Score) - Ganglionic level infarction (caudate, lentiform nuclei, internal capsule, insula, M1-M3 cortex): 7 - Supraganglionic infarction (M4-M6 cortex): 3 Total score (0-10 with 10 being normal): 10 IMPRESSION: 1. No acute intracranial abnormality. 2. ASPECTS is 10. These results were called by telephone at the time of interpretation on 12/24/2015 at 9:57 pm to Dr. Ritta SlotMCNEILL KIRKPATRICK , who verbally acknowledged these results. Electronically Signed   By: Chrisandra NettersKevin  Herman M.D.  On: 12/24/2015 21:57      Subjective: Patient had bowel movement, feeling better, no nausea or vomiting.   Discharge Exam: Vitals:   12/28/15 0357 12/28/15 0500  BP: (!) 129/59 (!) 129/59  Pulse:  73  Resp: 15 18  Temp:  100.3 F (37.9 C)   Vitals:    12/27/15 1530 12/27/15 2130 12/28/15 0357 12/28/15 0500  BP: (!) 124/57 131/65 (!) 129/59 (!) 129/59  Pulse: 72 71  73  Resp:  18 15 18   Temp: 99.5 F (37.5 C) 99.1 F (37.3 C)  100.3 F (37.9 C)  TempSrc: Oral     SpO2: 97% 98%  98%  Weight:    102.2 kg (225 lb 4.8 oz)  Height:        General: Pt is alert, awake, not in acute distress Cardiovascular: RRR, S1/S2 +, no rubs, no gallops Respiratory: CTA bilaterally, no wheezing, no rhonchi Abdominal: Soft, NT, ND, bowel sounds + Extremities: trace edema, no cyanosis    The results of significant diagnostics from this hospitalization (including imaging, microbiology, ancillary and laboratory) are listed below for reference.     Microbiology: Recent Results (from the past 240 hour(s))  Culture, Urine     Status: Abnormal   Collection Time: 12/19/15  8:01 PM  Result Value Ref Range Status   Specimen Description URINE, RANDOM  Final   Special Requests NONE  Final   Culture MULTIPLE SPECIES PRESENT, SUGGEST RECOLLECTION (A)  Final   Report Status 12/21/2015 FINAL  Final  Culture, blood (routine x 2)     Status: None   Collection Time: 12/19/15  9:02 PM  Result Value Ref Range Status   Specimen Description BLOOD RIGHT HAND  Final   Special Requests IN PEDIATRIC BOTTLE 1CC  Final   Culture NO GROWTH 5 DAYS  Final   Report Status 12/24/2015 FINAL  Final  Culture, blood (routine x 2)     Status: None   Collection Time: 12/19/15  9:09 PM  Result Value Ref Range Status   Specimen Description BLOOD LEFT HAND  Final   Special Requests IN PEDIATRIC BOTTLE 3CC  Final   Culture NO GROWTH 5 DAYS  Final   Report Status 12/24/2015 FINAL  Final     Labs: BNP (last 3 results)  Recent Labs  12/06/15 0042  BNP 187.8*   Basic Metabolic Panel:  Recent Labs Lab 12/22/15 0632  12/24/15 0618 12/25/15 0541 12/26/15 0445 12/27/15 0535 12/28/15 0550  NA 141  < > 141 139 139 138 138  K 3.6  < > 4.0 4.0 3.5 3.5 3.4*  CL 115*  <  > 112* 109 109 108 107  CO2 20*  < > 23 22 22 23 22   GLUCOSE 135*  < > 160* 169* 158* 135* 111*  BUN 18  < > 21* 25* 23* 26* 26*  CREATININE 1.52*  < > 1.40* 1.63* 1.35* 1.45* 1.51*  CALCIUM 8.7*  < > 9.2 9.5 8.6* 8.7* 8.5*  PHOS 2.2*  --  2.5  --   --   --   --   < > = values in this interval not displayed. Liver Function Tests:  Recent Labs Lab 12/22/15 1610 12/24/15 0618  ALBUMIN 2.4* 2.5*   No results for input(s): LIPASE, AMYLASE in the last 168 hours. No results for input(s): AMMONIA in the last 168 hours. CBC:  Recent Labs Lab 12/24/15 0619 12/25/15 0541 12/26/15 0445 12/27/15 0535 12/28/15 0550  WBC 10.6* 14.5* 12.0*  12.2* 10.7*  HGB 10.7* 11.3* 10.4* 10.5* 10.6*  HCT 33.0* 34.7* 32.5* 32.4* 32.6*  MCV 84.6 85.9 86.7 86.4 86.0  PLT 154 178 179 189 195   Cardiac Enzymes: No results for input(s): CKTOTAL, CKMB, CKMBINDEX, TROPONINI in the last 168 hours. BNP: Invalid input(s): POCBNP CBG:  Recent Labs Lab 12/27/15 1117 12/27/15 1654 12/27/15 2107 12/28/15 0748 12/28/15 1121  GLUCAP 203* 204* 112* 110* 167*   D-Dimer No results for input(s): DDIMER in the last 72 hours. Hgb A1c No results for input(s): HGBA1C in the last 72 hours. Lipid Profile No results for input(s): CHOL, HDL, LDLCALC, TRIG, CHOLHDL, LDLDIRECT in the last 72 hours. Thyroid function studies No results for input(s): TSH, T4TOTAL, T3FREE, THYROIDAB in the last 72 hours.  Invalid input(s): FREET3 Anemia work up No results for input(s): VITAMINB12, FOLATE, FERRITIN, TIBC, IRON, RETICCTPCT in the last 72 hours. Urinalysis    Component Value Date/Time   COLORURINE YELLOW 12/25/2015 1402   APPEARANCEUR CLOUDY (A) 12/25/2015 1402   LABSPEC 1.017 12/25/2015 1402   PHURINE 6.0 12/25/2015 1402   GLUCOSEU >1000 (A) 12/25/2015 1402   GLUCOSEU NEGATIVE 07/03/2013 0856   HGBUR LARGE (A) 12/25/2015 1402   BILIRUBINUR NEGATIVE 12/25/2015 1402   BILIRUBINUR Neg 08/06/2014 1058   KETONESUR  NEGATIVE 12/25/2015 1402   PROTEINUR 100 (A) 12/25/2015 1402   UROBILINOGEN 0.2 08/06/2014 1058   UROBILINOGEN 1.0 07/26/2014 1804   NITRITE NEGATIVE 12/25/2015 1402   LEUKOCYTESUR LARGE (A) 12/25/2015 1402   Sepsis Labs Invalid input(s): PROCALCITONIN,  WBC,  LACTICIDVEN Microbiology Recent Results (from the past 240 hour(s))  Culture, Urine     Status: Abnormal   Collection Time: 12/19/15  8:01 PM  Result Value Ref Range Status   Specimen Description URINE, RANDOM  Final   Special Requests NONE  Final   Culture MULTIPLE SPECIES PRESENT, SUGGEST RECOLLECTION (A)  Final   Report Status 12/21/2015 FINAL  Final  Culture, blood (routine x 2)     Status: None   Collection Time: 12/19/15  9:02 PM  Result Value Ref Range Status   Specimen Description BLOOD RIGHT HAND  Final   Special Requests IN PEDIATRIC BOTTLE 1CC  Final   Culture NO GROWTH 5 DAYS  Final   Report Status 12/24/2015 FINAL  Final  Culture, blood (routine x 2)     Status: None   Collection Time: 12/19/15  9:09 PM  Result Value Ref Range Status   Specimen Description BLOOD LEFT HAND  Final   Special Requests IN PEDIATRIC BOTTLE 3CC  Final   Culture NO GROWTH 5 DAYS  Final   Report Status 12/24/2015 FINAL  Final     Time coordinating discharge: 45 minutes  SIGNED:   Coralie Keens, MD  [facility] 12/28/2015, 12:05 PM Pager   If 7PM-7AM, please contact night-coverage [email or web address] Password [email or web address]

## 2015-12-29 ENCOUNTER — Encounter: Payer: Self-pay | Admitting: Rehabilitation

## 2015-12-29 NOTE — Therapy (Signed)
[facility] [address] [place], [place], [address] Phone: [phone]   Fax:  [phone]  Patient Details  Name: Heidi Campbell MRN: [id number] Date of Birth: 01-20-1946 Referring Provider:  No ref. provider found  Encounter Date: 12/29/2015    PHYSICAL THERAPY DISCHARGE SUMMARY  Visits from Start of Care: 7  Current functional level related to goals / functional outcomes: PT placed on hold due to severe back pain.  Note that she is s/p back surgery, readmit to ED for NSTEMI and is now at [facility] for therapy.     Remaining deficits:     PT Long Term Goals - 08/25/15 1524      PT LONG TERM GOAL #1   Title Pt will be independent with HEP in order to indicate improved functional mobility.  (Target Date: 10/06/15)   Status New     PT LONG TERM GOAL #2   Title Pt will score 22/24 on DGI in order to indicate decreased fall risk.     Status New     PT LONG TERM GOAL #3   Title Pt will report no more than 4/10 pain in low back in order to indicate pain decreased limiting factor in mobility.     Status New     PT LONG TERM GOAL #4   Title Pt will improve ABC score to >70% in order to indicate improved pt confidence in balance.      Status New        Education / Equipment: HEP  Plan: Patient agrees to discharge.  Patient goals were not met. Patient is being discharged due to a change in medical status.  ?????        Cameron Sprang, PT, MPT [facility] [address] [place], [place], [address] Phone: [phone]   Fax:  [phone] 12/29/15, 1:42 PM

## 2016-01-03 LAB — SEROTONIN RELEASE ASSAY (SRA)
SRA 100IU/mL UFH Ser-aCnc: 0 % (ref 0–20)
SRA, LOW DOSE HEPARIN: 88 % — AB (ref 0–20)

## 2016-01-11 ENCOUNTER — Encounter (HOSPITAL_COMMUNITY): Payer: Self-pay | Admitting: Internal Medicine

## 2016-01-11 ENCOUNTER — Inpatient Hospital Stay (HOSPITAL_COMMUNITY)
Admission: EM | Admit: 2016-01-11 | Discharge: 2016-02-02 | DRG: 682 | Disposition: E | Payer: Medicare Other | Attending: Internal Medicine | Admitting: Internal Medicine

## 2016-01-11 ENCOUNTER — Emergency Department (HOSPITAL_COMMUNITY): Payer: Medicare Other

## 2016-01-11 DIAGNOSIS — A419 Sepsis, unspecified organism: Secondary | ICD-10-CM | POA: Clinically undetermined

## 2016-01-11 DIAGNOSIS — G9341 Metabolic encephalopathy: Secondary | ICD-10-CM | POA: Diagnosis present

## 2016-01-11 DIAGNOSIS — E872 Acidosis: Secondary | ICD-10-CM | POA: Diagnosis present

## 2016-01-11 DIAGNOSIS — J9601 Acute respiratory failure with hypoxia: Secondary | ICD-10-CM | POA: Diagnosis not present

## 2016-01-11 DIAGNOSIS — R339 Retention of urine, unspecified: Secondary | ICD-10-CM

## 2016-01-11 DIAGNOSIS — N170 Acute kidney failure with tubular necrosis: Secondary | ICD-10-CM | POA: Diagnosis present

## 2016-01-11 DIAGNOSIS — I214 Non-ST elevation (NSTEMI) myocardial infarction: Secondary | ICD-10-CM | POA: Diagnosis present

## 2016-01-11 DIAGNOSIS — R7989 Other specified abnormal findings of blood chemistry: Secondary | ICD-10-CM

## 2016-01-11 DIAGNOSIS — D689 Coagulation defect, unspecified: Secondary | ICD-10-CM | POA: Diagnosis present

## 2016-01-11 DIAGNOSIS — B3741 Candidal cystitis and urethritis: Secondary | ICD-10-CM | POA: Diagnosis present

## 2016-01-11 DIAGNOSIS — Z66 Do not resuscitate: Secondary | ICD-10-CM | POA: Diagnosis not present

## 2016-01-11 DIAGNOSIS — I2510 Atherosclerotic heart disease of native coronary artery without angina pectoris: Secondary | ICD-10-CM | POA: Diagnosis present

## 2016-01-11 DIAGNOSIS — R6521 Severe sepsis with septic shock: Secondary | ICD-10-CM | POA: Diagnosis not present

## 2016-01-11 DIAGNOSIS — N184 Chronic kidney disease, stage 4 (severe): Secondary | ICD-10-CM | POA: Diagnosis present

## 2016-01-11 DIAGNOSIS — R6520 Severe sepsis without septic shock: Secondary | ICD-10-CM | POA: Clinically undetermined

## 2016-01-11 DIAGNOSIS — I5032 Chronic diastolic (congestive) heart failure: Secondary | ICD-10-CM | POA: Diagnosis present

## 2016-01-11 DIAGNOSIS — I10 Essential (primary) hypertension: Secondary | ICD-10-CM

## 2016-01-11 DIAGNOSIS — Z91041 Radiographic dye allergy status: Secondary | ICD-10-CM

## 2016-01-11 DIAGNOSIS — Z888 Allergy status to other drugs, medicaments and biological substances status: Secondary | ICD-10-CM | POA: Diagnosis not present

## 2016-01-11 DIAGNOSIS — R338 Other retention of urine: Secondary | ICD-10-CM

## 2016-01-11 DIAGNOSIS — Z7901 Long term (current) use of anticoagulants: Secondary | ICD-10-CM

## 2016-01-11 DIAGNOSIS — Z794 Long term (current) use of insulin: Secondary | ICD-10-CM

## 2016-01-11 DIAGNOSIS — Z515 Encounter for palliative care: Secondary | ICD-10-CM

## 2016-01-11 DIAGNOSIS — I130 Hypertensive heart and chronic kidney disease with heart failure and stage 1 through stage 4 chronic kidney disease, or unspecified chronic kidney disease: Secondary | ICD-10-CM | POA: Diagnosis present

## 2016-01-11 DIAGNOSIS — D7582 Heparin induced thrombocytopenia (HIT): Secondary | ICD-10-CM

## 2016-01-11 DIAGNOSIS — Z91013 Allergy to seafood: Secondary | ICD-10-CM

## 2016-01-11 DIAGNOSIS — I272 Pulmonary hypertension, unspecified: Secondary | ICD-10-CM | POA: Diagnosis present

## 2016-01-11 DIAGNOSIS — G009 Bacterial meningitis, unspecified: Secondary | ICD-10-CM | POA: Diagnosis not present

## 2016-01-11 DIAGNOSIS — Z6841 Body Mass Index (BMI) 40.0 and over, adult: Secondary | ICD-10-CM | POA: Diagnosis not present

## 2016-01-11 DIAGNOSIS — D75829 Heparin-induced thrombocytopenia, unspecified: Secondary | ICD-10-CM | POA: Diagnosis present

## 2016-01-11 DIAGNOSIS — G9340 Encephalopathy, unspecified: Secondary | ICD-10-CM

## 2016-01-11 DIAGNOSIS — I4891 Unspecified atrial fibrillation: Secondary | ICD-10-CM | POA: Diagnosis present

## 2016-01-11 DIAGNOSIS — I6522 Occlusion and stenosis of left carotid artery: Secondary | ICD-10-CM | POA: Diagnosis present

## 2016-01-11 DIAGNOSIS — N3000 Acute cystitis without hematuria: Secondary | ICD-10-CM | POA: Diagnosis present

## 2016-01-11 DIAGNOSIS — E875 Hyperkalemia: Secondary | ICD-10-CM | POA: Diagnosis present

## 2016-01-11 DIAGNOSIS — Z8673 Personal history of transient ischemic attack (TIA), and cerebral infarction without residual deficits: Secondary | ICD-10-CM

## 2016-01-11 DIAGNOSIS — Z8249 Family history of ischemic heart disease and other diseases of the circulatory system: Secondary | ICD-10-CM

## 2016-01-11 DIAGNOSIS — Z833 Family history of diabetes mellitus: Secondary | ICD-10-CM

## 2016-01-11 DIAGNOSIS — Z91040 Latex allergy status: Secondary | ICD-10-CM

## 2016-01-11 DIAGNOSIS — Z880 Allergy status to penicillin: Secondary | ICD-10-CM

## 2016-01-11 DIAGNOSIS — E1122 Type 2 diabetes mellitus with diabetic chronic kidney disease: Secondary | ICD-10-CM | POA: Diagnosis present

## 2016-01-11 DIAGNOSIS — N179 Acute kidney failure, unspecified: Secondary | ICD-10-CM | POA: Diagnosis not present

## 2016-01-11 DIAGNOSIS — Z881 Allergy status to other antibiotic agents status: Secondary | ICD-10-CM

## 2016-01-11 DIAGNOSIS — K7201 Acute and subacute hepatic failure with coma: Secondary | ICD-10-CM | POA: Diagnosis not present

## 2016-01-11 DIAGNOSIS — B49 Unspecified mycosis: Secondary | ICD-10-CM | POA: Diagnosis not present

## 2016-01-11 DIAGNOSIS — R0902 Hypoxemia: Secondary | ICD-10-CM

## 2016-01-11 DIAGNOSIS — R945 Abnormal results of liver function studies: Secondary | ICD-10-CM

## 2016-01-11 DIAGNOSIS — E861 Hypovolemia: Secondary | ICD-10-CM | POA: Diagnosis present

## 2016-01-11 DIAGNOSIS — R4182 Altered mental status, unspecified: Secondary | ICD-10-CM

## 2016-01-11 DIAGNOSIS — E119 Type 2 diabetes mellitus without complications: Secondary | ICD-10-CM

## 2016-01-11 DIAGNOSIS — K760 Fatty (change of) liver, not elsewhere classified: Secondary | ICD-10-CM | POA: Diagnosis present

## 2016-01-11 DIAGNOSIS — R739 Hyperglycemia, unspecified: Secondary | ICD-10-CM

## 2016-01-11 HISTORY — DX: Fatty (change of) liver, not elsewhere classified: K76.0

## 2016-01-11 LAB — CBC
HEMATOCRIT: 35.1 % — AB (ref 36.0–46.0)
Hemoglobin: 11.4 g/dL — ABNORMAL LOW (ref 12.0–15.0)
MCH: 27.7 pg (ref 26.0–34.0)
MCHC: 32.5 g/dL (ref 30.0–36.0)
MCV: 85.4 fL (ref 78.0–100.0)
PLATELETS: 304 10*3/uL (ref 150–400)
RBC: 4.11 MIL/uL (ref 3.87–5.11)
RDW: 15.1 % (ref 11.5–15.5)
WBC: 14.9 10*3/uL — ABNORMAL HIGH (ref 4.0–10.5)

## 2016-01-11 LAB — GLUCOSE, CAPILLARY
GLUCOSE-CAPILLARY: 150 mg/dL — AB (ref 65–99)
GLUCOSE-CAPILLARY: 139 mg/dL — AB (ref 65–99)

## 2016-01-11 LAB — COMPREHENSIVE METABOLIC PANEL
ALK PHOS: 117 U/L (ref 38–126)
ALT: 453 U/L — AB (ref 14–54)
AST: 917 U/L — ABNORMAL HIGH (ref 15–41)
Albumin: 2.3 g/dL — ABNORMAL LOW (ref 3.5–5.0)
Anion gap: 14 (ref 5–15)
BILIRUBIN TOTAL: 0.5 mg/dL (ref 0.3–1.2)
BUN: 149 mg/dL — ABNORMAL HIGH (ref 6–20)
CALCIUM: 8.4 mg/dL — AB (ref 8.9–10.3)
CHLORIDE: 101 mmol/L (ref 101–111)
CO2: 17 mmol/L — ABNORMAL LOW (ref 22–32)
CREATININE: 6.06 mg/dL — AB (ref 0.44–1.00)
GFR, EST AFRICAN AMERICAN: 7 mL/min — AB (ref >60)
GFR, EST NON AFRICAN AMERICAN: 6 mL/min — AB (ref >60)
Glucose, Bld: 167 mg/dL — ABNORMAL HIGH (ref 65–99)
Potassium: 5.5 mmol/L — ABNORMAL HIGH (ref 3.5–5.1)
Sodium: 132 mmol/L — ABNORMAL LOW (ref 135–145)
TOTAL PROTEIN: 6.2 g/dL — AB (ref 6.5–8.1)

## 2016-01-11 LAB — I-STAT ARTERIAL BLOOD GAS, ED
ACID-BASE DEFICIT: 8 mmol/L — AB (ref 0.0–2.0)
BICARBONATE: 16.4 mmol/L — AB (ref 20.0–28.0)
O2 SAT: 97 %
TCO2: 17 mmol/L (ref 0–100)
pCO2 arterial: 28.7 mmHg — ABNORMAL LOW (ref 32.0–48.0)
pH, Arterial: 7.365 (ref 7.350–7.450)
pO2, Arterial: 91 mmHg (ref 83.0–108.0)

## 2016-01-11 LAB — AMMONIA: AMMONIA: 54 umol/L — AB (ref 9–35)

## 2016-01-11 LAB — ETHANOL

## 2016-01-11 MED ORDER — BISACODYL 10 MG RE SUPP: 10.0000 mg | RECTAL | Status: DC | PRN

## 2016-01-11 MED ORDER — ACETAMINOPHEN 325 MG PO TABS: 650.0000 mg | ORAL_TABLET | Freq: Four times a day (QID) | ORAL | Status: DC | PRN

## 2016-01-11 MED ORDER — INSULIN ASPART 100 UNIT/ML ~~LOC~~ SOLN
0.0000 [IU] | Freq: Three times a day (TID) | SUBCUTANEOUS | Status: DC
  Administered 2016-01-14: 1 [IU] via SUBCUTANEOUS
  Administered 2016-01-14: 2 [IU] via SUBCUTANEOUS

## 2016-01-11 MED ORDER — METOPROLOL TARTRATE 5 MG/5ML IV SOLN: 5.0000 mg | Freq: Four times a day (QID) | INTRAVENOUS | Status: DC | PRN

## 2016-01-11 MED ORDER — ACETAMINOPHEN 650 MG RE SUPP: 650.0000 mg | Freq: Four times a day (QID) | RECTAL | Status: DC | PRN

## 2016-01-11 MED ORDER — ONDANSETRON HCL 4 MG/2ML IJ SOLN: 4.0000 mg | Freq: Four times a day (QID) | INTRAMUSCULAR | Status: DC | PRN

## 2016-01-11 MED ORDER — ALBUTEROL SULFATE (2.5 MG/3ML) 0.083% IN NEBU: 2.5000 mg | INHALATION_SOLUTION | RESPIRATORY_TRACT | Status: DC | PRN

## 2016-01-11 MED ORDER — HYDROXYZINE HCL 25 MG PO TABS: 25.0000 mg | ORAL_TABLET | Freq: Three times a day (TID) | ORAL | Status: DC | PRN

## 2016-01-11 MED ORDER — INSULIN GLARGINE 100 UNIT/ML ~~LOC~~ SOLN
10.0000 [IU] | Freq: Every day | SUBCUTANEOUS | Status: DC
  Administered 2016-01-11: 10 [IU] via SUBCUTANEOUS
  Filled 2016-01-11 (×3): qty 0.1

## 2016-01-11 MED ORDER — ONDANSETRON HCL 4 MG PO TABS: 4.0000 mg | ORAL_TABLET | Freq: Four times a day (QID) | ORAL | Status: DC | PRN

## 2016-01-11 MED ORDER — DOCUSATE SODIUM 283 MG RE ENEM
1.0000 | ENEMA | RECTAL | Status: DC | PRN
  Filled 2016-01-11: qty 1

## 2016-01-11 MED ORDER — SODIUM CHLORIDE 0.9% FLUSH: 3.0000 mL | Freq: Two times a day (BID) | INTRAVENOUS | Status: DC

## 2016-01-11 MED ORDER — CAMPHOR-MENTHOL 0.5-0.5 % EX LOTN
1.0000 "application " | TOPICAL_LOTION | Freq: Three times a day (TID) | CUTANEOUS | Status: DC | PRN
  Filled 2016-01-11: qty 222

## 2016-01-11 MED ORDER — SORBITOL 70 % SOLN: 30.0000 mL | Status: DC | PRN

## 2016-01-11 MED ORDER — ARGATROBAN 50 MG/50ML IV SOLN
1.0000 ug/kg/min | INTRAVENOUS | Status: DC
  Administered 2016-01-11 – 2016-01-14 (×8): 1 ug/kg/min via INTRAVENOUS
  Filled 2016-01-11 (×8): qty 50

## 2016-01-11 MED ORDER — SODIUM CHLORIDE 0.9 % IV BOLUS (SEPSIS)
1000.0000 mL | Freq: Once | INTRAVENOUS | Status: AC
  Administered 2016-01-11: 1000 mL via INTRAVENOUS

## 2016-01-11 NOTE — ED Notes (Signed)
Report Given to the floor 6E RN.  Patient will be transported to the floor at this time.  No distress noted at this time.

## 2016-01-11 NOTE — Progress Notes (Signed)
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 Ace Inhibitors Cough  . Ampicillin Other (See Comments)    Sensitive per lab paperwork from facility  . Cefazolin Other (See Comments)    Sensitive per lab paperwork from facility  . Cefepime Other (See Comments)    Sensitive per lab paperwork from facility  . Ceftazidime Other (See Comments)    Sensitive per lab paperwork from facility  . Ceftriaxone Other (See Comments)    Sensitive per lab paperwork from facility  . Ciprofloxacin Other (See Comments)    Sensitive per lab paperwork from facility  . Contrast Media [Iodinated Diagnostic Agents] Other (See Comments)    Reaction unknown  . Ertapenem Other (See Comments)    Sensitive per lab paperwork from facility  . Gentamicin Other (See Comments)    Sensitive per lab paperwork from facility  . Imipenem Other (See Comments)    Sensitive per lab paperwork from facility  . Latex Other (See Comments)    Unknown reaction per MAR   . Levofloxacin Other (See Comments)    Sensitive per lab paperwork from facility  . Nitrofuran Derivatives Other (See Comments)    Sensitive per lab paperwork from facility  . Shellfish Allergy Other (See Comments)    Pt cannot recall at this time  . Tobramycin Other (See Comments)    Sensitive per lab paperwork from facility  . Zosyn [Piperacillin Sod-Tazobactam So] Other (See Comments)    Sensitive per lab paperwork from facility    Patient Measurements: Height: 5\' 3"  (160 cm) Weight: 215 lb (97.5 kg) IBW/kg (Calculated) : 52.4  Vital Signs: Temp: 98.6 F (37 C) (12/10 2044) Temp Source: Oral (12/10 2044) BP: 117/56 (12/10 2044) Pulse Rate: 74 (12/10 2044)  Labs:  Recent Labs  13-Sep-2015 1615  HGB 11.4*  HCT 35.1*  PLT 304  CREATININE 6.06*     Estimated Creatinine Clearance: 9.6 mL/min (by C-G formula based on SCr of 6.06 mg/dL (H)).   Medical History: Past Medical History:  Diagnosis Date  . ALLERGIC RHINITIS   . Arthritis   . ASTHMA   . Asthma   . CKD (chronic kidney disease), stage IV (HCC)   . Diabetes mellitus without complication (HCC)   . Esophageal reflux   . Hypertension   . NSTEMI (non-ST elevated myocardial infarction) (HCC)   . SINUSITIS, ACUTE    Assessment: 70yom with recent admission for NSTEMI, TIA, ?PAF, and HIT - discharged on eliquis with planned duration for 3 months. She is now in acute renal failure and uremic so holding eliquis and starting argatroban. Last eliquis dose 12/10 AM. She was previously therapeutic on argatroban 102mcg/kg/min but since she is now in renal failure will start at a lower rate. Baseline CBC wnl.  Goal of Therapy:  aPTT 50-90  seconds Monitor platelets by anticoagulation protocol: Yes   Plan:  1) At 2200, start argatroban 821mcg/kg/min 2) Check aPTT in 2 hours, then every 4 hours until therapeutic x 2 then daily  Fredrik RiggerMarkle, Torrell Krutz Sue 07-24-2015,8:55 PM

## 2016-01-11 NOTE — ED Triage Notes (Signed)
EMS asdvised patient is in a rehab facility, [facility] and she is status post back surgery.  While in rehab, she had a NSTEMI, stroke, E. coli and ARF.  EMS said she was emergently dialyzed and got better, they discontinued the port.  Last week she began to get weak and has gotten progressively worse, edematous, jaundiced and will only answer to verbal stimulation.  Staff says she is nomally up, alert and approrpriatley answers questions.

## 2016-01-11 NOTE — ED Notes (Signed)
Admitting at bedside 

## 2016-01-11 NOTE — H&P (Addendum)
History and Physical    [name] ZOX:[id number] DOB: 1945-02-16 DOA: 01/27/2016  PCP: Heidi Divine, MD Consultants:  Nephrology - Heidi Campbell; Cardiology - Heidi Campbell; Endocrine - Heidi Campbell Patient coming from: [facility]: Daughter - [phone]  Chief Complaint: altered mental status  HPI: Heidi Campbell is a 70 y.o. female with medical history significant of significant medical complications arising after an uncomplicated back surgery on 10/6.  She was most recently admitted from 11/15-26, discharged to [facility] (see below).  She has done fairly well until this week.  Over the last week, she has been much more tired, unable to stay awake, complaining of arms hurting and unable to hold them up, speech slurred and incoherent.  Stopped eating.  Gradual progressive issue.  By 07/04/22, her daughter was feeding her (she was previously able to feed herself).  Family sees her daily.  On Wednesday they noticed subtle changes but more pronounced 04-Jul-2022.  Today, she was unable to stay awake long enough to even chew food that was placed in her mouth.  She is essentially unresponsive.  Responds and then goes immediately back to sleep.    Initially hospitalized 10/6-9/17 with degenerative spondylolisthesis.  Uncomplicated L3 for decompression infusion, did well, discharged home.  Returned on 10/21 with back pain.  Found to have a small seroma/hematoma.  Discharged to home.  Returned again on 10/23 with back pain.  The ER helped to facilitate placement into rehab at that time.  She was brought in again on 11/3 for AMS.  She was admitted from 11/3-10 for acute renal failure superimposed on CKD stage 3 thought to be the result of volume depletion from GI losses with hypotension.  Creatinine went from 1.39 to 8.7 with hyperkalemia to 8.5.  She was taken for emergent hemodialysis and her creatinine improved after 1 session.  She was diagnosed with a UTI from Enterococcus faecalis and was treated  with vancomycin.  She required no further HD and was discharged back to [facility] on 11/10 (with a creatinine of 2.88).  She was sent back to the ER for removal of her HD catheter on 11/14 following repeat labs which indicated no further need for dialysis.    She then returned again with chest pain on 11/15 and was admitted through 11/26.  That was her most extensive hospitalization, with an NSTEMI as well as a TIA, complicated by development of heparin-induced thrombocytopenia.   She was possibly in afib although this was never captured; the recommendation was for anticoagulation for 3 months.  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  She did have renal insufficiency at the time of admission with creatinine 2.63, but the creatinine was down to 1.51 on 11/26.   ED Course: Per Dr. Nancy Campbell: Heidi Campbell is a 70 year old female presenting to the ED with lethargy, slurred speech, and increased edema. She was previously in ARF requiring HD x 1 in 12/2015. There was some concern at SNF that she may be back in renal failure. Uremia could certainly be a cause of her lethargy. She could also have new stroke with questionable slurred speech and possible right sided facial droop. Think infection is less likely, as patient is not meeting any SIRS criteria. Will order CBC, CMET, ABG, ammonia, ethanol, UDS, UA, CXR, and CT head for initial AMS work-up.  4:35PM: Initial ABG with pH 7.365, pCO2 28.7, pO2 91. CXR neg.  5:20PM: CT head negative.  5:50PM: CMP back  with Cr 6.06, BUN 149, K 5.5. Will give a 1L bolus.  5:55PM: Bladder scan with >82000ml. Will place a foley.  6:20PM: Spoke with [facility] who will admit. They are requesting that we call Nephrology. I spoke with Dr. Briant Campbell, who states they will see her tomorrow.   Review of Systems: Unable to obtain - patient is too somnolent to effectively answer questions  Ambulatory Status:  Able to ambulate  with a walker as of 1 week ago  Past Medical History:  Diagnosis Date  . ALLERGIC RHINITIS   . Arthritis   . ASTHMA   . Asthma   . CKD (chronic kidney disease), stage IV (HCC)   . Diabetes mellitus without complication (HCC)   . Esophageal reflux   . Hypertension   . NSTEMI (non-ST elevated myocardial infarction) (HCC)   . SINUSITIS, ACUTE     Past Surgical History:  Procedure Laterality Date  . ABDOMINAL HYSTERECTOMY    . arthroscopic knee Right 2015  . BACK SURGERY    . BREAST REDUCTION SURGERY Bilateral 2006  . BREAST SURGERY    . HAND SURGERY Left    damaged nerve in hand  . IR GENERIC HISTORICAL  12/19/2015   IR FLUORO GUIDE CV LINE RIGHT 12/19/2015 Heidi Balmaniel Hassell, MD [facility]-INTER[facility]  . IR GENERIC HISTORICAL  12/19/2015   IR US GUIDE VASC ACCESS RIGHT [facility]    Social History   Social History  . Marital status: Widowed    Spouse name: N/A  . Number of children: N/A  . Years of education: N/A   Occupational History  . retired    Social History Main Topics  . Smoking status: Never Smoker  . Smokeless tobacco: Never Used  . Alcohol use No  . Drug use: No  . Sexual activity: Not on file   Other Topics Concern  . Not on file   Social History Narrative  . No narrative on file    Allergies  Allergen Reactions  . 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 Ace Inhibitors Cough  . Ampicillin Other (See Comments)    Sensitive per lab paperwork from facility  . Cefazolin Other (See Comments)    Sensitive per lab paperwork from facility  . Cefepime Other (See Comments)    Sensitive per lab paperwork from facility  . Ceftazidime Other (See Comments)    Sensitive per lab paperwork from facility  . Ceftriaxone Other (See Comments)    Sensitive per lab paperwork from facility  . Ciprofloxacin Other (See Comments)    Sensitive per lab paperwork from facility  . Contrast Media [Iodinated Diagnostic Agents] Other (See  Comments)    Reaction unknown  . Ertapenem Other (See Comments)    Sensitive per lab paperwork from facility  . Gentamicin Other (See Comments)    Sensitive per lab paperwork from facility  . Imipenem Other (See Comments)    Sensitive per lab paperwork from facility  . Latex Other (See Comments)    Unknown reaction per MAR   . Levofloxacin Other (See Comments)    Sensitive per lab paperwork from facility  . Nitrofuran Derivatives Other (See Comments)    Sensitive per lab paperwork from facility  . Shellfish Allergy Other (See Comments)    Pt cannot recall at this time  . Tobramycin Other (See Comments)    Sensitive per lab paperwork from facility  . Zosyn [Piperacillin Sod-Tazobactam So] Other (See Comments)  Sensitive per lab paperwork from facility    Family History  Problem Relation Age of Onset  . Diabetes Mother   . Heart attack Father   . Heart disease Father     Prior to Admission medications   Medication Sig Start Date End Date Taking? Authorizing Provider  acetaminophen (TYLENOL) 325 MG tablet Take 650 mg by mouth 3 (three) times daily. And every 4 hours as needed for pain/fever   Yes Historical Provider, MD  Amino Acids-Protein Hydrolys (FEEDING SUPPLEMENT, PRO-STAT SUGAR FREE 64,) LIQD Take 30 mLs by mouth 2 (two) times daily.   Yes Historical Provider, MD  amLODipine (NORVASC) 10 MG tablet Take 1 tablet (10 mg total) by mouth daily. 07/26/14  Yes Donnetta Hutching, MD  apixaban (ELIQUIS) 5 MG TABS tablet Take 1 tablet (5 mg total) by mouth 2 (two) times daily. 12/28/15  Yes Mauricio Annett Gula, MD  carvedilol (COREG) 3.125 MG tablet Take 1 tablet (3.125 mg total) by mouth 2 (two) times daily with a meal. 12/28/15  Yes Mauricio Annett Gula, MD  doxazosin (CARDURA) 4 MG tablet Take 1 tablet (4 mg total) by mouth at bedtime. 12/28/15  Yes Mauricio Annett Gula, MD  ezetimibe (ZETIA) 10 MG tablet Take 1 tablet (10 mg total) by mouth daily. 07/20/13  Yes Reather Littler, MD    ferrous sulfate 325 (65 FE) MG tablet Take 1 tablet (325 mg total) by mouth 3 (three) times daily with meals. 12/28/15  Yes Mauricio Annett Gula, MD  Fluticasone-Salmeterol (ADVAIR) 100-50 MCG/DOSE AEPB Inhale 1 puff into the lungs 2 (two) times daily.    Yes Historical Provider, MD  gabapentin (NEURONTIN) 400 MG capsule Take 400 mg by mouth 3 (three) times daily.   Yes Historical Provider, MD  hydrALAZINE (APRESOLINE) 50 MG tablet Take 1 tablet by mouth 3 times a day. Patient taking differently: Take 50 mg by mouth 3 (three) times daily.  10/17/14  Yes Corky Crafts, MD  Insulin Glargine (LANTUS) 100 UNIT/ML Solostar Pen Inject 10 Units into the skin daily at 10 pm. Patient taking differently: Inject 14 Units into the skin daily at 10 pm.  12/12/15  Yes Alison Murray, MD  omeprazole (PRILOSEC) 20 MG capsule Take 20 mg by mouth daily at 6 (six) AM.    Yes Historical Provider, MD  rosuvastatin (CRESTOR) 40 MG tablet Take 1 tablet (40 mg total) by mouth daily. Patient taking differently: Take 40 mg by mouth at bedtime.  12/22/12  Yes Corky Crafts, MD  albuterol (PROVENTIL HFA;VENTOLIN HFA) 108 (90 BASE) MCG/ACT inhaler Inhale 2 puffs into the lungs every 6 (six) hours as needed for wheezing or shortness of breath.    Historical Provider, MD  bisacodyl (DULCOLAX) 10 MG suppository Place 10 mg rectally every 4 (four) hours as needed for moderate constipation.     Historical Provider, MD  fluticasone (FLONASE) 50 MCG/ACT nasal spray Place 1 spray into both nostrils daily as needed for allergies.     Historical Provider, MD  nitroGLYCERIN (NITROSTAT) 0.4 MG SL tablet Place 0.4 mg under the tongue every 5 (five) minutes as needed for chest pain.    Historical Provider, MD  ondansetron (ZOFRAN ODT) 4 MG disintegrating tablet Take 1 tablet (4 mg total) by mouth every 8 (eight) hours as needed for nausea or vomiting. 01/27/15   Shawn C Joy, PA-C  polyethylene glycol (MIRALAX / GLYCOLAX) packet  Take 17 g by mouth daily as needed (For constipation.).     Historical  Provider, MD    Physical Exam: Vitals:   01/12/2016 1800 01/08/2016 1900 01/22/2016 1915 01/27/2016 2044  BP: (!) 113/49 (!) 120/46 122/63 (!) 117/56  Pulse: 68 67 62 74  Resp: 17 12 14 15   Temp:    98.6 F (37 C)  TempSrc:    Oral  SpO2: 98% 98% 99% 98%  Weight:    97.5 kg (215 lb)  Height:    5\' 3"  (1.6 m)     General: Somnolent, basically obtunded, awakens very briefly to answer a question or two and then immediately returns to slumber Eyes:  PERRL, EOMI, normal lids, iris ENT:  grossly normal hearing, lips & tongue, mmm Neck:  no LAD, masses or thyromegaly Cardiovascular:  RRR, no m/r/g. No LE edema.  Respiratory:  CTA bilaterally, no w/r/r. Normal respiratory effort. Abdomen:  soft, ntnd, NABS Skin:  no rash or induration seen on limited exam Musculoskeletal:  grossly normal tone BUE/BLE, good ROM, no bony abnormality Psychiatric: very somnolent, oriented to person and place (knows she is at the hospital, but not which hospital); not oriented to time Neurologic:  Unable to perform Labs on Admission: I have personally reviewed following labs and imaging studies  CBC:  Recent Labs Lab 01/18/2016 1615  WBC 14.9*  HGB 11.4*  HCT 35.1*  MCV 85.4  PLT 304   Basic Metabolic Panel:  Recent Labs Lab 01/08/2016 1615  NA 132*  K 5.5*  CL 101  CO2 17*  GLUCOSE 167*  BUN 149*  CREATININE 6.06*  CALCIUM 8.4*   GFR: Estimated Creatinine Clearance: 9.6 mL/min (by C-G formula based on SCr of 6.06 mg/dL (H)). Liver Function Tests:  Recent Labs Lab 01/28/2016 1615  AST 917*  ALT 453*  ALKPHOS 117  BILITOT 0.5  PROT 6.2*  ALBUMIN 2.3*   No results for input(s): LIPASE, AMYLASE in the last 168 hours.  Recent Labs Lab 01/10/2016 1615  AMMONIA 54*   Coagulation Profile: No results for input(s): INR, PROTIME in the last 168 hours. Cardiac Enzymes: No results for input(s): CKTOTAL, CKMB, CKMBINDEX,  TROPONINI in the last 168 hours. BNP (last 3 results) No results for input(s): PROBNP in the last 8760 hours. HbA1C: No results for input(s): HGBA1C in the last 72 hours. CBG:  Recent Labs Lab 01/24/2016 2053  GLUCAP 150*   Lipid Profile: No results for input(s): CHOL, HDL, LDLCALC, TRIG, CHOLHDL, LDLDIRECT in the last 72 hours. Thyroid Function Tests: No results for input(s): TSH, T4TOTAL, FREET4, T3FREE, THYROIDAB in the last 72 hours. Anemia Panel: No results for input(s): VITAMINB12, FOLATE, FERRITIN, TIBC, IRON, RETICCTPCT in the last 72 hours. Urine analysis:    Component Value Date/Time   COLORURINE YELLOW 12/25/2015 1402   APPEARANCEUR CLOUDY (A) 12/25/2015 1402   LABSPEC 1.017 12/25/2015 1402   PHURINE 6.0 12/25/2015 1402   GLUCOSEU >1000 (A) 12/25/2015 1402   GLUCOSEU NEGATIVE 07/03/2013 0856   HGBUR LARGE (A) 12/25/2015 1402   BILIRUBINUR NEGATIVE 12/25/2015 1402   BILIRUBINUR Neg 08/06/2014 1058   KETONESUR NEGATIVE 12/25/2015 1402   PROTEINUR 100 (A) 12/25/2015 1402   UROBILINOGEN 0.2 08/06/2014 1058   UROBILINOGEN 1.0 07/26/2014 1804   NITRITE NEGATIVE 12/25/2015 1402   LEUKOCYTESUR LARGE (A) 12/25/2015 1402    Creatinine Clearance: Estimated Creatinine Clearance: 9.6 mL/min (by C-G formula based on SCr of 6.06 mg/dL (H)).  Sepsis Labs: @LABRCNTIP (procalcitonin:4,lacticidven:4) )No results found for this or any previous visit (from the past 240 hour(s)).   Radiological Exams on Admission: Ct Head  Wo Contrast  Result Date: 04/04/2015 CLINICAL DATA:  Lethargy. EXAM: CT HEAD WITHOUT CONTRAST TECHNIQUE: Contiguous axial images were obtained from the base of the skull through the vertex without intravenous contrast. COMPARISON:  12/24/2015 head CT. FINDINGS: Brain: No evidence of parenchymal hemorrhage or extra-axial fluid collection. No mass lesion, mass effect, or midline shift. No CT evidence of acute infarction. Intracranial atherosclerosis. Cerebral volume  is age appropriate. No ventriculomegaly. Vascular: No hyperdense vessel or unexpected calcification. Skull: No evidence of calvarial fracture. Sinuses/Orbits: The visualized paranasal sinuses are essentially clear. Other:  The mastoid air cells are unopacified. IMPRESSION: Negative head CT.  No evidence of acute intracranial abnormality. Electronically Signed   By: Delbert PhenixJason A Poff M.D.   On: 04/04/2015 17:20   Dg Chest Portable 1 View  Result Date: 04/04/2015 CLINICAL DATA:  Weakness. EXAM: PORTABLE CHEST 1 VIEW COMPARISON:  December 17, 2015 FINDINGS: Stable cardiomegaly. Tortuous thoracic aorta, probably unchanged given difference in technique. No pneumothorax. No pulmonary nodules, masses, or focal infiltrates. IMPRESSION: No active disease. Electronically Signed   By: Gerome Samavid  Williams III M.D   On: 04/04/2015 16:39    EKG: not done in the ER; pending  Assessment/Plan Principal Problem:   Acute renal failure (ARF) (HCC) Active Problems:   Type 2 diabetes mellitus treated with insulin (HCC)   Essential hypertension   HIT (heparin-induced thrombocytopenia) (HCC)   Acute encephalopathy   Current use of long term anticoagulation   Hyperkalemia   Elevated LFTs   Acute renal failure -Creatinine 6.06, up from 1.51 -Appears to be related to urinary retention, but there is no known reason for her to have retention -She did have recent back surgery and this begs the question of whether there is an obstructive process -Will order stat renal ultrasound for further evaluation -Will place foley -Nephrology consultation requested; while the foley placement may resolve the obstruction and thus resolve the uremia, given her recent need for emergent hemodialysis and now recurrent renal failure, it seems prudent to have them aware of this admission and to seek expert guidance -Nephrology prn order set utilized -Will send UA and urine culture - although it would be very unlikely that a UTI would cause this  extent of renal failure (but would explain the leukocytosis)  Acute encephalopathy -Likely related to uremia -However, if patient's encephalopathy does not improve rapidly with improvement of uremia then she likely needs a brain MRI given her recent TIA -For now, patient is NPO and her home medications are held; once she is more alert and able to safely tolerate PO, these orders can be modified.  Hyperkalemia -Mild currently, but previously required emergent dialysis because of this issue -EKG was not ordered in ER -I have ordered a STAT EKG to ensure no changes associated with hyperkalemia are present, and also a repeat EKG in the AM. -STAT EKG is negative.  Elevated LFTs -Prior minimal elevation (AST 89, ALT 55) on 11/17, now marked elevation (AST 917, ALT 453). -Ammonia is also mildly elevated at 54 -This may be associated with hepatic congestion resulting from generalized fluid overload in the setting of acute renal failure -Will recheck in AM and trend -If not improving, may need GI input  HIT -During prior hospitalization, patient was started on Heparin drip with plan for long-term anticoagulation (for at least 3 months) resulting from possible (uncaptured) afib resulting in NSTEMI and TIA. -After initiation of Heparin, she developed what was thought to be HIT. -She was changed to argatroban and then transitioned  to Eliquis for outpatient use. -Due to her current inability to tolerate PO intake including medications, the Eliquis will be held -As such, will resume argatroban (spoke with pharmacy and they agree that there are really no other options at this time).  DM -Suboptimal control based on prior A1c of 8.0 on 12/25/15 -Continue home insulin (10 units rather than 14 given NPO status and lack of clarity about when the dose was increased) and SSI  Anemia -Hgb stable at 11.4 (prior 10.6)   DVT prophylaxis: Argatroban Code Status:  Full - confirmed with  patient/family Family Communication: Son and daughter present throughout evaluation Disposition Plan:  Home once clinically improved Consults called: Nephrology  Admission status: Admit - It is my clinical opinion that admission to INPATIENT is reasonable and necessary because this patient will require at least 2 midnights in the hospital to treat this condition based on the medical complexity of the problems presented.  Given the aforementioned information, the predictability of an adverse outcome is felt to be significant.     Jonah Blue MD [facility]  If 7PM-7AM, please contact night-coverage [email or web address] Password [email or web address]  02/09/16, 10:41 PM

## 2016-01-11 NOTE — ED Provider Notes (Signed)
[facility] DEPT Provider Note   CSN: [id number] Arrival date & time: 01/10/2016  1429  History   Chief Complaint Chief Complaint  Patient presents with  . Edema    EMS asdvised patient is in a rehab facility, [facility] and she is status post back surgery.  While in rehab, she had a NSTEMI, stroke, E. coli and ARF.  EMS said she was emergently dialyzed and got better, they discontinued the port.  Last week she began to get weak and has gotten progressively worse, edematous, jaundiced and will only answer to verbal stimulation.  Staff says she is nomally up, alert and approrpriatley answers questions.      HPI Heidi Campbell is a 70 y.o. female with a PMH of HTN, T2DM, CAD presenting to the ED with worsening edema and lethargy. She had a back surgery at the beginning of October. She has been staying at a SNF since then. Her post-op period has been complicated by NSTEMI, TIA, HIT, and ARF with HD x 1. She recently had her HD catheter removed on 11/14 because her renal function had improved.   Three days ago, patient's daughter noticed that she has been more lethargic than normal. She hasn't been making sense, which isn't normal for her. Her daughter has also noticed new slurred speech. Patient has had swelling of her hands and feet and is not urinating as much as normal. She denies any fevers, chills, chest pain, abdominal pain, nausea, or vomiting. She endorses shortness of breath. Before the last three days, patient was acting like her normal self. At baseline, she is completely alert and oriented and converses normally.  HPI  Past Medical History:  Diagnosis Date  . ALLERGIC RHINITIS   . Arthritis   . ASTHMA   . Asthma   . CKD (chronic kidney disease), stage IV (HCC)   . Diabetes mellitus without complication (HCC)   . Esophageal reflux   . Hypertension   . NSTEMI (non-ST elevated myocardial infarction) (HCC)   . SINUSITIS, ACUTE     Patient Active Problem List   Diagnosis Date Noted  . Cerebrovascular accident (CVA) (HCC)   . Transient cerebral ischemia   . Hyperglycemia   . Stenosis of left carotid artery   . Cardiac arrhythmia   . HIT (heparin-induced thrombocytopenia) (HCC)   . Elevated troponin   . Lethargy   . NSTEMI (non-ST elevated myocardial infarction) (HCC) 12/17/2015  . Atypical chest pain 12/17/2015  . Normocytic anemia 12/17/2015  . Thrombocytopenia (HCC) 12/17/2015  . Somnolence   . Acute renal failure (HCC) 12/05/2015  . Pressure injury of skin 12/05/2015  . Degenerative spondylolisthesis 11/07/2015  . Carotid artery disease (HCC) 06/20/2014  . Bradycardia 10/12/2013  . Fatigue 10/12/2013  . Other hyperlipidemia 05/14/2013  . Type II or unspecified type diabetes mellitus without mention of complication, uncontrolled 02/19/2013  . Coronary atherosclerosis of native coronary artery 12/22/2012  . Obesity 12/22/2012  . Essential hypertension   . SINUSITIS, ACUTE 04/01/2008  . Type 2 diabetes mellitus treated with insulin (HCC) 02/23/2007  . ALLERGIC RHINITIS 02/23/2007  . ASTHMA 02/23/2007  . Esophageal reflux 02/23/2007    Past Surgical History:  Procedure Laterality Date  . ABDOMINAL HYSTERECTOMY    . arthroscopic knee Right 2015  . BACK SURGERY    . BREAST REDUCTION SURGERY Bilateral 2006  . BREAST SURGERY    . HAND SURGERY Left    damaged nerve in hand  . IR GENERIC HISTORICAL  12/19/2015  IR FLUORO GUIDE CV LINE RIGHT 12/19/2015 Oley Balm, MD [facility]  . IR GENERIC HISTORICAL  12/19/2015   IR US GUIDE VASC ACCESS RIGHT [facility]    OB History    No data available       Home Medications    Prior to Admission medications   Medication Sig Start Date End Date Taking? Authorizing Provider  acetaminophen (TYLENOL) 325 MG tablet Take 650 mg by mouth 3 (three) times daily. And every 4 hours as needed for pain/fever   Yes Historical Provider, MD  Amino Acids-Protein Hydrolys (FEEDING SUPPLEMENT,  PRO-STAT SUGAR FREE 64,) LIQD Take 30 mLs by mouth 2 (two) times daily.   Yes Historical Provider, MD  amLODipine (NORVASC) 10 MG tablet Take 1 tablet (10 mg total) by mouth daily. 07/26/14  Yes Donnetta Hutching, MD  apixaban (ELIQUIS) 5 MG TABS tablet Take 1 tablet (5 mg total) by mouth 2 (two) times daily. 12/28/15  Yes Mauricio Annett Gula, MD  carvedilol (COREG) 3.125 MG tablet Take 1 tablet (3.125 mg total) by mouth 2 (two) times daily with a meal. 12/28/15  Yes Mauricio Annett Gula, MD  doxazosin (CARDURA) 4 MG tablet Take 1 tablet (4 mg total) by mouth at bedtime. 12/28/15  Yes Mauricio Annett Gula, MD  ezetimibe (ZETIA) 10 MG tablet Take 1 tablet (10 mg total) by mouth daily. 07/20/13  Yes Reather Littler, MD  ferrous sulfate 325 (65 FE) MG tablet Take 1 tablet (325 mg total) by mouth 3 (three) times daily with meals. 12/28/15  Yes Mauricio Annett Gula, MD  Fluticasone-Salmeterol (ADVAIR) 100-50 MCG/DOSE AEPB Inhale 1 puff into the lungs 2 (two) times daily.    Yes Historical Provider, MD  gabapentin (NEURONTIN) 400 MG capsule Take 400 mg by mouth 3 (three) times daily.   Yes Historical Provider, MD  hydrALAZINE (APRESOLINE) 50 MG tablet Take 1 tablet by mouth 3 times a day. Patient taking differently: Take 50 mg by mouth 3 (three) times daily.  10/17/14  Yes Corky Crafts, MD  Insulin Glargine (LANTUS) 100 UNIT/ML Solostar Pen Inject 10 Units into the skin daily at 10 pm. Patient taking differently: Inject 14 Units into the skin daily at 10 pm.  12/12/15  Yes Alison Murray, MD  omeprazole (PRILOSEC) 20 MG capsule Take 20 mg by mouth daily at 6 (six) AM.    Yes Historical Provider, MD  rosuvastatin (CRESTOR) 40 MG tablet Take 1 tablet (40 mg total) by mouth daily. Patient taking differently: Take 40 mg by mouth at bedtime.  12/22/12  Yes Corky Crafts, MD  albuterol (PROVENTIL HFA;VENTOLIN HFA) 108 (90 BASE) MCG/ACT inhaler Inhale 2 puffs into the lungs every 6 (six) hours as needed for  wheezing or shortness of breath.    Historical Provider, MD  bisacodyl (DULCOLAX) 10 MG suppository Place 10 mg rectally every 4 (four) hours as needed for moderate constipation.     Historical Provider, MD  fluticasone (FLONASE) 50 MCG/ACT nasal spray Place 1 spray into both nostrils daily as needed for allergies.     Historical Provider, MD  nitroGLYCERIN (NITROSTAT) 0.4 MG SL tablet Place 0.4 mg under the tongue every 5 (five) minutes as needed for chest pain.    Historical Provider, MD  ondansetron (ZOFRAN ODT) 4 MG disintegrating tablet Take 1 tablet (4 mg total) by mouth every 8 (eight) hours as needed for nausea or vomiting. 01/27/15   Shawn C Joy, PA-C  polyethylene glycol (MIRALAX / GLYCOLAX) packet Take 17  g by mouth daily as needed (For constipation.).     Historical Provider, MD    Family History Family History  Problem Relation Age of Onset  . Diabetes Mother   . Heart attack Father   . Heart disease Father     Social History Social History  Substance Use Topics  . Smoking status: Never Smoker  . Smokeless tobacco: Never Used  . Alcohol use No     Allergies   Heparin; Ace inhibitors; Ampicillin; Cefazolin; Cefepime; Ceftazidime; Ceftriaxone; Ciprofloxacin; Contrast media [iodinated diagnostic agents]; Ertapenem; Gentamicin; Imipenem; Latex; Levofloxacin; Nitrofuran derivatives; Shellfish allergy; Tobramycin; and Zosyn [piperacillin sod-tazobactam so]   Review of Systems Review of Systems 10 Systems reviewed and are negative for acute change except as noted in the HPI.  Physical Exam Updated Vital Signs BP 116/56   Pulse 66   Temp 99.2 F (37.3 C) (Rectal)   Resp 12   SpO2 98%   Physical Exam  Constitutional: She is oriented to person, place, and time.  Lethargic, mildly diaphoretic, awakens to voice, answers some questions and then falls back asleep  HENT:  Head: Normocephalic and atraumatic.  Mouth/Throat: Oropharynx is clear and moist.  Dry mucous  membranes  Eyes: Conjunctivae and EOM are normal. Pupils are equal, round, and reactive to light. No scleral icterus.  Neck: Normal range of motion. Neck supple. No JVD present.  Cardiovascular: Normal rate and regular rhythm.   II/VI systolic murmur heard loudest in the RUSB  Pulmonary/Chest: Effort normal and breath sounds normal. No respiratory distress. She has no wheezes.  Abdominal: Soft. Bowel sounds are normal. She exhibits no distension. There is no tenderness. There is no rebound.  Musculoskeletal: Normal range of motion. She exhibits no tenderness.  2+ pitting edema to the mid shin bilaterally  Lymphadenopathy:    She has no cervical adenopathy.  Neurological: She is alert and oriented to person, place, and time.  Difficult neuro exam, as Pt does not follow all commands and intermittently falls asleep during exam. ?right sided facial droop. Moves all extremities spontaneously. 5/5 grip strength bilaterally.     ED Treatments / Results  Labs (all labs ordered are listed, but only abnormal results are displayed) Labs Reviewed  CBC - Abnormal; Notable for the following:       Result Value   WBC 14.9 (*)    Hemoglobin 11.4 (*)    HCT 35.1 (*)    All other components within normal limits  COMPREHENSIVE METABOLIC PANEL - Abnormal; Notable for the following:    Sodium 132 (*)    Potassium 5.5 (*)    CO2 17 (*)    Glucose, Bld 167 (*)    BUN 149 (*)    Creatinine, Ser 6.06 (*)    Calcium 8.4 (*)    Total Protein 6.2 (*)    Albumin 2.3 (*)    AST 917 (*)    ALT 453 (*)    GFR calc non Af Amer 6 (*)    GFR calc Af Amer 7 (*)    All other components within normal limits  AMMONIA - Abnormal; Notable for the following:    Ammonia 54 (*)    All other components within normal limits  I-STAT ARTERIAL BLOOD GAS, ED - Abnormal; Notable for the following:    pCO2 arterial 28.7 (*)    Bicarbonate 16.4 (*)    Acid-base deficit 8.0 (*)    All other components within normal limits   ETHANOL  URINALYSIS, ROUTINE  W REFLEX MICROSCOPIC  RAPID URINE DRUG SCREEN, HOSP PERFORMED  BLOOD GAS, ARTERIAL    EKG  EKG Interpretation None       Radiology Ct Head Wo Contrast  Result Date: 01/08/2016 CLINICAL DATA:  Lethargy. EXAM: CT HEAD WITHOUT CONTRAST TECHNIQUE: Contiguous axial images were obtained from the base of the skull through the vertex without intravenous contrast. COMPARISON:  12/24/2015 head CT. FINDINGS: Brain: No evidence of parenchymal hemorrhage or extra-axial fluid collection. No mass lesion, mass effect, or midline shift. No CT evidence of acute infarction. Intracranial atherosclerosis. Cerebral volume is age appropriate. No ventriculomegaly. Vascular: No hyperdense vessel or unexpected calcification. Skull: No evidence of calvarial fracture. Sinuses/Orbits: The visualized paranasal sinuses are essentially clear. Other:  The mastoid air cells are unopacified. IMPRESSION: Negative head CT.  No evidence of acute intracranial abnormality. Electronically Signed   By: Delbert Phenix M.D.   On: 01/18/2016 17:20   Dg Chest Portable 1 View  Result Date: 01/22/2016 CLINICAL DATA:  Weakness. EXAM: PORTABLE CHEST 1 VIEW COMPARISON:  December 17, 2015 FINDINGS: Stable cardiomegaly. Tortuous thoracic aorta, probably unchanged given difference in technique. No pneumothorax. No pulmonary nodules, masses, or focal infiltrates. IMPRESSION: No active disease. Electronically Signed   By: Gerome Sam III M.D   On: 01/30/2016 16:39    Procedures Procedures (including critical care time)  Medications Ordered in ED Medications  sodium chloride 0.9 % bolus 1,000 mL (not administered)     Initial Impression / Assessment and Plan / ED Course  I have reviewed the triage vital signs and the nursing notes.  Pertinent labs & imaging results that were available during my care of the patient were reviewed by me and considered in my medical decision making (see chart for  details).  Clinical Course    Heidi Campbell is a 70 year old female presenting to the ED with lethargy, slurred speech, and increased edema. She was previously in ARF requiring HD x 1 in 12/2015. There was some concern at SNF that she may be back in renal failure. Uremia could certainly be a cause of her lethargy. She could also have new stroke with questionable slurred speech and possible right sided facial droop. Think infection is less likely, as patient is not meeting any SIRS criteria. Will order CBC, CMET, ABG, ammonia, ethanol, UDS, UA, CXR, and CT head for initial AMS work-up.  4:35PM: Initial ABG with pH 7.365, pCO2 28.7, pO2 91. CXR neg.   5:20PM: CT head negative.  5:50PM: CMP back with Cr 6.06, BUN 149, K 5.5. Will give a 1L bolus.  5:55PM: Bladder scan with >813ml. Will place a foley.  6:20PM: Spoke with [facility] who will admit. They are requesting that we call Nephrology. I spoke with Dr. Briant Cedar, who states they will see her tomorrow.   Final Clinical Impressions(s) / ED Diagnoses   Final diagnoses:  Acute renal failure, unspecified acute renal failure type (HCC)  Acute urinary retention  Altered mental status, unspecified altered mental status type    New Prescriptions New Prescriptions   No medications on file     Campbell Stall, MD 01/13/2016 1829    Campbell Stall, MD 01/02/2016 1830    Mancel Bale, MD 01/14/16 2040

## 2016-01-12 ENCOUNTER — Inpatient Hospital Stay (HOSPITAL_COMMUNITY): Payer: Medicare Other

## 2016-01-12 DIAGNOSIS — I272 Pulmonary hypertension, unspecified: Secondary | ICD-10-CM

## 2016-01-12 DIAGNOSIS — G009 Bacterial meningitis, unspecified: Secondary | ICD-10-CM

## 2016-01-12 DIAGNOSIS — N170 Acute kidney failure with tubular necrosis: Secondary | ICD-10-CM | POA: Diagnosis present

## 2016-01-12 DIAGNOSIS — I5032 Chronic diastolic (congestive) heart failure: Secondary | ICD-10-CM | POA: Diagnosis present

## 2016-01-12 DIAGNOSIS — N184 Chronic kidney disease, stage 4 (severe): Secondary | ICD-10-CM

## 2016-01-12 DIAGNOSIS — N3000 Acute cystitis without hematuria: Secondary | ICD-10-CM | POA: Diagnosis present

## 2016-01-12 LAB — BASIC METABOLIC PANEL
ANION GAP: 15 (ref 5–15)
Anion gap: 15 (ref 5–15)
BUN: 158 mg/dL — ABNORMAL HIGH (ref 6–20)
BUN: 158 mg/dL — ABNORMAL HIGH (ref 6–20)
CHLORIDE: 103 mmol/L (ref 101–111)
CO2: 16 mmol/L — ABNORMAL LOW (ref 22–32)
CO2: 16 mmol/L — ABNORMAL LOW (ref 22–32)
CREATININE: 6.73 mg/dL — AB (ref 0.44–1.00)
Calcium: 8.3 mg/dL — ABNORMAL LOW (ref 8.9–10.3)
Calcium: 8.3 mg/dL — ABNORMAL LOW (ref 8.9–10.3)
Chloride: 101 mmol/L (ref 101–111)
Creatinine, Ser: 6.66 mg/dL — ABNORMAL HIGH (ref 0.44–1.00)
GFR calc Af Amer: 7 mL/min — ABNORMAL LOW (ref >60)
GFR calc non Af Amer: 6 mL/min — ABNORMAL LOW (ref >60)
GFR, EST AFRICAN AMERICAN: 6 mL/min — AB (ref >60)
GFR, EST NON AFRICAN AMERICAN: 6 mL/min — AB (ref >60)
GLUCOSE: 116 mg/dL — AB (ref 65–99)
Glucose, Bld: 106 mg/dL — ABNORMAL HIGH (ref 65–99)
POTASSIUM: 5.6 mmol/L — AB (ref 3.5–5.1)
Potassium: 5.7 mmol/L — ABNORMAL HIGH (ref 3.5–5.1)
SODIUM: 132 mmol/L — AB (ref 135–145)
SODIUM: 134 mmol/L — AB (ref 135–145)

## 2016-01-12 LAB — GLUCOSE, CAPILLARY
GLUCOSE-CAPILLARY: 91 mg/dL (ref 65–99)
Glucose-Capillary: 122 mg/dL — ABNORMAL HIGH (ref 65–99)
Glucose-Capillary: 111 mg/dL — ABNORMAL HIGH (ref 65–99)
Glucose-Capillary: 88 mg/dL (ref 65–99)

## 2016-01-12 LAB — CBC
HEMATOCRIT: 32.6 % — AB (ref 36.0–46.0)
HEMOGLOBIN: 10.6 g/dL — AB (ref 12.0–15.0)
MCH: 27.7 pg (ref 26.0–34.0)
MCHC: 32.5 g/dL (ref 30.0–36.0)
MCV: 85.1 fL (ref 78.0–100.0)
Platelets: 286 10*3/uL (ref 150–400)
RBC: 3.83 MIL/uL — ABNORMAL LOW (ref 3.87–5.11)
RDW: 15.3 % (ref 11.5–15.5)
WBC: 13.2 10*3/uL — AB (ref 4.0–10.5)

## 2016-01-12 LAB — URINALYSIS, ROUTINE W REFLEX MICROSCOPIC
BACTERIA UA: NONE SEEN
Bilirubin Urine: NEGATIVE
Glucose, UA: NEGATIVE mg/dL
KETONES UR: NEGATIVE mg/dL
Nitrite: NEGATIVE
PROTEIN: 100 mg/dL — AB
Specific Gravity, Urine: 1.012 (ref 1.005–1.030)
Squamous Epithelial / LPF: NONE SEEN
pH: 5 (ref 5.0–8.0)

## 2016-01-12 LAB — HEPATIC FUNCTION PANEL
ALBUMIN: 2 g/dL — AB (ref 3.5–5.0)
ALT: 441 U/L — ABNORMAL HIGH (ref 14–54)
AST: 913 U/L — AB (ref 15–41)
Alkaline Phosphatase: 109 U/L (ref 38–126)
Bilirubin, Direct: 0.2 mg/dL (ref 0.1–0.5)
Indirect Bilirubin: 0.6 mg/dL (ref 0.3–0.9)
TOTAL PROTEIN: 5.7 g/dL — AB (ref 6.5–8.1)
Total Bilirubin: 0.8 mg/dL (ref 0.3–1.2)

## 2016-01-12 LAB — APTT
APTT: 64 s — AB (ref 24–36)
aPTT: 55 seconds — ABNORMAL HIGH (ref 24–36)
aPTT: 78 seconds — ABNORMAL HIGH (ref 24–36)

## 2016-01-12 MED ORDER — SODIUM CHLORIDE 0.9 % IV SOLN
2.0000 g | INTRAVENOUS | Status: DC
  Filled 2016-01-12: qty 2000

## 2016-01-12 MED ORDER — SODIUM CHLORIDE 0.9 % IV SOLN
2.0000 g | INTRAVENOUS | Status: DC
  Administered 2016-01-12 – 2016-01-13 (×2): 2 g via INTRAVENOUS
  Filled 2016-01-12 (×3): qty 2000

## 2016-01-12 MED ORDER — ORAL CARE MOUTH RINSE
15.0000 mL | Freq: Two times a day (BID) | OROMUCOSAL | Status: DC
  Administered 2016-01-13 – 2016-01-14 (×2): 15 mL via OROMUCOSAL

## 2016-01-12 MED ORDER — CEFTRIAXONE SODIUM 2 G IJ SOLR
2.0000 g | Freq: Two times a day (BID) | INTRAMUSCULAR | Status: DC
  Administered 2016-01-12 – 2016-01-14 (×4): 2 g via INTRAVENOUS
  Filled 2016-01-12 (×5): qty 2

## 2016-01-12 MED ORDER — SODIUM BICARBONATE 8.4 % IV SOLN
INTRAVENOUS | Status: AC
  Administered 2016-01-12 – 2016-01-13 (×2): via INTRAVENOUS
  Filled 2016-01-12 (×5): qty 150

## 2016-01-12 MED ORDER — CHLORHEXIDINE GLUCONATE 0.12 % MT SOLN
15.0000 mL | Freq: Two times a day (BID) | OROMUCOSAL | Status: DC
  Administered 2016-01-12 – 2016-01-14 (×4): 15 mL via OROMUCOSAL
  Filled 2016-01-12 (×4): qty 15

## 2016-01-12 MED ORDER — SODIUM CHLORIDE 0.9 % IV SOLN
2000.0000 mg | Freq: Once | INTRAVENOUS | Status: AC
  Administered 2016-01-12: 2000 mg via INTRAVENOUS
  Filled 2016-01-12: qty 2000

## 2016-01-12 NOTE — Progress Notes (Signed)
ANTICOAGULATION CONSULT NOTE - Follow Up Consult  Pharmacy Consult for Argatroban (Apixaban on hold) Indication: Recent NSTEMI/TIA from possible afib, Hx HIT  Allergies  Allergen Reactions  . 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 Ace Inhibitors Cough  . Ampicillin Other (See Comments)    Sensitive per lab paperwork from facility  . Cefazolin Other (See Comments)    Sensitive per lab paperwork from facility  . Cefepime Other (See Comments)    Sensitive per lab paperwork from facility  . Ceftazidime Other (See Comments)    Sensitive per lab paperwork from facility  . Ceftriaxone Other (See Comments)    Sensitive per lab paperwork from facility  . Ciprofloxacin Other (See Comments)    Sensitive per lab paperwork from facility  . Contrast Media [Iodinated Diagnostic Agents] Other (See Comments)    Reaction unknown  . Ertapenem Other (See Comments)    Sensitive per lab paperwork from facility  . Gentamicin Other (See Comments)    Sensitive per lab paperwork from facility  . Imipenem Other (See Comments)    Sensitive per lab paperwork from facility  . Latex Other (See Comments)    Unknown reaction per MAR   . Levofloxacin Other (See Comments)    Sensitive per lab paperwork from facility  . Nitrofuran Derivatives Other (See Comments)    Sensitive per lab paperwork from facility  . Shellfish Allergy Other (See Comments)    Pt cannot recall at this time  . Tobramycin Other (See Comments)    Sensitive per lab paperwork from facility  . Zosyn [Piperacillin Sod-Tazobactam So] Other (See Comments)    Sensitive per lab paperwork from facility    Patient Measurements: Height: 5\' 3"  (160 cm) Weight: 215 lb (97.5 kg) IBW/kg (Calculated) : 52.4  Vital Signs: Temp: 98.6 F (37 C) (12/10 2044) Temp Source: Oral (12/10 2044) BP: 129/51 (12/10 2300) Pulse Rate: 68 (12/10 2300)  Labs:  Recent Labs  02-21-15 1615 01/12/16 0048  HGB 11.4*   --   HCT 35.1*  --   PLT 304  --   APTT  --  55*  CREATININE 6.06*  --     Estimated Creatinine Clearance: 9.6 mL/min (by C-G formula based on SCr of 6.06 mg/dL (H)).   Assessment: 70yom with recent admission for NSTEMI, TIA, ?PAF, and HIT - discharged on eliquis with planned duration for 3 months. She is now in acute renal failure and uremic so holding eliquis and starting argatroban. Last eliquis dose 12/10 AM. She was previously therapeutic on argatroban 232mcg/kg/min but since she is now in renal failure will start at a lower rate. Baseline CBC wnl.  Initial aPTT is within therapeutic range at 55  Goal of Therapy:  aPTT 50-90 seconds Monitor platelets by anticoagulation protocol: Yes   Plan:  -Cont argatroban 1 mcg/kg/min -0500 aPTT  Abran DukeLedford, Kaysa Roulhac 01/12/2016,1:25 AM

## 2016-01-12 NOTE — Significant Event (Signed)
Rapid Response Event Note RN called for a second opinion in regards to pt lethargic Overview: Time Called: 2257 Arrival Time: 2330 Event Type: Other (Comment) (second opinion )  Initial Focused Assessment: Upon arrival pt sleeping, easily awaked with verbal stimuli, answer all questions appropriately, skin warm and dry. This was reason for pt admit, all notes support same findings as my assessment. Dr. Ophelia CharterYates paged and discussed at length this is reason for admit, pt able to answer questions appropriately spite increase lethargy. Per son at bedside this had been his mother's presentation since Tuesday. He denies any worsening changes to patient since admitted from ED.  Family became concerned after several days with no improvement at rehabilitation center. They called 911 to have pt transported to hospital for further evaluation of not only lethargy but to include edema to BLE, BUE, and weakness in all extremities. He reports patient has not been able to use BUE since Tuesday. Foley catheter placed prior to my arrival, urine noted to be dark in color with thick mucous present.  BP 129/51, HR 66, 98% RA, CBG 139 Interventions: Order for urine sample and urine culture to be collected.   Plan of Care (if not transferred): Nephrology was consulted in ED and they are to assess pt in am.  Christel MormonZee made aware of plan, encouraged to call with any new changes in mental status or concerns.  Event Summary: Name of Physician Notified: Dr. Ophelia CharterYates at 0020    at    Outcome: Stayed in room and stabalized     [place], [place] Paige

## 2016-01-12 NOTE — Progress Notes (Signed)
ANTICOAGULATION CONSULT NOTE - Follow Up Consult  Pharmacy Consult for Argatroban (Apixaban on hold) Indication: Recent NSTEMI/TIA from possible afib, Hx HIT  Allergies  Allergen Reactions  . 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 Ace Inhibitors Cough  . Contrast Media [Iodinated Diagnostic Agents] Other (See Comments)    Reaction unknown  . Latex Other (See Comments)    Unknown reaction per MAR   . Shellfish Allergy Other (See Comments)    Pt cannot recall at this time    Patient Measurements: Height: 5\' 3"  (160 cm) Weight: 215 lb (97.5 kg) IBW/kg (Calculated) : 52.4  Vital Signs: Temp: 99 F (37.2 C) (12/11 0907) Temp Source: Axillary (12/11 0907) BP: 114/41 (12/11 1620) Pulse Rate: 65 (12/11 1620)  Labs:  Recent Labs  01/18/2016 1615 01/12/16 0048 01/12/16 0817 01/12/16 1637  HGB 11.4*  --  10.6*  --   HCT 35.1*  --  32.6*  --   PLT 304  --  286  --   APTT  --  55* 78* 64*  CREATININE 6.06*  --  6.66* 6.73*    Estimated Creatinine Clearance: 8.6 mL/min (by C-G formula based on SCr of 6.73 mg/dL (H)).   Assessment: 70yom with recent admission for NSTEMI, TIA, possible PAF> currently in SR, and Hx HIT - discharged on eliquis with planned duration for 3 months. She is now in acute renal failure and uremic so holding eliquis and covering with argatroban. Last eliquis dose 12/10 AM. Currently  therapeutic on argatroban 801mcg/kg/min aPTT 64sec.  -CBC WNL -Cr remains elevated 6.73 (Baseline ~1)    Goal of Therapy:  aPTT 50-90 seconds Monitor platelets by anticoagulation protocol: Yes   Plan:  -Cont argatroban 1 mcg/kg/min -12 hour aPTTs at 0500 and 1700 due to renal function   Gwyndolyn KaufmanKai Kang Bernette Redbird(Kenny), PharmD  PGY1 Pharmacy Resident Pager: [phone] 01/12/2016 5:57 PM

## 2016-01-12 NOTE — Progress Notes (Signed)
ANTICOAGULATION CONSULT NOTE - Follow Up Consult  Pharmacy Consult for Argatroban (Apixaban on hold) Indication: Recent NSTEMI/TIA from possible afib, Hx HIT  Allergies  Allergen Reactions  . 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 Ace Inhibitors Cough  . Ampicillin Other (See Comments)    Sensitive per lab paperwork from facility  . Cefazolin Other (See Comments)    Sensitive per lab paperwork from facility  . Cefepime Other (See Comments)    Sensitive per lab paperwork from facility  . Ceftazidime Other (See Comments)    Sensitive per lab paperwork from facility  . Ceftriaxone Other (See Comments)    Sensitive per lab paperwork from facility  . Ciprofloxacin Other (See Comments)    Sensitive per lab paperwork from facility  . Contrast Media [Iodinated Diagnostic Agents] Other (See Comments)    Reaction unknown  . Ertapenem Other (See Comments)    Sensitive per lab paperwork from facility  . Gentamicin Other (See Comments)    Sensitive per lab paperwork from facility  . Imipenem Other (See Comments)    Sensitive per lab paperwork from facility  . Latex Other (See Comments)    Unknown reaction per MAR   . Levofloxacin Other (See Comments)    Sensitive per lab paperwork from facility  . Nitrofuran Derivatives Other (See Comments)    Sensitive per lab paperwork from facility  . Shellfish Allergy Other (See Comments)    Pt cannot recall at this time  . Tobramycin Other (See Comments)    Sensitive per lab paperwork from facility  . Zosyn [Piperacillin Sod-Tazobactam So] Other (See Comments)    Sensitive per lab paperwork from facility    Patient Measurements: Height: 5\' 3"  (160 cm) Weight: 215 lb (97.5 kg) IBW/kg (Calculated) : 52.4  Vital Signs: Temp: 99 F (37.2 C) (12/11 0907) Temp Source: Axillary (12/11 0907) BP: 121/46 (12/11 0907) Pulse Rate: 63 (12/11 0907)  Labs:  Recent Labs  07/10/15 1615 01/12/16 0048  01/12/16 0817  HGB 11.4*  --  10.6*  HCT 35.1*  --  32.6*  PLT 304  --  286  APTT  --  55* 78*  CREATININE 6.06*  --  6.66*    Estimated Creatinine Clearance: 8.7 mL/min (by C-G formula based on SCr of 6.66 mg/dL (H)).   Assessment: 70yom with recent admission for NSTEMI, TIA, ?PAF, and HIT - discharged on eliquis with planned duration for 3 months. She is now in acute renal failure and uremic so holding eliquis and giving argatroban. Last eliquis dose 12/10 AM. She was previously therapeutic on argatroban 832mcg/kg/min but since she is now in renal failure, it was started at lower dose of 31mcg/kg/min.  -CBC WNL -Cr remains elevated 6.66 (Baseline ~1)  -Initial aPTT was within therapeutic range at 55 at 0048 on 12/11 -Second aPTT therapeutic at 78 at 0817 on 12/11  Goal of Therapy:  aPTT 50-90 seconds Monitor platelets by anticoagulation protocol: Yes   Plan:  -Cont argatroban 1 mcg/kg/min -12 hour aPTTs at 0500 and 1700 due to renal function   Gwyndolyn KaufmanKai Glynn Yepes Bernette Redbird(Kenny), PharmD  PGY1 Pharmacy Resident Pager: [phone] 01/12/2016 9:53 AM

## 2016-01-12 NOTE — Progress Notes (Signed)
Initial Nutrition Assessment  DOCUMENTATION CODES:   Obesity unspecified  INTERVENTION:  Diet advancement per MD as appropriate.  RD to continue to monitor.   NUTRITION DIAGNOSIS:   Increased nutrient needs related to acute illness as evidenced by estimated needs.  GOAL:   Patient will meet greater than or equal to 90% of their needs  MONITOR:   Diet advancement, Labs, Weight trends, Skin, I & O's  REASON FOR ASSESSMENT:   Malnutrition Screening Tool    ASSESSMENT:   70 year old female presenting to the ED with lethargy, slurred speech, and increased edema. She was previously in ARF requiring HD x 1 in 12/2015. There was some concern at SNF that she may be back in renal failure. Uremia could certainly be a cause of her lethargy. She could also have new stroke with questionable slurred speech and possible right sided facial droop.   Pt was unavailable during attempted time of visit. RD unable to obtain most recent nutrition history. Per MD note, pt with poor PO intake over the past several days due to progressive weakness and lethargy. Pt is currently NPO. Per Epic weight records, pt with a 8.5% weight loss in 2 months. RD to order nutritional supplements as appropriate once diet advances. Unable to complete Nutrition-Focused physical exam at this time. RD to perform physical exam at next visit.   Labs and medications reviewed. Potassium elevated at 5.6.  Diet Order:  Diet NPO time specified  Skin:  Wound (see comment) (Stage II to sacrum)  Last BM:  12/10  Height:   Ht Readings from Last 1 Encounters:  01/04/2016 5\' 3"  (1.6 m)    Weight:   Wt Readings from Last 1 Encounters:  01/08/2016 215 lb (97.5 kg)    Ideal Body Weight:  52.27 kg  BMI:  Body mass index is 38.09 kg/m.  Estimated Nutritional Needs:   Kcal:  1600-1800  Protein:  80-95 grams  Fluid:  Per MD  EDUCATION NEEDS:   No education needs identified at this time  Roslyn SmilingStephanie Bowman Higbie, MS, RD,  LDN Pager # [phone] After hours/ weekend pager # [phone]

## 2016-01-12 NOTE — Progress Notes (Signed)
Pharmacy Antibiotic Note  Heidi Campbell is a 70 y.o. female admitted on 12/30/2015 with possible meningitis.  Pharmacy has been consulted for vancomycin, ampicillin and ceftriaxone dosing.  Patient with acute kidney injury, Scr 6.6 and eCrCl <6110mL/min. Started on sodium bicarbonate infusion per renal service. Encephalopathic and acute decline since last admission in November.   Plan: -Vancomycin 2g (20mg /kg) IV x1 - follow renal function and levels for need to redose (would not anticipate any sooner than 48h) -Ceftriaxone 2g IV q12h -Ampicillin 2g IV q24h -Follow c/s, renal function, clinical progression, MRI results, levels PRN  Height: 5\' 3"  (160 cm) Weight: 215 lb (97.5 kg) IBW/kg (Calculated) : 52.4  Temp (24hrs), Avg:98.9 F (37.2 C), Min:98.6 F (37 C), Max:99.2 F (37.3 C)   Recent Labs Lab 2015/04/07 1615 01/12/16 0817  WBC 14.9* 13.2*  CREATININE 6.06* 6.66*    Estimated Creatinine Clearance: 8.7 mL/min (by C-G formula based on SCr of 6.66 mg/dL (H)).    Allergies  Allergen Reactions  . 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 Ace Inhibitors Cough  . Contrast Media [Iodinated Diagnostic Agents] Other (See Comments)    Reaction unknown  . Latex Other (See Comments)    Unknown reaction per MAR   . Shellfish Allergy Other (See Comments)    Pt cannot recall at this time    Antimicrobials this admission: Vanc 12/11>> Ampicillin 12/11>> CTX 12/11>>  Dose adjustments this admission: n/a  Microbiology results: 12/11 urine: sent 12/11 BCx:  Thank you for allowing pharmacy to be a part of this patient's care.  Valinda Fedie D. Abrish Erny, PharmD, BCPS Clinical Pharmacist Pager: [phone] 01/12/2016 1:58 PM

## 2016-01-12 NOTE — Consult Note (Signed)
Heidi Campbell Admit Date: 01/06/2016 01/12/2016 Heidi Campbell Requesting Physician:  Ophelia Charter MD  Reason for Consult:  AKI, Hyperkalemia, metabolic acidosis, AMS HPI:  Heidi Campbell seen at request of Dr. Ophelia Charter for eval of above issues  PMH Incudes:  L3 decompression 11/07/15, uncomplicated immediate post-operative coruse  Admit 11/3 with AoCKD and hyperkalemia with hypovolemia req HD x1, slow recovery of GFR to recent nadir SCr of 1.4 (perhaps SCr 1-1.4 prior to NSG operation)  12/17/15 Admit NSTEMI and TIA, ? Related ot pAFib, placed on AC and developed TCP concerning for HIIT, changed to argatroban --> NOAC  Recent enterococcal UTI; pansensitive  HTN  Had been at [facility] and re-presented to ED yesterday as family noticed progressive weakness, listlessness, somnolence.  For past several days poor solid/liquid PO intake.  Not on ACEi, diuretics, or NSAIDs it appears prior to admission.    Foud to have significant renal failure / azotemia with acidosis and hyperkalemia. Given 1L NS bolus in ED and blader scan suggested retention, foley placed. UA with +Hb, TNTC WBC, ? Fungus present. Has mild leukocytosis.  Pt remains somnolent, weakened. Has had some c/o neck pain and weakness in legs.    Urine in foley is brown, has sediment   Creat (mg/dL)  Date Value  16/11/9602 1.22 (H)   Creatinine, Ser (mg/dL)  Date Value  54/10/8117 6.66 (H)  01/10/2016 6.06 (H)  12/28/2015 1.51 (H)  12/27/2015 1.45 (H)  12/26/2015 1.35 (H)  12/25/2015 1.63 (H)  12/24/2015 1.40 (H)  12/23/2015 1.35 (H)  12/22/2015 1.52 (H)  12/21/2015 1.55 (H)  ] I/Os: I/O last 3 completed shifts: In: 60 [I.V.:41] Out: 1000 [Urine:1000]   ROS NSAIDS: no exposure IV Contrast no exposure TMP/SMX no exposure Hypotension no exposure Balance of 12 systems is negative w/ exceptions as above  PMH  Past Medical History:  Diagnosis Date  . ALLERGIC RHINITIS   . Arthritis   . ASTHMA   . Asthma   . CKD (chronic  kidney disease), stage IV (HCC)   . Diabetes mellitus without complication (HCC)   . Esophageal reflux   . Hypertension   . NSTEMI (non-ST elevated myocardial infarction) (HCC)   . SINUSITIS, ACUTE    PSH  Past Surgical History:  Procedure Laterality Date  . ABDOMINAL HYSTERECTOMY    . arthroscopic knee Right 2015  . BACK SURGERY    . BREAST REDUCTION SURGERY Bilateral 2006  . BREAST SURGERY    . HAND SURGERY Left    damaged nerve in hand  . IR GENERIC HISTORICAL  12/19/2015   IR FLUORO GUIDE CV LINE RIGHT 12/19/2015 Oley Balm, MD [facility]INTER[facility]  . IR GENERIC HISTORICAL  12/19/2015   IR US GUIDE VASC ACCESS RIGHT [facility]INTER[facility]   FH  Family History  Problem Relation Age of Onset  . Diabetes Mother   . Heart attack Father   . Heart disease Father    SH  reports that she has never smoked. She has never used smokeless tobacco. She reports that she does not drink alcohol or use drugs. Allergies  Allergies  Allergen Reactions  . 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 Ace Inhibitors Cough  . Ampicillin Other (See Comments)    Sensitive per lab paperwork from facility  . Cefazolin Other (See Comments)    Sensitive per lab paperwork from facility  . Cefepime Other (See Comments)    Sensitive per lab paperwork from facility  .  Ceftazidime Other (See Comments)    Sensitive per lab paperwork from facility  . Ceftriaxone Other (See Comments)    Sensitive per lab paperwork from facility  . Ciprofloxacin Other (See Comments)    Sensitive per lab paperwork from facility  . Contrast Media [Iodinated Diagnostic Agents] Other (See Comments)    Reaction unknown  . Ertapenem Other (See Comments)    Sensitive per lab paperwork from facility  . Gentamicin Other (See Comments)    Sensitive per lab paperwork from facility  . Imipenem Other (See Comments)    Sensitive per lab paperwork from facility  . Latex Other (See Comments)    Unknown  reaction per MAR   . Levofloxacin Other (See Comments)    Sensitive per lab paperwork from facility  . Nitrofuran Derivatives Other (See Comments)    Sensitive per lab paperwork from facility  . Shellfish Allergy Other (See Comments)    Pt cannot recall at this time  . Tobramycin Other (See Comments)    Sensitive per lab paperwork from facility  . Zosyn [Piperacillin Sod-Tazobactam So] Other (See Comments)    Sensitive per lab paperwork from facility   Home medications Prior to Admission medications   Medication Sig Start Date End Date Taking? Authorizing Provider  acetaminophen (TYLENOL) 325 MG tablet Take 650 mg by mouth 3 (three) times daily. And every 4 hours as needed for pain/fever   Yes Historical Provider, MD  Amino Acids-Protein Hydrolys (FEEDING SUPPLEMENT, PRO-STAT SUGAR FREE 64,) LIQD Take 30 mLs by mouth 2 (two) times daily.   Yes Historical Provider, MD  amLODipine (NORVASC) 10 MG tablet Take 1 tablet (10 mg total) by mouth daily. 07/26/14  Yes Donnetta HutchingBrian Cook, MD  apixaban (ELIQUIS) 5 MG TABS tablet Take 1 tablet (5 mg total) by mouth 2 (two) times daily. 12/28/15  Yes Mauricio Annett Gulaaniel Arrien, MD  carvedilol (COREG) 3.125 MG tablet Take 1 tablet (3.125 mg total) by mouth 2 (two) times daily with a meal. 12/28/15  Yes Mauricio Annett Gulaaniel Arrien, MD  doxazosin (CARDURA) 4 MG tablet Take 1 tablet (4 mg total) by mouth at bedtime. 12/28/15  Yes Mauricio Annett Gulaaniel Arrien, MD  ezetimibe (ZETIA) 10 MG tablet Take 1 tablet (10 mg total) by mouth daily. 07/20/13  Yes Reather LittlerAjay Kumar, MD  ferrous sulfate 325 (65 FE) MG tablet Take 1 tablet (325 mg total) by mouth 3 (three) times daily with meals. 12/28/15  Yes Mauricio Annett Gulaaniel Arrien, MD  Fluticasone-Salmeterol (ADVAIR) 100-50 MCG/DOSE AEPB Inhale 1 puff into the lungs 2 (two) times daily.    Yes Historical Provider, MD  gabapentin (NEURONTIN) 400 MG capsule Take 400 mg by mouth 3 (three) times daily.   Yes Historical Provider, MD  hydrALAZINE (APRESOLINE)  50 MG tablet Take 1 tablet by mouth 3 times a day. Patient taking differently: Take 50 mg by mouth 3 (three) times daily.  10/17/14  Yes Corky CraftsJayadeep S Varanasi, MD  Insulin Glargine (LANTUS) 100 UNIT/ML Solostar Pen Inject 10 Units into the skin daily at 10 pm. Patient taking differently: Inject 14 Units into the skin daily at 10 pm.  12/12/15  Yes Alison MurrayAlma M Devine, MD  omeprazole (PRILOSEC) 20 MG capsule Take 20 mg by mouth daily at 6 (six) AM.    Yes Historical Provider, MD  rosuvastatin (CRESTOR) 40 MG tablet Take 1 tablet (40 mg total) by mouth daily. Patient taking differently: Take 40 mg by mouth at bedtime.  12/22/12  Yes Corky CraftsJayadeep S Varanasi, MD  albuterol (PROVENTIL HFA;VENTOLIN HFA)  108 (90 BASE) MCG/ACT inhaler Inhale 2 puffs into the lungs every 6 (six) hours as needed for wheezing or shortness of breath.    Historical Provider, MD  bisacodyl (DULCOLAX) 10 MG suppository Place 10 mg rectally every 4 (four) hours as needed for moderate constipation.     Historical Provider, MD  fluticasone (FLONASE) 50 MCG/ACT nasal spray Place 1 spray into both nostrils daily as needed for allergies.     Historical Provider, MD  nitroGLYCERIN (NITROSTAT) 0.4 MG SL tablet Place 0.4 mg under the tongue every 5 (five) minutes as needed for chest pain.    Historical Provider, MD  ondansetron (ZOFRAN ODT) 4 MG disintegrating tablet Take 1 tablet (4 mg total) by mouth every 8 (eight) hours as needed for nausea or vomiting. 01/27/15   Shawn C Joy, PA-C  polyethylene glycol (MIRALAX / GLYCOLAX) packet Take 17 g by mouth daily as needed (For constipation.).     Historical Provider, MD    Current Medications Scheduled Meds: . chlorhexidine  15 mL Mouth Rinse BID  . insulin aspart  0-9 Units Subcutaneous TID WC  . insulin glargine  10 Units Subcutaneous Q2200  . mouth rinse  15 mL Mouth Rinse q12n4p  . sodium chloride flush  3 mL Intravenous Q12H   Continuous Infusions: . argatroban 1 mcg/kg/min (01/12/16 0526)  .   sodium bicarbonate  infusion 1000 mL     PRN Meds:.acetaminophen **OR** acetaminophen, albuterol, bisacodyl, camphor-menthol **AND** hydrOXYzine, docusate sodium, metoprolol, ondansetron **OR** ondansetron (ZOFRAN) IV, sorbitol  CBC  Recent Labs Lab 01/03/2016 1615 01/12/16 0817  WBC 14.9* 13.2*  HGB 11.4* 10.6*  HCT 35.1* 32.6*  MCV 85.4 85.1  PLT 304 286   Basic Metabolic Panel  Recent Labs Lab 01/30/2016 1615 01/12/16 0817  NA 132* 132*  K 5.5* 5.6*  CL 101 101  CO2 17* 16*  GLUCOSE 167* 116*  BUN 149* 158*  CREATININE 6.06* 6.66*  CALCIUM 8.4* 8.3*    Physical Exam  Blood pressure (!) 121/46, pulse 63, temperature 99 F (37.2 C), temperature source Axillary, resp. rate 16, height 5\' 3"  (1.6 m), weight 97.5 kg (215 lb), SpO2 98 %. GEN: NAD, but sluggish and doesn't respond to questions very much; somnolent ENT: NCAT EYES: EOMI CV: RRR PULM: CTAB ABD: mild TTP in SP region, no RUQ Tenderness. +BS SKIN: no rashes/lesions EXT:No LEE   Assessment 75F presenting with progressive AMS, weakness, debility after lumbar spinal surgery 11/2015 followed by AKI, NSTEMI; found to have sig azotemia, hyperkalemia, and likely UTI with ? Fungal component  1. AoCKD 1. Prev admission with severe AKI req HD x1, recovered 2. SCr was 1.4-1.5 prior to admission 3. Renal US ta admission was neg for acute structural issues, L 7cm? 4. UA with TNTC WBC, Turbid, +Hb, and fungus reported 5. Had UOP at time of foley placement 6. Weighs 5.5kg less than recent discharge with endorsed poor PO and liquid intake in days preceding this admission 7. Working theory is hypovolemia / pre-renal with concominant UTI 2. Suspected UTI, perhaps fungal 3. AMS, Weakness 4. Hyperkalemia, mild, EKG w/o changes 5. Metabolic Acidosis, somewhat high AG likley related #1 6. Recent NSG lumbar spinal procedure, neck pain, ? LE weakness, and L urinary retention 7. Leukocytosis 8. 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 AFIb, was on NOAC; held 11. Recent NSTEMI 12. Recent TIA  Plan 1. Trial of aggressive hydration with NaHCO3 given acidosis and hyperkalemia 2. Cont foley 3. TRH to  start on ABX for UTI, ? CNS/Spinal proces 4. Further eval of spine  5. BID labs 6. Daily weights, Strict I/Os, Avoid nephrotoxins (NSAIDs, judicious IV Contrast)   Sabra Heckyan Temperance Kelemen MD [phone] pgr 01/12/2016, 12:46 PM

## 2016-01-12 NOTE — Progress Notes (Signed)
PROGRESS NOTE    BRIONA KORPELA  NUU:[id number] DOB: 11-Nov-1945 DOA: 01/22/2016 PCP: Astrid Divine, MD   Brief Narrative:  70 y.o. BF  PMHx Diabetes mellitus type II uncontrolled with complication , HTN, NSTEMI, CKD (chronic kidney disease), stage IV (baseline Cr= 0.98-1.7 but has been as high as 10), arising after an uncomplicated back surgery on 10/6.  She was most recently admitted from 11/15-26, discharged to [facility] (see below).  She has done fairly well until this week.  Over the last week, she has been much more tired, unable to stay awake, complaining of arms hurting and unable to hold them up, speech slurred and incoherent.  Stopped eating.  Gradual progressive issue.  By 2022-06-15, her daughter was feeding her (she was previously able to feed herself).  Family sees her daily.  On Wednesday they noticed subtle changes but more pronounced 06-15-2022.  Today, she was unable to stay awake long enough to even chew food that was placed in her mouth.  She is essentially unresponsive.  Responds and then goes immediately back to sleep.    Initially hospitalized 10/6-9/17 with degenerative spondylolisthesis.  Uncomplicated L3 for decompression infusion, did well, discharged home.  Returned on 10/21 with back pain.  Found to have a small seroma/hematoma.  Discharged to home.  Returned again on 10/23 with back pain.  The ER helped to facilitate placement into rehab at that time.  She was brought in again on 11/3 for AMS.  She was admitted from 11/3-10 for acute renal failure superimposed on CKD stage 3 thought to be the result of volume depletion from GI losses with hypotension.  Creatinine went from 1.39 to 8.7 with hyperkalemia to 8.5.  She was taken for emergent hemodialysis and her creatinine improved after 1 session.  She was diagnosed with a UTI from Enterococcus faecalis and was treated with vancomycin.  She required no further HD and was discharged back to [facility] on 11/10 (with  a creatinine of 2.88).  She was sent back to the ER for removal of her HD catheter on 11/14 following repeat labs which indicated no further need for dialysis.    She then returned again with chest pain on 11/15 and was admitted through 11/26.  That was her most extensive hospitalization, with an NSTEMI as well as a TIA, complicated by development of heparin-induced thrombocytopenia.   She was possibly in afib although this was never captured; the recommendation was for anticoagulation for 3 months.  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  She did have renal insufficiency at the time of admission with creatinine 2.63, but the creatinine was down to 1.51 on 11/26.   ED Course: Per Dr. Nancy Marus: Alaysia is a 70 year old female presenting to the ED with lethargy, slurred speech, and increased edema. She was previously in ARF requiring HD x 1 in 12/2015. There was some concern at SNF that she may be back in renal failure. Uremia could certainly be a cause of her lethargy. She could also have new stroke with questionable slurred speech and possible right sided facial droop. Think infection is less likely, as patient is not meeting any SIRS criteria. Will order CBC, CMET, ABG, ammonia, ethanol, UDS, UA, CXR, and CT head for initial AMS work-up.  4:35PM: Initial ABG with pH 7.365, pCO2 28.7, pO2 91. CXR neg.  5:20PM: CT head negative.  5:50PM: CMP back with Cr 6.06, BUN 149, K 5.5. Will give  a 1L bolus.  5:55PM: Bladder scan with >87900ml. Will place a foley.  6:20PM: Spoke with [facility] who will admit. They are requesting that we call Nephrology. I spoke with Dr. Briant CedarMattingly, who states they will see her tomorrow.    Subjective: 12/11 A/O 1 (does not know where, when, why). Follows commands. Patient immediately falls back to sleep when not engaged. Per son Gerlene BurdockRichard since discharge on 11/26 patient was ambulating with walker at  rehabilitation facility able to perform ADLs over the last several days has become more lethargic, increasing bilateral lower extremity weakness, increasing altered mental status. At that point the family decided patient needed to be be admitted to the hospital and called 911.     Assessment & Plan:   Principal Problem:   Acute renal failure (ARF) (HCC) Active Problems:   Type 2 diabetes mellitus treated with insulin (HCC)   Essential hypertension   HIT (heparin-induced thrombocytopenia) (HCC)   Acute encephalopathy   Current use of long term anticoagulation   Hyperkalemia   Elevated LFTs  Sepsis unspecified organism -Although patient doesn't meet criteria, official guidelines her elevated leukocytosis, coupled with recent spinal surgery and altered mental status mainly stranding suspicious for infective cause. -Will start on broad-spectrum antibiotics and narrow as able. -Urine and blood cultures pending  Meningitis? vs abscess -Patient with altered mental status, positive Brudzinski sign.  -Obtain MRI brain and L-spine. Note on previous hospitalization in November patient had hematoma/seroma around surgical site will evaluate for abscess given patient's urinary retention and bilateral lower extremity weakness -Dependent upon findings of MRI will need to consult neurosurgery/neurology  UTI -Urine culture pending. Current antibiotics will treat most common infections.  Acute on chronic renal failure -Nephrology Dr. Marisue HumbleSanford aware of patient will await further recommendations. -Normal saline 150 ml/hr  Chronic Diastolic CHF -Strict in and out -Daily weight -Restart BP medication when patient's mental status improved. If patient becomes hypertensive and unable to take PO meds start on metoprolol IV  Pulmonary hypertension --See chronic CHF    DVT prophylaxis: Argatroban Code Status: Full Family Communication: Spoke with son Gerlene BurdockRichard [phone], stated his sister Selena BattenKim is  HCPOA Disposition Plan:    Consultants:  Dr. Marisue [facility]     Procedures/Significant Events:  11/24 Echocardiogram: LVEF=  60% to 65%. -(grade 2 diastolic dysfunction). - Pulmonary arteries: PA peak pressure: 33 mm Hg (S).   VENTILATOR SETTINGS: NA   Cultures 12/11 urine pending 12/11 blood pending   Antimicrobials: Ceftriaxone 12/11>>  Ampicillin 12/11>> Vancomycin 12/11>>    Devices NA   LINES / TUBES:  NA    Continuous Infusions: . argatroban 1 mcg/kg/min (01/12/16 0526)     Objective: Vitals:   April 21, 2015 2044 April 21, 2015 2300 01/12/16 0458 01/12/16 0907  BP: (!) 117/56 (!) 129/51 (!) 112/48 (!) 121/46  Pulse: 74 68 65 63  Resp: 15 14 16 16   Temp: 98.6 F (37 C)  98.9 F (37.2 C) 99 F (37.2 C)  TempSrc: Oral  Oral Axillary  SpO2: 98% 99% 98% 98%  Weight: 97.5 kg (215 lb)     Height: 5\' 3"  (1.6 m)       Intake/Output Summary (Last 24 hours) at 01/12/16 1146 Last data filed at 01/12/16 0900  Gross per 24 hour  Intake               41 ml  Output             1000 ml  Net             -  959 ml   Filed Weights   01/21/2016 2044  Weight: 97.5 kg (215 lb)    Examination:  General: A/O 1 (does not know where, when, why). Follows all commands. No acute respiratory distress Eyes: negative scleral hemorrhage, negative anisocoria, negative icterus ENT: Negative Runny nose, negative gingival bleeding, Neck:  Pain to palpation midline, positive Brudzinski sign, Negative scars, masses, torticollis, lymphadenopathy, JVD Lungs: Clear to auscultation bilaterally without wheezes or crackles Cardiovascular: Regular rate and rhythm without murmur gallop or rub normal S1 and S2 Abdomen: Morbidly obese, negative abdominal pain, nondistended, positive soft, bowel sounds, no rebound, no ascites, no appreciable mass Extremities: No significant cyanosis, clubbing, or edema bilateral lower extremities Skin: Negative rashes, lesions, ulcers Psychiatric:   Negative depression, negative anxiety, negative fatigue, negative mania  Central nervous system:  Cranial nerves II through XII intact, tongue/uvula midline, bilateral upper extremity strength 3/5, bilateral lower extremity strength 1-2/5, sensation intact throughout, unable to perform finger nose finger bilateral or quick finger touch bilateral negative dysarthria, negative expressive aphasia, negative receptive aphasia.  .     Data Reviewed: Care during the described time interval was provided by me .  I have reviewed this patient's available data, including medical history, events of note, physical examination, and all test results as part of my evaluation. I have personally reviewed and interpreted all radiology studies.  CBC:  Recent Labs Lab 01/16/2016 1615 01/12/16 0817  WBC 14.9* 13.2*  HGB 11.4* 10.6*  HCT 35.1* 32.6*  MCV 85.4 85.1  PLT 304 286   Basic Metabolic Panel:  Recent Labs Lab 01/25/2016 1615 01/12/16 0817  NA 132* 132*  K 5.5* 5.6*  CL 101 101  CO2 17* 16*  GLUCOSE 167* 116*  BUN 149* 158*  CREATININE 6.06* 6.66*  CALCIUM 8.4* 8.3*   GFR: Estimated Creatinine Clearance: 8.7 mL/min (by C-G formula based on SCr of 6.66 mg/dL (H)). Liver Function Tests:  Recent Labs Lab 01/07/2016 1615 01/12/16 0817  AST 917* 913*  ALT 453* 441*  ALKPHOS 117 109  BILITOT 0.5 0.8  PROT 6.2* 5.7*  ALBUMIN 2.3* 2.0*   No results for input(s): LIPASE, AMYLASE in the last 168 hours.  Recent Labs Lab 01/13/2016 1615  AMMONIA 54*   Coagulation Profile: No results for input(s): INR, PROTIME in the last 168 hours. Cardiac Enzymes: No results for input(s): CKTOTAL, CKMB, CKMBINDEX, TROPONINI in the last 168 hours. BNP (last 3 results) No results for input(s): PROBNP in the last 8760 hours. HbA1C: No results for input(s): HGBA1C in the last 72 hours. CBG:  Recent Labs Lab 01/17/2016 2053 01/19/2016 2256 01/12/16 0752  GLUCAP 150* 139* 122*   Lipid Profile: No  results for input(s): CHOL, HDL, LDLCALC, TRIG, CHOLHDL, LDLDIRECT in the last 72 hours. Thyroid Function Tests: No results for input(s): TSH, T4TOTAL, FREET4, T3FREE, THYROIDAB in the last 72 hours. Anemia Panel: No results for input(s): VITAMINB12, FOLATE, FERRITIN, TIBC, IRON, RETICCTPCT in the last 72 hours. Urine analysis:    Component Value Date/Time   COLORURINE AMBER (A) 01/12/2016 0456   APPEARANCEUR TURBID (A) 01/12/2016 0456   LABSPEC 1.012 01/12/2016 0456   PHURINE 5.0 01/12/2016 0456   GLUCOSEU NEGATIVE 01/12/2016 0456   GLUCOSEU NEGATIVE 07/03/2013 0856   HGBUR LARGE (A) 01/12/2016 0456   BILIRUBINUR NEGATIVE 01/12/2016 0456   BILIRUBINUR Neg 08/06/2014 1058   KETONESUR NEGATIVE 01/12/2016 0456   PROTEINUR 100 (A) 01/12/2016 0456   UROBILINOGEN 0.2 08/06/2014 1058   UROBILINOGEN 1.0 07/26/2014 1804  NITRITE NEGATIVE 01/12/2016 0456   LEUKOCYTESUR MODERATE (A) 01/12/2016 0456   Sepsis Labs: @LABRCNTIP (procalcitonin:4,lacticidven:4)  )No results found for this or any previous visit (from the past 240 hour(s)).       Radiology Studies: Ct Head Wo Contrast  Result Date: 01/18/2016 CLINICAL DATA:  Lethargy. EXAM: CT HEAD WITHOUT CONTRAST TECHNIQUE: Contiguous axial images were obtained from the base of the skull through the vertex without intravenous contrast. COMPARISON:  12/24/2015 head CT. FINDINGS: Brain: No evidence of parenchymal hemorrhage or extra-axial fluid collection. No mass lesion, mass effect, or midline shift. No CT evidence of acute infarction. Intracranial atherosclerosis. Cerebral volume is age appropriate. No ventriculomegaly. Vascular: No hyperdense vessel or unexpected calcification. Skull: No evidence of calvarial fracture. Sinuses/Orbits: The visualized paranasal sinuses are essentially clear. Other:  The mastoid air cells are unopacified. IMPRESSION: Negative head CT.  No evidence of acute intracranial abnormality. Electronically Signed   By:  Delbert Phenix M.D.   On: 02/01/2016 17:20   US Renal  Result Date: 01/12/2016 CLINICAL DATA:  71 year old female with diabetes and chronic kidney disease presenting with urinary retention. EXAM: RENAL / URINARY TRACT ULTRASOUND COMPLETE COMPARISON:  Ultrasound dated 12/06/2015 FINDINGS: Right Kidney: Length: 10.8 cm. Mildly echogenic. No mass or hydronephrosis visualized. Left Kidney: Length: 7.0 cm. Mildly echogenic. No mass or hydronephrosis visualized. Bladder: The urinary bladder is decompressed around a Foley catheter. IMPRESSION: No hydronephrosis or echogenic stone. Mildly echogenic kidneys. Electronically Signed   By: Elgie Collard M.D.   On: 01/12/2016 02:19   Dg Chest Portable 1 View  Result Date: 01/10/2016 CLINICAL DATA:  Weakness. EXAM: PORTABLE CHEST 1 VIEW COMPARISON:  December 17, 2015 FINDINGS: Stable cardiomegaly. Tortuous thoracic aorta, probably unchanged given difference in technique. No pneumothorax. No pulmonary nodules, masses, or focal infiltrates. IMPRESSION: No active disease. Electronically Signed   By: Gerome Sam III M.D   On: 01/17/2016 16:39        Scheduled Meds: . chlorhexidine  15 mL Mouth Rinse BID  . insulin aspart  0-9 Units Subcutaneous TID WC  . insulin glargine  10 Units Subcutaneous Q2200  . mouth rinse  15 mL Mouth Rinse q12n4p  . sodium chloride flush  3 mL Intravenous Q12H   Continuous Infusions: . argatroban 1 mcg/kg/min (01/12/16 0526)     LOS: 1 day    Time spent: 40 minutes    Charlis Harner, Roselind Messier, MD [facility] Pager [phone]   If 7PM-7AM, please contact night-coverage [email or web address] Password [email or web address] 01/12/2016, 11:46 AM

## 2016-01-13 ENCOUNTER — Inpatient Hospital Stay (HOSPITAL_COMMUNITY): Payer: Medicare Other

## 2016-01-13 DIAGNOSIS — N3000 Acute cystitis without hematuria: Secondary | ICD-10-CM

## 2016-01-13 LAB — COMPREHENSIVE METABOLIC PANEL
ALBUMIN: 2 g/dL — AB (ref 3.5–5.0)
ALK PHOS: 121 U/L (ref 38–126)
ALT: 513 U/L — AB (ref 14–54)
AST: 1133 U/L — AB (ref 15–41)
Anion gap: 18 — ABNORMAL HIGH (ref 5–15)
BILIRUBIN TOTAL: 0.5 mg/dL (ref 0.3–1.2)
BUN: 157 mg/dL — AB (ref 6–20)
CALCIUM: 8 mg/dL — AB (ref 8.9–10.3)
CO2: 18 mmol/L — ABNORMAL LOW (ref 22–32)
CREATININE: 6.91 mg/dL — AB (ref 0.44–1.00)
Chloride: 100 mmol/L — ABNORMAL LOW (ref 101–111)
GFR calc Af Amer: 6 mL/min — ABNORMAL LOW (ref >60)
GFR calc non Af Amer: 5 mL/min — ABNORMAL LOW (ref >60)
GLUCOSE: 128 mg/dL — AB (ref 65–99)
POTASSIUM: 5 mmol/L (ref 3.5–5.1)
Sodium: 136 mmol/L (ref 135–145)
TOTAL PROTEIN: 5.7 g/dL — AB (ref 6.5–8.1)

## 2016-01-13 LAB — GLUCOSE, CAPILLARY
Glucose-Capillary: 126 mg/dL — ABNORMAL HIGH (ref 65–99)
Glucose-Capillary: 111 mg/dL — ABNORMAL HIGH (ref 65–99)
Glucose-Capillary: 122 mg/dL — ABNORMAL HIGH (ref 65–99)
Glucose-Capillary: 140 mg/dL — ABNORMAL HIGH (ref 65–99)

## 2016-01-13 LAB — URINE CULTURE

## 2016-01-13 LAB — CBC
HCT: 31.5 % — ABNORMAL LOW (ref 36.0–46.0)
HEMOGLOBIN: 10.2 g/dL — AB (ref 12.0–15.0)
MCH: 27.9 pg (ref 26.0–34.0)
MCHC: 32.4 g/dL (ref 30.0–36.0)
MCV: 86.1 fL (ref 78.0–100.0)
Platelets: 259 10*3/uL (ref 150–400)
RBC: 3.66 MIL/uL — AB (ref 3.87–5.11)
RDW: 15.5 % (ref 11.5–15.5)
WBC: 13.5 10*3/uL — ABNORMAL HIGH (ref 4.0–10.5)

## 2016-01-13 LAB — APTT
aPTT: 82 seconds — ABNORMAL HIGH (ref 24–36)
aPTT: 81 seconds — ABNORMAL HIGH (ref 24–36)

## 2016-01-13 MED ORDER — SODIUM BICARBONATE 8.4 % IV SOLN
INTRAVENOUS | Status: DC
  Administered 2016-01-13: 18:00:00 via INTRAVENOUS
  Filled 2016-01-13 (×3): qty 150

## 2016-01-13 MED ORDER — INSULIN GLARGINE 100 UNIT/ML ~~LOC~~ SOLN
5.0000 [IU] | Freq: Every day | SUBCUTANEOUS | Status: DC
  Filled 2016-01-13 (×2): qty 0.05

## 2016-01-13 NOTE — Progress Notes (Signed)
Progress Note    Heidi Campbell  ZDG:[id number] DOB: 06/25/1945  DOA: 01/06/2016 PCP: Astrid Divine, MD    Brief Narrative:   Chief complaint: Follow-up mental status changes.  Heidi Campbell is an 70 y.o. female with a PMH of uncontrolled type 2 diabetes with complications, hypertension, stage IV CKD, NSTEMI, multiple recent hospitalizations with placement at a SNF who returned to the hospital 01/14/2016 for evaluation of mental status changes.  Extensive review of her chart done as the patient is very complicated. She was initially admitted from 11/07/15-11/10/15 for neurosurgical decompression for degenerative spondylolisthesis with stenosis. She was rehospitalized 12/05/15-12/12/15 with acute renal failure requiring HD. She was noted to have a surgical wound infection at that time. MRI done at that time was without evidence of discitis or osteomyelitis and neurosurgery did not feel that there was any evidence of deep infection. She was treated with vancomycin for enterococcal UTI. Her discharge creatinine was 2.88 (6.09 on admission). She then was rehospitalized 12/17/15-12/28/15 for evaluation of chest pain. She was found to have an NSTEMI with a troponin of 0.51. Creatinine was 2.63 on readmission. She was noted to be thrombocytopenic with platelet count of 51. 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 During her hospitalization, she also had a TIA with a negative MRI/MRA. 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 Discharge creatinine was 1.51. She was brought back to the hospital 01/03/2016 with mental status changes. Creatinine on admission was 6.06. She was also found to have marked elevation of her LFTs.  Assessment/Plan:   Principal Problem:   Acute renal failure with acute tubular necrosis superimposed on stage 4  chronic kidney disease (HCC) Being followed by nephrology with thought that this may be from a fungal UTI (only 50,000 colonies which is not entirely consistent with a fungal UTI) versus a prerenal etiology. The patient has a Foley catheter in place. We'll continue hydration and monitor for recovery of renal function. Suspect she will need HD again.    Rule out sepsis rule out bacterial meningitis versus UTI Given recent lumbar surgery, there is concern for sepsis. MRI of the brain and spine have not revealed a source of abscess or any evidence of infection however. Patient is being covered for meningitis with ampicillin, vancomycin and Rocephin. Follow-up urine culture. Follow-up blood cultures. Will get a wound care consultation for evaluation of her post surgical wound.  Active Problems:   Type 2 diabetes mellitus treated with insulin (HCC) Currently being managed with insulin sensitive SSI 3 times a day and 10 units of Lantus daily. CBGs 88-126. The patient is still nothing by mouth, so will reduce her Lantus to 5 units.     Essential hypertension Continue metoprolol as needed for elevated blood pressure.    HIT (heparin-induced thrombocytopenia) (HCC) Platelets within normal limits. Continue argatroban.    Acute encephalopathy Unclear if this is sepsis or uremia or combination of both. Mental status continues to be altered.    Current use of long term anticoagulation Remains on argatroban given recent NSTEMI/TIA and concern for afib in the setting of HIT. Has maintained normal sinus rhythm on telemetry. Will need to remain on blood thinners for at least 3 months.    Hyperkalemia Continue sodium bicarbonate infusion. No EKG changes. Unable to take oral medications at this time.    Elevated LFTs  Right upper quadrant ultrasound done today for further evaluation, appeared normal. LFT elevation is in a hepatocellular pattern. Discussed case with Dr. Bosie Clos who thinks this may be passive  congestion of the liver. Unable to perform contrasted CT of the abdomen at this time due to her renal failure.    Chronic diastolic CHF (congestive heart failure) (HCC) Monitor volume status closely.    Pulmonary hypertension    Family Communication/Anticipated D/C date and plan/Code Status   DVT prophylaxis: Argatroban ordered. Code Status: Full Code.  Family Communication: No family at bedside. Disposition Plan: Not ready for discharge, likely will not be ready for several more days.   Medical Consultants:    Nephrology  Gastroenterology   Procedures:    None  Anti-Infectives:    Ampicillin 01/12/16--->  Vancomycin 01/12/16--->  Rocephin 01/12/16--->  Subjective:   The patient remains altered and somnolent. She says she "feels better ", denies dyspnea. She denies pain. She denies nausea/vomiting.  Objective:    Vitals:   01/12/16 1620 01/12/16 2223 01/13/16 0634 01/13/16 1100  BP: (!) 114/41 (!) 108/40 (!) 138/51 (!) 128/51  Pulse: 65 68 75 77  Resp:  16 16 18   Temp:  99 F (37.2 C) 98.7 F (37.1 C) 98.5 F (36.9 C)  TempSrc:  Oral Oral Oral  SpO2: 97% 97% 99% 96%  Weight:      Height:        Intake/Output Summary (Last 24 hours) at 01/13/16 1613 Last data filed at 01/13/16 1300  Gross per 24 hour  Intake          1321.11 ml  Output              400 ml  Net           921.11 ml   Filed Weights   01/10/2016 2044  Weight: 97.5 kg (215 lb)    Exam: General exam: Appears mildly toxic and somnolent. Respiratory system: Congested in the upper airways. Respiratory effort normal. Cardiovascular system: S1 & S2 heard, RRR. No JVD,  rubs, gallops or clicks. No murmurs. Gastrointestinal system: Abdomen is nondistended, soft and nontender. No organomegaly or masses felt. Normal bowel sounds heard. Central nervous system: Somnolent/disoriented. No focal neurological deficits. Extremities: No clubbing,  or cyanosis. 1+ edema. Skin: No rashes, lesions  or ulcers. Psychiatry: Judgement and insight appear impaired. Mood & affect flat.   Data Reviewed:   I have personally reviewed following labs and imaging studies:  Labs: Basic Metabolic Panel:  Recent Labs Lab 01/23/2016 1615 01/12/16 0817 01/12/16 1637 01/13/16 1010  NA 132* 132* 134* 136  K 5.5* 5.6* 5.7* 5.0  CL 101 101 103 100*  CO2 17* 16* 16* 18*  GLUCOSE 167* 116* 106* 128*  BUN 149* 158* 158* 157*  CREATININE 6.06* 6.66* 6.73* 6.91*  CALCIUM 8.4* 8.3* 8.3* 8.0*   GFR Estimated Creatinine Clearance: 8.4 mL/min (by C-G formula based on SCr of 6.91 mg/dL (H)). Liver Function Tests:  Recent Labs Lab 01/24/2016 1615 01/12/16 0817 01/13/16 1010  AST 917* 913* 1,133*  ALT 453* 441* 513*  ALKPHOS 117 109 121  BILITOT 0.5 0.8 0.5  PROT 6.2* 5.7* 5.7*  ALBUMIN 2.3* 2.0* 2.0*   No results for input(s): LIPASE, AMYLASE in the last 168 hours.  Recent Labs Lab 01/25/2016 1615  AMMONIA 54*   CBC:  Recent Labs Lab 01/10/2016 1615 01/12/16 0817 01/13/16 0444  WBC 14.9* 13.2* 13.5*  HGB 11.4* 10.6* 10.2*  HCT 35.1* 32.6* 31.5*  MCV 85.4 85.1 86.1  PLT 304 286 259   CBG:  Recent Labs Lab 01/12/16 1206 01/12/16 1706 01/12/16 2221 01/13/16 0817 01/13/16 1229  GLUCAP 111* 91 88 126* 111*   Microbiology Recent Results (from the past 240 hour(s))  Culture, Urine     Status: Abnormal   Collection Time: 01/12/16  4:56 AM  Result Value Ref Range Status   Specimen Description URINE, RANDOM  Final   Special Requests NONE  Final   Culture 50,000 COLONIES/mL YEAST (A)  Final   Report Status 01/13/2016 FINAL  Final  Culture, blood (routine x 2)     Status: None (Preliminary result)   Collection Time: 01/12/16 12:54 PM  Result Value Ref Range Status   Specimen Description BLOOD LEFT ARM  Final   Special Requests IN PEDIATRIC BOTTLE 1.0 ML  Final   Culture NO GROWTH 1 DAY  Final   Report Status PENDING  Incomplete  Culture, blood (routine x 2)     Status: None  (Preliminary result)   Collection Time: 01/12/16 12:54 PM  Result Value Ref Range Status   Specimen Description BLOOD LEFT ARM  Final   Special Requests IN PEDIATRIC BOTTLE 1ML  Final   Culture NO GROWTH 1 DAY  Final   Report Status PENDING  Incomplete    Radiology: Ct Head Wo Contrast  Result Date: 01/29/2016 CLINICAL DATA:  Lethargy. EXAM: CT HEAD WITHOUT CONTRAST TECHNIQUE: Contiguous axial images were obtained from the base of the skull through the vertex without intravenous contrast. COMPARISON:  12/24/2015 head CT. FINDINGS: Brain: No evidence of parenchymal hemorrhage or extra-axial fluid collection. No mass lesion, mass effect, or midline shift. No CT evidence of acute infarction. Intracranial atherosclerosis. Cerebral volume is age appropriate. No ventriculomegaly. Vascular: No hyperdense vessel or unexpected calcification. Skull: No evidence of calvarial fracture. Sinuses/Orbits: The visualized paranasal sinuses are essentially clear. Other:  The mastoid air cells are unopacified. IMPRESSION: Negative head CT.  No evidence of acute intracranial abnormality. Electronically Signed   By: Delbert PhenixJason A Poff M.D.   On: 01/12/2016 17:20   Mr Brain Wo Contrast  Result Date: 01/12/2016 CLINICAL DATA:  Altered mental status, extremity weakness and urinary retention after recent lumbar spine surgery. History of TIA, hypertension and diabetes. EXAM: MRI HEAD WITHOUT CONTRAST TECHNIQUE: Multiplanar, multiecho pulse sequences of the brain and surrounding structures were obtained without intravenous contrast. COMPARISON:  CT HEAD January 11, 2016 and MRI of the head December 24, 2015 FINDINGS: BRAIN: No reduced diffusion to suggest acute ischemia. No susceptibility artifact to suggest hemorrhage (prior extra-axial susceptibility weighted artifact attributed to 3 tesla technique). The ventricles and sulci are normal for patient's age. No suspicious parenchymal signal, masses or mass effect. Minimal  supratentorial and pontine white matter FLAIR T2 hyperintensities compatible with chronic small vessel ischemic disease, less than expected for age. No abnormal extra-axial fluid collections. No extra-axial masses though, contrast enhanced sequences would be more sensitive. VASCULAR: Normal major intracranial vascular flow voids present at skull base. SKULL AND UPPER CERVICAL SPINE: Fluid-filled expanded partially empty sella. No suspicious calvarial bone marrow signal. Craniocervical junction maintained. Trace LEFT temporomandibular joint effusion. SINUSES/ORBITS: The mastoid air-cells and included paranasal sinuses are well-aerated. Subcentimeter LEFT nasal mucosal retention cyst. The included ocular globes and orbital contents are non-suspicious. OTHER: None. IMPRESSION: No acute intracranial process. Partially empty sella, otherwise negative MRI of the head for age. Electronically Signed   By: Awilda Metroourtnay  Bloomer M.D.   On: 01/12/2016 19:19   Mr Cervical  Spine Wo Contrast  Result Date: 01/12/2016 CLINICAL DATA:  Recent lumbar surgery. Altered mental status. Bilateral lower extremity weakness and urinary retention. Bilateral upper extremity weakness. EXAM: MRI CERVICAL, THORACIC AND LUMBAR SPINE WITHOUT CONTRAST TECHNIQUE: Multiplanar and multiecho pulse sequences of the cervical spine, to include the craniocervical junction and cervicothoracic junction, and thoracic and lumbar spine, were obtained without intravenous contrast. COMPARISON:  Lumbar spine MRI 11/23/2015 FINDINGS: MRI CERVICAL SPINE FINDINGS Alignment: Normal Vertebrae: No acute compression fracture, facet edema or focal marrow lesion. Cord: Normal signal and caliber Posterior Fossa, vertebral arteries, paraspinal tissues: Negative Disc levels: At C5-C6, there is a small disc osteophyte complex that narrows the ventral thecal sac and causes mild-to-moderate bilateral foraminal narrowing. Otherwise, there is no cervical spinal canal or foraminal  stenosis. MRI THORACIC SPINE FINDINGS Alignment:  Normal Vertebrae: Suspected inferior endplate hemangioma at T7. Normal signal and caliber Paraspinal and other soft tissues: Negative Disc levels: No spinal canal or neural foraminal stenosis. MRI LUMBAR SPINE FINDINGS Segmentation:  Standard. Alignment:  Physiologic. Vertebrae: L3-L4 posterior spinal fusion with bilateral transpedicular screws. Conus medullaris: Extends to the L1 level and appears normal. No epidural collection. Paraspinal and other soft tissues: Postsurgical changes in the posterior paraspinal soft tissues with small fluid collection that has decreased in size compared to 11/23/2015. Disc levels: T12-L1: Left subarticular disc extrusion with inferior migration and narrowing of the left lateral recess. No central spinal canal stenosis. No neuroforaminal stenosis. L1-L2: Normal disc space and facet joints. No spinal canal stenosis. No neuroforaminal stenosis. L2-L3: Normal disc space and facet joints. No spinal canal stenosis. No neuroforaminal stenosis. L3-L4: Posterior decompression and postfusion changes. No spinal canal stenosis. No neuroforaminal stenosis. L4-L5: Severe bilateral facet hypertrophy and small disc bulge. No spinal canal stenosis. Mild right neuroforaminal stenosis. L5-S1: Small disc bulge. Mild bilateral facet hypertrophy. No spinal canal stenosis. No neuroforaminal stenosis. IMPRESSION: 1. No cord compression, epidural abscess or hematoma or other finding to explain the reported acute symptoms. 2. Unchanged appearance of T12-L1 disc extrusion narrowing the left lateral recess. 3. Postfusion changes at L3-L4 without residual spinal canal stenosis. Electronically Signed   By: Deatra Robinson M.D.   On: 01/12/2016 20:24   Mr Thoracic Spine Wo Contrast  Result Date: 01/12/2016 CLINICAL DATA:  Recent lumbar surgery. Altered mental status. Bilateral lower extremity weakness and urinary retention. Bilateral upper extremity weakness.  EXAM: MRI CERVICAL, THORACIC AND LUMBAR SPINE WITHOUT CONTRAST TECHNIQUE: Multiplanar and multiecho pulse sequences of the cervical spine, to include the craniocervical junction and cervicothoracic junction, and thoracic and lumbar spine, were obtained without intravenous contrast. COMPARISON:  Lumbar spine MRI 11/23/2015 FINDINGS: MRI CERVICAL SPINE FINDINGS Alignment: Normal Vertebrae: No acute compression fracture, facet edema or focal marrow lesion. Cord: Normal signal and caliber Posterior Fossa, vertebral arteries, paraspinal tissues: Negative Disc levels: At C5-C6, there is a small disc osteophyte complex that narrows the ventral thecal sac and causes mild-to-moderate bilateral foraminal narrowing. Otherwise, there is no cervical spinal canal or foraminal stenosis. MRI THORACIC SPINE FINDINGS Alignment:  Normal Vertebrae: Suspected inferior endplate hemangioma at T7. Normal signal and caliber Paraspinal and other soft tissues: Negative Disc levels: No spinal canal or neural foraminal stenosis. MRI LUMBAR SPINE FINDINGS Segmentation:  Standard. Alignment:  Physiologic. Vertebrae: L3-L4 posterior spinal fusion with bilateral transpedicular screws. Conus medullaris: Extends to the L1 level and appears normal. No epidural collection. Paraspinal and other soft tissues: Postsurgical changes in the posterior paraspinal soft tissues with small fluid collection that has decreased in  size compared to 11/23/2015. Disc levels: T12-L1: Left subarticular disc extrusion with inferior migration and narrowing of the left lateral recess. No central spinal canal stenosis. No neuroforaminal stenosis. L1-L2: Normal disc space and facet joints. No spinal canal stenosis. No neuroforaminal stenosis. L2-L3: Normal disc space and facet joints. No spinal canal stenosis. No neuroforaminal stenosis. L3-L4: Posterior decompression and postfusion changes. No spinal canal stenosis. No neuroforaminal stenosis. L4-L5: Severe bilateral facet  hypertrophy and small disc bulge. No spinal canal stenosis. Mild right neuroforaminal stenosis. L5-S1: Small disc bulge. Mild bilateral facet hypertrophy. No spinal canal stenosis. No neuroforaminal stenosis. IMPRESSION: 1. No cord compression, epidural abscess or hematoma or other finding to explain the reported acute symptoms. 2. Unchanged appearance of T12-L1 disc extrusion narrowing the left lateral recess. 3. Postfusion changes at L3-L4 without residual spinal canal stenosis. Electronically Signed   By: Deatra Robinson M.D.   On: 01/12/2016 20:24   Mr Lumbar Spine Wo Contrast  Result Date: 01/12/2016 CLINICAL DATA:  Recent lumbar surgery. Altered mental status. Bilateral lower extremity weakness and urinary retention. Bilateral upper extremity weakness. EXAM: MRI CERVICAL, THORACIC AND LUMBAR SPINE WITHOUT CONTRAST TECHNIQUE: Multiplanar and multiecho pulse sequences of the cervical spine, to include the craniocervical junction and cervicothoracic junction, and thoracic and lumbar spine, were obtained without intravenous contrast. COMPARISON:  Lumbar spine MRI 11/23/2015 FINDINGS: MRI CERVICAL SPINE FINDINGS Alignment: Normal Vertebrae: No acute compression fracture, facet edema or focal marrow lesion. Cord: Normal signal and caliber Posterior Fossa, vertebral arteries, paraspinal tissues: Negative Disc levels: At C5-C6, there is a small disc osteophyte complex that narrows the ventral thecal sac and causes mild-to-moderate bilateral foraminal narrowing. Otherwise, there is no cervical spinal canal or foraminal stenosis. MRI THORACIC SPINE FINDINGS Alignment:  Normal Vertebrae: Suspected inferior endplate hemangioma at T7. Normal signal and caliber Paraspinal and other soft tissues: Negative Disc levels: No spinal canal or neural foraminal stenosis. MRI LUMBAR SPINE FINDINGS Segmentation:  Standard. Alignment:  Physiologic. Vertebrae: L3-L4 posterior spinal fusion with bilateral transpedicular screws. Conus  medullaris: Extends to the L1 level and appears normal. No epidural collection. Paraspinal and other soft tissues: Postsurgical changes in the posterior paraspinal soft tissues with small fluid collection that has decreased in size compared to 11/23/2015. Disc levels: T12-L1: Left subarticular disc extrusion with inferior migration and narrowing of the left lateral recess. No central spinal canal stenosis. No neuroforaminal stenosis. L1-L2: Normal disc space and facet joints. No spinal canal stenosis. No neuroforaminal stenosis. L2-L3: Normal disc space and facet joints. No spinal canal stenosis. No neuroforaminal stenosis. L3-L4: Posterior decompression and postfusion changes. No spinal canal stenosis. No neuroforaminal stenosis. L4-L5: Severe bilateral facet hypertrophy and small disc bulge. No spinal canal stenosis. Mild right neuroforaminal stenosis. L5-S1: Small disc bulge. Mild bilateral facet hypertrophy. No spinal canal stenosis. No neuroforaminal stenosis. IMPRESSION: 1. No cord compression, epidural abscess or hematoma or other finding to explain the reported acute symptoms. 2. Unchanged appearance of T12-L1 disc extrusion narrowing the left lateral recess. 3. Postfusion changes at L3-L4 without residual spinal canal stenosis. Electronically Signed   By: Deatra Robinson M.D.   On: 01/12/2016 20:24   US Renal  Result Date: 01/12/2016 CLINICAL DATA:  70 year old female with diabetes and chronic kidney disease presenting with urinary retention. EXAM: RENAL / URINARY TRACT ULTRASOUND COMPLETE COMPARISON:  Ultrasound dated 12/06/2015 FINDINGS: Right Kidney: Length: 10.8 cm. Mildly echogenic. No mass or hydronephrosis visualized. Left Kidney: Length: 7.0 cm. Mildly echogenic. No mass or hydronephrosis visualized. Bladder: The urinary  bladder is decompressed around a Foley catheter. IMPRESSION: No hydronephrosis or echogenic stone. Mildly echogenic kidneys. Electronically Signed   By: Elgie CollardArash  Radparvar M.D.    On: 01/12/2016 02:19   Dg Chest Portable 1 View  Result Date: 01/14/2016 CLINICAL DATA:  Weakness. EXAM: PORTABLE CHEST 1 VIEW COMPARISON:  December 17, 2015 FINDINGS: Stable cardiomegaly. Tortuous thoracic aorta, probably unchanged given difference in technique. No pneumothorax. No pulmonary nodules, masses, or focal infiltrates. IMPRESSION: No active disease. Electronically Signed   By: Gerome Samavid  Williams III M.D   On: 01/09/2016 16:39   [place] Abdomen Limited Ruq  Result Date: 01/13/2016 CLINICAL DATA:  Elevated LFTs, history diabetes mellitus, hypertension, stage IV chronic kidney disease EXAM: US ABDOMEN LIMITED - RIGHT UPPER QUADRANT COMPARISON:  CT abdomen and pelvis 12/04/2011 FINDINGS: Gallbladder: Upper normal size. No gallstones identified. No wall thickening or pericholecystic fluid. Unable to assess for sonographic Murphy sign, patient unresponsive. Common bile duct: Diameter: Normal caliber 4 mm diameter. Liver: Degradation of image quality secondary to body habitus. No focal hepatic abnormalities are identified. Hepatopetal portal venous flow. No RIGHT upper quadrant free fluid. IMPRESSION: No sonographic abnormalities identified. Electronically Signed   By: Ulyses SouthwardMark  Boles M.D.   On: 01/13/2016 15:59    Medications:   . ampicillin (OMNIPEN) IV  2 g Intravenous Q24H  . cefTRIAXone (ROCEPHIN)  IV  2 g Intravenous Q12H  . chlorhexidine  15 mL Mouth Rinse BID  . insulin aspart  0-9 Units Subcutaneous TID WC  . insulin glargine  5 Units Subcutaneous Q2200  . mouth rinse  15 mL Mouth Rinse q12n4p  . sodium chloride flush  3 mL Intravenous Q12H   Continuous Infusions: . argatroban 1 mcg/kg/min (01/13/16 1526)  .  sodium bicarbonate  infusion 1000 mL      Medical decision making is of high complexity and this patient is at high risk of deterioration, therefore this is a level 3 visit.    LOS: 2 days   RAMA,CHRISTINA  [facility] Pager [phone]. If unable to reach me by pager,  please call my cell phone at [phone].  *Please refer to [email or web address], password [email or web address] to get updated schedule on who will round on this patient, as hospitalists switch teams weekly. If 7PM-7AM, please contact night-coverage at [email or web address], password [email or web address] for any overnight needs.  01/13/2016, 4:13 PM

## 2016-01-13 NOTE — Progress Notes (Addendum)
Admit: 07-14-15 LOS: 2  28F presenting with progressive AMS, weakness, debility after lumbar spinal surgery 11/2015 followed by AKI, NSTEMI; found to have sig azotemia, hyperkalemia, and likely UTI with ? Fungal component  Subjective:  0.4L UOP yesterday On RA, BP Stable On NaHCO3 gtt at 15350mL/hr No labs yet this AM U Cx with yeast MRI Brain: no acute process MRI Spine Spine: no acute process/infection/cord impingement  12/11 0701 - 12/12 0700 In: 1321.1 [I.V.:1321.1] Out: 400 [Urine:400]  Filed Weights   2015/04/29 2044  Weight: 97.5 kg (215 lb)    Scheduled Meds: . ampicillin (OMNIPEN) IV  2 g Intravenous Q24H  . cefTRIAXone (ROCEPHIN)  IV  2 g Intravenous Q12H  . chlorhexidine  15 mL Mouth Rinse BID  . insulin aspart  0-9 Units Subcutaneous TID WC  . insulin glargine  10 Units Subcutaneous Q2200  . mouth rinse  15 mL Mouth Rinse q12n4p  . sodium chloride flush  3 mL Intravenous Q12H   Continuous Infusions: . argatroban 1 mcg/kg/min (01/13/16 0439)  .  sodium bicarbonate  infusion 1000 mL 150 mL/hr at 01/13/16 0440   PRN Meds:.acetaminophen **OR** acetaminophen, albuterol, bisacodyl, camphor-menthol **AND** hydrOXYzine, docusate sodium, metoprolol, ondansetron **OR** ondansetron (ZOFRAN) IV, sorbitol  Current Labs: reviewed    Physical Exam:  Blood pressure (!) 138/51, pulse 75, temperature 98.7 F (37.1 C), temperature source Oral, resp. rate 16, height 5\' 3"  (1.6 m), weight 97.5 kg (215 lb), SpO2 99 %. GEN: NAD, but sluggish and doesn't respond to questions very much; somnolent ENT: NCAT EYES: EOMI CV: RRR PULM: CTAB ABD: mild TTP in SP region, no RUQ Tenderness. +BS SKIN: no rashes/lesions EXT:No LEE  A 1. AoCKD 1. Prev admission with severe AKI req HD x1, recovered 2. SCr was 1.4-1.5 prior to admission 3. Renal US at admission was neg for acute structural issues, L 7cm? 4. UA with TNTC WBC, Turbid, +Hb, and fungus reported 5. Had 800mL UOP at time of  foley placement 6. Weighs 5.5kg less than recent discharge with endorsed poor PO and liquid intake in days preceding this admission 7. Working theory is hypovolemia / pre-renal with concominant UTI 2. Suspected UTI, perhaps fungal 3. AMS, Weakness 4. Hyperkalemia, mild, EKG w/o changes 5. Metabolic Acidosis, somewhat high AG likley related #1 6. Recent NSG lumbar spinal procedure, neck pain, ? LE weakness, and L urinary retention -- MRI brain + MRI Spine negative 12/11 7. Leukocytosis 8. 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 AFIb, was on NOAC; held 11. Recent NSTEMI 12. Recent TIA  P 1. Await AM labs 2. ABX per TRH 3. Cont Foley  Labs returned with no real improvement in renal function despite IVFs.  No Hyperkalemia; acidosis mildly improved.  Tentative will need HD tomorrow if further failure to respond to volume challenge. NPO p MN to facilitate potential HD Cath.    Sabra Heckyan Sheryl Towell MD 01/13/2016, 9:58 AM   Recent Labs Lab 2015/04/29 1615 01/12/16 0817 01/12/16 1637  NA 132* 132* 134*  K 5.5* 5.6* 5.7*  CL 101 101 103  CO2 17* 16* 16*  GLUCOSE 167* 116* 106*  BUN 149* 158* 158*  CREATININE 6.06* 6.66* 6.73*  CALCIUM 8.4* 8.3* 8.3*    Recent Labs Lab 2015/04/29 1615 01/12/16 0817 01/13/16 0444  WBC 14.9* 13.2* 13.5*  HGB 11.4* 10.6* 10.2*  HCT 35.1* 32.6* 31.5*  MCV 85.4 85.1 86.1  PLT 304 286 259

## 2016-01-13 NOTE — Progress Notes (Addendum)
ANTICOAGULATION CONSULT NOTE - Follow Up Consult  Pharmacy Consult for Argatroban (Apixaban on hold) Indication: Recent NSTEMI/TIA from possible afib, Hx HIT  Allergies  Allergen Reactions  . 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 Ace Inhibitors Cough  . Contrast Media [Iodinated Diagnostic Agents] Other (See Comments)    Reaction unknown  . Latex Other (See Comments)    Unknown reaction per MAR   . Shellfish Allergy Other (See Comments)    Pt cannot recall at this time    Patient Measurements: Height: 5\' 3"  (160 cm) Weight: 215 lb (97.5 kg) IBW/kg (Calculated) : 52.4  Vital Signs: Temp: 98.7 F (37.1 C) (12/12 0634) Temp Source: Oral (12/12 0634) BP: 138/51 (12/12 0634) Pulse Rate: 75 (12/12 0634)  Labs:  Recent Labs  12/13/2015 1615  01/12/16 0817 01/12/16 1637 01/13/16 0444  HGB 11.4*  --  10.6*  --  10.2*  HCT 35.1*  --  32.6*  --  31.5*  PLT 304  --  286  --  259  APTT  --   < > 78* 64* 82*  CREATININE 6.06*  --  6.66* 6.73*  --   < > = values in this interval not displayed.  Estimated Creatinine Clearance: 8.6 mL/min (by C-G formula based on SCr of 6.73 mg/dL (H)).   Assessment: 70yom with recent admission for NSTEMI, TIA, possible PAF> currently in SR, and Hx HIT - discharged on eliquis with planned duration for 3 months. She is now in acute renal failure and uremic so holding eliquis and covering with argatroban. Last eliquis dose 12/10 AM. Currently  therapeutic on argatroban 171mcg/kg/min aPTT 82sec.  -Hgb low stable, PLTC wnl, SCr elevated unchanged (baseline ~1). No s/sx bleeding noted.   Goal of Therapy:  aPTT 50-90 seconds Monitor platelets by anticoagulation protocol: Yes   Plan:  -Cont argatroban 1 mcg/kg/min -12 hour aPTTs at 0500 and 1700 due to renal function  -Monitor for s/sx bleeding, CBC, renal function -F/u transition to PO AC option when appropriate  Heidi Campbell, Pharm.D. PGY1 Pharmacy  Resident 12/12/201710:29 AM Pager [phone]   Addendum -aPTT therapeutic, near high end of range -continue current rate argatroban 1 mcg/kg/min   Heidi Campbell, Heidi Campbell  01/13/2016 5:51 PM

## 2016-01-13 NOTE — Consult Note (Signed)
Referring Provider: Dr. Darnelle Catalan Primary Care Physician:  Astrid Divine, MD Primary Gastroenterologist:  Gentry Fitz  Reason for Consultation:  Elevated LFTs  HPI: Heidi Campbell is a 70 y.o. female with multiple medical problems who is seen for a consult due to rising liver enzymes as stated below. She recently had a NSTEMI and has been treated with Vanco for an enterococcal UTI. She also developed Heparin-induced thrombocytopenia and also had a TIA. Her serum Cr is rising and is up to 6.91 today. AST 89, ALT 55 on 12/19/15. Abd U/S from today was negative. She is somnolent and unable to give me any history. She moans at times and has a congested cough at times.  Results for CONOR, FILSAIME (MRN [id number]) as of 01/13/2016 17:08  Ref. Range 01/05/2016 16:15 01/12/2016 08:17 01/12/2016 16:37 01/13/2016 10:10  Alkaline Phosphatase Latest Ref Range: 38 - 126 U/L 117 109  121  Albumin Latest Ref Range: 3.5 - 5.0 g/dL 2.3 (L) 2.0 (L)  2.0 (L)  AST Latest Ref Range: 15 - 41 U/L 917 (H) 913 (H)  1,133 (H)  ALT Latest Ref Range: 14 - 54 U/L 453 (H) 441 (H)  513 (H)  Total Protein Latest Ref Range: 6.5 - 8.1 g/dL 6.2 (L) 5.7 (L)  5.7 (L)  Ammonia Latest Ref Range: 9 - 35 umol/L 54 (H)     Bilirubin, Direct Latest Ref Range: 0.1 - 0.5 mg/dL  0.2    Indirect Bilirubin Latest Ref Range: 0.3 - 0.9 mg/dL  0.6    Total Bilirubin Latest Ref Range: 0.3 - 1.2 mg/dL 0.5 0.8  0.5    Past Medical History:  Diagnosis Date  . ALLERGIC RHINITIS   . Arthritis   . ASTHMA   . Asthma   . CKD (chronic kidney disease), stage IV (HCC)   . Diabetes mellitus without complication (HCC)   . Esophageal reflux   . Hypertension   . NSTEMI (non-ST elevated myocardial infarction) (HCC)   . SINUSITIS, ACUTE     Past Surgical History:  Procedure Laterality Date  . ABDOMINAL HYSTERECTOMY    . arthroscopic knee Right 2015  . BACK SURGERY    . BREAST REDUCTION SURGERY Bilateral 2006  . BREAST SURGERY    . HAND  SURGERY Left    damaged nerve in hand  . IR GENERIC HISTORICAL  12/19/2015   IR FLUORO GUIDE CV LINE RIGHT 12/19/2015 Oley Balm, MD [facility]  . IR GENERIC HISTORICAL  12/19/2015   IR US GUIDE VASC ACCESS RIGHT [facility]    Prior to Admission medications   Medication Sig Start Date End Date Taking? Authorizing Provider  acetaminophen (TYLENOL) 325 MG tablet Take 650 mg by mouth 3 (three) times daily. And every 4 hours as needed for pain/fever   Yes Historical Provider, MD  Amino Acids-Protein Hydrolys (FEEDING SUPPLEMENT, PRO-STAT SUGAR FREE 64,) LIQD Take 30 mLs by mouth 2 (two) times daily.   Yes Historical Provider, MD  amLODipine (NORVASC) 10 MG tablet Take 1 tablet (10 mg total) by mouth daily. 07/26/14  Yes Donnetta Hutching, MD  apixaban (ELIQUIS) 5 MG TABS tablet Take 1 tablet (5 mg total) by mouth 2 (two) times daily. 12/28/15  Yes Mauricio Annett Gula, MD  carvedilol (COREG) 3.125 MG tablet Take 1 tablet (3.125 mg total) by mouth 2 (two) times daily with a meal. 12/28/15  Yes Mauricio Annett Gula, MD  doxazosin (CARDURA) 4 MG tablet Take 1 tablet (4 mg total) by mouth at bedtime.  12/28/15  Yes Mauricio Annett Gula, MD  ezetimibe (ZETIA) 10 MG tablet Take 1 tablet (10 mg total) by mouth daily. 07/20/13  Yes Reather Littler, MD  ferrous sulfate 325 (65 FE) MG tablet Take 1 tablet (325 mg total) by mouth 3 (three) times daily with meals. 12/28/15  Yes Mauricio Annett Gula, MD  Fluticasone-Salmeterol (ADVAIR) 100-50 MCG/DOSE AEPB Inhale 1 puff into the lungs 2 (two) times daily.    Yes Historical Provider, MD  gabapentin (NEURONTIN) 400 MG capsule Take 400 mg by mouth 3 (three) times daily.   Yes Historical Provider, MD  hydrALAZINE (APRESOLINE) 50 MG tablet Take 1 tablet by mouth 3 times a day. Patient taking differently: Take 50 mg by mouth 3 (three) times daily.  10/17/14  Yes Corky Crafts, MD  Insulin Glargine (LANTUS) 100 UNIT/ML Solostar Pen Inject 10 Units into the  skin daily at 10 pm. Patient taking differently: Inject 14 Units into the skin daily at 10 pm.  12/12/15  Yes Alison Murray, MD  omeprazole (PRILOSEC) 20 MG capsule Take 20 mg by mouth daily at 6 (six) AM.    Yes Historical Provider, MD  rosuvastatin (CRESTOR) 40 MG tablet Take 1 tablet (40 mg total) by mouth daily. Patient taking differently: Take 40 mg by mouth at bedtime.  12/22/12  Yes Corky Crafts, MD  albuterol (PROVENTIL HFA;VENTOLIN HFA) 108 (90 BASE) MCG/ACT inhaler Inhale 2 puffs into the lungs every 6 (six) hours as needed for wheezing or shortness of breath.    Historical Provider, MD  bisacodyl (DULCOLAX) 10 MG suppository Place 10 mg rectally every 4 (four) hours as needed for moderate constipation.     Historical Provider, MD  fluticasone (FLONASE) 50 MCG/ACT nasal spray Place 1 spray into both nostrils daily as needed for allergies.     Historical Provider, MD  nitroGLYCERIN (NITROSTAT) 0.4 MG SL tablet Place 0.4 mg under the tongue every 5 (five) minutes as needed for chest pain.    Historical Provider, MD  ondansetron (ZOFRAN ODT) 4 MG disintegrating tablet Take 1 tablet (4 mg total) by mouth every 8 (eight) hours as needed for nausea or vomiting. 01/27/15   Shawn C Joy, PA-C  polyethylene glycol (MIRALAX / GLYCOLAX) packet Take 17 g by mouth daily as needed (For constipation.).     Historical Provider, MD    Scheduled Meds: . ampicillin (OMNIPEN) IV  2 g Intravenous Q24H  . cefTRIAXone (ROCEPHIN)  IV  2 g Intravenous Q12H  . chlorhexidine  15 mL Mouth Rinse BID  . insulin aspart  0-9 Units Subcutaneous TID WC  . insulin glargine  5 Units Subcutaneous Q2200  . mouth rinse  15 mL Mouth Rinse q12n4p  . sodium chloride flush  3 mL Intravenous Q12H   Continuous Infusions: . argatroban 1 mcg/kg/min (01/13/16 1526)  .  sodium bicarbonate  infusion 1000 mL     PRN Meds:.albuterol, bisacodyl, camphor-menthol **AND** hydrOXYzine, docusate sodium, metoprolol, ondansetron  **OR** ondansetron (ZOFRAN) IV, sorbitol  Allergies as of 01/22/2016 - Review Complete 01/26/2016  Allergen Reaction Noted  . Heparin Other (See Comments) 12/19/2015  . Ace inhibitors Cough 10/12/2013  . Ampicillin Other (See Comments) 12/17/2015  . Cefazolin Other (See Comments) 12/17/2015  . Cefepime Other (See Comments) 12/17/2015  . Ceftazidime Other (See Comments) 12/17/2015  . Ceftriaxone Other (See Comments) 12/17/2015  . Ciprofloxacin Other (See Comments) 12/17/2015  . Contrast media [iodinated diagnostic agents] Other (See Comments) 12/05/2015  . Ertapenem Other (See Comments) 12/17/2015  .  Gentamicin Other (See Comments) 12/17/2015  . Imipenem Other (See Comments) 12/17/2015  . Latex Other (See Comments) 12/05/2015  . Levofloxacin Other (See Comments) 12/17/2015  . Nitrofuran derivatives Other (See Comments) 12/17/2015  . Shellfish allergy Other (See Comments) 12/04/2011  . Tobramycin Other (See Comments) 12/17/2015  . Zosyn [piperacillin sod-tazobactam so] Other (See Comments) 12/17/2015    Family History  Problem Relation Age of Onset  . Diabetes Mother   . Heart attack Father   . Heart disease Father     Social History   Social History  . Marital status: Widowed    Spouse name: N/A  . Number of children: N/A  . Years of education: N/A   Occupational History  . retired    Social History Main Topics  . Smoking status: Never Smoker  . Smokeless tobacco: Never Used  . Alcohol use No  . Drug use: No  . Sexual activity: Not on file   Other Topics Concern  . Not on file   Social History Narrative  . No narrative on file    Review of Systems: All negative except as stated above in HPI.  Physical Exam: Vital signs: Vitals:   01/13/16 1100 01/13/16 1656  BP: (!) 128/51 (!) 116/46  Pulse: 77 75  Resp: 18 18  Temp: 98.5 F (36.9 C) 100.3 F (37.9 C)   Last BM Date: 01/12/16 General:   Somnolent, elderly, obese, no acute distress Neck: supple,  nontender Lungs:  Coarse breath sounds Heart:  Regular rate and rhythm; no murmurs, clicks, rubs,  or gallops. Abdomen: periumbilical tenderness (facial grimace) with guarding, soft, nondistended, +BS  Rectal:  Deferred Ext: no LE edema (legs in soft boots) Skin: no rash  GI:  Lab Results:  Recent Labs  01/25/2016 1615 01/12/16 0817 01/13/16 0444  WBC 14.9* 13.2* 13.5*  HGB 11.4* 10.6* 10.2*  HCT 35.1* 32.6* 31.5*  PLT 304 286 259   BMET  Recent Labs  01/12/16 0817 01/12/16 1637 01/13/16 1010  NA 132* 134* 136  K 5.6* 5.7* 5.0  CL 101 103 100*  CO2 16* 16* 18*  GLUCOSE 116* 106* 128*  BUN 158* 158* 157*  CREATININE 6.66* 6.73* 6.91*  CALCIUM 8.3* 8.3* 8.0*   LFT  Recent Labs  01/12/16 0817 01/13/16 1010  PROT 5.7* 5.7*  ALBUMIN 2.0* 2.0*  AST 913* 1,133*  ALT 441* 513*  ALKPHOS 109 121  BILITOT 0.8 0.5  BILIDIR 0.2  --   IBILI 0.6  --    PT/INR No results for input(s): LABPROT, INR in the last 72 hours.   Studies/Results: Ct Head Wo Contrast  Result Date: 01/05/2016 CLINICAL DATA:  Lethargy. EXAM: CT HEAD WITHOUT CONTRAST TECHNIQUE: Contiguous axial images were obtained from the base of the skull through the vertex without intravenous contrast. COMPARISON:  12/24/2015 head CT. FINDINGS: Brain: No evidence of parenchymal hemorrhage or extra-axial fluid collection. No mass lesion, mass effect, or midline shift. No CT evidence of acute infarction. Intracranial atherosclerosis. Cerebral volume is age appropriate. No ventriculomegaly. Vascular: No hyperdense vessel or unexpected calcification. Skull: No evidence of calvarial fracture. Sinuses/Orbits: The visualized paranasal sinuses are essentially clear. Other:  The mastoid air cells are unopacified. IMPRESSION: Negative head CT.  No evidence of acute intracranial abnormality. Electronically Signed   By: Delbert PhenixJason A Poff M.D.   On: 01/04/2016 17:20   Mr Brain Wo Contrast  Result Date: 01/12/2016 CLINICAL DATA:   Altered mental status, extremity weakness and urinary retention after recent lumbar  spine surgery. History of TIA, hypertension and diabetes. EXAM: MRI HEAD WITHOUT CONTRAST TECHNIQUE: Multiplanar, multiecho pulse sequences of the brain and surrounding structures were obtained without intravenous contrast. COMPARISON:  CT HEAD Jan 23, 2016 and MRI of the head December 24, 2015 FINDINGS: BRAIN: No reduced diffusion to suggest acute ischemia. No susceptibility artifact to suggest hemorrhage (prior extra-axial susceptibility weighted artifact attributed to 3 tesla technique). The ventricles and sulci are normal for patient's age. No suspicious parenchymal signal, masses or mass effect. Minimal supratentorial and pontine white matter FLAIR T2 hyperintensities compatible with chronic small vessel ischemic disease, less than expected for age. No abnormal extra-axial fluid collections. No extra-axial masses though, contrast enhanced sequences would be more sensitive. VASCULAR: Normal major intracranial vascular flow voids present at skull base. SKULL AND UPPER CERVICAL SPINE: Fluid-filled expanded partially empty sella. No suspicious calvarial bone marrow signal. Craniocervical junction maintained. Trace LEFT temporomandibular joint effusion. SINUSES/ORBITS: The mastoid air-cells and included paranasal sinuses are well-aerated. Subcentimeter LEFT nasal mucosal retention cyst. The included ocular globes and orbital contents are non-suspicious. OTHER: None. IMPRESSION: No acute intracranial process. Partially empty sella, otherwise negative MRI of the head for age. Electronically Signed   By: Awilda Metro M.D.   On: 01/12/2016 19:19   Mr Cervical Spine Wo Contrast  Result Date: 01/12/2016 CLINICAL DATA:  Recent lumbar surgery. Altered mental status. Bilateral lower extremity weakness and urinary retention. Bilateral upper extremity weakness. EXAM: MRI CERVICAL, THORACIC AND LUMBAR SPINE WITHOUT CONTRAST  TECHNIQUE: Multiplanar and multiecho pulse sequences of the cervical spine, to include the craniocervical junction and cervicothoracic junction, and thoracic and lumbar spine, were obtained without intravenous contrast. COMPARISON:  Lumbar spine MRI 11/23/2015 FINDINGS: MRI CERVICAL SPINE FINDINGS Alignment: Normal Vertebrae: No acute compression fracture, facet edema or focal marrow lesion. Cord: Normal signal and caliber Posterior Fossa, vertebral arteries, paraspinal tissues: Negative Disc levels: At C5-C6, there is a small disc osteophyte complex that narrows the ventral thecal sac and causes mild-to-moderate bilateral foraminal narrowing. Otherwise, there is no cervical spinal canal or foraminal stenosis. MRI THORACIC SPINE FINDINGS Alignment:  Normal Vertebrae: Suspected inferior endplate hemangioma at T7. Normal signal and caliber Paraspinal and other soft tissues: Negative Disc levels: No spinal canal or neural foraminal stenosis. MRI LUMBAR SPINE FINDINGS Segmentation:  Standard. Alignment:  Physiologic. Vertebrae: L3-L4 posterior spinal fusion with bilateral transpedicular screws. Conus medullaris: Extends to the L1 level and appears normal. No epidural collection. Paraspinal and other soft tissues: Postsurgical changes in the posterior paraspinal soft tissues with small fluid collection that has decreased in size compared to 11/23/2015. Disc levels: T12-L1: Left subarticular disc extrusion with inferior migration and narrowing of the left lateral recess. No central spinal canal stenosis. No neuroforaminal stenosis. L1-L2: Normal disc space and facet joints. No spinal canal stenosis. No neuroforaminal stenosis. L2-L3: Normal disc space and facet joints. No spinal canal stenosis. No neuroforaminal stenosis. L3-L4: Posterior decompression and postfusion changes. No spinal canal stenosis. No neuroforaminal stenosis. L4-L5: Severe bilateral facet hypertrophy and small disc bulge. No spinal canal stenosis. Mild  right neuroforaminal stenosis. L5-S1: Small disc bulge. Mild bilateral facet hypertrophy. No spinal canal stenosis. No neuroforaminal stenosis. IMPRESSION: 1. No cord compression, epidural abscess or hematoma or other finding to explain the reported acute symptoms. 2. Unchanged appearance of T12-L1 disc extrusion narrowing the left lateral recess. 3. Postfusion changes at L3-L4 without residual spinal canal stenosis. Electronically Signed   By: Deatra Robinson M.D.   On: 01/12/2016 20:24   Mr Thoracic  Spine Wo Contrast  Result Date: 01/12/2016 CLINICAL DATA:  Recent lumbar surgery. Altered mental status. Bilateral lower extremity weakness and urinary retention. Bilateral upper extremity weakness. EXAM: MRI CERVICAL, THORACIC AND LUMBAR SPINE WITHOUT CONTRAST TECHNIQUE: Multiplanar and multiecho pulse sequences of the cervical spine, to include the craniocervical junction and cervicothoracic junction, and thoracic and lumbar spine, were obtained without intravenous contrast. COMPARISON:  Lumbar spine MRI 11/23/2015 FINDINGS: MRI CERVICAL SPINE FINDINGS Alignment: Normal Vertebrae: No acute compression fracture, facet edema or focal marrow lesion. Cord: Normal signal and caliber Posterior Fossa, vertebral arteries, paraspinal tissues: Negative Disc levels: At C5-C6, there is a small disc osteophyte complex that narrows the ventral thecal sac and causes mild-to-moderate bilateral foraminal narrowing. Otherwise, there is no cervical spinal canal or foraminal stenosis. MRI THORACIC SPINE FINDINGS Alignment:  Normal Vertebrae: Suspected inferior endplate hemangioma at T7. Normal signal and caliber Paraspinal and other soft tissues: Negative Disc levels: No spinal canal or neural foraminal stenosis. MRI LUMBAR SPINE FINDINGS Segmentation:  Standard. Alignment:  Physiologic. Vertebrae: L3-L4 posterior spinal fusion with bilateral transpedicular screws. Conus medullaris: Extends to the L1 level and appears normal. No  epidural collection. Paraspinal and other soft tissues: Postsurgical changes in the posterior paraspinal soft tissues with small fluid collection that has decreased in size compared to 11/23/2015. Disc levels: T12-L1: Left subarticular disc extrusion with inferior migration and narrowing of the left lateral recess. No central spinal canal stenosis. No neuroforaminal stenosis. L1-L2: Normal disc space and facet joints. No spinal canal stenosis. No neuroforaminal stenosis. L2-L3: Normal disc space and facet joints. No spinal canal stenosis. No neuroforaminal stenosis. L3-L4: Posterior decompression and postfusion changes. No spinal canal stenosis. No neuroforaminal stenosis. L4-L5: Severe bilateral facet hypertrophy and small disc bulge. No spinal canal stenosis. Mild right neuroforaminal stenosis. L5-S1: Small disc bulge. Mild bilateral facet hypertrophy. No spinal canal stenosis. No neuroforaminal stenosis. IMPRESSION: 1. No cord compression, epidural abscess or hematoma or other finding to explain the reported acute symptoms. 2. Unchanged appearance of T12-L1 disc extrusion narrowing the left lateral recess. 3. Postfusion changes at L3-L4 without residual spinal canal stenosis. Electronically Signed   By: Deatra RobinsonKevin  Herman M.D.   On: 01/12/2016 20:24   Mr Lumbar Spine Wo Contrast  Result Date: 01/12/2016 CLINICAL DATA:  Recent lumbar surgery. Altered mental status. Bilateral lower extremity weakness and urinary retention. Bilateral upper extremity weakness. EXAM: MRI CERVICAL, THORACIC AND LUMBAR SPINE WITHOUT CONTRAST TECHNIQUE: Multiplanar and multiecho pulse sequences of the cervical spine, to include the craniocervical junction and cervicothoracic junction, and thoracic and lumbar spine, were obtained without intravenous contrast. COMPARISON:  Lumbar spine MRI 11/23/2015 FINDINGS: MRI CERVICAL SPINE FINDINGS Alignment: Normal Vertebrae: No acute compression fracture, facet edema or focal marrow lesion. Cord:  Normal signal and caliber Posterior Fossa, vertebral arteries, paraspinal tissues: Negative Disc levels: At C5-C6, there is a small disc osteophyte complex that narrows the ventral thecal sac and causes mild-to-moderate bilateral foraminal narrowing. Otherwise, there is no cervical spinal canal or foraminal stenosis. MRI THORACIC SPINE FINDINGS Alignment:  Normal Vertebrae: Suspected inferior endplate hemangioma at T7. Normal signal and caliber Paraspinal and other soft tissues: Negative Disc levels: No spinal canal or neural foraminal stenosis. MRI LUMBAR SPINE FINDINGS Segmentation:  Standard. Alignment:  Physiologic. Vertebrae: L3-L4 posterior spinal fusion with bilateral transpedicular screws. Conus medullaris: Extends to the L1 level and appears normal. No epidural collection. Paraspinal and other soft tissues: Postsurgical changes in the posterior paraspinal soft tissues with small fluid collection that has decreased in  size compared to 11/23/2015. Disc levels: T12-L1: Left subarticular disc extrusion with inferior migration and narrowing of the left lateral recess. No central spinal canal stenosis. No neuroforaminal stenosis. L1-L2: Normal disc space and facet joints. No spinal canal stenosis. No neuroforaminal stenosis. L2-L3: Normal disc space and facet joints. No spinal canal stenosis. No neuroforaminal stenosis. L3-L4: Posterior decompression and postfusion changes. No spinal canal stenosis. No neuroforaminal stenosis. L4-L5: Severe bilateral facet hypertrophy and small disc bulge. No spinal canal stenosis. Mild right neuroforaminal stenosis. L5-S1: Small disc bulge. Mild bilateral facet hypertrophy. No spinal canal stenosis. No neuroforaminal stenosis. IMPRESSION: 1. No cord compression, epidural abscess or hematoma or other finding to explain the reported acute symptoms. 2. Unchanged appearance of T12-L1 disc extrusion narrowing the left lateral recess. 3. Postfusion changes at L3-L4 without residual  spinal canal stenosis. Electronically Signed   By: Deatra Robinson M.D.   On: 01/12/2016 20:24   US Renal  Result Date: 01/12/2016 CLINICAL DATA:  70 year old female with diabetes and chronic kidney disease presenting with urinary retention. EXAM: RENAL / URINARY TRACT ULTRASOUND COMPLETE COMPARISON:  Ultrasound dated 12/06/2015 FINDINGS: Right Kidney: Length: 10.8 cm. Mildly echogenic. No mass or hydronephrosis visualized. Left Kidney: Length: 7.0 cm. Mildly echogenic. No mass or hydronephrosis visualized. Bladder: The urinary bladder is decompressed around a Foley catheter. IMPRESSION: No hydronephrosis or echogenic stone. Mildly echogenic kidneys. Electronically Signed   By: Elgie Collard M.D.   On: 01/12/2016 02:19   US Abdomen Limited Ruq  Result Date: 01/13/2016 CLINICAL DATA:  Elevated LFTs, history diabetes mellitus, hypertension, stage IV chronic kidney disease EXAM: US ABDOMEN LIMITED - RIGHT UPPER QUADRANT COMPARISON:  CT abdomen and pelvis 12/04/2011 FINDINGS: Gallbladder: Upper normal size. No gallstones identified. No wall thickening or pericholecystic fluid. Unable to assess for sonographic Murphy sign, patient unresponsive. Common bile duct: Diameter: Normal caliber 4 mm diameter. Liver: Degradation of image quality secondary to body habitus. No focal hepatic abnormalities are identified. Hepatopetal portal venous flow. No RIGHT upper quadrant free fluid. IMPRESSION: No sonographic abnormalities identified. Electronically Signed   By: Ulyses Southward M.D.   On: 01/13/2016 15:59    Impression/Plan: 70 yo with multiple organ dysfunction who has been having rising transaminases concerning for meds vs passive hepatic congestion. D/W Dr. Darnelle Catalan who states that echo in November with normal EF. May need reevaluation by cards to look for possible right heart failure contributing to her rising liver enzymes. Noncontrast CT of the abdomen to look for any intrahepatic sources of the rising liver  enyzmes. LFTs pattern NOT consistent with an obstructive biliary process. Continue supportive care and avoid hepatoxic meds. Will f/u.    LOS: 2 days   Noel Rodier C.  01/13/2016, 5:05 PM  Pager [phone]  If no answer or after 5 PM call [phone]

## 2016-01-14 ENCOUNTER — Encounter (HOSPITAL_COMMUNITY): Payer: Self-pay | Admitting: Internal Medicine

## 2016-01-14 ENCOUNTER — Inpatient Hospital Stay (HOSPITAL_COMMUNITY): Payer: Medicare Other

## 2016-01-14 DIAGNOSIS — K760 Fatty (change of) liver, not elsewhere classified: Secondary | ICD-10-CM

## 2016-01-14 DIAGNOSIS — N184 Chronic kidney disease, stage 4 (severe): Secondary | ICD-10-CM

## 2016-01-14 DIAGNOSIS — R6521 Severe sepsis with septic shock: Secondary | ICD-10-CM

## 2016-01-14 DIAGNOSIS — J9601 Acute respiratory failure with hypoxia: Secondary | ICD-10-CM

## 2016-01-14 DIAGNOSIS — K7290 Hepatic failure, unspecified without coma: Secondary | ICD-10-CM

## 2016-01-14 DIAGNOSIS — N186 End stage renal disease: Secondary | ICD-10-CM

## 2016-01-14 DIAGNOSIS — N170 Acute kidney failure with tubular necrosis: Principal | ICD-10-CM

## 2016-01-14 DIAGNOSIS — B49 Unspecified mycosis: Secondary | ICD-10-CM

## 2016-01-14 DIAGNOSIS — K7201 Acute and subacute hepatic failure with coma: Secondary | ICD-10-CM

## 2016-01-14 DIAGNOSIS — J9600 Acute respiratory failure, unspecified whether with hypoxia or hypercapnia: Secondary | ICD-10-CM

## 2016-01-14 DIAGNOSIS — D689 Coagulation defect, unspecified: Secondary | ICD-10-CM | POA: Diagnosis present

## 2016-01-14 DIAGNOSIS — R531 Weakness: Secondary | ICD-10-CM

## 2016-01-14 DIAGNOSIS — A419 Sepsis, unspecified organism: Secondary | ICD-10-CM

## 2016-01-14 HISTORY — DX: Fatty (change of) liver, not elsewhere classified: K76.0

## 2016-01-14 LAB — COMPREHENSIVE METABOLIC PANEL
ALT: 507 U/L — ABNORMAL HIGH (ref 14–54)
AST: 1161 U/L — ABNORMAL HIGH (ref 15–41)
Albumin: 1.9 g/dL — ABNORMAL LOW (ref 3.5–5.0)
Alkaline Phosphatase: 114 U/L (ref 38–126)
Anion gap: 16 — ABNORMAL HIGH (ref 5–15)
BILIRUBIN TOTAL: 0.6 mg/dL (ref 0.3–1.2)
BUN: 159 mg/dL — AB (ref 6–20)
CHLORIDE: 100 mmol/L — AB (ref 101–111)
CO2: 22 mmol/L (ref 22–32)
Calcium: 7.7 mg/dL — ABNORMAL LOW (ref 8.9–10.3)
Creatinine, Ser: 7.34 mg/dL — ABNORMAL HIGH (ref 0.44–1.00)
GFR, EST AFRICAN AMERICAN: 6 mL/min — AB (ref >60)
GFR, EST NON AFRICAN AMERICAN: 5 mL/min — AB (ref >60)
Glucose, Bld: 160 mg/dL — ABNORMAL HIGH (ref 65–99)
POTASSIUM: 5 mmol/L (ref 3.5–5.1)
Sodium: 138 mmol/L (ref 135–145)
TOTAL PROTEIN: 5.7 g/dL — AB (ref 6.5–8.1)

## 2016-01-14 LAB — BLOOD GAS, ARTERIAL
Acid-base deficit: 3.4 mmol/L — ABNORMAL HIGH (ref 0.0–2.0)
Bicarbonate: 20.4 mmol/L (ref 20.0–28.0)
Drawn by: 312761
O2 Content: 4 L/min
O2 Saturation: 96.8 %
Patient temperature: 98.6
pCO2 arterial: 33.1 mmHg (ref 32.0–48.0)
pH, Arterial: 7.407 (ref 7.350–7.450)
pO2, Arterial: 102 mmHg (ref 83.0–108.0)

## 2016-01-14 LAB — LACTATE DEHYDROGENASE: LDH: 2323 U/L — AB (ref 98–192)

## 2016-01-14 LAB — PROTIME-INR
INR: 7.97 — AB
Prothrombin Time: 69.5 seconds — ABNORMAL HIGH (ref 11.4–15.2)

## 2016-01-14 LAB — CBC
HCT: 33.4 % — ABNORMAL LOW (ref 36.0–46.0)
Hemoglobin: 10.8 g/dL — ABNORMAL LOW (ref 12.0–15.0)
MCH: 27.8 pg (ref 26.0–34.0)
MCHC: 32.3 g/dL (ref 30.0–36.0)
MCV: 85.9 fL (ref 78.0–100.0)
PLATELETS: 255 10*3/uL (ref 150–400)
RBC: 3.89 MIL/uL (ref 3.87–5.11)
RDW: 15.5 % (ref 11.5–15.5)
WBC: 17.7 10*3/uL — ABNORMAL HIGH (ref 4.0–10.5)

## 2016-01-14 LAB — GLUCOSE, CAPILLARY
GLUCOSE-CAPILLARY: 143 mg/dL — AB (ref 65–99)
Glucose-Capillary: 143 mg/dL — ABNORMAL HIGH (ref 65–99)
Glucose-Capillary: 160 mg/dL — ABNORMAL HIGH (ref 65–99)

## 2016-01-14 LAB — OCCULT BLOOD X 1 CARD TO LAB, STOOL: Fecal Occult Bld: POSITIVE — AB

## 2016-01-14 LAB — APTT: APTT: 74 s — AB (ref 24–36)

## 2016-01-14 MED ORDER — DEXTROSE 5 % IV SOLN
1.0000 g | Freq: Two times a day (BID) | INTRAVENOUS | Status: DC
  Filled 2016-01-14 (×2): qty 10

## 2016-01-14 MED ORDER — FUROSEMIDE 10 MG/ML IJ SOLN
40.0000 mg | Freq: Once | INTRAMUSCULAR | Status: AC
  Administered 2016-01-14: 40 mg via INTRAVENOUS

## 2016-01-14 MED ORDER — BISACODYL 10 MG RE SUPP: 10.0000 mg | Freq: Every day | RECTAL | Status: DC | PRN

## 2016-01-14 MED ORDER — FUROSEMIDE 10 MG/ML IJ SOLN
INTRAMUSCULAR | Status: AC
  Filled 2016-01-14: qty 4

## 2016-01-14 MED ORDER — MORPHINE SULFATE (PF) 2 MG/ML IV SOLN
1.0000 mg | INTRAVENOUS | Status: DC | PRN
  Administered 2016-01-15: 2 mg via INTRAVENOUS
  Filled 2016-01-14: qty 1

## 2016-01-14 MED ORDER — SODIUM CHLORIDE 0.9 % IV SOLN: 100.0000 mL | INTRAVENOUS | Status: DC | PRN

## 2016-01-14 MED ORDER — SODIUM CHLORIDE 0.9 % IV SOLN
Freq: Once | INTRAVENOUS | Status: AC
  Administered 2016-01-14: 11:00:00 via INTRAVENOUS

## 2016-01-14 MED ORDER — SODIUM CHLORIDE 0.9 % IV SOLN: Freq: Once | INTRAVENOUS | Status: DC

## 2016-01-14 MED ORDER — WHITE PETROLATUM GEL
Status: AC
  Administered 2016-01-15
  Filled 2016-01-14: qty 1

## 2016-01-14 MED ORDER — ALTEPLASE 2 MG IJ SOLR: 2.0000 mg | Freq: Once | INTRAMUSCULAR | Status: DC | PRN

## 2016-01-14 NOTE — Progress Notes (Signed)
Late Entry:  RN received order for oxygen. RN placed patient on 2L, patient saturation 92-93%. RN called rapid response to bedside. Patient placed on 4L of oxygen, ABG's ordered, and STAT chest x-ray taken, continuous pulse ox applied. RN will inform Public relations account executiveoncoming RN.  Veatrice KellsMahmoud,Saban Heinlen I, RN

## 2016-01-14 NOTE — Progress Notes (Signed)
Called to bedside along with Rapid Response. Pt on NRB with Spo2 of 85-86%. BBS crackles. RN stated she was giving lasix and was paging again to obtain orders about oxygen status. MD stated she was waiting for a return call from Critical Care. RT waiting for further orders.

## 2016-01-14 NOTE — Consult Note (Signed)
WOC arrived to the floor to assess spinal wound.  Patient had surgical procedure L3-4 lumbar fusion in October, wound has not healed.  In with AMS and now with AKI.  Spoke with bedside nurse and rapid response nurse at bedside.  Patient is minimally responsive and has orders to transfer to a stepdown unit.   I discussed wound with bedside nurse and she states that hospitalist Dr. Darnelle Catalanama and neurosurgeon in to see patient.  Rama gave orders for moist dressing cover with foam.  Will add this as verbal order.  I will not see patient for this reason. She is unstable at this time.  Discussed POC and bedside nurse.  Re consult if needed, will not follow at this time. Thanks  Heidi Campbell M.[employer] MSN, RN,CWOCN, CNS [phone])

## 2016-01-14 NOTE — Progress Notes (Addendum)
Progress Note    Heidi Campbell  GMW:[id number] DOB: 04/23/1945  DOA: 01/25/2016 PCP: Astrid DivineGRIFFIN,ELAINE COLLINS, MD    Brief Narrative:   Chief complaint: Follow-up mental status changes.  Heidi Campbell is an 70 y.o. female with a PMH of uncontrolled type 2 diabetes with complications, hypertension, stage IV CKD, NSTEMI, multiple recent hospitalizations with placement at a SNF who returned to the hospital 01/30/2016 for evaluation of mental status changes.  Extensive review of her chart done as the patient is very complicated. She was initially admitted from 11/07/15-11/10/15 for neurosurgical decompression for degenerative spondylolisthesis with stenosis. She was rehospitalized 12/05/15-12/12/15 with acute renal failure requiring HD. She was noted to have a surgical wound infection at that time. MRI done at that time was without evidence of discitis or osteomyelitis and neurosurgery did not feel that there was any evidence of deep infection. She was treated with vancomycin for enterococcal UTI. Her discharge creatinine was 2.88 (6.09 on admission). She then was rehospitalized 12/17/15-12/28/15 for evaluation of chest pain. She was found to have an NSTEMI with a troponin of 0.51. Creatinine was 2.63 on readmission. She was noted to be thrombocytopenic with platelet count of 51. 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 During her hospitalization, she also had a TIA with a negative MRI/MRA. 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 Discharge creatinine was 1.51. She was brought back to the hospital 01/23/2016 with mental status changes. Creatinine on admission was 6.06. She was also found to have marked elevation of her LFTs.  Assessment/Plan:   Principal Problem:   Acute renal failure with acute tubular necrosis superimposed on stage 4  chronic kidney disease (HCC) Being followed by nephrology with thought that this may be from a fungal UTI (only 50,000 colonies which is not entirely consistent with a fungal UTI) versus a prerenal etiology. The patient has a Foley catheter in place. We'll continue hydration and monitor for recovery of renal function. Creatinine continues to trend up. Suspect she will need HD again.    Rule out sepsis rule out bacterial meningitis versus UTI Given recent lumbar surgery, there is concern for sepsis. MRI of the brain and spine have not revealed a source of abscess or any evidence of infection however. Patient is being covered for meningitis with ampicillin, vancomycin and Rocephin. Follow-up urine culture. Follow-up blood cultures. Will get a wound care consultation for evaluation of her post surgical wound. Discussed case with Dr. Ninetta LightsHatcher who will see her in consultation. Blood pressure stable.    Acute liver failure/ Elevated LFTs/coagulopathy/hepatic steatosis Right upper quadrant ultrasound done today for further evaluation, appeared normal. LFT elevation is in a hepatocellular pattern. Discussed case with Dr. Bosie ClosSchooler who thinks this may be passive congestion of the liver. Unable to perform contrasted CT of the abdomen at this time due to her renal failure. PT is markedly elevated at 69.5. Albumin is markedly low. LDH is 2323. Gastroenterology following. Pro time markedly elevated consistent with acute liver failure. Extensive workup undertaken. We'll give 2 units of FFP in anticipation of dialysis catheter placement. Noncontrasted CT of the abdomen showed only hepatic steatosis.    Acute hypoxic respiratory failure Moved patient to the stepdown unit secondary to deterioration of condition. Now has an increasing oxygen requirement. Blood gas this morning looked okay. Chest x-ray showed atelectasis versus early pneumonia of  the right middle lobe. Needs dialysis to remove volume. Have discussed the case  with Dr. Molli Knock as she may end up needing to be placed on a ventilator versus high flow oxygen.  Active Problems:   Type 2 diabetes mellitus treated with insulin (HCC) Currently being managed with insulin sensitive SSI 3 times a day and 5 units of Lantus daily. CBGs 111-143.      Essential hypertension Continue metoprolol as needed for elevated blood pressure.    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 Hold argatroban in anticipation of the need for dialysis catheter placement.    Acute encephalopathy Unclear if this is sepsis, hepatic encephalopathy or uremia or combination of all 3. Mental status continues to be altered.    Current use of long term anticoagulation Remains on argatroban given recent NSTEMI/TIA and concern for afib in the setting of HIT. Has maintained normal sinus rhythm on telemetry. Will need to remain on blood thinners for at least 3 months.    Hyperkalemia Resolved. Continue sodium bicarbonate infusion. No EKG changes. Unable to take oral medications at this time.    Chronic diastolic CHF (congestive heart failure) (HCC) Monitor volume status closely.    Pulmonary hypertension    Family Communication/Anticipated D/C date and plan/Code Status   DVT prophylaxis: Argatroban stopped today in order to have dialysis catheter placed. Code Status: Full Code.  Family Communication: No family at bedside. Son updated by telephone. Disposition Plan: Not ready for discharge, likely will not be ready for several more days.   Medical Consultants:    Nephrology  Gastroenterology  ID  Critical Care   Procedures:    None  Anti-Infectives:    Ampicillin 01/12/16--->  Vancomycin 01/12/16--->  Rocephin 01/12/16--->  Subjective:   The patient remains altered and somnolent. She specifically denies pain, dyspnea when asked, but mentation is altered so unsure if this is completely accurate.  Appears encephalopathic.  Objective:    Vitals:   01/13/16 1656 01/13/16 2215 01/14/16 0210 01/14/16 0506  BP: (!) 116/46 (!) 125/45  (!) 126/48  Pulse: 75 81  83  Resp: 18 19  18   Temp: 100.3 F (37.9 C) (!) 100.9 F (38.3 C) (!) 101.5 F (38.6 C) 100.1 F (37.8 C)  TempSrc: Oral   Axillary  SpO2: 98% 92%  (!) 82%  Weight:  106 kg (233 lb 9.6 oz)    Height:        Intake/Output Summary (Last 24 hours) at 01/14/16 0824 Last data filed at 01/14/16 0600  Gross per 24 hour  Intake          1059.94 ml  Output                0 ml  Net          1059.94 ml   Filed Weights   01/23/2016 2044 01/13/16 2215  Weight: 97.5 kg (215 lb) 106 kg (233 lb 9.6 oz)    Exam: General exam: Somnolent but able to awaken to stimuli. Respiratory system: Congested in the upper airways. Increased work of breathing. Cardiovascular system: S1 & S2 heard, RRR. No JVD,  rubs, gallops or clicks. No murmurs. Gastrointestinal system: Abdomen is nondistended, soft and nontender. No organomegaly or masses felt. Normal bowel sounds heard. Central nervous system: Somnolent/disoriented. No focal neurological deficits. Extremities: No clubbing,  or cyanosis. 1+ edema. Skin: 2 6 cm areas of skin breakdown, lower back and sacral area, wound base clean, no signs of  invasive infection. Psychiatry: Judgement and insight appear impaired. Mood & affect flat.   Data Reviewed:   I have personally reviewed following labs and imaging studies:  Labs: Basic Metabolic Panel:  Recent Labs Lab 2016-02-05 1615 01/12/16 0817 01/12/16 1637 01/13/16 1010 01/14/16 0540  NA 132* 132* 134* 136 138  K 5.5* 5.6* 5.7* 5.0 5.0  CL 101 101 103 100* 100*  CO2 17* 16* 16* 18* 22  GLUCOSE 167* 116* 106* 128* 160*  BUN 149* 158* 158* 157* 159*  CREATININE 6.06* 6.66* 6.73* 6.91* 7.34*  CALCIUM 8.4* 8.3* 8.3* 8.0* 7.7*   GFR Estimated Creatinine Clearance: 8.3 mL/min (by C-G formula based on SCr of 7.34 mg/dL (H)). Liver  Function Tests:  Recent Labs Lab 02/05/2016 1615 01/12/16 0817 01/13/16 1010 01/14/16 0540  AST 917* 913* 1,133* 1,161*  ALT 453* 441* 513* 507*  ALKPHOS 117 109 121 114  BILITOT 0.5 0.8 0.5 0.6  PROT 6.2* 5.7* 5.7* 5.7*  ALBUMIN 2.3* 2.0* 2.0* 1.9*   No results for input(s): LIPASE, AMYLASE in the last 168 hours.  Recent Labs Lab February 05, 2016 1615  AMMONIA 54*   CBC:  Recent Labs Lab 02/05/16 1615 01/12/16 0817 01/13/16 0444 01/14/16 0540  WBC 14.9* 13.2* 13.5* 17.7*  HGB 11.4* 10.6* 10.2* 10.8*  HCT 35.1* 32.6* 31.5* 33.4*  MCV 85.4 85.1 86.1 85.9  PLT 304 286 259 255   CBG:  Recent Labs Lab 01/13/16 1229 01/13/16 1655 01/13/16 2212 01/14/16 0636 01/14/16 0759  GLUCAP 111* 122* 140* 143* 143*   Microbiology Recent Results (from the past 240 hour(s))  Culture, Urine     Status: Abnormal   Collection Time: 01/12/16  4:56 AM  Result Value Ref Range Status   Specimen Description URINE, RANDOM  Final   Special Requests NONE  Final   Culture 50,000 COLONIES/mL YEAST (A)  Final   Report Status 01/13/2016 FINAL  Final  Culture, blood (routine x 2)     Status: None (Preliminary result)   Collection Time: 01/12/16 12:54 PM  Result Value Ref Range Status   Specimen Description BLOOD LEFT ARM  Final   Special Requests IN PEDIATRIC BOTTLE 1.0 ML  Final   Culture NO GROWTH 1 DAY  Final   Report Status PENDING  Incomplete  Culture, blood (routine x 2)     Status: None (Preliminary result)   Collection Time: 01/12/16 12:54 PM  Result Value Ref Range Status   Specimen Description BLOOD LEFT ARM  Final   Special Requests IN PEDIATRIC BOTTLE  Final   Culture NO GROWTH 1 DAY  Final   Report Status PENDING  Incomplete    Radiology: Mr Brain 54 Contrast  Result Date: 01/12/2016 CLINICAL DATA:  Altered mental status, extremity weakness and urinary retention after recent lumbar spine surgery. History of TIA, hypertension and diabetes. EXAM: MRI HEAD WITHOUT  CONTRAST TECHNIQUE: Multiplanar, multiecho pulse sequences of the brain and surrounding structures were obtained without intravenous contrast. COMPARISON:  CT HEAD 05-Feb-2016 and MRI of the head December 24, 2015 FINDINGS: BRAIN: No reduced diffusion to suggest acute ischemia. No susceptibility artifact to suggest hemorrhage (prior extra-axial susceptibility weighted artifact attributed to 3 tesla technique). The ventricles and sulci are normal for patient's age. No suspicious parenchymal signal, masses or mass effect. Minimal supratentorial and pontine white matter FLAIR T2 hyperintensities compatible with chronic small vessel ischemic disease, less than expected for age. No abnormal extra-axial fluid collections. No extra-axial masses though, contrast enhanced sequences would  be more sensitive. VASCULAR: Normal major intracranial vascular flow voids present at skull base. SKULL AND UPPER CERVICAL SPINE: Fluid-filled expanded partially empty sella. No suspicious calvarial bone marrow signal. Craniocervical junction maintained. Trace LEFT temporomandibular joint effusion. SINUSES/ORBITS: The mastoid air-cells and included paranasal sinuses are well-aerated. Subcentimeter LEFT nasal mucosal retention cyst. The included ocular globes and orbital contents are non-suspicious. OTHER: None. IMPRESSION: No acute intracranial process. Partially empty sella, otherwise negative MRI of the head for age. Electronically Signed   By: Awilda Metro M.D.   On: 01/12/2016 19:19   Mr Cervical Spine Wo Contrast  Result Date: 01/12/2016 CLINICAL DATA:  Recent lumbar surgery. Altered mental status. Bilateral lower extremity weakness and urinary retention. Bilateral upper extremity weakness. EXAM: MRI CERVICAL, THORACIC AND LUMBAR SPINE WITHOUT CONTRAST TECHNIQUE: Multiplanar and multiecho pulse sequences of the cervical spine, to include the craniocervical junction and cervicothoracic junction, and thoracic and lumbar  spine, were obtained without intravenous contrast. COMPARISON:  Lumbar spine MRI 11/23/2015 FINDINGS: MRI CERVICAL SPINE FINDINGS Alignment: Normal Vertebrae: No acute compression fracture, facet edema or focal marrow lesion. Cord: Normal signal and caliber Posterior Fossa, vertebral arteries, paraspinal tissues: Negative Disc levels: At C5-C6, there is a small disc osteophyte complex that narrows the ventral thecal sac and causes mild-to-moderate bilateral foraminal narrowing. Otherwise, there is no cervical spinal canal or foraminal stenosis. MRI THORACIC SPINE FINDINGS Alignment:  Normal Vertebrae: Suspected inferior endplate hemangioma at T7. Normal signal and caliber Paraspinal and other soft tissues: Negative Disc levels: No spinal canal or neural foraminal stenosis. MRI LUMBAR SPINE FINDINGS Segmentation:  Standard. Alignment:  Physiologic. Vertebrae: L3-L4 posterior spinal fusion with bilateral transpedicular screws. Conus medullaris: Extends to the L1 level and appears normal. No epidural collection. Paraspinal and other soft tissues: Postsurgical changes in the posterior paraspinal soft tissues with small fluid collection that has decreased in size compared to 11/23/2015. Disc levels: T12-L1: Left subarticular disc extrusion with inferior migration and narrowing of the left lateral recess. No central spinal canal stenosis. No neuroforaminal stenosis. L1-L2: Normal disc space and facet joints. No spinal canal stenosis. No neuroforaminal stenosis. L2-L3: Normal disc space and facet joints. No spinal canal stenosis. No neuroforaminal stenosis. L3-L4: Posterior decompression and postfusion changes. No spinal canal stenosis. No neuroforaminal stenosis. L4-L5: Severe bilateral facet hypertrophy and small disc bulge. No spinal canal stenosis. Mild right neuroforaminal stenosis. L5-S1: Small disc bulge. Mild bilateral facet hypertrophy. No spinal canal stenosis. No neuroforaminal stenosis. IMPRESSION: 1. No cord  compression, epidural abscess or hematoma or other finding to explain the reported acute symptoms. 2. Unchanged appearance of T12-L1 disc extrusion narrowing the left lateral recess. 3. Postfusion changes at L3-L4 without residual spinal canal stenosis. Electronically Signed   By: Deatra Robinson M.D.   On: 01/12/2016 20:24   Mr Thoracic Spine Wo Contrast  Result Date: 01/12/2016 CLINICAL DATA:  Recent lumbar surgery. Altered mental status. Bilateral lower extremity weakness and urinary retention. Bilateral upper extremity weakness. EXAM: MRI CERVICAL, THORACIC AND LUMBAR SPINE WITHOUT CONTRAST TECHNIQUE: Multiplanar and multiecho pulse sequences of the cervical spine, to include the craniocervical junction and cervicothoracic junction, and thoracic and lumbar spine, were obtained without intravenous contrast. COMPARISON:  Lumbar spine MRI 11/23/2015 FINDINGS: MRI CERVICAL SPINE FINDINGS Alignment: Normal Vertebrae: No acute compression fracture, facet edema or focal marrow lesion. Cord: Normal signal and caliber Posterior Fossa, vertebral arteries, paraspinal tissues: Negative Disc levels: At C5-C6, there is a small disc osteophyte complex that narrows the ventral thecal sac and causes mild-to-moderate  bilateral foraminal narrowing. Otherwise, there is no cervical spinal canal or foraminal stenosis. MRI THORACIC SPINE FINDINGS Alignment:  Normal Vertebrae: Suspected inferior endplate hemangioma at T7. Normal signal and caliber Paraspinal and other soft tissues: Negative Disc levels: No spinal canal or neural foraminal stenosis. MRI LUMBAR SPINE FINDINGS Segmentation:  Standard. Alignment:  Physiologic. Vertebrae: L3-L4 posterior spinal fusion with bilateral transpedicular screws. Conus medullaris: Extends to the L1 level and appears normal. No epidural collection. Paraspinal and other soft tissues: Postsurgical changes in the posterior paraspinal soft tissues with small fluid collection that has decreased in  size compared to 11/23/2015. Disc levels: T12-L1: Left subarticular disc extrusion with inferior migration and narrowing of the left lateral recess. No central spinal canal stenosis. No neuroforaminal stenosis. L1-L2: Normal disc space and facet joints. No spinal canal stenosis. No neuroforaminal stenosis. L2-L3: Normal disc space and facet joints. No spinal canal stenosis. No neuroforaminal stenosis. L3-L4: Posterior decompression and postfusion changes. No spinal canal stenosis. No neuroforaminal stenosis. L4-L5: Severe bilateral facet hypertrophy and small disc bulge. No spinal canal stenosis. Mild right neuroforaminal stenosis. L5-S1: Small disc bulge. Mild bilateral facet hypertrophy. No spinal canal stenosis. No neuroforaminal stenosis. IMPRESSION: 1. No cord compression, epidural abscess or hematoma or other finding to explain the reported acute symptoms. 2. Unchanged appearance of T12-L1 disc extrusion narrowing the left lateral recess. 3. Postfusion changes at L3-L4 without residual spinal canal stenosis. Electronically Signed   By: Deatra RobinsonKevin  Herman M.D.   On: 01/12/2016 20:24   Mr Lumbar Spine Wo Contrast  Result Date: 01/12/2016 CLINICAL DATA:  Recent lumbar surgery. Altered mental status. Bilateral lower extremity weakness and urinary retention. Bilateral upper extremity weakness. EXAM: MRI CERVICAL, THORACIC AND LUMBAR SPINE WITHOUT CONTRAST TECHNIQUE: Multiplanar and multiecho pulse sequences of the cervical spine, to include the craniocervical junction and cervicothoracic junction, and thoracic and lumbar spine, were obtained without intravenous contrast. COMPARISON:  Lumbar spine MRI 11/23/2015 FINDINGS: MRI CERVICAL SPINE FINDINGS Alignment: Normal Vertebrae: No acute compression fracture, facet edema or focal marrow lesion. Cord: Normal signal and caliber Posterior Fossa, vertebral arteries, paraspinal tissues: Negative Disc levels: At C5-C6, there is a small disc osteophyte complex that narrows  the ventral thecal sac and causes mild-to-moderate bilateral foraminal narrowing. Otherwise, there is no cervical spinal canal or foraminal stenosis. MRI THORACIC SPINE FINDINGS Alignment:  Normal Vertebrae: Suspected inferior endplate hemangioma at T7. Normal signal and caliber Paraspinal and other soft tissues: Negative Disc levels: No spinal canal or neural foraminal stenosis. MRI LUMBAR SPINE FINDINGS Segmentation:  Standard. Alignment:  Physiologic. Vertebrae: L3-L4 posterior spinal fusion with bilateral transpedicular screws. Conus medullaris: Extends to the L1 level and appears normal. No epidural collection. Paraspinal and other soft tissues: Postsurgical changes in the posterior paraspinal soft tissues with small fluid collection that has decreased in size compared to 11/23/2015. Disc levels: T12-L1: Left subarticular disc extrusion with inferior migration and narrowing of the left lateral recess. No central spinal canal stenosis. No neuroforaminal stenosis. L1-L2: Normal disc space and facet joints. No spinal canal stenosis. No neuroforaminal stenosis. L2-L3: Normal disc space and facet joints. No spinal canal stenosis. No neuroforaminal stenosis. L3-L4: Posterior decompression and postfusion changes. No spinal canal stenosis. No neuroforaminal stenosis. L4-L5: Severe bilateral facet hypertrophy and small disc bulge. No spinal canal stenosis. Mild right neuroforaminal stenosis. L5-S1: Small disc bulge. Mild bilateral facet hypertrophy. No spinal canal stenosis. No neuroforaminal stenosis. IMPRESSION: 1. No cord compression, epidural abscess or hematoma or other finding to explain the reported acute symptoms.  2. Unchanged appearance of T12-L1 disc extrusion narrowing the left lateral recess. 3. Postfusion changes at L3-L4 without residual spinal canal stenosis. Electronically Signed   By: Deatra Robinson M.D.   On: 01/12/2016 20:24   Dg Chest Port 1 View  Result Date: 01/14/2016 CLINICAL DATA:  Decreased  oxygen saturation on room air, intermittent fever, decreased level of consciousness. History of asthma, chronic renal insufficiency stage IV, NSTEMI. EXAM: PORTABLE CHEST 1 VIEW COMPARISON:  Chest x-ray of Feb 03, 2016 FINDINGS: The lungs are adequately inflated. There has been interval development of alveolar opacity likely in the right middle lobe inferior to the minor fissure. The cardiac silhouette remains enlarged. The pulmonary vascularity is normal. The mediastinum is normal in width. The bony thorax is unremarkable. IMPRESSION: Subsegmental atelectasis or early pneumonia likely in the right middle lobe abutting the minor fissure. When the patient can tolerate the procedure, a PA and lateral chest x-ray would be useful. Mild cardiomegaly without evidence of pulmonary edema. Electronically Signed   By: David  [place] M.D.   On: 01/14/2016 07:06   US Abdomen Limited Ruq  Result Date: 01/13/2016 CLINICAL DATA:  Elevated LFTs, history diabetes mellitus, hypertension, stage IV chronic kidney disease EXAM: US ABDOMEN LIMITED - RIGHT UPPER QUADRANT COMPARISON:  CT abdomen and pelvis 12/04/2011 FINDINGS: Gallbladder: Upper normal size. No gallstones identified. No wall thickening or pericholecystic fluid. Unable to assess for sonographic Murphy sign, patient unresponsive. Common bile duct: Diameter: Normal caliber 4 mm diameter. Liver: Degradation of image quality secondary to body habitus. No focal hepatic abnormalities are identified. Hepatopetal portal venous flow. No RIGHT upper quadrant free fluid. IMPRESSION: No sonographic abnormalities identified. Electronically Signed   By: Ulyses Southward M.D.   On: 01/13/2016 15:59    Medications:   . ampicillin (OMNIPEN) IV  2 g Intravenous Q24H  . cefTRIAXone (ROCEPHIN)  IV  2 g Intravenous Q12H  . chlorhexidine  15 mL Mouth Rinse BID  . insulin aspart  0-9 Units Subcutaneous TID WC  . insulin glargine  5 Units Subcutaneous Q2200  . mouth rinse  15 mL  Mouth Rinse q12n4p  . sodium chloride flush  3 mL Intravenous Q12H   Continuous Infusions: . argatroban 1 mcg/kg/min (01/14/16 0418)  .  sodium bicarbonate  infusion 1000 mL 50 mL/hr at 01/13/16 1738    Critical care time: 45 minutes.  Time in: 8:15 AM Time out: 9:00 AM   LOS: 3 days   Mazelle Huebert  [facility] Pager [phone]. If unable to reach me by pager, please call my cell phone at [phone].  *Please refer to [email or web address], password [email or web address] to get updated schedule on who will round on this patient, as hospitalists switch teams weekly. If 7PM-7AM, please contact night-coverage at [email or web address], password [email or web address] for any overnight needs.  01/14/2016, 8:24 AM

## 2016-01-14 NOTE — Consult Note (Signed)
PULMONARY / CRITICAL CARE MEDICINE   Name: Heidi Campbell MRN: [id number] DOB: 1946-01-14    ADMISSION DATE:  01/21/2016 CONSULTATION DATE:  01/13/2016  REFERRING MD: Dr. Darnelle Catalan  CHIEF COMPLAINT: Hypoxia     HISTORY OF PRESENT ILLNESS:   70 year old female with PMH of Type 2 DM, HTN, stage IV CKD, and NSTEMI. Presents to ED from SNF on 12/10 with worsening edema and lethargy. On 12/7 daughter noticed that the patient had become more lethargic with slurred speech, and increased swelling of hands and feet with minimal urinary output. During stay patient has been worked up for Acute encephalopathy related to uremia and acute renal failure with a creatinine of 6.06 (previous baseline 1.51). 12/12 PCCM was consulted for new hypoxia and MODS.  Of note patient was admitted 11/07/15-11/10/15 for neurosurgical decompression for degenerative spondylolisthesis with stenosis.  Rehospitalization from 12/05/15-12/12/15 for acute renal failure requiring iHD and a surgical wound infection, MR was negative for deep tissue infection.  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  PAST SURGICAL HISTORY: She  has a past surgical history that includes Abdominal hysterectomy; Breast surgery; arthroscopic knee (Right, 2015); Hand surgery (Left); Breast reduction surgery (Bilateral, 2006); Back surgery; ir generic historical (12/19/2015); and ir generic historical (12/19/2015).  Allergies  Allergen Reactions  . 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 Ace Inhibitors Cough  . Contrast Media [Iodinated Diagnostic Agents] Other (See Comments)    Reaction unknown  . Latex Other (See Comments)    Unknown reaction per MAR   . Shellfish Allergy Other (See Comments)    Pt cannot recall at this time    No current facility-administered medications on file prior to encounter.    Current Outpatient Prescriptions on File Prior to Encounter  Medication Sig  . acetaminophen (TYLENOL) 325 MG tablet Take 650 mg by mouth 3 (three) times daily. And every 4 hours as needed for pain/fever  . Amino Acids-Protein Hydrolys (FEEDING SUPPLEMENT, PRO-STAT SUGAR FREE 64,) LIQD Take 30 mLs by mouth 2 (two) times daily.  Marland Kitchen amLODipine (NORVASC) 10 MG tablet Take 1 tablet (10 mg total) by mouth daily.  Marland Kitchen apixaban (ELIQUIS) 5 MG TABS tablet Take 1 tablet (5 mg total) by mouth 2 (two) times daily.  . carvedilol (COREG) 3.125 MG tablet Take 1 tablet (3.125 mg total) by mouth 2 (two) times daily with a meal.  . doxazosin (CARDURA) 4 MG tablet Take 1 tablet (4 mg total) by mouth at bedtime.  Marland Kitchen ezetimibe (ZETIA) 10 MG tablet Take 1 tablet (10 mg total) by mouth daily.  . ferrous sulfate 325 (65 FE) MG tablet Take 1 tablet (325 mg total) by mouth 3 (three) times daily with meals.  . Fluticasone-Salmeterol (ADVAIR) 100-50 MCG/DOSE AEPB Inhale 1 puff into the lungs 2 (two) times daily.   Marland Kitchen gabapentin (NEURONTIN) 400 MG capsule Take 400 mg by mouth 3 (three) times daily.  . hydrALAZINE (APRESOLINE) 50 MG tablet Take 1 tablet by mouth 3 times a day. (Patient taking differently: Take 50 mg by  mouth 3 (three) times daily. )  . Insulin Glargine (LANTUS) 100 UNIT/ML Solostar Pen Inject 10 Units into the skin daily at 10 pm. (Patient taking differently: Inject 14 Units into the skin daily at 10 pm. )  . omeprazole (PRILOSEC) 20 MG capsule Take 20 mg by mouth daily at 6 (six) AM.   . rosuvastatin (CRESTOR) 40 MG tablet Take 1 tablet (40  mg total) by mouth daily. (Patient taking differently: Take 40 mg by mouth at bedtime. )  . albuterol (PROVENTIL HFA;VENTOLIN HFA) 108 (90 BASE) MCG/ACT inhaler Inhale 2 puffs into the lungs every 6 (six) hours as needed for wheezing or shortness of breath.  . bisacodyl (DULCOLAX) 10 MG suppository Place 10 mg rectally every 4 (four) hours as needed for moderate constipation.   . fluticasone (FLONASE) 50 MCG/ACT nasal spray Place 1 spray into both nostrils daily as needed for allergies.   . nitroGLYCERIN (NITROSTAT) 0.4 MG SL tablet Place 0.4 mg under the tongue every 5 (five) minutes as needed for chest pain.  Marland Kitchen ondansetron (ZOFRAN ODT) 4 MG disintegrating tablet Take 1 tablet (4 mg total) by mouth every 8 (eight) hours as needed for nausea or vomiting.  . polyethylene glycol (MIRALAX / GLYCOLAX) packet Take 17 g by mouth daily as needed (For constipation.).     FAMILY HISTORY:  Her indicated that her mother is deceased. She indicated that her father is deceased.    SOCIAL HISTORY: She  reports that she has never smoked. She has never used smokeless tobacco. She reports that she does not drink alcohol or use drugs.  REVIEW OF SYSTEMS:  Unable to obtain as pt is encephalopathic.  SUBJECTIVE:  On NRB, SpO2 in low 90's.  Mumbles non-sensible words.   VITAL SIGNS: BP 121/66   Pulse 81   Temp 99.8 F (37.7 C) (Axillary)   Resp 19   Ht 5\' 3"  (1.6 m)   Wt 106 kg (233 lb 9.6 oz)   SpO2 (!) 85%   BMI 41.38 kg/m   HEMODYNAMICS:    VENTILATOR SETTINGS:    INTAKE / OUTPUT: I/O last 3 completed shifts: In: 2381.1 [I.V.:2081.1; IV Piggyback:300] Out: 400 [Urine:400]  PHYSICAL EXAMINATION: General: Adult female, minimally responsive. Neuro: Non-responsive.  Mumbles non-sensible sounds with noxious stimuli.  HEENT: [place]/AT. PERRL, sclerae anicteric. Cardiovascular: RRR, no M/R/G.  Lungs: Respirations even and unlabored. Faint bibasilar crackles. Abdomen: BS x 4, soft, NT/ND.   Musculoskeletal: No gross deformities, no edema.  Skin: Intact, warm, no rashes.   LABS:  BMET  Recent Labs Lab 01/12/16 1637 01/13/16 1010 01/14/16 0540  NA 134* 136 138  K 5.7* 5.0 5.0  CL 103 100* 100*  CO2 16* 18* 22  BUN 158* 157* 159*  CREATININE 6.73* 6.91* 7.34*  GLUCOSE 106* 128* 160*    Electrolytes  Recent Labs Lab 01/12/16 1637 01/13/16 1010 01/14/16 0540  CALCIUM 8.3* 8.0* 7.7*    CBC  Recent Labs Lab 01/12/16 0817 01/13/16 0444 01/14/16 0540  WBC 13.2* 13.5* 17.7*  HGB 10.6* 10.2* 10.8*  HCT 32.6* 31.5* 33.4*  PLT 286 259 255    Coag's  Recent Labs Lab 01/13/16 0444 01/13/16 1636 01/14/16 0540  APTT 82* 81* 74*  INR  --   --  7.97*    Sepsis Markers No results for input(s): LATICACIDVEN, PROCALCITON, O2SATVEN in the last 168 hours.  ABG  Recent Labs Lab 2016/02/06 1632 01/14/16 0623  PHART 7.365 7.407  PCO2ART 28.7* 33.1  PO2ART 91.0 102  Liver Enzymes  Recent Labs Lab 01/12/16 0817 01/13/16 1010 01/14/16 0540  AST 913* 1,133* 1,161*  ALT 441* 513* 507*  ALKPHOS 109 121 114  BILITOT 0.8 0.5 0.6  ALBUMIN 2.0* 2.0* 1.9*    Cardiac Enzymes No results for input(s): TROPONINI, PROBNP in the last 168 hours.  Glucose  Recent Labs Lab 01/13/16 1229 01/13/16 1655 01/13/16 2212 01/14/16 0636 01/14/16 0759 01/14/16 1135  GLUCAP 111* 122* 140* 143* 143* 160*    Imaging Ct Abdomen Wo Contrast  Result Date: 01/14/2016 CLINICAL DATA:  Elevated LFTs EXAM: CT ABDOMEN WITHOUT CONTRAST TECHNIQUE: Multidetector CT imaging of the abdomen was performed following the standard protocol without IV contrast. COMPARISON:  12/04/2011 FINDINGS: Lower chest: There is atelectasis of the right lower lobe. No pleural effusion identified. Hepatobiliary: Within the limitations of noncontrast technique there is no focal liver abnormality diffuse hepatic steatosis noted. Unremarkable appearance of the gallbladder. No biliary  dilatation. Pancreas: Unremarkable. No pancreatic ductal dilatation or surrounding inflammatory changes. Spleen: Normal in size without focal abnormality. Adrenals/Urinary Tract: The adrenal glands are normal. No kidney stones or hydronephrosis. Stomach/Bowel: Stomach is within normal limits. Appendix appears normal. No evidence of bowel wall thickening, distention, or inflammatory changes. Vascular/Lymphatic: Aortic atherosclerosis. No aneurysm. No upper abdominal adenopathy. Other: There is no ascites or focal fluid collections within the abdomen or pelvis. 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 The patient is status post posterior fusion of L3-4. IMPRESSION: 1. No acute findings within the abdomen. 2. Hepatic steatosis 3. Atelectasis of the right lower lobe. Electronically Signed   By: Signa Kellaylor  Stroud M.D.   On: 01/14/2016 09:54   Dg Chest Port 1 View  Result Date: 01/14/2016 CLINICAL DATA:  Decreased oxygen saturation on room air, intermittent fever, decreased level of consciousness. History of asthma, chronic renal insufficiency stage IV, NSTEMI. EXAM: PORTABLE CHEST 1 VIEW COMPARISON:  Chest x-ray of January 11, 2016 FINDINGS: The lungs are adequately inflated. There has been interval development of alveolar opacity likely in the right middle lobe inferior to the minor fissure. The cardiac silhouette remains enlarged. The pulmonary vascularity is normal. The mediastinum is normal in width. The bony thorax is unremarkable. IMPRESSION: Subsegmental atelectasis or early pneumonia likely in the right middle lobe abutting the minor fissure. When the patient can tolerate the procedure, a PA and lateral chest x-ray would be useful. Mild cardiomegaly without evidence of pulmonary edema. Electronically Signed   By: David  SwazilandJordan M.D.   On: 01/14/2016 07:06   [place] Abdomen Limited Ruq  Result Date: 01/13/2016 CLINICAL DATA:  Elevated LFTs, history diabetes mellitus,  hypertension, stage IV chronic kidney disease EXAM: US ABDOMEN LIMITED - RIGHT UPPER QUADRANT COMPARISON:  CT abdomen and pelvis 12/04/2011 FINDINGS: Gallbladder: Upper normal size. No gallstones identified. No wall thickening or pericholecystic fluid. Unable to assess for sonographic Murphy sign, patient unresponsive. Common bile duct: Diameter: Normal caliber 4 mm diameter. Liver: Degradation of image quality secondary to body habitus. No focal hepatic abnormalities are identified. Hepatopetal portal venous flow. No RIGHT upper quadrant free fluid. IMPRESSION: No sonographic abnormalities identified. Electronically Signed   By: Ulyses SouthwardMark  Boles M.D.   On: 01/13/2016 15:59     STUDIES:  CT Head 12/10 > Neg acute  MR Brain 12/11 > Partially empty sella  MR C/L/T Spine 12/11 > Neg CT ABD 12/13 > Hepatic Steatosis, RLL Atelectasis   CULTURES: Urine 12/11 > 50,000 col yeast  Blood 12/11 >  Blood 12/13 >  ANTIBIOTICS: Ampicillin 12/11 >> Rocephin 12/11 >> Vancomycin 12/11 > 12/11   SIGNIFICANT EVENTS: 12/10 > Presents to ED with 3 day history of AMS, swelling of extremities, and decreased urinary output  12/12 > PCCM called    DISCUSSION:  Heidi Campbell is a 70 y.o. F who was admitted to [facility] from SNF 12/10 with worsening AMS and hypersomnolence.  She has hx of neurosurgical decompression and degenerative spondylolisthesis with stenosis in October 2017.  She was then readmitted November 2017 with acute renal failure requiring iHD.  She had 3rd admission again November 2017 for NSTEMI and thrombocytopenia later found to be due to HIT.  Since admission 12/10, she has developed worsening renal failure as well as new hepatic failure.  Unfortunately on 12/13, she developed acute hypoxic respiratory failure / MODS and was transferred to SDU for further monitoring.  PCCM was called in consultation due to concern that she would require intubation.  Upon our arrival, extensive discussion was had  between Dr. Molli Knock and pt's one daughter at bedside and 2nd daughter over phone.  Circumstances and condition reviewed and daughters requested that they have time to speak with their brother.  After discussing with brother, decision / agreement was made that Heidi Campbell would NOT wish to pursue life support or heroic measures and that they are opting for continued support care with plans to move to comfort measures once family all arrives at bedside.   ASSESSMENT / PLAN:  Acute hypoxic respiratory failure - suspect combo of pulm edema + AMS + sepsis. Sepsis - unclear etiology at this point.  Cultures pending. AoCKD - renal US neg.  Transaminitis - suspect due to sepsis. RUQ pending. 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 Acute encephalopathy - likely multifactorial. Right arm weakness per report - MRI neg for acute process. Hx L3-L4 decompression Oct 2017.  Continue supportive measures and once family arrives, will move to comfort care.  This was relayed to Dr. Darnelle Catalan and Dr. Ninetta Lights.  DNR orders and comfort meds placed.    Rutherford Guys, [place] [facility] Pulmonary & Critical Care Medicine Pager: [phone]  or [phone] 01/14/2016, 2:37 PM  Attending Note:  70 year old female with extensive PMH who presents now with respiratory failure, liver failure, renal failure and AMS.  Patient would require intubation and HD, unable to predict future of liver function.  On exam, she is completely unresponsive and on 100% NRB with sat of 81%.  Daughter is bedside, I spoke with her daughter bedside and daughter over the phone.  After discussion and explanation of the steps required to maintain patient's vital signs the children decided that the patient has been suffering for months and at this point they would rather her be comfortable.  I spoke with Dr. Darnelle Catalan.  Will make DNR and make morphine available PRN.  The patient is critically ill with multiple organ systems failure and requires high  complexity decision making for assessment and support, frequent evaluation and titration of therapies, application of advanced monitoring technologies and extensive interpretation of multiple databases.   Critical Care Time devoted to patient care services described in this note is  35  Minutes. This time reflects time of care of this signee Dr Koren Bound. This critical care time does not reflect procedure time, or teaching time or supervisory time of PA/NP/Med student/Med Resident etc but could involve care discussion time.  Alyson Reedy, M.D. [facility] Pulmonary/Critical Care Medicine. Pager: [phone]. After hours  pager: [phone].

## 2016-01-14 NOTE — Progress Notes (Signed)
Patient arrived on unit with Non-breather unable to maintain her oxygen saturation.  02 maintaining in the 80's.  Contacted Dr. Darnelle Catalanama and rapid response.  Updated Dr. Darnelle Catalanama on patients change in condition, received an order for lasix.  Dr. Darnelle Catalanama to consult CCM.  Will continue to monitor.

## 2016-01-14 NOTE — Progress Notes (Signed)
Per Dr. Marisue HumbleSanford, no HD for Heidi Campbell. Pt is on comfort care.

## 2016-01-14 NOTE — Progress Notes (Addendum)
ANTICOAGULATION and ANTIBIOTIC CONSULT NOTE - Follow Up Consult  Pharmacy Consult for argatroban (apixaban on hold), vancomycin, ampicillin and ceftriaxone Indications: Recent NSTEMI/TIA from possible afib, Hx HIT; possible meningitis.   Allergies  Allergen Reactions  . 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 Ace Inhibitors Cough  . Contrast Media [Iodinated Diagnostic Agents] Other (See Comments)    Reaction unknown  . Latex Other (See Comments)    Unknown reaction per MAR   . Shellfish Allergy Other (See Comments)    Pt cannot recall at this time    Patient Measurements: Height: 5\' 3"  (160 cm) Weight: 233 lb 9.6 oz (106 kg) IBW/kg (Calculated) : 52.4  Vital Signs: Temp: 100.1 F (37.8 C) (12/13 0506) Temp Source: Axillary (12/13 0506) BP: 126/48 (12/13 0506) Pulse Rate: 83 (12/13 0506)  Labs:  Recent Labs  01/12/16 0817 01/12/16 1637 01/13/16 0444 01/13/16 1010 01/13/16 1636 01/14/16 0540  HGB 10.6*  --  10.2*  --   --  10.8*  HCT 32.6*  --  31.5*  --   --  33.4*  PLT 286  --  259  --   --  255  APTT 78* 64* 82*  --  81* 74*  LABPROT  --   --   --   --   --  69.5*  INR  --   --   --   --   --  7.97*  CREATININE 6.66* 6.73*  --  6.91*  --  7.34*    Estimated Creatinine Clearance: 8.3 mL/min (by C-G formula based on SCr of 7.34 mg/dL (H)).   Assessment: 70yoF with recent admission for NSTEMI, TIA, possible PAF> currently in SR, and Hx HIT - discharged on eliquis with planned duration for 3 months. She is now in acute renal failure and uremic so holding eliquis and covering with argatroban. Last eliquis dose 12/10 AM. Currently  therapeutic on argatroban 661mcg/kg/min aPTT 74sec.  -Hgb low stable, PLTC wnl,  No s/sx bleeding noted.   Patient now febrile at 101.5, WBC increased to 17.7, 12/11 MRI spine/head - negative, 12/12 RUQ scan - negative, 12/13 CXR - atelectasis vs early PNA,  CT of abdomen ordered.  Patient with  liver dysfunction evidenced by elevated LFTs (AST= 1161, ALT =507) and encephalopathic picture. Patient also with acute kidney injury,SCr continues to rise, now at 7.34 (baseline ~1) and eCrCl <8710mL/min. Started on sodium bicarbonate infusion per renal service.  Antimicrobials this admission: Vanc 12/11>> *2g IV x1 given 12/11 Ampicillin 12/11>> CTX 12/11>>  Dose changes: 12/13 CTX dec to 1g q12 given hepatic and renal dysfunction  Cultures: 12/11 urine: 50k colonies yeast 12/11 BCx: [id number]  Goal of Therapy:  aPTT 50-90 seconds Monitor platelets by anticoagulation protocol: Yes   Plan:  -Cont argatroban 1 mcg/kg/min -12 hour aPTTs at 0500 and 1700 due to renal function  -Monitor for s/sx bleeding, CBC, renal function -Vancomycin trough today at 1730 to assess need for re-dosing ( Vancomycin 2g (20mg /kg) IV x1 last given 12/11) - Decrease Ceftriaxone to 1g IV q12h given concomitant hepatic and renal dysfunction -Continue Ampicillin 2g IV q24h -Follow c/s, renal function, clinical progression, CT abdomen, vanc levels PRN  Heidi Campbell, Pharm.D. PGY1 Pharmacy Resident 12/13/20178:35 AM Pager [phone]  ------------------ Addendum 01/14/2016 9:54 AM - argatroban now on hold to place HD catheter, starting dialysis today -d/c aptt for now -Follow up resumption when appropriate, consult still active

## 2016-01-14 NOTE — Progress Notes (Signed)
Report given to Britta MccreedyBarbara, RN on [facility].  All questions answered.

## 2016-01-14 NOTE — Progress Notes (Signed)
[facility] Progress Note  Heidi Campbell 70 y.o. Jan 07, 1946   Subjective: Somnolent and slow to respond. Will open eyes slowly to command. Unable to get any history from her. Family at bedside.  Objective: Vital signs in last 24 hours: Vitals:   01/14/16 1023 01/14/16 1051  BP: (!) 122/56 (!) 110/57  Pulse: 76 79  Resp: 16 16  Temp: 98.7 F (37.1 C) 98.1 F (36.7 C)    Physical Exam: Gen: somnolent, elderly, frail, obese CV: RRR Chest: Coarse breath sounds Abd: diffuse tenderness with guarding, +distention, +BS  Lab Results:  Recent Labs  01/13/16 1010 01/14/16 0540  NA 136 138  K 5.0 5.0  CL 100* 100*  CO2 18* 22  GLUCOSE 128* 160*  BUN 157* 159*  CREATININE 6.91* 7.34*  CALCIUM 8.0* 7.7*    Recent Labs  01/13/16 1010 01/14/16 0540  AST 1,133* 1,161*  ALT 513* 507*  ALKPHOS 121 114  BILITOT 0.5 0.6  PROT 5.7* 5.7*  ALBUMIN 2.0* 1.9*    Recent Labs  01/13/16 0444 01/14/16 0540  WBC 13.5* 17.7*  HGB 10.2* 10.8*  HCT 31.5* 33.4*  MCV 86.1 85.9  PLT 259 255    Recent Labs  01/14/16 0540  LABPROT 69.5*  INR 7.97*      Assessment/Plan: Hepatic dysfunction concerning for liver failure with elevated INR and markedly elevated AST/ALT. Source of transaminitis likely multi-factorial. Noncontrast CT unrevealing for a source of the elevated liver enzymes. Agree with FFP. Supportive care. Recommend palliative measures. No further GI recs. Will sign off. Call if questions.   Dallyn Bergland C. 01/14/2016, 11:13 AM   Pager [phone]  If no answer or after 5 PM call [phone] ID: Heidi Campbell, female   DOB: Jan 07, 1946, 70 y.o.   MRN: [id number]

## 2016-01-14 NOTE — Consult Note (Signed)
[facility]  Date of Admission:  01/22/2016  Date of Consult:  01/14/2016  Reason for Consult: sepsis Referring Physician: Dr. Rockne Menghini  Impression/Recommendation  Severe Sepsis of unclear etiology with possible end organ damage WBC 17.7 today, last fever 101.5 overnight. Has lot of secretions, CXR showing possible early PNA vs atelectasis.  ucx showing yeast. bcx ngtd.  MRI Lspine and Cspine showed no epidural abscess.  Suspicion is low for bacterial meningitis given her slow decline over few days at the nursing home. Will not recommend LP with her recent back surgery since we have other potential sources of infection.  -would consider covering her for possible HCAP, recommend continuing vanc and ceftriaxone. If continues to have fever, would need to broader coverage to cover for pseudomonas.   - could consider d/cing ampicillin - would recommend treating for possible yeast UTI with fluconazole  AKI - renal u/s neg. Unclear etiology, could be 2/2 to end organ damage from septic, pre-renal from poor PO intake, or from yeast UTI. - nephrology following. Failed to improve despite fluid resusc, will be starting HD today.  Hepatic failure - significant LFT elevation along with high INR. Unclear etiology, could be 2/2 to sepsis and end organ damage.  - GI is on board. RUQ u/s pending.   AMS - likely multifactorial from AKI, Hepatic failure and sepsis. - could consider lactulose if does not improve.  Right arm weakness - reported by daughter. Unable to do full exam due to patient's mental status. MRI brain was negative for stroke on 12/11. MRI Cspine does not show any lesions to explain this either.  - cont to monitor.   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   Thank you so much for this interesting consult,   Heidi Campbell (pager) [phone] [email or web address]  Heidi Campbell is an 70 y.o. female.  HPI: with multiple PMH including CKD IV, DM II, NSTEMI, who  recently had multiple hospitalizations since her back surgery for L3 decompression on 10/16. She was doing well after surgery until 10/12 when she had small hematoma at the surgical site. Then again returned to hospital on 11/3 for AMS thought to be from AKI requiring 1 session of HD. 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 Was treated with argatroban. Was put on Eliquis for possible Afib. Was discharged to [facility] rehab on 11/26. She was doing fairly well after discharge but since Dec 4th, her daughter noticed that her mental status was slowly declining. She stopped feeding herself, became less talkative and more lethargic everyday. She stopped using her right arm per daughter and started to drop things from her right arm. Due to her AMS she was brought back to the hospital on 12/10. During admissio nshe was found to have AKI again with BUN 149, Crt 6.06 and also K of 5.5. Also had Transaminitis. Ucx grew yeast. Nephrology recommended fluid for AKI which failed to improve her status, and then recommended doing HD again starting today. GI has been also consulted for her acute liver failure.   Patient has been lethargic since being in the hospital. Had fevers starting yesterday evening and overnight. BP remains normal. LFTs continued to worsen. We were consulted today to help manage her sepsis.   Past Medical History:  Diagnosis Date  . ALLERGIC RHINITIS   . Arthritis   . ASTHMA   . Asthma   . CKD (chronic kidney disease),  stage IV (HCC)   . Diabetes mellitus without complication (HCC)   . Esophageal reflux   . Hypertension   . NSTEMI (non-ST elevated myocardial infarction) (HCC)   . SINUSITIS, ACUTE     Past Surgical History:  Procedure Laterality Date  . ABDOMINAL HYSTERECTOMY    . arthroscopic knee Right 2015  . BACK SURGERY    . BREAST REDUCTION SURGERY Bilateral 2006  . BREAST SURGERY    . HAND SURGERY Left    damaged  nerve in hand  . IR GENERIC HISTORICAL  12/19/2015   IR FLUORO GUIDE CV LINE RIGHT 12/19/2015 Oley Balm, MD [facility]  . IR GENERIC HISTORICAL  12/19/2015   IR US GUIDE VASC ACCESS RIGHT [facility]     Allergies  Allergen Reactions  . 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 Ace Inhibitors Cough  . Contrast Media [Iodinated Diagnostic Agents] Other (See Comments)    Reaction unknown  . Latex Other (See Comments)    Unknown reaction per MAR   . Shellfish Allergy Other (See Comments)    Pt cannot recall at this time    Medications: I have reviewed the patient's current medications.  Abtx:  Anti-infectives    Start     Dose/Rate Route Frequency Ordered Stop   01/14/16 1500  cefTRIAXone (ROCEPHIN) 1 g in dextrose 5 % 50 mL IVPB     1 g 100 mL/hr over 30 Minutes Intravenous Every 12 hours 01/14/16 0832     01/12/16 1530  vancomycin (VANCOCIN) 2,000 mg in sodium chloride 0.9 % 500 mL IVPB     2,000 mg 250 mL/hr over 120 Minutes Intravenous  Once 01/12/16 1349 01/12/16 1920   01/12/16 1500  cefTRIAXone (ROCEPHIN) 2 g in dextrose 5 % 50 mL IVPB  Status:  Discontinued     2 g 100 mL/hr over 30 Minutes Intravenous Every 12 hours 01/12/16 1349 01/14/16 0832   01/12/16 1400  ampicillin (OMNIPEN) 2 g in sodium chloride 0.9 % 50 mL IVPB     2 g 150 mL/hr over 20 Minutes Intravenous Every 24 hours 01/12/16 1349     01/12/16 1330  ampicillin (OMNIPEN) 2 g in sodium chloride 0.9 % 50 mL IVPB  Status:  Discontinued     2 g 150 mL/hr over 20 Minutes Intravenous Every 24 hours 01/12/16 1308 01/12/16 1349      Total days of antibiotics: amp, vanc, ceftriaxone day 2  vanc 12/12 >> ceftx 12/12 >> Amp 12/12 >>           Social History:  reports that she has never smoked. She has never used smokeless tobacco. She reports that she does not drink alcohol or use drugs.  Family History  Problem Relation Age of Onset  . Diabetes Mother   .  Heart attack Father   . Heart disease Father     Unable to perform ROS due to patient's mental status.  Blood pressure (!) 126/48, pulse 83, temperature 100.1 F (37.8 C), temperature source Axillary, resp. rate 18, height 5\' 3"  (1.6 m), weight 233 lb 9.6 oz (106 kg), SpO2 (!) 82 %. Physical Exam  Constitutional:  Obese female. Lethargic  HENT:  Head: Normocephalic and atraumatic.  Has lot of secretion.   Eyes: Conjunctivae are normal. Pupils are equal, round, and reactive to light.  Unable to test EOM due to her mental status.   Cardiovascular: Normal rate and regular rhythm.  Exam  reveals no gallop and no friction rub.   No murmur heard. Respiratory:  Exam limited to the anterior field. No crackles or wheezing.   Musculoskeletal:  Both feet have dry skin with some cracking. Has protective boot. Has Incisional wound from her Lspine surgery on back which I did not look at today.   Neurological:  Lethargic. Wakes up to questions. Able to only tell me her first name. Follows very few ommands. Does not move either her arms or legs when asked.  Skin: Skin is warm and dry.      Results for orders placed or performed during the hospital encounter of 01/09/2016 (from the past 48 hour(s))  Glucose, capillary     Status: Abnormal   Collection Time: 01/12/16 12:06 PM  Result Value Ref Range   Glucose-Capillary 111 (H) 65 - 99 mg/dL  Culture, blood (routine x 2)     Status: None (Preliminary result)   Collection Time: 01/12/16 12:54 PM  Result Value Ref Range   Specimen Description BLOOD LEFT ARM    Special Requests IN PEDIATRIC BOTTLE 1.0 ML    Culture NO GROWTH 1 DAY    Report Status PENDING   Culture, blood (routine x 2)     Status: None (Preliminary result)   Collection Time: 01/12/16 12:54 PM  Result Value Ref Range   Specimen Description BLOOD LEFT ARM    Special Requests IN PEDIATRIC BOTTLE 1ML    Culture NO GROWTH 1 DAY    Report Status PENDING   APTT     Status: Abnormal     Collection Time: 01/12/16  4:37 PM  Result Value Ref Range   aPTT 64 (H) 24 - 36 seconds    Comment:        IF BASELINE aPTT IS ELEVATED, SUGGEST PATIENT RISK ASSESSMENT BE USED TO DETERMINE APPROPRIATE ANTICOAGULANT THERAPY.   Basic metabolic panel     Status: Abnormal   Collection Time: 01/12/16  4:37 PM  Result Value Ref Range   Sodium 134 (L) 135 - 145 mmol/L   Potassium 5.7 (H) 3.5 - 5.1 mmol/L   Chloride 103 101 - 111 mmol/L   CO2 16 (L) 22 - 32 mmol/L   Glucose, Bld 106 (H) 65 - 99 mg/dL   BUN 158 (H) 6 - 20 mg/dL   Creatinine, Ser 6.73 (H) 0.44 - 1.00 mg/dL   Calcium 8.3 (L) 8.9 - 10.3 mg/dL   GFR calc non Af Amer 6 (L) >60 mL/min   GFR calc Af Amer 6 (L) >60 mL/min    Comment: (NOTE) The eGFR has been calculated using the CKD EPI equation. This calculation has not been validated in all clinical situations. eGFR's persistently <60 mL/min signify possible Chronic Kidney Disease.    Anion gap 15 5 - 15  Glucose, capillary     Status: None   Collection Time: 01/12/16  5:06 PM  Result Value Ref Range   Glucose-Capillary 91 65 - 99 mg/dL   Comment 1 Notify RN   Glucose, capillary     Status: None   Collection Time: 01/12/16 10:21 PM  Result Value Ref Range   Glucose-Capillary 88 65 - 99 mg/dL  APTT     Status: Abnormal   Collection Time: 01/13/16  4:44 AM  Result Value Ref Range   aPTT 82 (H) 24 - 36 seconds    Comment:        IF BASELINE aPTT IS ELEVATED, SUGGEST PATIENT RISK ASSESSMENT BE USED TO DETERMINE  APPROPRIATE ANTICOAGULANT THERAPY.   CBC     Status: Abnormal   Collection Time: 01/13/16  4:44 AM  Result Value Ref Range   WBC 13.5 (H) 4.0 - 10.5 K/uL   RBC 3.66 (L) 3.87 - 5.11 MIL/uL   Hemoglobin 10.2 (L) 12.0 - 15.0 g/dL   HCT 31.5 (L) 36.0 - 46.0 %   MCV 86.1 78.0 - 100.0 fL   MCH 27.9 26.0 - 34.0 pg   MCHC 32.4 30.0 - 36.0 g/dL   RDW 15.5 11.5 - 15.5 %   Platelets 259 150 - 400 K/uL  Glucose, capillary     Status: Abnormal   Collection  Time: 01/13/16  8:17 AM  Result Value Ref Range   Glucose-Capillary 126 (H) 65 - 99 mg/dL  Comprehensive metabolic panel     Status: Abnormal   Collection Time: 01/13/16 10:10 AM  Result Value Ref Range   Sodium 136 135 - 145 mmol/L   Potassium 5.0 3.5 - 5.1 mmol/L   Chloride 100 (L) 101 - 111 mmol/L   CO2 18 (L) 22 - 32 mmol/L   Glucose, Bld 128 (H) 65 - 99 mg/dL   BUN 157 (H) 6 - 20 mg/dL   Creatinine, Ser 6.91 (H) 0.44 - 1.00 mg/dL   Calcium 8.0 (L) 8.9 - 10.3 mg/dL   Total Protein 5.7 (L) 6.5 - 8.1 g/dL   Albumin 2.0 (L) 3.5 - 5.0 g/dL   AST 1,133 (H) 15 - 41 U/L   ALT 513 (H) 14 - 54 U/L   Alkaline Phosphatase 121 38 - 126 U/L   Total Bilirubin 0.5 0.3 - 1.2 mg/dL   GFR calc non Af Amer 5 (L) >60 mL/min   GFR calc Af Amer 6 (L) >60 mL/min    Comment: (NOTE) The eGFR has been calculated using the CKD EPI equation. This calculation has not been validated in all clinical situations. eGFR's persistently <60 mL/min signify possible Chronic Kidney Disease.    Anion gap 18 (H) 5 - 15  Glucose, capillary     Status: Abnormal   Collection Time: 01/13/16 12:29 PM  Result Value Ref Range   Glucose-Capillary 111 (H) 65 - 99 mg/dL  APTT     Status: Abnormal   Collection Time: 01/13/16  4:36 PM  Result Value Ref Range   aPTT 81 (H) 24 - 36 seconds    Comment:        IF BASELINE aPTT IS ELEVATED, SUGGEST PATIENT RISK ASSESSMENT BE USED TO DETERMINE APPROPRIATE ANTICOAGULANT THERAPY.   Glucose, capillary     Status: Abnormal   Collection Time: 01/13/16  4:55 PM  Result Value Ref Range   Glucose-Capillary 122 (H) 65 - 99 mg/dL  Glucose, capillary     Status: Abnormal   Collection Time: 01/13/16 10:12 PM  Result Value Ref Range   Glucose-Capillary 140 (H) 65 - 99 mg/dL  APTT     Status: Abnormal   Collection Time: 01/14/16  5:40 AM  Result Value Ref Range   aPTT 74 (H) 24 - 36 seconds    Comment:        IF BASELINE aPTT IS ELEVATED, SUGGEST PATIENT RISK ASSESSMENT BE USED  TO DETERMINE APPROPRIATE ANTICOAGULANT THERAPY.   CBC     Status: Abnormal   Collection Time: 01/14/16  5:40 AM  Result Value Ref Range   WBC 17.7 (H) 4.0 - 10.5 K/uL   RBC 3.89 3.87 - 5.11 MIL/uL   Hemoglobin 10.8 (L) 12.0 -  15.0 g/dL   HCT 83.5 (L) 07.5 - 73.2 %   MCV 85.9 78.0 - 100.0 fL   MCH 27.8 26.0 - 34.0 pg   MCHC 32.3 30.0 - 36.0 g/dL   RDW 25.6 72.0 - 91.9 %   Platelets 255 150 - 400 K/uL  Protime-INR     Status: Abnormal   Collection Time: 01/14/16  5:40 AM  Result Value Ref Range   Prothrombin Time 69.5 (H) 11.4 - 15.2 seconds   INR 7.97 (HH)     Comment: REPEATED TO VERIFY CRITICAL RESULT CALLED TO, READ BACK BY AND VERIFIED WITH: GREENAWALT,R RN @ 0753 01/14/16 LEONARD,A   Lactate dehydrogenase     Status: Abnormal   Collection Time: 01/14/16  5:40 AM  Result Value Ref Range   LDH 2,323 (H) 98 - 192 U/L  Comprehensive metabolic panel     Status: Abnormal   Collection Time: 01/14/16  5:40 AM  Result Value Ref Range   Sodium 138 135 - 145 mmol/L   Potassium 5.0 3.5 - 5.1 mmol/L   Chloride 100 (L) 101 - 111 mmol/L   CO2 22 22 - 32 mmol/L   Glucose, Bld 160 (H) 65 - 99 mg/dL   BUN 802 (H) 6 - 20 mg/dL   Creatinine, Ser 2.17 (H) 0.44 - 1.00 mg/dL   Calcium 7.7 (L) 8.9 - 10.3 mg/dL   Total Protein 5.7 (L) 6.5 - 8.1 g/dL   Albumin 1.9 (L) 3.5 - 5.0 g/dL   AST 9,810 (H) 15 - 41 U/L   ALT 507 (H) 14 - 54 U/L   Alkaline Phosphatase 114 38 - 126 U/L   Total Bilirubin 0.6 0.3 - 1.2 mg/dL   GFR calc non Af Amer 5 (L) >60 mL/min   GFR calc Af Amer 6 (L) >60 mL/min    Comment: (NOTE) The eGFR has been calculated using the CKD EPI equation. This calculation has not been validated in all clinical situations. eGFR's persistently <60 mL/min signify possible Chronic Kidney Disease.    Anion gap 16 (H) 5 - 15  Blood gas, arterial     Status: Abnormal   Collection Time: 01/14/16  6:23 AM  Result Value Ref Range   O2 Content 4.0 L/min   Delivery systems NASAL CANNULA     pH, Arterial 7.407 7.350 - 7.450   pCO2 arterial 33.1 32.0 - 48.0 mmHg   pO2, Arterial 102 83.0 - 108.0 mmHg   Bicarbonate 20.4 20.0 - 28.0 mmol/L   Acid-base deficit 3.4 (H) 0.0 - 2.0 mmol/L   O2 Saturation 96.8 %   Patient temperature 98.6    Collection site LEFT RADIAL    Drawn by [id number]    Sample type ARTERIAL    Allens test (pass/fail) PASS PASS  Glucose, capillary     Status: Abnormal   Collection Time: 01/14/16  6:36 AM  Result Value Ref Range   Glucose-Capillary 143 (H) 65 - 99 mg/dL  Glucose, capillary     Status: Abnormal   Collection Time: 01/14/16  7:59 AM  Result Value Ref Range   Glucose-Capillary 143 (H) 65 - 99 mg/dL  Prepare fresh frozen plasma     Status: None (Preliminary result)   Collection Time: 01/14/16  9:06 AM  Result Value Ref Range   Blood Product Unit Number [id number]    Unit Type and Rh 8400    Blood Product Expiration Date 591368599234    Blood Product Unit Number [id number]    Unit Type  and Rh 8400    Blood Product Expiration Date 962229798921       Component Value Date/Time   SDES BLOOD LEFT ARM 01/12/2016 1254   SDES BLOOD LEFT ARM 01/12/2016 1254   SPECREQUEST IN PEDIATRIC BOTTLE 1.0 ML 01/12/2016 1254   SPECREQUEST IN PEDIATRIC BOTTLE 1ML 01/12/2016 1254   CULT NO GROWTH 1 DAY 01/12/2016 1254   CULT NO GROWTH 1 DAY 01/12/2016 1254   REPTSTATUS PENDING 01/12/2016 1254   REPTSTATUS PENDING 01/12/2016 1254   Mr Brain Wo Contrast  Result Date: 01/12/2016 CLINICAL DATA:  Altered mental status, extremity weakness and urinary retention after recent lumbar spine surgery. History of TIA, hypertension and diabetes. EXAM: MRI HEAD WITHOUT CONTRAST TECHNIQUE: Multiplanar, multiecho pulse sequences of the brain and surrounding structures were obtained without intravenous contrast. COMPARISON:  CT HEAD January 11, 2016 and MRI of the head December 24, 2015 FINDINGS: BRAIN: No reduced diffusion to suggest acute ischemia. No susceptibility  artifact to suggest hemorrhage (prior extra-axial susceptibility weighted artifact attributed to 3 tesla technique). The ventricles and sulci are normal for patient's age. No suspicious parenchymal signal, masses or mass effect. Minimal supratentorial and pontine white matter FLAIR T2 hyperintensities compatible with chronic small vessel ischemic disease, less than expected for age. No abnormal extra-axial fluid collections. No extra-axial masses though, contrast enhanced sequences would be more sensitive. VASCULAR: Normal major intracranial vascular flow voids present at skull base. SKULL AND UPPER CERVICAL SPINE: Fluid-filled expanded partially empty sella. No suspicious calvarial bone marrow signal. Craniocervical junction maintained. Trace LEFT temporomandibular joint effusion. SINUSES/ORBITS: The mastoid air-cells and included paranasal sinuses are well-aerated. Subcentimeter LEFT nasal mucosal retention cyst. The included ocular globes and orbital contents are non-suspicious. OTHER: None. IMPRESSION: No acute intracranial process. Partially empty sella, otherwise negative MRI of the head for age. Electronically Signed   By: Elon Alas M.D.   On: 01/12/2016 19:19   Mr Cervical Spine Wo Contrast  Result Date: 01/12/2016 CLINICAL DATA:  Recent lumbar surgery. Altered mental status. Bilateral lower extremity weakness and urinary retention. Bilateral upper extremity weakness. EXAM: MRI CERVICAL, THORACIC AND LUMBAR SPINE WITHOUT CONTRAST TECHNIQUE: Multiplanar and multiecho pulse sequences of the cervical spine, to include the craniocervical junction and cervicothoracic junction, and thoracic and lumbar spine, were obtained without intravenous contrast. COMPARISON:  Lumbar spine MRI 11/23/2015 FINDINGS: MRI CERVICAL SPINE FINDINGS Alignment: Normal Vertebrae: No acute compression fracture, facet edema or focal marrow lesion. Cord: Normal signal and caliber Posterior Fossa, vertebral arteries, paraspinal  tissues: Negative Disc levels: At C5-C6, there is a small disc osteophyte complex that narrows the ventral thecal sac and causes mild-to-moderate bilateral foraminal narrowing. Otherwise, there is no cervical spinal canal or foraminal stenosis. MRI THORACIC SPINE FINDINGS Alignment:  Normal Vertebrae: Suspected inferior endplate hemangioma at T7. Normal signal and caliber Paraspinal and other soft tissues: Negative Disc levels: No spinal canal or neural foraminal stenosis. MRI LUMBAR SPINE FINDINGS Segmentation:  Standard. Alignment:  Physiologic. Vertebrae: L3-L4 posterior spinal fusion with bilateral transpedicular screws. Conus medullaris: Extends to the L1 level and appears normal. No epidural collection. Paraspinal and other soft tissues: Postsurgical changes in the posterior paraspinal soft tissues with small fluid collection that has decreased in size compared to 11/23/2015. Disc levels: T12-L1: Left subarticular disc extrusion with inferior migration and narrowing of the left lateral recess. No central spinal canal stenosis. No neuroforaminal stenosis. L1-L2: Normal disc space and facet joints. No spinal canal stenosis. No neuroforaminal stenosis. L2-L3: Normal disc space and facet joints. No  spinal canal stenosis. No neuroforaminal stenosis. L3-L4: Posterior decompression and postfusion changes. No spinal canal stenosis. No neuroforaminal stenosis. L4-L5: Severe bilateral facet hypertrophy and small disc bulge. No spinal canal stenosis. Mild right neuroforaminal stenosis. L5-S1: Small disc bulge. Mild bilateral facet hypertrophy. No spinal canal stenosis. No neuroforaminal stenosis. IMPRESSION: 1. No cord compression, epidural abscess or hematoma or other finding to explain the reported acute symptoms. 2. Unchanged appearance of T12-L1 disc extrusion narrowing the left lateral recess. 3. Postfusion changes at L3-L4 without residual spinal canal stenosis. Electronically Signed   By: Ulyses Jarred M.D.   On:  01/12/2016 20:24   Mr Thoracic Spine Wo Contrast  Result Date: 01/12/2016 CLINICAL DATA:  Recent lumbar surgery. Altered mental status. Bilateral lower extremity weakness and urinary retention. Bilateral upper extremity weakness. EXAM: MRI CERVICAL, THORACIC AND LUMBAR SPINE WITHOUT CONTRAST TECHNIQUE: Multiplanar and multiecho pulse sequences of the cervical spine, to include the craniocervical junction and cervicothoracic junction, and thoracic and lumbar spine, were obtained without intravenous contrast. COMPARISON:  Lumbar spine MRI 11/23/2015 FINDINGS: MRI CERVICAL SPINE FINDINGS Alignment: Normal Vertebrae: No acute compression fracture, facet edema or focal marrow lesion. Cord: Normal signal and caliber Posterior Fossa, vertebral arteries, paraspinal tissues: Negative Disc levels: At C5-C6, there is a small disc osteophyte complex that narrows the ventral thecal sac and causes mild-to-moderate bilateral foraminal narrowing. Otherwise, there is no cervical spinal canal or foraminal stenosis. MRI THORACIC SPINE FINDINGS Alignment:  Normal Vertebrae: Suspected inferior endplate hemangioma at T7. Normal signal and caliber Paraspinal and other soft tissues: Negative Disc levels: No spinal canal or neural foraminal stenosis. MRI LUMBAR SPINE FINDINGS Segmentation:  Standard. Alignment:  Physiologic. Vertebrae: L3-L4 posterior spinal fusion with bilateral transpedicular screws. Conus medullaris: Extends to the L1 level and appears normal. No epidural collection. Paraspinal and other soft tissues: Postsurgical changes in the posterior paraspinal soft tissues with small fluid collection that has decreased in size compared to 11/23/2015. Disc levels: T12-L1: Left subarticular disc extrusion with inferior migration and narrowing of the left lateral recess. No central spinal canal stenosis. No neuroforaminal stenosis. L1-L2: Normal disc space and facet joints. No spinal canal stenosis. No neuroforaminal stenosis.  L2-L3: Normal disc space and facet joints. No spinal canal stenosis. No neuroforaminal stenosis. L3-L4: Posterior decompression and postfusion changes. No spinal canal stenosis. No neuroforaminal stenosis. L4-L5: Severe bilateral facet hypertrophy and small disc bulge. No spinal canal stenosis. Mild right neuroforaminal stenosis. L5-S1: Small disc bulge. Mild bilateral facet hypertrophy. No spinal canal stenosis. No neuroforaminal stenosis. IMPRESSION: 1. No cord compression, epidural abscess or hematoma or other finding to explain the reported acute symptoms. 2. Unchanged appearance of T12-L1 disc extrusion narrowing the left lateral recess. 3. Postfusion changes at L3-L4 without residual spinal canal stenosis. Electronically Signed   By: Ulyses Jarred M.D.   On: 01/12/2016 20:24   Mr Lumbar Spine Wo Contrast  Result Date: 01/12/2016 CLINICAL DATA:  Recent lumbar surgery. Altered mental status. Bilateral lower extremity weakness and urinary retention. Bilateral upper extremity weakness. EXAM: MRI CERVICAL, THORACIC AND LUMBAR SPINE WITHOUT CONTRAST TECHNIQUE: Multiplanar and multiecho pulse sequences of the cervical spine, to include the craniocervical junction and cervicothoracic junction, and thoracic and lumbar spine, were obtained without intravenous contrast. COMPARISON:  Lumbar spine MRI 11/23/2015 FINDINGS: MRI CERVICAL SPINE FINDINGS Alignment: Normal Vertebrae: No acute compression fracture, facet edema or focal marrow lesion. Cord: Normal signal and caliber Posterior Fossa, vertebral arteries, paraspinal tissues: Negative Disc levels: At C5-C6, there is a small disc osteophyte  complex that narrows the ventral thecal sac and causes mild-to-moderate bilateral foraminal narrowing. Otherwise, there is no cervical spinal canal or foraminal stenosis. MRI THORACIC SPINE FINDINGS Alignment:  Normal Vertebrae: Suspected inferior endplate hemangioma at T7. Normal signal and caliber Paraspinal and other soft  tissues: Negative Disc levels: No spinal canal or neural foraminal stenosis. MRI LUMBAR SPINE FINDINGS Segmentation:  Standard. Alignment:  Physiologic. Vertebrae: L3-L4 posterior spinal fusion with bilateral transpedicular screws. Conus medullaris: Extends to the L1 level and appears normal. No epidural collection. Paraspinal and other soft tissues: Postsurgical changes in the posterior paraspinal soft tissues with small fluid collection that has decreased in size compared to 11/23/2015. Disc levels: T12-L1: Left subarticular disc extrusion with inferior migration and narrowing of the left lateral recess. No central spinal canal stenosis. No neuroforaminal stenosis. L1-L2: Normal disc space and facet joints. No spinal canal stenosis. No neuroforaminal stenosis. L2-L3: Normal disc space and facet joints. No spinal canal stenosis. No neuroforaminal stenosis. L3-L4: Posterior decompression and postfusion changes. No spinal canal stenosis. No neuroforaminal stenosis. L4-L5: Severe bilateral facet hypertrophy and small disc bulge. No spinal canal stenosis. Mild right neuroforaminal stenosis. L5-S1: Small disc bulge. Mild bilateral facet hypertrophy. No spinal canal stenosis. No neuroforaminal stenosis. IMPRESSION: 1. No cord compression, epidural abscess or hematoma or other finding to explain the reported acute symptoms. 2. Unchanged appearance of T12-L1 disc extrusion narrowing the left lateral recess. 3. Postfusion changes at L3-L4 without residual spinal canal stenosis. Electronically Signed   By: Ulyses Jarred M.D.   On: 01/12/2016 20:24   Dg Chest Port 1 View  Result Date: 01/14/2016 CLINICAL DATA:  Decreased oxygen saturation on room air, intermittent fever, decreased level of consciousness. History of asthma, chronic renal insufficiency stage IV, NSTEMI. EXAM: PORTABLE CHEST 1 VIEW COMPARISON:  Chest x-ray of January 11, 2016 FINDINGS: The lungs are adequately inflated. There has been interval development  of alveolar opacity likely in the right middle lobe inferior to the minor fissure. The cardiac silhouette remains enlarged. The pulmonary vascularity is normal. The mediastinum is normal in width. The bony thorax is unremarkable. IMPRESSION: Subsegmental atelectasis or early pneumonia likely in the right middle lobe abutting the minor fissure. When the patient can tolerate the procedure, a PA and lateral chest x-ray would be useful. Mild cardiomegaly without evidence of pulmonary edema. Electronically Signed   By: David  Martinique M.D.   On: 01/14/2016 07:06   US Abdomen Limited Ruq  Result Date: 01/13/2016 CLINICAL DATA:  Elevated LFTs, history diabetes mellitus, hypertension, stage IV chronic kidney disease EXAM: US ABDOMEN LIMITED - RIGHT UPPER QUADRANT COMPARISON:  CT abdomen and pelvis 12/04/2011 FINDINGS: Gallbladder: Upper normal size. No gallstones identified. No wall thickening or pericholecystic fluid. Unable to assess for sonographic Murphy sign, patient unresponsive. Common bile duct: Diameter: Normal caliber 4 mm diameter. Liver: Degradation of image quality secondary to body habitus. No focal hepatic abnormalities are identified. Hepatopetal portal venous flow. No RIGHT upper quadrant free fluid. IMPRESSION: No sonographic abnormalities identified. Electronically Signed   By: Lavonia Dana M.D.   On: 01/13/2016 15:59   Recent Results (from the past 240 hour(s))  Culture, Urine     Status: Abnormal   Collection Time: 01/12/16  4:56 AM  Result Value Ref Range Status   Specimen Description URINE, RANDOM  Final   Special Requests NONE  Final   Culture 50,000 COLONIES/mL YEAST (A)  Final   Report Status 01/13/2016 FINAL  Final  Culture, blood (routine x 2)  Status: None (Preliminary result)   Collection Time: 01/12/16 12:54 PM  Result Value Ref Range Status   Specimen Description BLOOD LEFT ARM  Final   Special Requests IN PEDIATRIC BOTTLE 1.0 ML  Final   Culture NO GROWTH 1 DAY  Final    Report Status PENDING  Incomplete  Culture, blood (routine x 2)     Status: None (Preliminary result)   Collection Time: 01/12/16 12:54 PM  Result Value Ref Range Status   Specimen Description BLOOD LEFT ARM  Final   Special Requests IN PEDIATRIC BOTTLE 1ML  Final   Culture NO GROWTH 1 DAY  Final   Report Status PENDING  Incomplete      01/14/2016, 9:53 AM     LOS: 3 days    Records and images were personally reviewed where available.

## 2016-01-14 NOTE — Care Management Note (Addendum)
Case Management Note  Patient Details  Name: Orson Aloevon V Eversley MRN: [id number] Date of Birth: 1945/11/10  Subjective/Objective:    Transfer to SDU, 12/10, she developed worsening renal failure as well as new hepatic failure.  12/13, she developed acute hypoxic respiratory failure / MODS and was transferred to SDU for further monitoring , patient is for comfort care.                Action/Plan:   Expected Discharge Date:                  Expected Discharge Plan:     In-House Referral:     Discharge planning Services     Post Acute Care Choice:    Choice offered to:     DME Arranged:    DME Agency:     HH Arranged:    HH Agency:     Status of Service:     If discussed at [employer] Length of Stay Meetings, dates discussed:    Additional Comments:  Leone Havenaylor, Lorieann Argueta Clinton, RN 01/14/2016, 3:20 PM

## 2016-01-14 NOTE — Progress Notes (Signed)
Admit: 2015/12/24 LOS: 3  24F presenting with progressive AMS, weakness, debility after lumbar spinal surgery 11/2015 followed by AKI, NSTEMI; found to have sig azotemia, hyperkalemia, and likely UTI with ? Fungal component  Subjective:  LFTs worsened. INR nearly 8 on argatroban Renal function slightly worsened, no improvement with volume challenge Febrile overnight Pt not able to interact on exam GI eval for LFTs Spoke with son Ritchie over phone, discussed overall status, persistent severe renal failure and need for dialysis; he agrees and wishes to move ahead  12/12 0701 - 12/13 0700 In: 1059.9 [I.V.:759.9; IV Piggyback:300] Out: 0   ?850mL UOP reported  [facility]   20-Apr-2015 2044 01/13/16 2215  Weight: 97.5 kg (215 lb) 106 kg (233 lb 9.6 oz)    Scheduled Meds: . ampicillin (OMNIPEN) IV  2 g Intravenous Q24H  . cefTRIAXone (ROCEPHIN)  IV  2 g Intravenous Q12H  . chlorhexidine  15 mL Mouth Rinse BID  . insulin aspart  0-9 Units Subcutaneous TID WC  . insulin glargine  5 Units Subcutaneous Q2200  . mouth rinse  15 mL Mouth Rinse q12n4p  . sodium chloride flush  3 mL Intravenous Q12H   Continuous Infusions: . argatroban 1 mcg/kg/min (01/14/16 0418)  .  sodium bicarbonate  infusion 1000 mL 50 mL/hr at 01/13/16 1738   PRN Meds:.albuterol, bisacodyl, camphor-menthol **AND** hydrOXYzine, docusate sodium, metoprolol, ondansetron **OR** ondansetron (ZOFRAN) IV, sorbitol  Current Labs: reviewed    Physical Exam:  Blood pressure (!) 126/48, pulse 83, temperature 100.1 F (37.8 C), temperature source Axillary, resp. rate 18, height 5\' 3"  (1.6 m), weight 106 kg (233 lb 9.6 oz), SpO2 (!) 82 %. GEN: NAD, but sluggish and doesn't respond to questions very much; somnolent ENT: NCAT EYES: EOMI CV: RRR PULM: CTAB ABD: mild TTP in SP region, no RUQ Tenderness. +BS SKIN: no rashes/lesions EXT:No LEE  A 1. AoCKD 1. Prev admission with severe AKI req HD x1, recovered 2. SCr was  1.4-1.5 prior to admission 3. Renal US at admission was neg for acute structural issues, L 7cm? 4. UA with TNTC WBC, Turbid, +Hb, and yeast reported 5. Failed to respond to volume challenge 6. Likely ATN; check serologies given rapidity and this could be an RPGN (but I doubt) 2. Suspected UTI, perhaps fungal 3. AMS, Weakness 4. Hyperkalemia, mild, EKG w/o changes 5. Metabolic Acidosis, somewhat high AG likley related #1 6. Recent NSG lumbar spinal procedure, neck pain, ? LE weakness, and L urinary retention -- MRI brain + MRI Spine negative 12/11 7. Leukocytosis 8. 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 AFIb, was on NOAC; held 11. Recent NSTEMI 12. Recent TIA  P 1. CCM to eval for HD cath 2. Send off serologies to eval for vasculitis / RPGN 3. HD #1 today   Sabra Heckyan Robbyn Hodkinson MD 01/14/2016, 8:25 AM   Recent Labs Lab 01/12/16 1637 01/13/16 1010 01/14/16 0540  NA 134* 136 138  K 5.7* 5.0 5.0  CL 103 100* 100*  CO2 16* 18* 22  GLUCOSE 106* 128* 160*  BUN 158* 157* 159*  CREATININE 6.73* 6.91* 7.34*  CALCIUM 8.3* 8.0* 7.7*    Recent Labs Lab 01/12/16 0817 01/13/16 0444 01/14/16 0540  WBC 13.2* 13.5* 17.7*  HGB 10.6* 10.2* 10.8*  HCT 32.6* 31.5* 33.4*  MCV 85.1 86.1 85.9  PLT 286 259 255

## 2016-01-14 NOTE — Progress Notes (Signed)
Patient has a temp of 101.5, RN notified on call NP x2, awaiting orders. RN will continue to monitor patient.  Veatrice KellsMahmoud,Arnetia Bronk I, RN

## 2016-01-14 NOTE — Significant Event (Signed)
Rapid Response Event Note  Overview: Time Called: 0603 Arrival Time: 0605 Event Type: Respiratory  Initial Focused Assessment:   Called by Christel MormonZee, RN  For pt with increasing somnolence throughout the night, fever up to 101.5, O2 sats 82% on room air.  On arrival to room pt responds with moaning to sternal rub, follows commands to squeeze  With bilateral hands though extremely weak. No movement of lower extremities.  Bilateral BS rhonchus in upper fields, diminished in bilateral bases.  RR 18 BP 124/48 SR 84 on monitor.     Interventions: Placed on 4l [place] with O2 sats 100 %,   ABG : 7.40 PCO2 33, PO2 101 Stat PCXR results pending,    CBG 143   ice packs to axilla, Oral suctioning for pink frothy secretions, continuous pulse Ox  Plan of Care (if not transferred): Monitor respiratory status with continuous pulse ox,, oral suction as needed. Followup on CXR results  Event Summary: Name of Physician Notified: Donnamarie PoagK Kirby, NP (BY 6E RN Christel MormonZee) at [phone]    at    Outcome: Stayed in room and stabalized     Laurice Iglesia, [place] Slideraula K

## 2016-01-14 NOTE — Progress Notes (Signed)
Patient's O2 is 82 on room air. RN notified on call NP, Craige CottaKirby. RN awaiting orders. RN will continue to monitor patient.  Veatrice KellsMahmoud,Saralyn Willison I, RN

## 2016-01-15 DIAGNOSIS — I5032 Chronic diastolic (congestive) heart failure: Secondary | ICD-10-CM

## 2016-01-15 DIAGNOSIS — Z515 Encounter for palliative care: Secondary | ICD-10-CM

## 2016-01-15 LAB — MPO/PR-3 (ANCA) ANTIBODIES: ANCA Proteinase 3: <3.5 U/mL (ref 0.0–3.5)

## 2016-01-15 LAB — PREPARE FRESH FROZEN PLASMA
UNIT DIVISION: 0
Unit division: 0

## 2016-01-15 LAB — ANTI-DNA ANTIBODY, DOUBLE-STRANDED

## 2016-01-15 LAB — C4 COMPLEMENT: Complement C4, Body Fluid: 33 mg/dL (ref 14–44)

## 2016-01-15 LAB — HEPATITIS C ANTIBODY: HCV Ab: <0.1 s/co ratio (ref 0.0–0.9)

## 2016-01-15 LAB — HEPATITIS B E ANTIBODY: Hep B E Ab: NEGATIVE

## 2016-01-15 LAB — ANTISTREPTOLYSIN O TITER: ASO: <20 IU/mL (ref 0.0–200.0)

## 2016-01-15 LAB — HEPATITIS B E ANTIGEN: HEP B E AG: NEGATIVE

## 2016-01-15 LAB — ANTINUCLEAR ANTIBODIES, IFA: ANA Ab, IFA: NEGATIVE

## 2016-01-15 MED ORDER — ACETAMINOPHEN 650 MG RE SUPP
650.0000 mg | Freq: Four times a day (QID) | RECTAL | Status: DC | PRN
Start: 2016-01-15 — End: 2016-01-16
  Administered 2016-01-15: 650 mg via RECTAL
  Filled 2016-01-15: qty 1

## 2016-01-16 LAB — ANCA TITERS: C-ANCA: <1:20 {titer}

## 2016-01-17 LAB — CULTURE, BLOOD (ROUTINE X 2)
Culture: NO GROWTH
Culture: NO GROWTH

## 2016-01-19 ENCOUNTER — Ambulatory Visit: Payer: Medicare Other | Admitting: Interventional Cardiology

## 2016-01-19 LAB — CULTURE, BLOOD (ROUTINE X 2)
CULTURE: NO GROWTH
CULTURE: NO GROWTH

## 2016-02-02 NOTE — Discharge Summary (Addendum)
Death Summary      Heidi Aloevon V Marinez ZOX:[id number] DOB: 1945-04-07 DOA: Dec 10, 2015  PCP: Astrid DivineGRIFFIN,ELAINE COLLINS, MD  Admit date: Dec 10, 2015 Date of Death: 01/25/2016   Final Diagnoses:   Principal Problem:    Acute renal failure (ARF) (HCC) Active Problems:    Type 2 diabetes mellitus treated with insulin (HCC)    Essential hypertension    HIT (heparin-induced thrombocytopenia) (HCC)    Acute encephalopathy, multifactorial: hepatic and uremic    Current use of long term anticoagulation    Hyperkalemia    Elevated LFTs    Acute renal failure with acute tubular necrosis superimposed on stage 4 chronic kidney disease (HCC)    Acute cystitis without hematuria    Chronic diastolic CHF (congestive heart failure) (HCC)    Pulmonary hypertension, undetermined type    Acute respiratory failure with hypoxia (HCC)    Coagulopathy (HCC)    Hepatic steatosis    Suspected Sepsis (HCC), undetermined if present, cultures negative    Acute liver failure with hepatic coma (HCC)    End of life care   History of Present Illness:   Heidi Campbell is an 71 y.o. female with a PMH of uncontrolled type 2 diabetes with complications, hypertension, stage IV CKD, NSTEMI, multiple recent hospitalizations with placement at a SNF who returned to the hospital 28-Aug-2015 for evaluation of mental status changes.  Extensive review of her chart done as the patient is very complicated. She was initially admitted from 11/07/15-11/10/15 for neurosurgical decompression for degenerative spondylolisthesis with stenosis. She was rehospitalized 12/05/15-12/12/15 with acute renal failure requiring HD. She was noted to have a surgical wound infection at that time. MRI done at that time was without evidence of discitis or osteomyelitis and neurosurgery did not feel that there was any evidence of deep infection. She was treated with vancomycin for enterococcal UTI. Her discharge creatinine was 2.88 (6.09 on  admission). She then was rehospitalized 12/17/15-12/28/15 for evaluation of chest pain. She was found to have an NSTEMI with a troponin of 0.51. Creatinine was 2.63 on readmission. She was noted to be thrombocytopenic with platelet count of 51. 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 During her hospitalization, she also had a TIA with a negative MRI/MRA. 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 Discharge creatinine was 1.51. She was brought back to the hospital 28-Aug-2015 with mental status changes. Creatinine on admission was 6.06. She was also found to have marked elevation of her LFTs.   Hospital Course:   The patient was admitted and there were initial concerns for sepsis. She was placed on empiric ampicillin, vancomycin and Rocephin. Her surgical wounds did not appear grossly infected, blood cultures have been negative, and her urine culture grew only 50,000 colonies of yeast. The patient continued to deteriorate with worsening renal function progressing to respiratory failure from pulmonary edema and metabolic acidosis. She also developed acute liver failure of uncertain etiology. Both gastroenterology, critical care, and infectious disease consultants were called. After discussion with the critical care team and her family, the decision was made to transition to comfort care. Antibiotics were subsequently withdrawn and no further lab draws were undertaken. She was provided with morphine for comfort on an as-needed basis. The patient expired with family at the bedside.  Time of death: 6 PM  Signed:  Aveon Colquhoun  [facility] 01/14/2016, 6:41 PM

## 2016-02-02 NOTE — Care Management Important Message (Signed)
Important Message  Patient Details  Name: Orson Aloevon V Goodin MRN: [id number] Date of Birth: 1945/03/11   Medicare Important Message Given:  Yes    [name], Jurline Folger Abena 01/21/2016, 9:53 AM

## 2016-02-02 NOTE — Progress Notes (Addendum)
Pt passed at 1800, Dr Rama paged. Verified with Nutritional therapistWhitney RN.

## 2016-02-02 NOTE — Progress Notes (Signed)
Progress Note    Heidi Campbell  ONG:[id number] DOB: 18-Mar-1945  DOA: 01/18/2016 PCP: Astrid Divine, MD    Brief Narrative:   Chief complaint: Follow-up mental status changes.  Heidi Campbell is an 71 y.o. female with a PMH of uncontrolled type 2 diabetes with complications, hypertension, stage IV CKD, NSTEMI, multiple recent hospitalizations with placement at a SNF who returned to the hospital 01/14/2016 for evaluation of mental status changes.  Extensive review of her chart done as the patient is very complicated. She was initially admitted from 11/07/15-11/10/15 for neurosurgical decompression for degenerative spondylolisthesis with stenosis. She was rehospitalized 12/05/15-12/12/15 with acute renal failure requiring HD. She was noted to have a surgical wound infection at that time. MRI done at that time was without evidence of discitis or osteomyelitis and neurosurgery did not feel that there was any evidence of deep infection. She was treated with vancomycin for enterococcal UTI. Her discharge creatinine was 2.88 (6.09 on admission). She then was rehospitalized 12/17/15-12/28/15 for evaluation of chest pain. She was found to have an NSTEMI with a troponin of 0.51. Creatinine was 2.63 on readmission. She was noted to be thrombocytopenic with platelet count of 51. 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 During her hospitalization, she also had a TIA with a negative MRI/MRA. 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 Discharge creatinine was 1.51. She was brought back to the hospital 01/21/2016 with mental status changes. Creatinine on admission was 6.06. She was also found to have marked elevation of her LFTs.  Assessment/Plan:   Principal Problem:   End-of-life care Patient has now been transitioned to full comfort  measures. Morphine intermittently being given for pain or air hunger. All medications have been discontinued other than Tylenol, albuterol and morphine as needed.    Acute renal failure with acute tubular necrosis superimposed on stage 4 chronic kidney disease (HCC) The patient has had progressive renal failure. Initial plans were to proceed with HD however a family meeting was held and the family opted for comfort measures. No further lab checks.    Rule out sepsis  Given recent lumbar surgery, there is concern for sepsis. MRI of the brain and spine have not revealed a source of abscess or any evidence of infection however. Initially covered for meningitis with ampicillin, vancomycin and Rocephin. Antibiotics discontinued 01/14/16. Blood cultures negative to date. Urine cultures only grew 50,000 colonies of yeast.    Acute liver failure/ Elevated LFTs/coagulopathy/hepatic steatosis Right upper quadrant ultrasound done today for further evaluation, appeared normal. LFT elevation is in a hepatocellular pattern. Unable to perform contrasted CT of the abdomen at this time due to her renal failure. PT is markedly elevated at 69.5. Albumin is markedly low. LDH is 2323. Noncontrasted CT of the abdomen showed only hepatic steatosis. GI initiated workup. Antistreptolysin O titer WNL. Viral hepatitis studies negative. C4 complement WNL. Autoimmune workup pending.    Acute hypoxic respiratory failure Noted to have increased oxygen requirement 01/14/16, moved to the  SDU and critical care team consulted. Chest x-ray showed atelectasis versus early pneumonia of the right middle lobe. Critical care team held a family meeting and the patient was subsequently transitioned to comfort care.  Active Problems:   Type 2 diabetes mellitus treated with insulin (HCC) CBG checks and insulin discontinued in light of comfort measures.  Essential hypertension Blood pressure stable.    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    Acute encephalopathy Unclear if this is sepsis, hepatic encephalopathy or uremia or combination of all 3. Mental status continues to be altered..    Hyperkalemia Resolved. No further lab checks.    Chronic diastolic CHF (congestive heart failure) (HCC) Monitor volume status closely.    Pulmonary hypertension    Family Communication/Anticipated D/C date and plan/Code Status   DVT prophylaxis: None, comfort care. Code Status: DO NOT RESUSCITATE.  Family Communication: Son and 2 daughters updated at bedside.  Disposition Plan: Expect in hospital death.   Medical Consultants:    Nephrology  Gastroenterology  ID  Critical Care   Procedures:    None  Anti-Infectives:    Ampicillin 01/12/16--->01/14/16  Vancomycin 01/12/16---> 01/14/16  Rocephin 01/12/16---> 01/14/16  Subjective:   The patient Is now obtunded with a nonrebreather mask in place.  Objective:    Vitals:   01/14/16 1658 01/14/16 2000 01/14/16 2340 01/31/2016 0349  BP:  (!) 126/55 (!) 116/58 (!) 120/48  Pulse:  90 88 93  Resp:  20 (!) 21 (!) 26  Temp: (!) 100.5 F (38.1 C) (!) 100.4 F (38 C) 100 F (37.8 C) (!) 101.6 F (38.7 C)  TempSrc: Axillary Axillary Axillary Axillary  SpO2:  90% 93% 90%  Weight:      Height:        Intake/Output Summary (Last 24 hours) at 01/09/2016 0721 Last data filed at 01/30/2016 0357  Gross per 24 hour  Intake           725.83 ml  Output              702 ml  Net            23.83 ml   Filed Weights   January 31, 2016 2044 01/13/16 2215  Weight: 97.5 kg (215 lb) 106 kg (233 lb 9.6 oz)    Exam: General exam: Obtunded. Respiratory system: Congested in the upper airways, rhonchi throughout. Increased work of breathing. Cardiovascular system: S1 & S2 heard, RRR. No JVD,  rubs, gallops or clicks. No murmurs. Gastrointestinal system: Abdomen is nondistended, soft and  nontender. No organomegaly or masses felt. Normal bowel sounds heard. Central nervous system: Obtunded. Extremities: No clubbing,  or cyanosis. 1+ edema. Skin: 2 6 cm areas of skin breakdown, lower back and sacral area, wound base clean, no signs of invasive infection. Psychiatry: Unable to assess.   Data Reviewed:   I have personally reviewed following labs and imaging studies:  Labs: Basic Metabolic Panel:  Recent Labs Lab 01/31/2016 1615 01/12/16 0817 01/12/16 1637 01/13/16 1010 01/14/16 0540  NA 132* 132* 134* 136 138  K 5.5* 5.6* 5.7* 5.0 5.0  CL 101 101 103 100* 100*  CO2 17* 16* 16* 18* 22  GLUCOSE 167* 116* 106* 128* 160*  BUN 149* 158* 158* 157* 159*  CREATININE 6.06* 6.66* 6.73* 6.91* 7.34*  CALCIUM 8.4* 8.3* 8.3* 8.0* 7.7*   GFR Estimated Creatinine Clearance: 8.3 mL/min (by C-G formula based on SCr of 7.34 mg/dL (H)). Liver Function Tests:  Recent Labs Lab 2016/01/31 1615 01/12/16 0817 01/13/16 1010 01/14/16 0540  AST 917* 913* 1,133* 1,161*  ALT 453* 441* 513* 507*  ALKPHOS 117 109 121 114  BILITOT 0.5 0.8 0.5 0.6  PROT 6.2* 5.7* 5.7* 5.7*  ALBUMIN 2.3* 2.0* 2.0* 1.9*   No results for input(s): LIPASE, AMYLASE in the last 168  hours.  Recent Labs Lab 02/17/15 1615  AMMONIA 54*   CBC:  Recent Labs Lab 02/17/15 1615 01/12/16 0817 01/13/16 0444 01/14/16 0540  WBC 14.9* 13.2* 13.5* 17.7*  HGB 11.4* 10.6* 10.2* 10.8*  HCT 35.1* 32.6* 31.5* 33.4*  MCV 85.4 85.1 86.1 85.9  PLT 304 286 259 255   CBG:  Recent Labs Lab 01/13/16 1655 01/13/16 2212 01/14/16 0636 01/14/16 0759 01/14/16 1135  GLUCAP 122* 140* 143* 143* 160*   Microbiology Recent Results (from the past 240 hour(s))  Culture, Urine     Status: Abnormal   Collection Time: 01/12/16  4:56 AM  Result Value Ref Range Status   Specimen Description URINE, RANDOM  Final   Special Requests NONE  Final   Culture 50,000 COLONIES/mL YEAST (A)  Final   Report Status 01/13/2016 FINAL   Final  Culture, blood (routine x 2)     Status: None (Preliminary result)   Collection Time: 01/12/16 12:54 PM  Result Value Ref Range Status   Specimen Description BLOOD LEFT ARM  Final   Special Requests IN PEDIATRIC BOTTLE 1.0 ML  Final   Culture NO GROWTH 2 DAYS  Final   Report Status PENDING  Incomplete  Culture, blood (routine x 2)     Status: None (Preliminary result)   Collection Time: 01/12/16 12:54 PM  Result Value Ref Range Status   Specimen Description BLOOD LEFT ARM  Final   Special Requests IN PEDIATRIC BOTTLE 1ML  Final   Culture NO GROWTH 2 DAYS  Final   Report Status PENDING  Incomplete    Radiology: Ct Abdomen Wo Contrast  Result Date: 01/14/2016 CLINICAL DATA:  Elevated LFTs EXAM: CT ABDOMEN WITHOUT CONTRAST TECHNIQUE: Multidetector CT imaging of the abdomen was performed following the standard protocol without IV contrast. COMPARISON:  12/04/2011 FINDINGS: Lower chest: There is atelectasis of the right lower lobe. No pleural effusion identified. Hepatobiliary: Within the limitations of noncontrast technique there is no focal liver abnormality diffuse hepatic steatosis noted. Unremarkable appearance of the gallbladder. No biliary dilatation. Pancreas: Unremarkable. No pancreatic ductal dilatation or surrounding inflammatory changes. Spleen: Normal in size without focal abnormality. Adrenals/Urinary Tract: The adrenal glands are normal. No kidney stones or hydronephrosis. Stomach/Bowel: Stomach is within normal limits. Appendix appears normal. No evidence of bowel wall thickening, distention, or inflammatory changes. Vascular/Lymphatic: Aortic atherosclerosis. No aneurysm. No upper abdominal adenopathy. Other: There is no ascites or focal fluid collections within the abdomen or pelvis. 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 The patient is status post posterior fusion of L3-4. IMPRESSION: 1. No acute findings within the abdomen. 2.  Hepatic steatosis 3. Atelectasis of the right lower lobe. Electronically Signed   By: Signa Kellaylor  Stroud M.D.   On: 01/14/2016 09:54   Dg Chest Port 1 View  Result Date: 01/14/2016 CLINICAL DATA:  Decreased oxygen saturation on room air, intermittent fever, decreased level of consciousness. History of asthma, chronic renal insufficiency stage IV, NSTEMI. EXAM: PORTABLE CHEST 1 VIEW COMPARISON:  Chest x-ray of January 11, 2016 FINDINGS: The lungs are adequately inflated. There has been interval development of alveolar opacity likely in the right middle lobe inferior to the minor fissure. The cardiac silhouette remains enlarged. The pulmonary vascularity is normal. The mediastinum is normal in width. The bony thorax is unremarkable. IMPRESSION: Subsegmental atelectasis or early pneumonia likely in the right middle lobe abutting the minor fissure. When the patient can tolerate the procedure, a PA and lateral chest x-ray would be useful.  Mild cardiomegaly without evidence of pulmonary edema. Electronically Signed   By: David  SwazilandJordan M.D.   On: 01/14/2016 07:06   [place] Abdomen Limited Ruq  Result Date: 01/13/2016 CLINICAL DATA:  Elevated LFTs, history diabetes mellitus, hypertension, stage IV chronic kidney disease EXAM: US ABDOMEN LIMITED - RIGHT UPPER QUADRANT COMPARISON:  CT abdomen and pelvis 12/04/2011 FINDINGS: Gallbladder: Upper normal size. No gallstones identified. No wall thickening or pericholecystic fluid. Unable to assess for sonographic Murphy sign, patient unresponsive. Common bile duct: Diameter: Normal caliber 4 mm diameter. Liver: Degradation of image quality secondary to body habitus. No focal hepatic abnormalities are identified. Hepatopetal portal venous flow. No RIGHT upper quadrant free fluid. IMPRESSION: No sonographic abnormalities identified. Electronically Signed   By: Ulyses SouthwardMark  Boles M.D.   On: 01/13/2016 15:59    Medications:   . sodium chloride   Intravenous Once   Continuous  Infusions:   This is a time-based visit. I spent greater than 35 minutes at the patient's bedside with face-to-face interaction with the family who had multiple questions about her deterioration and overall prognosis.   LOS: 4 days   RAMA,CHRISTINA  [facility] Pager [phone]. If unable to reach me by pager, please call my cell phone at [phone].  *Please refer to [email or web address], password [email or web address] to get updated schedule on who will round on this patient, as hospitalists switch teams weekly. If 7PM-7AM, please contact night-coverage at [email or web address], password [email or web address] for any overnight needs.  2015-08-31, 7:21 AM

## 2016-02-02 DEATH — deceased

## 2018-05-25 IMAGING — MR MR LUMBAR SPINE WO/W CM
4 of 7 series · 18 of 48 positions shown · IV contrast (multihance)
Comparison: MRI lumbar spine October 11, 2015

CLINICAL DATA: Gradually worsening [DATE] low back pain radiating to
bilateral lower extremities for 2 days. Status post L3-4 PLIF
November 07, 2015.

EXAM:
MRI LUMBAR SPINE WITHOUT AND WITH CONTRAST
TECHNIQUE: Multiplanar and multiecho pulse sequences of the lumbar spine were
obtained without and with intravenous contrast.
CONTRAST:  20mL MULTIHANCE GADOBENATE DIMEGLUMINE 529 MG/ML IV SOLN

[Series 3: T2 · sagittal · 4.0mm · 0.55mm/px · 5 of 15 slices shown (1 of 2)]
[im 1/15]
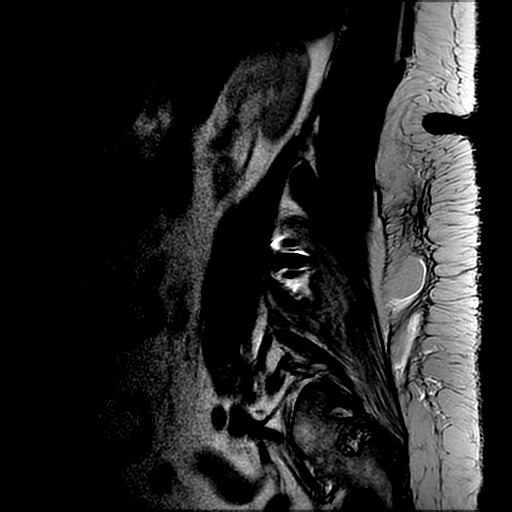
[im 4/15]
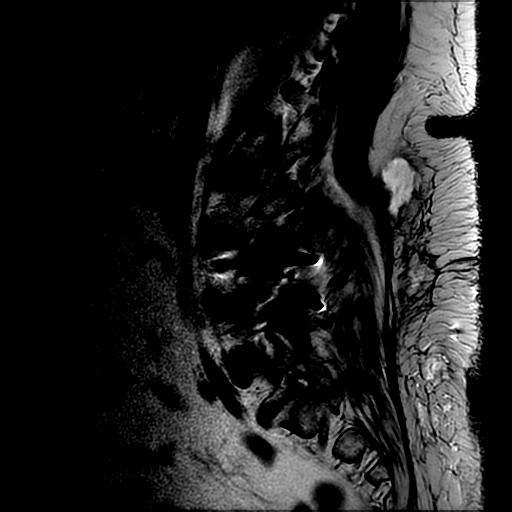
[im 8/15]
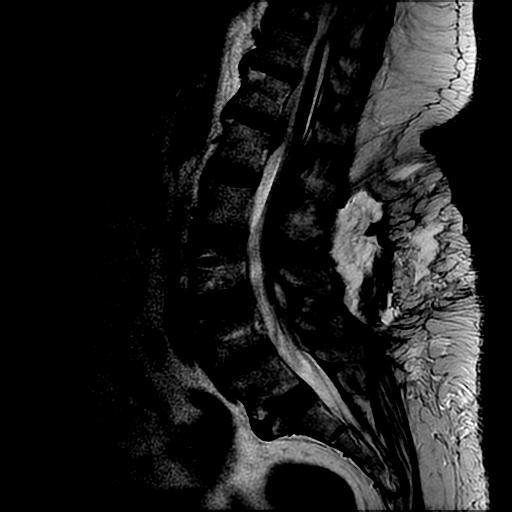
[im 11/15]
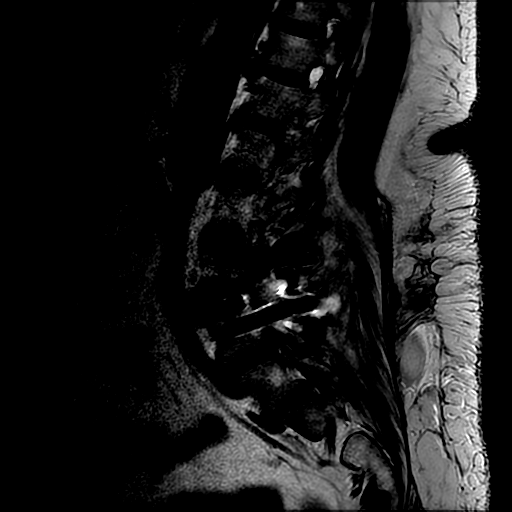
[im 15/15]
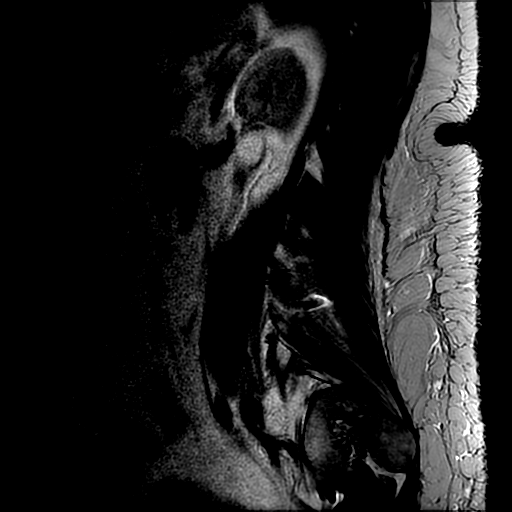

[Series 6: T2 · axial · 4.0mm · 0.39mm/px · z∈[-45,+158]mm · 7 of 38 slices shown (2 of 2)]
[im 1/38]
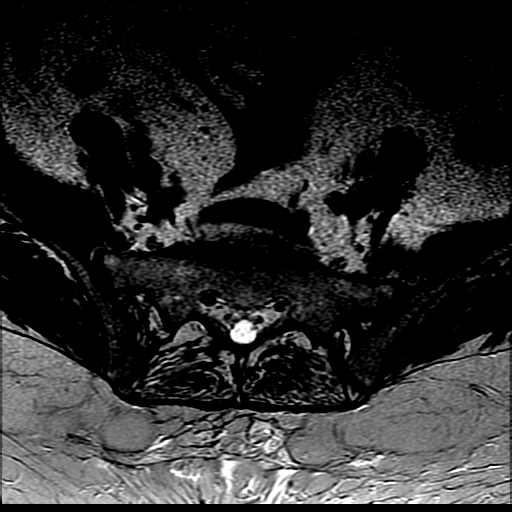
[im 5/38]
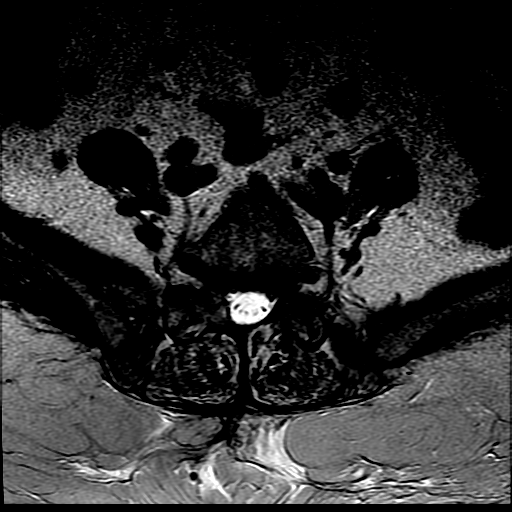
[im 13/38]
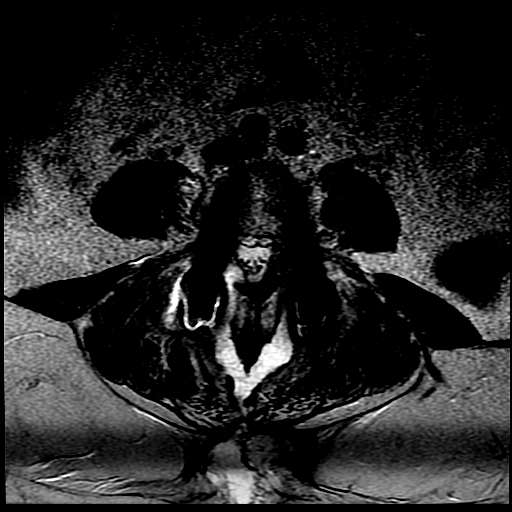
[im 17/38]
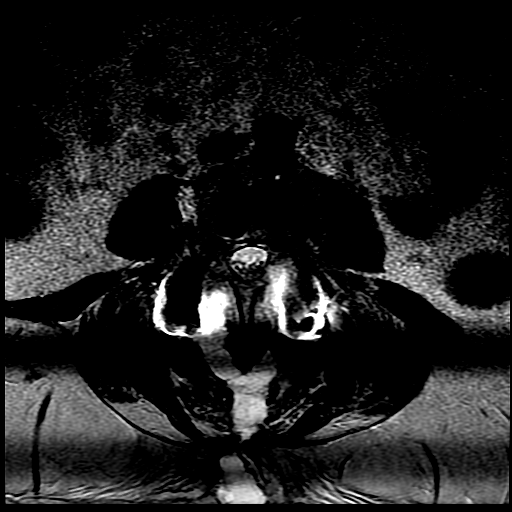
[im 21/38]
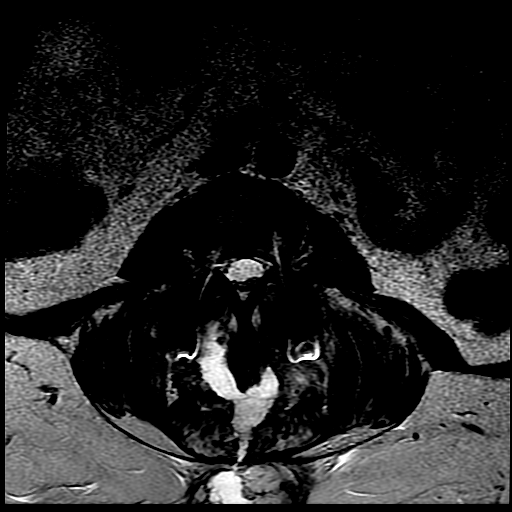
[im 25/38]
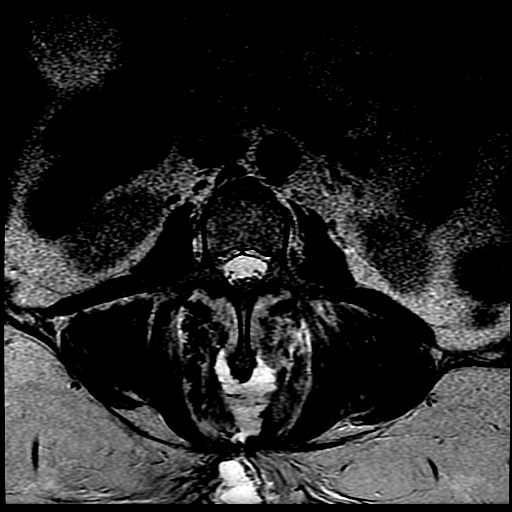
[im 33/38]
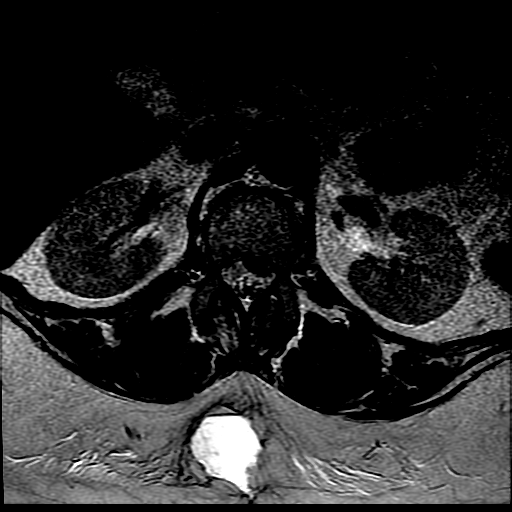

[Series 7: T1 · axial · 4.0mm · 0.39mm/px · z∈[-25,+158]mm · 3 of 38 slices shown (1 of 2)]
[im 5/38]
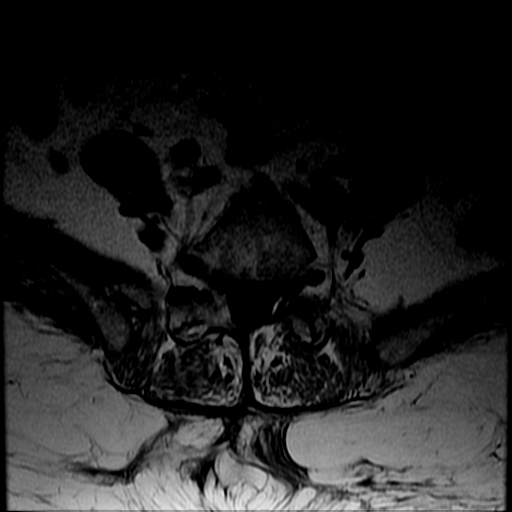
[im 21/38]
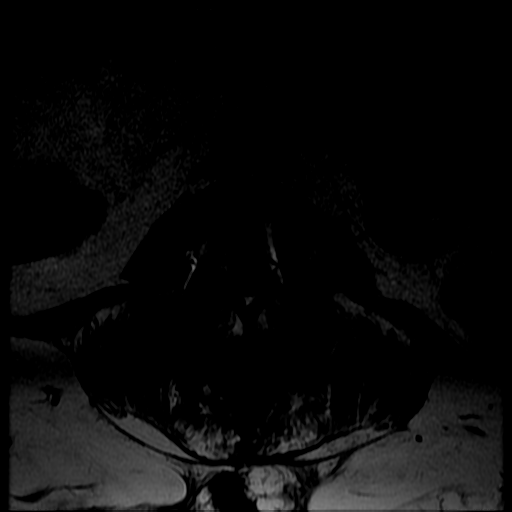
[im 33/38]
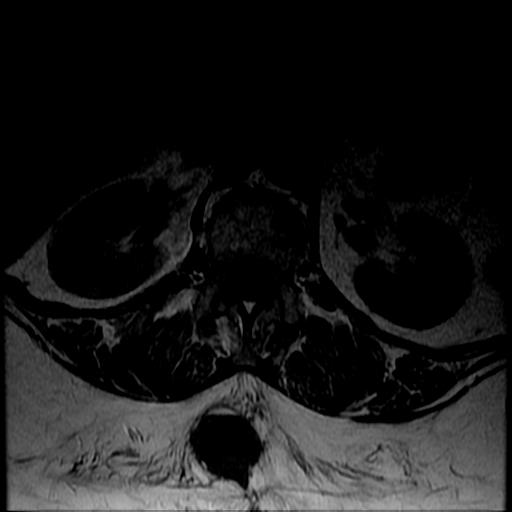

[Series 8: T1 · sagittal · 4.0mm · 0.55mm/px · 3 of 15 slices shown (2 of 2)]
[im 1/15]
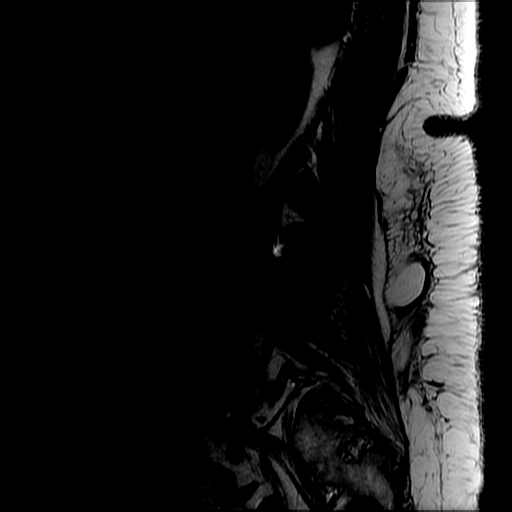
[im 10/15]
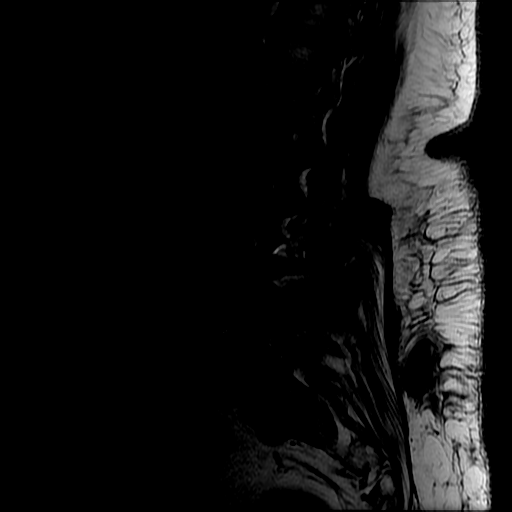
[im 15/15]
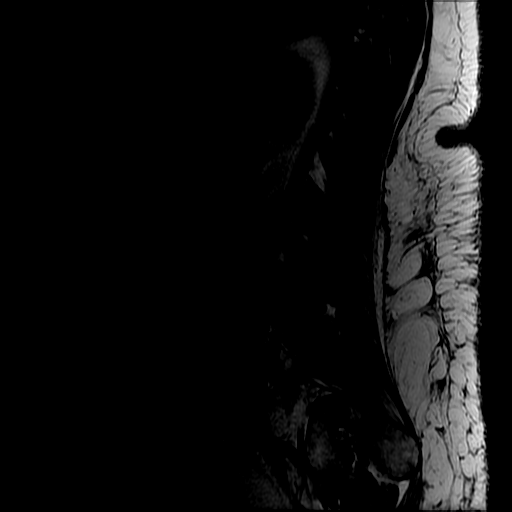

[18 of 48 positions shown; findings below may reference images not displayed]

FINDINGS: SEGMENTATION: For the purposes of this report, the last well-formed
intervertebral disc will be described as L5-S1.

ALIGNMENT: Maintenance of the lumbar lordosis. No malalignment.

VERTEBRAE:Status post L3-4 PLIF, pedicle screws result local
susceptibility artifact. Restored L3-4 disc height with disc
prosthesis. Stable moderate L5-S1 disc height loss and decreased T2
signal within the lumbar disc compatible with mild desiccation.
Moderate subacute on chronic discogenic endplate change L5-S1. No
suspicious osseous or intradiscal enhancement.

CONUS MEDULLARIS: Conus medullaris terminates at L1-2 and
demonstrates normal morphology and signal characteristics. Cauda
equina is normal. No abnormal cord, leptomeningeal or epidural
enhancement.

PARASPINAL AND SOFT TISSUES: 2.9 x 3.1 x 9.2 cm (transverse by AP by
CC) paraspinal fluid collection extending to the L3-4 surgical bed
with smooth and thin peripheral enhancement. Scattered fluid fluid
levels within the collection. Moderate symmetric paraspinal muscle
denervation below the the level of surgical intervention.

DISC LEVELS:

T12-L1: Chronic LEFT central broad-based disc extrusion measuring up
to 5 mm in AP dimension with contiguous 18 mm of superior and
inferior migration, minimally enhancing granulation tissue. Mild
ventral thecal sac effacement without canal stenosis. No neural
foraminal narrowing.

L1-2: Tiny central disc protrusion no canal stenosis or neural
foraminal narrowing.

L2-3: No disc bulge. Mild facet arthropathy without canal stenosis
or neural foraminal narrowing.

L3-4: PLIF, posterior decompression. Fluid collection extend to the
laminectomy surgical beds without thecal sac deformity. No canal
stenosis. Neural foramina predominately obscured.

L4-5: Small broad-based disc bulge. Moderate RIGHT and severe LEFT
facet arthropathy and ligamentum flavum redundancy. Nonenhancing
trace RIGHT facet effusion is likely reactive. No canal stenosis.
Moderate stable neural foraminal narrowing.

L5-S1: Small broad-based disc bulge. Severe RIGHT and moderate LEFT
facet arthropathy. RIGHT laminectomy suspected. No canal stenosis.
Moderate RIGHT and mild LEFT neural foraminal narrowing.
IMPRESSION: Status post recent L3-4 PLIF. 2.9 x 3.1 by IX 0.2 cm paraspinal
hematoma/seroma extending to surgical bed without convincing
evidence of infection.

Old suspected RIGHT L5-S1 laminectomy. Old T12-L1 moderate disc
extrusion.

No canal stenosis. Similar moderate L4-5 and L5-S1 neural foraminal
narrowing.

## 2018-05-26 IMAGING — CR DG CHEST 2V
2 series · 2 of 2 positions shown · non-contrast
Comparison: 07/26/2014

CLINICAL DATA: Fall 2 days ago. Left knee pain. Initial encounter.

EXAM:
CHEST  2 VIEW

[chest lat]
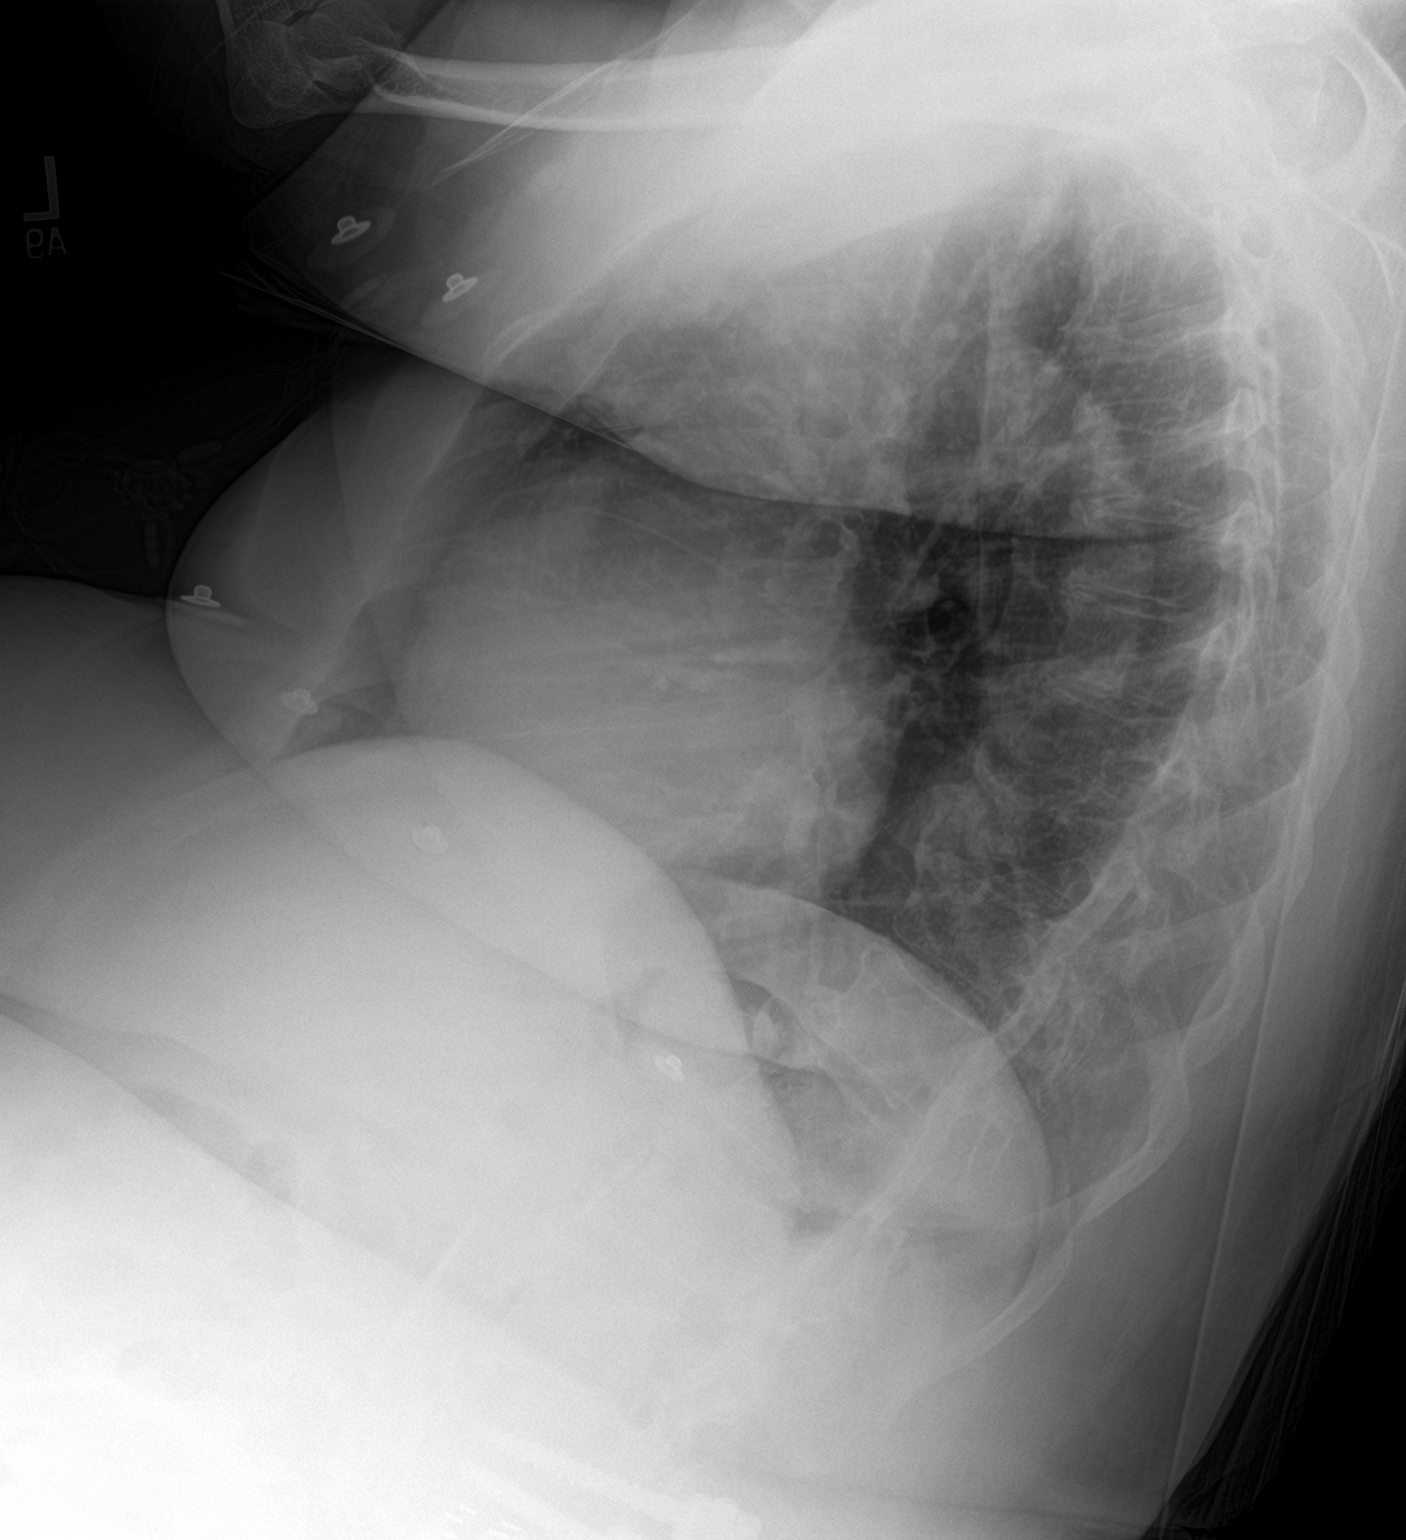

[chest ap]
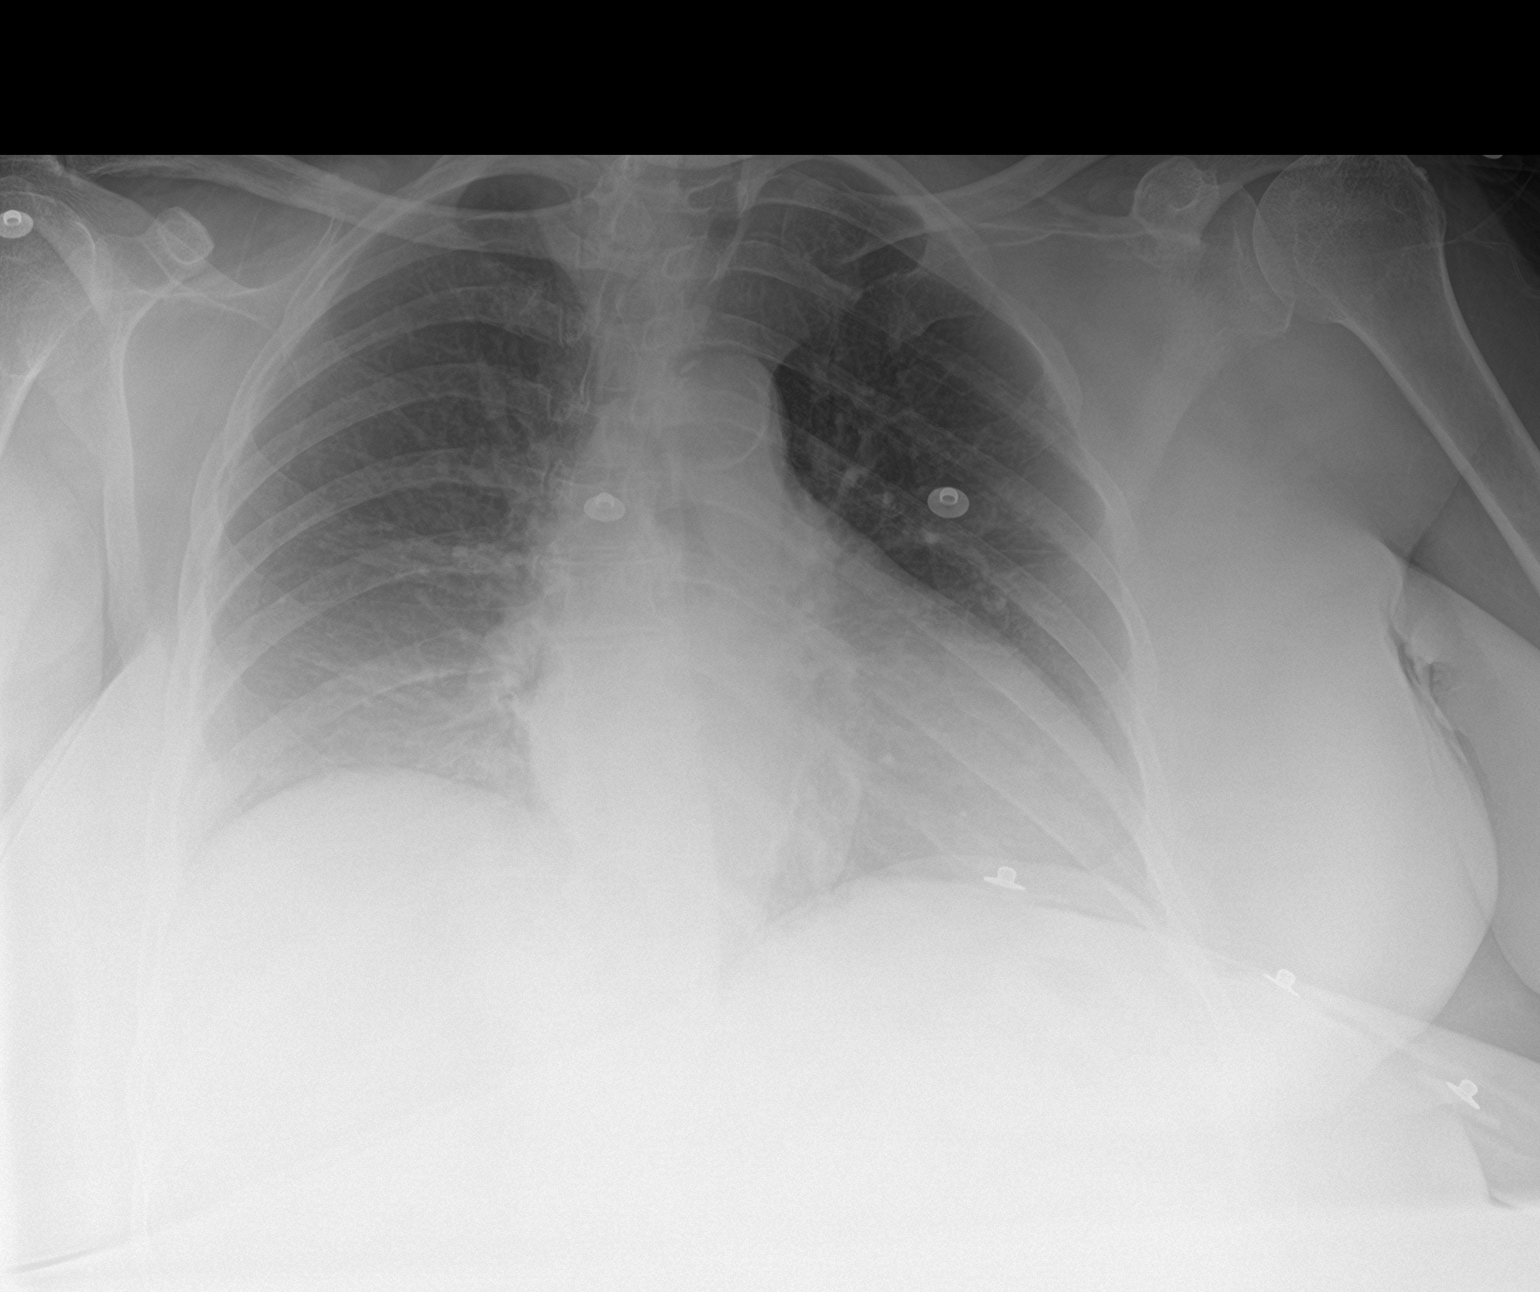

[2 of 2 positions shown; findings below may reference images not displayed]

FINDINGS: The cardiac silhouette is upper limits of normal in size. Aortic
atherosclerosis is noted. The patient has taken a shallower
inspiration than on the prior study. Hazy increased density in the
lung bases this attributed to overlying soft tissues. No focal
airspace opacity, edema, pleural effusion, or pneumothorax is
identified. No acute osseous abnormality is seen.
IMPRESSION: 1. No active cardiopulmonary disease.
2. Aortic atherosclerosis.
